# Patient Record
Sex: Female | Born: 1950 | Race: White | Hispanic: No | Marital: Married | Smoking: Never smoker
Health system: Southern US, Community
[De-identification: ages and names within clinical notes are randomized; demographics above are authoritative.]

## PROBLEM LIST (undated history)

## (undated) DIAGNOSIS — A048 Other specified bacterial intestinal infections: Secondary | ICD-10-CM

## (undated) DIAGNOSIS — R413 Other amnesia: Secondary | ICD-10-CM

## (undated) DIAGNOSIS — K449 Diaphragmatic hernia without obstruction or gangrene: Secondary | ICD-10-CM

## (undated) DIAGNOSIS — Z881 Allergy status to other antibiotic agents status: Secondary | ICD-10-CM

## (undated) DIAGNOSIS — E119 Type 2 diabetes mellitus without complications: Secondary | ICD-10-CM

## (undated) DIAGNOSIS — F329 Major depressive disorder, single episode, unspecified: Secondary | ICD-10-CM

## (undated) DIAGNOSIS — M40209 Unspecified kyphosis, site unspecified: Secondary | ICD-10-CM

## (undated) DIAGNOSIS — E785 Hyperlipidemia, unspecified: Secondary | ICD-10-CM

## (undated) DIAGNOSIS — H6500 Acute serous otitis media, unspecified ear: Secondary | ICD-10-CM

## (undated) DIAGNOSIS — F32A Depression, unspecified: Secondary | ICD-10-CM

## (undated) DIAGNOSIS — I10 Essential (primary) hypertension: Secondary | ICD-10-CM

## (undated) DIAGNOSIS — G5603 Carpal tunnel syndrome, bilateral upper limbs: Secondary | ICD-10-CM

## (undated) DIAGNOSIS — K219 Gastro-esophageal reflux disease without esophagitis: Secondary | ICD-10-CM

## (undated) DIAGNOSIS — Z86018 Personal history of other benign neoplasm: Secondary | ICD-10-CM

## (undated) DIAGNOSIS — L309 Dermatitis, unspecified: Secondary | ICD-10-CM

## (undated) DIAGNOSIS — A630 Anogenital (venereal) warts: Secondary | ICD-10-CM

## (undated) DIAGNOSIS — Z780 Asymptomatic menopausal state: Secondary | ICD-10-CM

## (undated) DIAGNOSIS — D3500 Benign neoplasm of unspecified adrenal gland: Secondary | ICD-10-CM

## (undated) DIAGNOSIS — E8881 Metabolic syndrome: Secondary | ICD-10-CM

## (undated) DIAGNOSIS — G4733 Obstructive sleep apnea (adult) (pediatric): Secondary | ICD-10-CM

## (undated) DIAGNOSIS — K589 Irritable bowel syndrome without diarrhea: Secondary | ICD-10-CM

## (undated) DIAGNOSIS — N951 Menopausal and female climacteric states: Secondary | ICD-10-CM

## (undated) DIAGNOSIS — Z8701 Personal history of pneumonia (recurrent): Secondary | ICD-10-CM

## (undated) DIAGNOSIS — K648 Other hemorrhoids: Secondary | ICD-10-CM

## (undated) DIAGNOSIS — J45909 Unspecified asthma, uncomplicated: Secondary | ICD-10-CM

## (undated) DIAGNOSIS — K5790 Diverticulosis of intestine, part unspecified, without perforation or abscess without bleeding: Secondary | ICD-10-CM

## (undated) DIAGNOSIS — F419 Anxiety disorder, unspecified: Secondary | ICD-10-CM

## (undated) DIAGNOSIS — G4730 Sleep apnea, unspecified: Secondary | ICD-10-CM

## (undated) HISTORY — DX: Allergy status to other antibiotic agents: Z88.1

## (undated) HISTORY — DX: Other hemorrhoids: K64.8

## (undated) HISTORY — PX: INCISIONAL HERNIA REPAIR: SHX193

## (undated) HISTORY — DX: Hyperlipidemia, unspecified: E78.5

## (undated) HISTORY — DX: Metabolic syndrome: E88.81

## (undated) HISTORY — DX: Unspecified asthma, uncomplicated: J45.909

## (undated) HISTORY — DX: Personal history of other benign neoplasm: Z86.018

## (undated) HISTORY — DX: Anxiety disorder, unspecified: F41.9

## (undated) HISTORY — DX: Other specified bacterial intestinal infections: A04.8

## (undated) HISTORY — DX: Metabolic syndrome: E88.810

## (undated) HISTORY — DX: Asymptomatic menopausal state: Z78.0

## (undated) HISTORY — DX: Dermatitis, unspecified: L30.9

## (undated) HISTORY — DX: Other amnesia: R41.3

## (undated) HISTORY — PX: CARPAL TUNNEL RELEASE: SHX101

## (undated) HISTORY — DX: Type 2 diabetes mellitus without complications: E11.9

## (undated) HISTORY — DX: Irritable bowel syndrome, unspecified: K58.9

## (undated) HISTORY — DX: Unspecified kyphosis, site unspecified: M40.209

## (undated) HISTORY — DX: Essential (primary) hypertension: I10

## (undated) HISTORY — DX: Diverticulosis of intestine, part unspecified, without perforation or abscess without bleeding: K57.90

## (undated) HISTORY — DX: Anogenital (venereal) warts: A63.0

## (undated) HISTORY — PX: OTHER SURGICAL HISTORY: SHX169

## (undated) HISTORY — DX: Obstructive sleep apnea (adult) (pediatric): G47.33

## (undated) HISTORY — DX: Menopausal and female climacteric states: N95.1

## (undated) HISTORY — DX: Sleep apnea, unspecified: G47.30

## (undated) HISTORY — PX: COLONOSCOPY: SHX174

## (undated) HISTORY — DX: Personal history of pneumonia (recurrent): Z87.01

## (undated) HISTORY — PX: APPENDECTOMY: SHX54

## (undated) HISTORY — DX: Acute serous otitis media, unspecified ear: H65.00

## (undated) HISTORY — DX: Gastro-esophageal reflux disease without esophagitis: K21.9

## (undated) HISTORY — DX: Carpal tunnel syndrome, bilateral upper limbs: G56.03

## (undated) HISTORY — DX: Major depressive disorder, single episode, unspecified: F32.9

## (undated) HISTORY — DX: Diaphragmatic hernia without obstruction or gangrene: K44.9

## (undated) HISTORY — DX: Benign neoplasm of unspecified adrenal gland: D35.00

## (undated) HISTORY — DX: Depression, unspecified: F32.A

---

## 1993-07-13 HISTORY — PX: TOTAL ABDOMINAL HYSTERECTOMY W/ BILATERAL SALPINGOOPHORECTOMY: SHX83

## 1993-11-16 DIAGNOSIS — A048 Other specified bacterial intestinal infections: Secondary | ICD-10-CM

## 1993-11-16 HISTORY — DX: Other specified bacterial intestinal infections: A04.8

## 1995-04-13 HISTORY — PX: OTHER SURGICAL HISTORY: SHX169

## 1995-09-13 HISTORY — PX: OTHER SURGICAL HISTORY: SHX169

## 1999-05-04 ENCOUNTER — Ambulatory Visit (HOSPITAL_COMMUNITY): Admission: RE | Admit: 1999-05-04 | Discharge: 1999-05-04 | Payer: Self-pay | Admitting: Internal Medicine

## 1999-05-04 ENCOUNTER — Encounter: Payer: Self-pay | Admitting: Internal Medicine

## 2000-06-09 ENCOUNTER — Encounter: Admission: RE | Admit: 2000-06-09 | Discharge: 2000-09-07 | Payer: Self-pay | Admitting: *Deleted

## 2000-10-31 ENCOUNTER — Encounter: Payer: Self-pay | Admitting: Family Medicine

## 2000-10-31 ENCOUNTER — Ambulatory Visit (HOSPITAL_COMMUNITY): Admission: RE | Admit: 2000-10-31 | Discharge: 2000-10-31 | Payer: Self-pay | Admitting: Family Medicine

## 2000-12-08 ENCOUNTER — Encounter: Payer: Self-pay | Admitting: Specialist

## 2000-12-13 HISTORY — PX: ROTATOR CUFF REPAIR: SHX139

## 2000-12-14 ENCOUNTER — Observation Stay (HOSPITAL_COMMUNITY): Admission: RE | Admit: 2000-12-14 | Discharge: 2000-12-15 | Payer: Self-pay | Admitting: Specialist

## 2000-12-29 ENCOUNTER — Encounter: Admission: RE | Admit: 2000-12-29 | Discharge: 2001-02-23 | Payer: Self-pay | Admitting: Specialist

## 2002-05-10 ENCOUNTER — Encounter: Payer: Self-pay | Admitting: Family Medicine

## 2002-05-10 ENCOUNTER — Ambulatory Visit (HOSPITAL_COMMUNITY): Admission: RE | Admit: 2002-05-10 | Discharge: 2002-05-10 | Payer: Self-pay | Admitting: Family Medicine

## 2003-02-26 ENCOUNTER — Ambulatory Visit (HOSPITAL_COMMUNITY): Admission: RE | Admit: 2003-02-26 | Discharge: 2003-02-26 | Payer: Self-pay | Admitting: Orthopedic Surgery

## 2003-02-26 ENCOUNTER — Encounter: Payer: Self-pay | Admitting: Orthopedic Surgery

## 2003-05-17 ENCOUNTER — Ambulatory Visit (HOSPITAL_BASED_OUTPATIENT_CLINIC_OR_DEPARTMENT_OTHER): Admission: RE | Admit: 2003-05-17 | Discharge: 2003-05-17 | Payer: Self-pay | Admitting: Family Medicine

## 2003-07-30 ENCOUNTER — Encounter: Payer: Self-pay | Admitting: Family Medicine

## 2003-07-30 ENCOUNTER — Encounter: Admission: RE | Admit: 2003-07-30 | Discharge: 2003-07-30 | Payer: Self-pay | Admitting: Family Medicine

## 2005-12-13 DIAGNOSIS — Z86018 Personal history of other benign neoplasm: Secondary | ICD-10-CM

## 2005-12-13 HISTORY — DX: Personal history of other benign neoplasm: Z86.018

## 2006-05-13 HISTORY — PX: OTHER SURGICAL HISTORY: SHX169

## 2006-07-07 ENCOUNTER — Ambulatory Visit: Payer: Self-pay | Admitting: Gastroenterology

## 2006-07-18 ENCOUNTER — Ambulatory Visit: Payer: Self-pay | Admitting: Gastroenterology

## 2006-09-28 ENCOUNTER — Ambulatory Visit (HOSPITAL_BASED_OUTPATIENT_CLINIC_OR_DEPARTMENT_OTHER): Admission: RE | Admit: 2006-09-28 | Discharge: 2006-09-28 | Payer: Self-pay | Admitting: Orthopedic Surgery

## 2009-08-27 ENCOUNTER — Ambulatory Visit (HOSPITAL_COMMUNITY): Admission: RE | Admit: 2009-08-27 | Discharge: 2009-08-28 | Payer: Self-pay | Admitting: Specialist

## 2009-08-30 HISTORY — PX: OTHER SURGICAL HISTORY: SHX169

## 2009-09-15 ENCOUNTER — Encounter: Admission: RE | Admit: 2009-09-15 | Discharge: 2009-12-11 | Payer: Self-pay | Admitting: Specialist

## 2010-02-06 ENCOUNTER — Encounter: Admission: RE | Admit: 2010-02-06 | Discharge: 2010-02-06 | Payer: Self-pay | Admitting: Orthopedic Surgery

## 2010-02-25 ENCOUNTER — Ambulatory Visit (HOSPITAL_COMMUNITY): Admission: RE | Admit: 2010-02-25 | Discharge: 2010-02-25 | Payer: Self-pay | Admitting: Orthopedic Surgery

## 2011-03-19 LAB — BASIC METABOLIC PANEL
Calcium: 9.2 mg/dL (ref 8.4–10.5)
Chloride: 105 mEq/L (ref 96–112)
Creatinine, Ser: 0.48 mg/dL (ref 0.4–1.2)
GFR calc non Af Amer: >60 mL/min (ref >60)
Sodium: 141 mEq/L (ref 135–145)

## 2011-03-19 LAB — HEPATIC FUNCTION PANEL
Alkaline Phosphatase: 49 U/L (ref 39–117)
Bilirubin, Direct: 0.1 mg/dL (ref 0.0–0.3)
Total Bilirubin: 0.7 mg/dL (ref 0.3–1.2)
Total Protein: 6.4 g/dL (ref 6.0–8.3)

## 2011-03-19 LAB — CBC
HCT: 38.5 % (ref 36.0–46.0)
Hemoglobin: 13.6 g/dL (ref 12.0–15.0)
Platelets: 238 10*3/uL (ref 150–400)
WBC: 7.7 10*3/uL (ref 4.0–10.5)

## 2011-04-21 ENCOUNTER — Encounter: Payer: Self-pay | Admitting: Family Medicine

## 2011-04-30 NOTE — H&P (Signed)
[id number] [facility]  Patient:    Melissa Bowman, Melissa Bowman                        MRN: [id number] Adm. Date:  12/14/00 Attending:  Javier Bowman, M.D. Dictator:   Melissa [facility], P.A.C. CC:         Melissa Bowman, M.D., [facility]  Melissa Bowman, M.D., Melissa [facility]   History and Physical  CHIEF COMPLAINT:  Right shoulder pain.  HISTORY OF PRESENT ILLNESS:  [name] is a 60 year old right-hand-dominant female who was referred to our office by Dr. Montey Bowman and Dr. Monica Bowman from [facility] for evaluation of her right rotator cuff tear.  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 She immediately had acute pain in her right shoulder.  She was seen by Dr. Dimple Bowman.  Her regular doctor is Dr. Christell Bowman.  An MRI was obtained in the office and confirmed a tear.  She was placed on Tylenol No. 3 and Celebrex.  She has had increasing pain with any type of activity with the shoulder.  She was seen in our office on November 08, 2000 by Dr. Javier Bowman, and on inspection, revealed no ecchymosis or deformity of the shoulder.  She did have tenderness in the anterior subacromial region and minimally tender over the [facility].  She had a positive impingement sign.  She had diminished internal rotation and weakness in external rotation.  The MRI demonstrated a full-thickness tear of the supraspinatus tendon.  Predisposing factor appears to be acromial anatomy. The injury is interfering with her activities of daily living.  She has been unable to work and she has not been responsive to Tylenol No. 3 or the Celebrex.  At the time of her appointment, it was felt that she should be of benefit to undergo the open rotator cuff repair.  The risks and benefits of the surgery were discussed including bleeding, infection, damage to neurovascular structures,  no change in symptoms, suboptimal range of motion, protracted need for immobilization and postoperative rehabilitation.  The patient indicated that she understood and agreed to proceed with the surgery.  PAST MEDICAL HISTORY:  Past medical history is significant for: Gastroesophageal reflux disease; asthma, controlled with inhalers; sleep apnea, underwent a uvulectomy and uses a CPAP at night; osteoporosis; hypertension controlled with medications; history of migraine headaches; irritable bowel syndrome; seasonal allergies.  SURGICAL HISTORY:  C-sections in 1982, 1985; appendectomy, 1993; total abdominal hysterectomy, 1994; right herniorrhaphy; uvulectomy.  MEDICATIONS:  Melissa Bowman, Melissa [facility]man, Melissa Bowman, Melissa Bowman, Melissa Bowman, Melissa Bowman, Melissa Bowman.  ALLERGIES:  No known drug allergies.  SOCIAL HISTORY:  The patient is married.  She has two children.  She works at [facility].  Negative for history of alcohol.  Negative history of smoking.  FAMILY HISTORY:  Mother deceased, age 12 -- heart disease, osteoporosis. Father deceased, 9 -- Parkinsons disease.  REVIEW OF SYSTEMS:  Positive HEENT -- has had a recent sinus infection and is currently finishing a round of [facility].  Positive history of sleep apnea, utilizes a CPAP machine at night.  No changes in vision or hearing.  No otorrhea.  No blurriness, teariness.  No rhinorrhea, nasal stuffiness.  No epistaxis.  CARDIOVASCULAR:  Hypertension, well-controlled with medications. No history of heart murmurs.  No angina, palpitations, dyspnea on exertion or orthopnea.  PULMONARY:  Positive  for occasional wheezing due to asthma.  No coughing, sputum, hemoptysis, pneumonia or emphysema.  ENDOCRINE:  No polyuria, polyphagia, polydipsia.  No history of diabetes mellitus or hypothyroid or hyperthyroidism.  GI:  Positive history for internal hemorrhoids and irritable bowel.  No hematemesis.  No hematochezia.   No melena.  No constipation or diarrhea.  GENITOURINARY:  No history of dysuria, frequency, hesitancy, urgency.  No nocturia or incontinence.  No stones.  No recent infections.  CNS:  Positive history of occasional migraine headaches. No vertigo.  No history of seizures or stroke.  MUSCULOSKELETAL:  No history of muscle weakness.  Does have decreased range of motion to the right shoulder, as stated above.  No redness or swelling.  No history of gout.  PHYSICAL EXAMINATION  VITALS:  Blood pressure 110/80.  Pulse is 80.  Respirations 20.  GENERAL:  [age]-year-old white female, well-developed, well-nourished, in no acute distress.  HEENT:  Oropharynx is clear, nonerythematous.  Absent uvula noted.  PERRLA.  NECK:  Supple.  Negative for carotid bruits.  CHEST:  Lungs are clear to auscultation bilaterally.  No wheezes, rhonchi or rales noted.  BREASTS:  Not pertinent to present illness.  GENITOURINARY:  Not pertinent to present illness.  HEART:  S1, S2.  Negative for murmur, rub or gallop.  Heart is regular in rate and rhythm.  ABDOMEN:  Soft and supple, nontender.  Positive bowel sounds.  Abdomen is somewhat round.  EXTREMITIES:  Radial pulses 2+ and symmetrical.  For physical exam of the right shoulder, please see history of present illness.  SKIN:  Acyanotic with no lesions noted.  No clubbing noted.  No nail pitting or stippling noticed.  LABORATORY AND X-RAY FINDINGS:  Preop labs are pending at this time.  IMPRESSION 1. Right shoulder rotator cuff tear. 2. Reflux. 3. Asthma. 4. Sleep apnea. 5. Osteoporosis. 6. Hypertension. 7. History of migraine headaches. 8. Irritable bowel.  PLAN:  Patient is scheduled for open right rotator cuff repair with Dr. Jene Bowman on December 14, 2000 at [facility]. DD:  12/08/00 TD:  12/08/00 Job: [id number]

## 2011-04-30 NOTE — Op Note (Signed)
[facility]  Patient:    Melissa Bowman, Melissa Bowman                        MRN: [id number] Proc. Date: 12/14/00 Adm. Date:  [id number] Attending:  Pierce Crane                           Operative Report  PREOPERATIVE DIAGNOSIS:  Right rotator cuff tear, impingement syndrome.  POSTOPERATIVE DIAGNOSIS:  Right rotator cuff tear, impingement syndrome.  PROCEDURE:  Open rotator cuff repair, acromioplasty, synovectomy.  ANESTHESIA:  General.  ASSISTANT:  Della Goo, P.A.  BRIEF HISTORY AND INDICATIONS:  A 60 year old with acute rotator cuff tear following a work-related injury.  An MRI indicated a tear.  Operative intervention was indicated for decompression and repair of the rotator cuff. Risks and benefits discussed including bleeding, infection, damage to vascular structures, recurrent tear, and suboptimal range of motion, etc.  TECHNIQUE:  The patient is in supine position.  After the induction of adequate anesthesia, 1 gram of Kefzol for antimicrobial prophylaxis, the right shoulder and upper extremity was prepped and draped in the usual sterile fashion.  Surgical marker was utilized to delineate the acromion and clavicle. Incision was made over the anterior aspect of the acromion in ______ lines. Subcutaneous tissue was dissected and electrocautery utilized to achieve hemostasis.  Marcaine 0.25% with epinephrine infiltrated in the subcutaneous tissue.  Raphe between the anterior and lateral heads of the deltoid was identified and divided, superiorly periosteally elevated from the acromion laterally and medially to the [facility] joint.  The [facility] ligament was detached and excised.  Raphe was developed.  Bursa was noted and excised.  Copiously irrigated.  A large, nearly full thickness tear of the rotator cuff was noted in the supraspinatus tendon.  This measured approximately 1.5 cm in length. The edges were debrided to good bleeding tissue.  There was noted  attachment with insertion, approximately 1/2 cm from this insertion.  Longitudinal direction.  The rotator cuff was repaired with #1 Vicryl interrupted figure-of-eight sutures.  A total of six were utilized.  Wound copiously irrigated.  Prior to this, acromioplasty was performed utilizing oscillating saw, removing approximately 3 mm of inferior surface and 3 mm of the anterior surface.  This removed any residual impingement.  Next, wound copiously irrigated.  Internal and external rotation with no evidence of additional tear.  Excellent repair was noted.  No tension noted. Next, the raphe was repaired with #1 Vicryl interrupted figure-of-eight sutures.  The deltoid was repaired to the acromion with #1 Vicryl interrupted figure-of-eight sutures.  Excellent repair of the raphe of the deltoid was noted.  Good range without tensions on the wound site.  Prior to this, the shoulder was ranged.  There was no residual tension.  Next, subcutaneous tissue reapproximated with 2-0 Vicryl simple sutures.  Skin was reapproximated with 3-0 subcuticular and reinforced with Steri-Strips and sterile dressing applied.  Abduction pillow in place.  The patient was extubated without difficulty and transported to recovery room in satisfactory condition.  The patient tolerated the procedure well without complications. DD:  12/14/00 TD:  12/14/00 Job: [id number]

## 2011-04-30 NOTE — Op Note (Signed)
[name], Melissa Bowman               ACCOUNT NO.:  [id number]   MEDICAL RECORD NO.:  [id number]          PATIENT TYPE:  AMB   LOCATION:  DSC                          FACILITY:  [facility]   PHYSICIAN:  Mila Homer. Sherlean Foot, M.D. DATE OF BIRTH:  [birth year]   DATE OF PROCEDURE:  09/28/2006  DATE OF DISCHARGE:                                 OPERATIVE REPORT   SURGEON:  Mila Homer. Sherlean Foot, M.D.   ASSISTANT:  None.   PREOPERATIVE DIAGNOSES:  Right knee osteoarthritis, medial meniscus tear.   POSTOPERATIVE DIAGNOSES:  Right knee osteoarthritis, medial meniscus tear.   PROCEDURES:  Right knee arthroscopy with partial medial meniscectomy,  chondroplasty in the medial and patellofemoral compartments.   ANESTHESIA:  MAC, plus preoperative knee block.   NOTE:  Informed consent was obtained.   DESCRIPTION OF PROCEDURES:  The patient was laid supine and administered MAC  anesthesia.  The right leg was prepped and draped in the usual sterile  fashion.  Inferolateral and inferomedial portals were created with a #11  blade and a blunt trocar and cannula.  Diagnostic arthroscopy revealed  significant grade 3 and 4 chondromalacia over the entire trochlea, and grade  3 over most of the lateral facet of the patella.  The medial compartment  showed grade 2 changes throughout, even grade 3 on the tibia, and a mildly  recurrent undersurface tear in the posterior horn of the meniscus.  Because  this was symptomatic, I elected to use the straight and upbiting basket  forceps to perform an aggressive posterior-horn partial medial meniscectomy.  The debris was removed with the [facility] shaver, which was used to  further debride it back to a stable rim of tissue.  The ACL and the PCL were  normal.  The lateral compartment was pristine.  I then lavaged the joint,  closed it with 4-0 nylon suture, and dressed with Xeroform, dressing  sponges, sterile Webril and an Ace wrap.     ______________________________  Mila Homer. Sherlean Foot, M.D.     SDL/MEDQ  D:  09/28/2006  T:  09/28/2006  Job:  [id number]

## 2011-07-22 ENCOUNTER — Encounter: Payer: Self-pay | Admitting: Gastroenterology

## 2011-08-04 ENCOUNTER — Telehealth: Payer: Self-pay | Admitting: *Deleted

## 2011-08-04 NOTE — Telephone Encounter (Signed)
Unable to reach pt

## 2011-08-18 ENCOUNTER — Other Ambulatory Visit: Payer: Self-pay | Admitting: Gastroenterology

## 2011-08-26 ENCOUNTER — Encounter: Payer: Self-pay | Admitting: *Deleted

## 2011-08-27 ENCOUNTER — Encounter: Payer: Self-pay | Admitting: Gastroenterology

## 2011-08-27 ENCOUNTER — Ambulatory Visit (INDEPENDENT_AMBULATORY_CARE_PROVIDER_SITE_OTHER): Payer: 59 | Admitting: Gastroenterology

## 2011-08-27 DIAGNOSIS — K625 Hemorrhage of anus and rectum: Secondary | ICD-10-CM | POA: Insufficient documentation

## 2011-08-27 DIAGNOSIS — G4737 Central sleep apnea in conditions classified elsewhere: Secondary | ICD-10-CM | POA: Insufficient documentation

## 2011-08-27 DIAGNOSIS — F419 Anxiety disorder, unspecified: Secondary | ICD-10-CM

## 2011-08-27 DIAGNOSIS — R1310 Dysphagia, unspecified: Secondary | ICD-10-CM | POA: Insufficient documentation

## 2011-08-27 DIAGNOSIS — F341 Dysthymic disorder: Secondary | ICD-10-CM

## 2011-08-27 DIAGNOSIS — R109 Unspecified abdominal pain: Secondary | ICD-10-CM | POA: Insufficient documentation

## 2011-08-27 DIAGNOSIS — R1314 Dysphagia, pharyngoesophageal phase: Secondary | ICD-10-CM

## 2011-08-27 DIAGNOSIS — K219 Gastro-esophageal reflux disease without esophagitis: Secondary | ICD-10-CM

## 2011-08-27 DIAGNOSIS — K648 Other hemorrhoids: Secondary | ICD-10-CM | POA: Insufficient documentation

## 2011-08-27 DIAGNOSIS — K921 Melena: Secondary | ICD-10-CM | POA: Insufficient documentation

## 2011-08-27 DIAGNOSIS — G4730 Sleep apnea, unspecified: Secondary | ICD-10-CM

## 2011-08-27 DIAGNOSIS — E669 Obesity, unspecified: Secondary | ICD-10-CM | POA: Insufficient documentation

## 2011-08-27 HISTORY — DX: Anxiety disorder, unspecified: F41.9

## 2011-08-27 MED ORDER — PANTOPRAZOLE SODIUM 40 MG PO TBEC: 40.0000 mg | DELAYED_RELEASE_TABLET | Freq: Every day | ORAL | Status: DC

## 2011-08-27 MED ORDER — PEG-KCL-NACL-NASULF-NA ASC-C 100 G PO SOLR: 1.0000 | Freq: Once | ORAL | Status: DC

## 2011-08-27 NOTE — Progress Notes (Signed)
History of Present Illness:  This is a 60 year old Caucasian female with chronic acid reflux, diverticulosis coli, previous perianal warts treated with podoffolyn topically by Dr. Kendrick Ranch, chronic obesity, and chronic depression. She has had multiple surgical procedures including hysterectomy, appendectomy, total orthopedic procedures, cesarean sections, and she suffers from sleep apnea. Currently she describes worsening acid reflux with intermittent solid food dysphagia, also one month of sharp right upper quadrant pain without nausea vomiting or other specific hepatobiliary complaints. She has mild constipation with intermittent bright red blood per rectum. Last colonoscopy was in 2007. Apparently she has regular liver function tests which have been unremarkable. Last upper abdominal ultrasound exam was 10 years ago. Denies abuse of cigarettes, alcohol, or NSAIDs.  Other problems have included chronic anxiety and depression. She denies a specific food intolerances. She does have mild asthmatic bronchitis and uses when necessary inhalers. She is on multiple antidepressant medications. In the history is remarkable for ovarian cancer but no known gastrointestinal malignancies.   I have reviewed this patient's present history, medical and surgical past history, allergies and medications.     ROS: The remainder of the 10 point ROS is negative... she does complain of chronic cough without sputum production at this time. She has had several episodes of severe pneumonia. Anxiety and depression seems well controlled at this time. She denies other cardiovascular or pulmonary complaints.     Physical Exam: General well developed well nourished patient in no acute distress, appearing her stated age..obese Eyes PERRLA, no icterus, fundoscopic exam per opthamologist Skin no lesions noted Neck supple, no adenopathy, no thyroid enlargement, no tenderness Chest clear to percussion and auscultation Heart no  significant murmurs, gallops or rubs noted Abdomen no hepatosplenomegaly masses or tenderness, BS normal.  Rectal inspection normal no fissures, or fistulae noted.  No masses or tenderness on digital exam. Stool available for guaiac testing. There is a anterior skin tag noted but no fissure or fistula. There is no evidence of perianal warts. Extremities no acute joint lesions, edema, phlebitis or evidence of cellulitis. Neurologic patient oriented x 3, cranial nerves intact, no focal neurologic deficits noted. Psychological mental status normal and normal affect.  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 I've scheduled an endoscopy and possible dilatation. I have replaced her omeprazole with Dexilant 60 mg every morning before breakfast. Standard antireflux maneuvers also reviewed. Because of her rectal bleeding, we'll schedule followup colonoscopy with propofol sedation because of her multiple psychotropic medications, obesity, and rather severe sleep apnea. I suspect she has fatty infiltration of her liver with capsular distention as the etiology of her right upper quadrant pain although I cannot appreciate hepatomegaly on physical exam. We will ask Dr. Christell Constant to send [place] a copy of her labs. I have scheduled followup upper abdominal ultrasound exam also. Otherwise she is to continue her medications as listed and reviewed.  No diagnosis found.

## 2011-08-27 NOTE — Patient Instructions (Signed)
Your procedure has been scheduled for 09/15/2011, please follow the seperate instructions.  Your prescription(s) have been sent to you pharmacy, Movi prep and Protonix.  Your abdominal ultrasound is scheduled for 09/03/2011 arrive at [facility] at 8:15am, please have nothing to eat or drink after midnight.

## 2011-08-30 ENCOUNTER — Encounter: Payer: Self-pay | Admitting: Gastroenterology

## 2011-09-03 ENCOUNTER — Ambulatory Visit (HOSPITAL_COMMUNITY)
Admission: RE | Admit: 2011-09-03 | Discharge: 2011-09-03 | Disposition: A | Discharge status: Home / Self Care | Payer: 59 | Source: Ambulatory Visit | Attending: Gastroenterology | Admitting: Gastroenterology

## 2011-09-03 DIAGNOSIS — Q619 Cystic kidney disease, unspecified: Secondary | ICD-10-CM | POA: Insufficient documentation

## 2011-09-03 DIAGNOSIS — Z9089 Acquired absence of other organs: Secondary | ICD-10-CM | POA: Insufficient documentation

## 2011-09-03 DIAGNOSIS — R1011 Right upper quadrant pain: Secondary | ICD-10-CM | POA: Insufficient documentation

## 2011-09-10 ENCOUNTER — Telehealth: Payer: Self-pay | Admitting: Gastroenterology

## 2011-09-10 NOTE — Telephone Encounter (Signed)
Notified pt that her U/S was normal; pt stated understanding.

## 2011-09-15 ENCOUNTER — Ambulatory Visit (AMBULATORY_SURGERY_CENTER): Payer: 59 | Admitting: Gastroenterology

## 2011-09-15 ENCOUNTER — Encounter: Payer: Self-pay | Admitting: Gastroenterology

## 2011-09-15 DIAGNOSIS — K2970 Gastritis, unspecified, without bleeding: Secondary | ICD-10-CM

## 2011-09-15 DIAGNOSIS — R1314 Dysphagia, pharyngoesophageal phase: Secondary | ICD-10-CM

## 2011-09-15 DIAGNOSIS — K219 Gastro-esophageal reflux disease without esophagitis: Secondary | ICD-10-CM

## 2011-09-15 DIAGNOSIS — G8929 Other chronic pain: Secondary | ICD-10-CM

## 2011-09-15 DIAGNOSIS — R109 Unspecified abdominal pain: Secondary | ICD-10-CM

## 2011-09-15 DIAGNOSIS — K5730 Diverticulosis of large intestine without perforation or abscess without bleeding: Secondary | ICD-10-CM | POA: Insufficient documentation

## 2011-09-15 DIAGNOSIS — K319 Disease of stomach and duodenum, unspecified: Secondary | ICD-10-CM

## 2011-09-15 DIAGNOSIS — R1011 Right upper quadrant pain: Secondary | ICD-10-CM | POA: Insufficient documentation

## 2011-09-15 DIAGNOSIS — K760 Fatty (change of) liver, not elsewhere classified: Secondary | ICD-10-CM | POA: Insufficient documentation

## 2011-09-15 DIAGNOSIS — K625 Hemorrhage of anus and rectum: Secondary | ICD-10-CM | POA: Insufficient documentation

## 2011-09-15 DIAGNOSIS — Z1211 Encounter for screening for malignant neoplasm of colon: Secondary | ICD-10-CM

## 2011-09-15 MED ORDER — SODIUM CHLORIDE 0.9 % IV SOLN: 500.0000 mL | INTRAVENOUS | Status: DC

## 2011-09-15 NOTE — Patient Instructions (Signed)
Please review discharge instructions (blue and green sheets)  Follow high fiber diet- see handout  Take Metamucil or Benefiber daily- buy over the counter and follow instructions  Await biopsy results  Continue daily PPI

## 2011-09-15 NOTE — Progress Notes (Signed)
0945- Pt's abd distended, c/o abd pain as an "8/10."  Pt on left side, knees to chest, passing air.  Pt states relief with air passing  0951- Passing air, states pain is a "5/10" now  1000- Pt to right side, passing air, states pain is a "3/10."  1008- Pt up to bathroom to pass air on the toilet, husband with her  1015 Pt passed large amt of air in the bathroom, no further c/o pain

## 2011-09-16 ENCOUNTER — Telehealth: Payer: Self-pay | Admitting: *Deleted

## 2011-09-16 NOTE — Telephone Encounter (Signed)

## 2011-09-17 DIAGNOSIS — K2970 Gastritis, unspecified, without bleeding: Secondary | ICD-10-CM

## 2011-09-17 DIAGNOSIS — K2990 Gastroduodenitis, unspecified, without bleeding: Secondary | ICD-10-CM

## 2011-09-17 LAB — HELICOBACTER PYLORI SCREEN-BIOPSY: UREASE: NEGATIVE

## 2011-09-21 ENCOUNTER — Encounter: Payer: Self-pay | Admitting: Gastroenterology

## 2012-03-14 ENCOUNTER — Institutional Professional Consult (permissible substitution): Payer: 59 | Admitting: Internal Medicine

## 2012-06-05 ENCOUNTER — Other Ambulatory Visit: Payer: Self-pay | Admitting: Family Medicine

## 2012-06-06 ENCOUNTER — Ambulatory Visit (HOSPITAL_COMMUNITY)
Admission: RE | Admit: 2012-06-06 | Discharge: 2012-06-06 | Disposition: A | Discharge status: Home / Self Care | Payer: 59 | Source: Ambulatory Visit | Attending: Family Medicine | Admitting: Family Medicine

## 2012-06-06 ENCOUNTER — Other Ambulatory Visit (HOSPITAL_COMMUNITY): Payer: Self-pay | Admitting: Family Medicine

## 2012-06-06 DIAGNOSIS — R748 Abnormal levels of other serum enzymes: Secondary | ICD-10-CM | POA: Insufficient documentation

## 2012-06-06 DIAGNOSIS — R160 Hepatomegaly, not elsewhere classified: Secondary | ICD-10-CM | POA: Insufficient documentation

## 2012-06-06 DIAGNOSIS — K7689 Other specified diseases of liver: Secondary | ICD-10-CM | POA: Insufficient documentation

## 2012-06-06 DIAGNOSIS — R1011 Right upper quadrant pain: Secondary | ICD-10-CM | POA: Insufficient documentation

## 2012-09-21 ENCOUNTER — Other Ambulatory Visit: Payer: Self-pay | Admitting: Family Medicine

## 2012-09-21 DIAGNOSIS — R160 Hepatomegaly, not elsewhere classified: Secondary | ICD-10-CM

## 2012-09-26 ENCOUNTER — Ambulatory Visit (HOSPITAL_COMMUNITY)
Admission: RE | Admit: 2012-09-26 | Discharge: 2012-09-26 | Disposition: A | Discharge status: Home / Self Care | Payer: 59 | Source: Ambulatory Visit | Attending: Family Medicine | Admitting: Family Medicine

## 2012-09-26 DIAGNOSIS — R160 Hepatomegaly, not elsewhere classified: Secondary | ICD-10-CM

## 2012-09-26 DIAGNOSIS — K7689 Other specified diseases of liver: Secondary | ICD-10-CM | POA: Insufficient documentation

## 2012-09-26 DIAGNOSIS — D3500 Benign neoplasm of unspecified adrenal gland: Secondary | ICD-10-CM | POA: Insufficient documentation

## 2012-09-26 MED ORDER — IOHEXOL 300 MG/ML  SOLN
100.0000 mL | Freq: Once | INTRAMUSCULAR | Status: AC | PRN
  Administered 2012-09-26: 100 mL via INTRAVENOUS

## 2013-03-08 ENCOUNTER — Encounter: Payer: Self-pay | Admitting: *Deleted

## 2013-03-08 ENCOUNTER — Other Ambulatory Visit: Payer: Self-pay | Admitting: *Deleted

## 2013-03-08 DIAGNOSIS — M810 Age-related osteoporosis without current pathological fracture: Secondary | ICD-10-CM

## 2013-03-26 ENCOUNTER — Other Ambulatory Visit: Payer: Self-pay | Admitting: *Deleted

## 2013-03-26 MED ORDER — OMEPRAZOLE 20 MG PO CPDR: 40.0000 mg | DELAYED_RELEASE_CAPSULE | Freq: Every day | ORAL | Status: DC

## 2013-03-26 MED ORDER — MONTELUKAST SODIUM 10 MG PO TABS: 10.0000 mg | ORAL_TABLET | Freq: Every day | ORAL | Status: DC

## 2013-05-09 ENCOUNTER — Other Ambulatory Visit (INDEPENDENT_AMBULATORY_CARE_PROVIDER_SITE_OTHER): Payer: PRIVATE HEALTH INSURANCE

## 2013-05-09 DIAGNOSIS — IMO0001 Reserved for inherently not codable concepts without codable children: Secondary | ICD-10-CM

## 2013-05-09 DIAGNOSIS — I10 Essential (primary) hypertension: Secondary | ICD-10-CM

## 2013-05-09 DIAGNOSIS — E559 Vitamin D deficiency, unspecified: Secondary | ICD-10-CM

## 2013-05-09 DIAGNOSIS — E785 Hyperlipidemia, unspecified: Secondary | ICD-10-CM

## 2013-05-09 DIAGNOSIS — R5381 Other malaise: Secondary | ICD-10-CM

## 2013-05-09 DIAGNOSIS — R350 Frequency of micturition: Secondary | ICD-10-CM

## 2013-05-09 LAB — POCT UA - MICROSCOPIC ONLY
Crystals, Ur, HPF, POC: NEGATIVE
WBC, Ur, HPF, POC: NEGATIVE
Yeast, UA: NEGATIVE

## 2013-05-09 LAB — POCT CBC
Hemoglobin: 13.7 g/dL (ref 12.2–16.2)
Lymph, poc: 2.1 (ref 0.6–3.4)
MCH, POC: 32.4 pg — AB (ref 27–31.2)
MCHC: 34.4 g/dL (ref 31.8–35.4)
MPV: 8.2 fL (ref 0–99.8)
POC Granulocyte: 2.4 (ref 2–6.9)
POC LYMPH PERCENT: 43.4 %L (ref 10–50)
Platelet Count, POC: 225 10*3/uL (ref 142–424)
RBC: 4.2 M/uL (ref 4.04–5.48)

## 2013-05-09 LAB — HEPATIC FUNCTION PANEL
Albumin: 4.5 g/dL (ref 3.5–5.2)
Bilirubin, Direct: 0.1 mg/dL (ref 0.0–0.3)
Total Bilirubin: 0.7 mg/dL (ref 0.3–1.2)

## 2013-05-09 LAB — BASIC METABOLIC PANEL WITH GFR
Calcium: 9.2 mg/dL (ref 8.4–10.5)
Creat: 0.54 mg/dL (ref 0.50–1.10)
GFR, Est Non African American: >89 mL/min
Sodium: 141 mEq/L (ref 135–145)

## 2013-05-09 LAB — POCT URINALYSIS DIPSTICK
Bilirubin, UA: NEGATIVE
Blood, UA: NEGATIVE
Nitrite, UA: NEGATIVE
Protein, UA: NEGATIVE
pH, UA: 7.5

## 2013-05-09 LAB — THYROID PANEL WITH TSH: T4, Total: 13.1 ug/dL — ABNORMAL HIGH (ref 5.0–12.5)

## 2013-05-10 LAB — NMR LIPOPROFILE WITH LIPIDS
Cholesterol, Total: 208 mg/dL — ABNORMAL HIGH (ref <200)
HDL Size: 8.5 nm — ABNORMAL LOW (ref >=9.2)
HDL-C: 34 mg/dL — ABNORMAL LOW (ref >=40)
LDL (calc): 109 mg/dL — ABNORMAL HIGH (ref <100)
LDL Particle Number: 2878 nmol/L — ABNORMAL HIGH (ref <1000)
LP-IR Score: 76 — ABNORMAL HIGH (ref <=45)
Triglycerides: 325 mg/dL — ABNORMAL HIGH (ref <150)
VLDL Size: 46.5 nm (ref <=46.6)

## 2013-05-10 LAB — VITAMIN D 25 HYDROXY (VIT D DEFICIENCY, FRACTURES): Vit D, 25-Hydroxy: 60 ng/mL (ref 30–89)

## 2013-05-14 ENCOUNTER — Ambulatory Visit (INDEPENDENT_AMBULATORY_CARE_PROVIDER_SITE_OTHER): Payer: PRIVATE HEALTH INSURANCE | Admitting: Family Medicine

## 2013-05-14 ENCOUNTER — Telehealth: Payer: Self-pay | Admitting: *Deleted

## 2013-05-14 ENCOUNTER — Encounter: Payer: Self-pay | Admitting: Family Medicine

## 2013-05-14 VITALS — BP 147/82 | HR 75 | Temp 98.9°F | Ht 60.0 in | Wt 212.8 lb

## 2013-05-14 DIAGNOSIS — R51 Headache: Secondary | ICD-10-CM

## 2013-05-14 DIAGNOSIS — E559 Vitamin D deficiency, unspecified: Secondary | ICD-10-CM | POA: Insufficient documentation

## 2013-05-14 DIAGNOSIS — E785 Hyperlipidemia, unspecified: Secondary | ICD-10-CM | POA: Insufficient documentation

## 2013-05-14 DIAGNOSIS — F329 Major depressive disorder, single episode, unspecified: Secondary | ICD-10-CM | POA: Insufficient documentation

## 2013-05-14 DIAGNOSIS — H9193 Unspecified hearing loss, bilateral: Secondary | ICD-10-CM

## 2013-05-14 DIAGNOSIS — K219 Gastro-esophageal reflux disease without esophagitis: Secondary | ICD-10-CM

## 2013-05-14 DIAGNOSIS — H9190 Unspecified hearing loss, unspecified ear: Secondary | ICD-10-CM

## 2013-05-14 DIAGNOSIS — M79609 Pain in unspecified limb: Secondary | ICD-10-CM

## 2013-05-14 DIAGNOSIS — J309 Allergic rhinitis, unspecified: Secondary | ICD-10-CM | POA: Insufficient documentation

## 2013-05-14 DIAGNOSIS — F3289 Other specified depressive episodes: Secondary | ICD-10-CM

## 2013-05-14 DIAGNOSIS — M79672 Pain in left foot: Secondary | ICD-10-CM

## 2013-05-14 DIAGNOSIS — M899 Disorder of bone, unspecified: Secondary | ICD-10-CM

## 2013-05-14 DIAGNOSIS — Z888 Allergy status to other drugs, medicaments and biological substances status: Secondary | ICD-10-CM

## 2013-05-14 DIAGNOSIS — I10 Essential (primary) hypertension: Secondary | ICD-10-CM

## 2013-05-14 DIAGNOSIS — R7309 Other abnormal glucose: Secondary | ICD-10-CM

## 2013-05-14 DIAGNOSIS — M8580 Other specified disorders of bone density and structure, unspecified site: Secondary | ICD-10-CM

## 2013-05-14 DIAGNOSIS — Z789 Other specified health status: Secondary | ICD-10-CM

## 2013-05-14 NOTE — Telephone Encounter (Signed)
Message copied by Bearl Mulberry on Mon May 14, 2013  5:54 PM ------      Message from: Ernestina Penna      Created: Mon May 14, 2013  2:23 PM       The hemoglobin A1c shows blood sugar control to be adequate at 6.7%, ideally less than 6.5% would be better----continue aggressive therapeutic lifestyle changes       ------

## 2013-05-14 NOTE — Progress Notes (Signed)
Subjective:    Patient ID: Melissa Bowman, female    DOB: [birth year], 62 y.o.   MRN: [id number]  HPI This patient presents for recheck of multiple medical problems. No one accompanies the patient today.  Patient Active Problem List   Diagnosis Date Noted  . Hypertension 05/14/2013  . Hyperlipidemia 05/14/2013  . Depression 05/14/2013  . Allergic rhinitis 05/14/2013  . Vitamin D deficiency 05/14/2013  . Hemorrhage of rectum and anus 09/15/2011  . Dysphagia, pharyngoesophageal phase 09/15/2011  . NAFLD (nonalcoholic fatty liver disease) 40/34/7425  . Chronic RUQ pain 09/15/2011  . Diverticulosis of colon (without mention of hemorrhage) 09/15/2011  . BRBPR (bright red blood per rectum) 08/27/2011  . Internal hemorrhoids without mention of complication 08/27/2011  . Esophageal reflux 08/27/2011  . Abdominal pain 08/27/2011  . Esophageal dysphagia 08/27/2011  . Obesity 08/27/2011  . Anxiety and depression 08/27/2011  . 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  The allergies, current medications, past medical history, surgical history, family and social history are reviewed.  Immunizations reviewed.  Health maintenance reviewed.  The following items are outstanding: None.      Review of Systems  Constitutional: Negative.   HENT: Positive for congestion, postnasal drip and sinus pressure. Negative for sore throat.   Eyes: Positive for redness (due to allergies) and itching (due to allergies).  Respiratory: Positive for cough (slightly prod, thick white) and wheezing. Negative for shortness of breath.   Cardiovascular: Negative.   Gastrointestinal: Negative.   Endocrine: Negative.   Genitourinary: Negative.   Musculoskeletal: Positive for arthralgias (L ankle).  Allergic/Immunologic: Positive for environmental allergies (seasonal).  Neurological: Positive for headaches (2-3 x a week).  Psychiatric/Behavioral: The  patient is nervous/anxious.        Objective:   Physical Exam  BP 147/82  Pulse 75  Temp(Src) 98.9 F (37.2 C) (Oral)  Ht 5' (1.524 m)  Wt 212 lb 12.8 oz (96.525 kg)  BMI 41.56 kg/m2  The patient appeared well nourished and normally developed, alert and oriented to time and place. Speech, behavior and judgement appear normal. Vital signs as documented.  Head exam is unremarkable. No scleral icterus or pallor noted. Minimal nasal congestion bilaterally. Mouth and throat and ears appeared normal.  Neck is without jugular venous distension, thyromegally, or carotid bruits. Carotid upstrokes are brisk bilaterally. No cervical adenopathy. Some slight left lateral orbital tenderness. Some posterior occipital tenderness. Lungs are clear anteriorly and posteriorly to auscultation. Normal respiratory effort. Cardiac exam reveals regular rate and rhythm at 72 per minute. First and second heart sounds normal.  No  rubs or gallops. There was a grade 2/6 systolic ejection murmur. Abdominal exam reveals normal bowl sounds, no masses, no organomegaly and no aortic enlargement. No inguinal adenopathy. She does have epigastric tenderness and  Obesity.  Extremities are nonedematous and both  pedal pulses are normal. There was swelling in the left Achilles tendon that was tender. Skin without pallor or jaundice.  Warm and dry, without rash. Neurologic exam reveals normal deep tendon reflexes and normal sensation.          Assessment & Plan:    1. Hypertension Continue current medications  2. Hyperlipidemia Appointment with clinical pharmacist  3. Depression Continue current medications  4. Allergic rhinitis Continue current medications  5. Vitamin D deficiency  6. GERD (gastroesophageal reflux disease)  7. Osteopenia DEXA scan due in December of 2014  8. Sugar blood level increased -  POCT glycosylated hemoglobin (Hb A1C)  9. Left temporal headache - Sedimentation rate  10.  Decreased hearing, bilateral Appointment with audiologist  11. Heel pain, left Appointment with orthopedist  12. Statin intolerance  Patient Instructions  Fall precautions discussed Continue current meds and therapeutic lifestyle changes Be careful about lifting twisting and turning Take extra strength Tylenol if needed for pain We will arrange an appointment with the orthopedist, the audiologist, and the clinical pharmacists

## 2013-05-14 NOTE — Addendum Note (Signed)
Addended by: Bearl Mulberry on: 05/14/2013 04:02 PM   Modules accepted: Orders

## 2013-05-14 NOTE — Patient Instructions (Addendum)
Fall precautions discussed Continue current meds and therapeutic lifestyle changes Be careful about lifting twisting and turning Take extra strength Tylenol if needed for pain We will arrange an appointment with the orthopedist, the audiologist, and the clinical pharmacists

## 2013-05-14 NOTE — Telephone Encounter (Signed)
Pt notified of results

## 2013-05-20 ENCOUNTER — Telehealth: Payer: Self-pay | Admitting: Family Medicine

## 2013-05-20 NOTE — Telephone Encounter (Signed)
Patient called.having a recurrence of UTI and it is bad and cannot wait until tomorrow. She has been taking azostandard since yesterday., recently saw pharmacist. And  Saw Dr Christell Constant last month. Claims that she has a tendency to have UTIs and was told to call when she needs an antibiotic. No UTI in records in [facility]. Called pharmacy([facility]) and patient treated twice this year so far and end of last year. Authorize refill of  cipro 500 mg twice a day for 7 days. Hutton Pellicane P. Modesto Charon, M.D.

## 2013-05-23 ENCOUNTER — Other Ambulatory Visit: Payer: Self-pay

## 2013-05-23 ENCOUNTER — Ambulatory Visit: Payer: Self-pay

## 2013-05-24 ENCOUNTER — Other Ambulatory Visit: Payer: Self-pay | Admitting: Orthopedic Surgery

## 2013-05-24 ENCOUNTER — Ambulatory Visit (INDEPENDENT_AMBULATORY_CARE_PROVIDER_SITE_OTHER): Payer: PRIVATE HEALTH INSURANCE

## 2013-05-24 DIAGNOSIS — R52 Pain, unspecified: Secondary | ICD-10-CM

## 2013-07-06 ENCOUNTER — Encounter: Payer: Self-pay | Admitting: Family Medicine

## 2013-07-06 ENCOUNTER — Ambulatory Visit (INDEPENDENT_AMBULATORY_CARE_PROVIDER_SITE_OTHER): Payer: 59

## 2013-07-06 ENCOUNTER — Ambulatory Visit (INDEPENDENT_AMBULATORY_CARE_PROVIDER_SITE_OTHER): Payer: 59 | Admitting: Family Medicine

## 2013-07-06 VITALS — BP 133/73 | HR 79 | Temp 98.7°F | Ht 60.0 in | Wt 213.6 lb

## 2013-07-06 DIAGNOSIS — M546 Pain in thoracic spine: Secondary | ICD-10-CM

## 2013-07-06 DIAGNOSIS — W19XXXA Unspecified fall, initial encounter: Secondary | ICD-10-CM

## 2013-07-06 DIAGNOSIS — S139XXA Sprain of joints and ligaments of unspecified parts of neck, initial encounter: Secondary | ICD-10-CM

## 2013-07-06 DIAGNOSIS — S161XXA Strain of muscle, fascia and tendon at neck level, initial encounter: Secondary | ICD-10-CM

## 2013-07-06 MED ORDER — CYCLOBENZAPRINE HCL 10 MG PO TABS: 10.0000 mg | ORAL_TABLET | Freq: Three times a day (TID) | ORAL | Status: DC | PRN

## 2013-07-06 MED ORDER — PREDNISONE 50 MG PO TABS: ORAL_TABLET | ORAL | Status: DC

## 2013-07-06 NOTE — Progress Notes (Signed)
Subjective:    Patient ID: Melissa [facility]man, female    DOB: 1951/03/06, 62 y.o.   MRN: [id number]  HPI Neck and upper back pain x 3 weeks  Pt accidentally fell 3 weeks ago.  Struck top of head.  Has had had persistent neck and upper back pain since this point No numbness or tingling No radicular sxs.  Pain predominantly in R upper back and neck.  Worse with neck movement and neck flexion.     Review of Systems  All other systems reviewed and are negative.       Objective:   Physical Exam  Constitutional:  Obese    HENT:  Head: Normocephalic and atraumatic.  Eyes: Pupils are equal, round, and reactive to light.  Neck: Normal range of motion.  Cardiovascular: Normal rate and regular rhythm.   Pulmonary/Chest: Effort normal and breath sounds normal.  Abdominal: Soft.  Musculoskeletal:       Back:  + TTP over affected area + pain with resisted neck flexion and lateral movement spurlings negative.    Neurological: She is alert.  Skin: Skin is warm.    [facility] reading (PRIMARY) by  Dr. Antonieta Pert preliminairly negative for any fracture or disclocation Noted degenerative changes.                                         Assessment & Plan:  Fall, initial encounter - Plan: DG Cervical Spine Complete, DG Thoracic Spine 2 View  Cervical strain, initial encounter - Plan: predniSONE (DELTASONE) 50 MG tablet, cyclobenzaprine (FLEXERIL) 10 MG tablet  Will treat with prednisone and flexeril.  Discussed generla care and MSK red flags.  Follow up as needed.  Consider PT if sxs persist despite treatment.

## 2013-09-28 ENCOUNTER — Ambulatory Visit (INDEPENDENT_AMBULATORY_CARE_PROVIDER_SITE_OTHER): Payer: 59 | Admitting: *Deleted

## 2013-09-28 ENCOUNTER — Encounter (INDEPENDENT_AMBULATORY_CARE_PROVIDER_SITE_OTHER): Payer: Self-pay

## 2013-09-28 DIAGNOSIS — Z23 Encounter for immunization: Secondary | ICD-10-CM

## 2013-10-11 ENCOUNTER — Ambulatory Visit (INDEPENDENT_AMBULATORY_CARE_PROVIDER_SITE_OTHER): Payer: 59 | Admitting: Family Medicine

## 2013-10-11 ENCOUNTER — Encounter: Payer: Self-pay | Admitting: Family Medicine

## 2013-10-11 VITALS — BP 140/89 | HR 86 | Temp 99.4°F | Ht 60.0 in | Wt 192.0 lb

## 2013-10-11 DIAGNOSIS — F329 Major depressive disorder, single episode, unspecified: Secondary | ICD-10-CM

## 2013-10-11 DIAGNOSIS — E785 Hyperlipidemia, unspecified: Secondary | ICD-10-CM

## 2013-10-11 DIAGNOSIS — J329 Chronic sinusitis, unspecified: Secondary | ICD-10-CM

## 2013-10-11 DIAGNOSIS — J309 Allergic rhinitis, unspecified: Secondary | ICD-10-CM

## 2013-10-11 DIAGNOSIS — I10 Essential (primary) hypertension: Secondary | ICD-10-CM

## 2013-10-11 DIAGNOSIS — F3289 Other specified depressive episodes: Secondary | ICD-10-CM

## 2013-10-11 DIAGNOSIS — K219 Gastro-esophageal reflux disease without esophagitis: Secondary | ICD-10-CM

## 2013-10-11 DIAGNOSIS — R7989 Other specified abnormal findings of blood chemistry: Secondary | ICD-10-CM

## 2013-10-11 DIAGNOSIS — Z780 Asymptomatic menopausal state: Secondary | ICD-10-CM

## 2013-10-11 DIAGNOSIS — E559 Vitamin D deficiency, unspecified: Secondary | ICD-10-CM

## 2013-10-11 LAB — POCT CBC
Granulocyte percent: 54.2 %G (ref 37–80)
HCT, POC: 43.8 % (ref 37.7–47.9)
Lymph, poc: 2.5 (ref 0.6–3.4)
POC Granulocyte: 3.3 (ref 2–6.9)
Platelet Count, POC: 215 10*3/uL (ref 142–424)
RBC: 4.8 M/uL (ref 4.04–5.48)
RDW, POC: 12.7 %
WBC: 6 10*3/uL (ref 4.6–10.2)

## 2013-10-11 MED ORDER — AMOXICILLIN 500 MG PO CAPS: 500.0000 mg | ORAL_CAPSULE | Freq: Three times a day (TID) | ORAL | Status: DC

## 2013-10-11 NOTE — Progress Notes (Signed)
Subjective:    Patient ID: Melissa Bowman, female    DOB: 1951/10/03, 62 y.o.   MRN: [id number]  HPI Pt here for follow up and management of chronic medical problems. She comes in today with a main complaints of sinus, congestion, and cough. She also complains of generalized arthralgias.       Patient Active Problem List   Diagnosis Date Noted  . Hypertension 05/14/2013  . Hyperlipidemia 05/14/2013  . Depression 05/14/2013  . Allergic rhinitis 05/14/2013  . Vitamin D deficiency 05/14/2013  . Hemorrhage of rectum and anus 09/15/2011  . Dysphagia, pharyngoesophageal phase 09/15/2011  . NAFLD (nonalcoholic fatty liver disease) 78/29/5621  . Chronic RUQ pain 09/15/2011  . Diverticulosis of colon (without mention of hemorrhage) 09/15/2011  . BRBPR (bright red blood per rectum) 08/27/2011  . Internal hemorrhoids without mention of complication 08/27/2011  . Esophageal reflux 08/27/2011  . Abdominal pain 08/27/2011  . Esophageal dysphagia 08/27/2011  . Obesity 08/27/2011  . Anxiety and depression 08/27/2011  . 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 albuterol (PROVENTIL, VENTOLIN) (5 MG/ML) 0.5% NEBU Take by nebulization continuous.        Marland Kitchen amitriptyline (ELAVIL) 10 MG tablet Take 10 mg by mouth at bedtime.        Marland Kitchen amLODipine (NORVASC) 10 MG tablet       . Calcium 1200-1000 MG-UNIT CHEW Chew by mouth.        . Cholecalciferol (VITAMIN D-3) 5000 UNITS TABS Take by mouth daily.        Marland Kitchen EVISTA 60 MG tablet       . fenofibrate 160 MG tablet Take 160 mg by mouth daily.        Marland Kitchen losartan-hydrochlorothiazide (HYZAAR) 100-25 MG per tablet       . montelukast (SINGULAIR) 10 MG tablet Take 1 tablet (10 mg total) by mouth daily.  90 tablet  PRN  . omeprazole (PRILOSEC) 20 MG capsule Take 2 capsules (40 mg total) by mouth daily.  90 capsule  PRN  . venlafaxine (EFFEXOR) 75 MG tablet  Take 75 mg by mouth 3 (three) times daily.        . [DISCONTINUED] cyclobenzaprine (FLEXERIL) 10 MG tablet Take 1 tablet (10 mg total) by mouth 3 (three) times daily as needed for muscle spasms.  30 tablet  0  . [DISCONTINUED] predniSONE (DELTASONE) 50 MG tablet 1 tab daily x 5 days  5 tablet  0   No facility-administered encounter medications on file as of 10/11/2013.    Review of Systems  Constitutional: Positive for fever.  HENT: Positive for congestion, postnasal drip and sinus pressure.   Eyes: Negative.   Respiratory: Positive for cough.   Cardiovascular: Negative.   Gastrointestinal: Negative.   Endocrine: Negative.   Genitourinary: Negative.   Musculoskeletal: Positive for arthralgias.  Skin: Negative.   Allergic/Immunologic: Negative.   Neurological: Positive for headaches (sinus). Negative for dizziness.  Hematological: Negative.   Psychiatric/Behavioral: Negative.        Objective:   Physical Exam  Nursing note and vitals reviewed. Constitutional: She is oriented to person, place, and time. She appears well-developed and well-nourished.  HENT:  Head: Normocephalic and atraumatic.  Right Ear: External ear normal.  Left Ear: External ear normal.  Mouth/Throat: Oropharynx is clear and moist. No oropharyngeal exudate.  Nasal congestion bilaterally  Eyes: Conjunctivae and EOM are normal. Pupils are equal, round, and  reactive to light. Right eye exhibits no discharge. Left eye exhibits no discharge. No scleral icterus.  Neck: Normal range of motion. Neck supple. No JVD present. No thyromegaly present.  No bruits in neck  Cardiovascular: Normal rate, regular rhythm and intact distal pulses.  Exam reveals no gallop and no friction rub.   Murmur (grade 2/6 systolic ejection murmur) heard. At 72 per minute  Pulmonary/Chest: Effort normal and breath sounds normal. No respiratory distress. She has no wheezes. She has no rales. She exhibits no tenderness.  Dry cough   Abdominal: Soft. Bowel sounds are normal. She exhibits no mass. There is no tenderness. There is no rebound and no guarding.  It is of note that patient has done incredibly well with weight loss over the past several months and she has lost almost 23 pounds.  Musculoskeletal: Normal range of motion. She exhibits no edema and no tenderness.  Lymphadenopathy:    She has no cervical adenopathy.  Neurological: She is alert and oriented to person, place, and time. She has normal reflexes. No cranial nerve deficit.  Skin: Skin is warm and dry. No rash noted.  Psychiatric: She has a normal mood and affect. Her behavior is normal. Judgment and thought content normal.  Very pleasant and positive in her affect and demeanor   BP 140/89  Pulse 86  Temp(Src) 99.4 F (37.4 C) (Oral)  Ht 5' (1.524 m)  Wt 192 lb (87.091 kg)  BMI 37.5 kg/m2        Assessment & Plan:   1. Hyperlipidemia   2. Hypertension   3. Vitamin D deficiency   4. Postmenopausal   5. Esophageal reflux   6. Depression   7. Allergic rhinitis   8. Rhinosinusitis    Orders Placed This Encounter  Procedures  . DG Bone Density    PT NEEDS IN January DUE TO INSURANCE    Standing Status: Future     Number of Occurrences:      Standing Expiration Date: 12/11/2014    Order Specific Question:  Reason for Exam (SYMPTOM  OR DIAGNOSIS REQUIRED)    Answer:  POSTMENAPAUSAL    Order Specific Question:  Preferred imaging location?    Answer:  Internal  . Hepatic function panel  . BMP8+EGFR  . NMR, lipoprofile  . Vit D  25 hydroxy (rtn osteoporosis monitoring)  . POCT CBC   Meds ordered this encounter  Medications  . amoxicillin (AMOXIL) 500 MG capsule    Sig: Take 1 capsule (500 mg total) by mouth 3 (three) times daily.    Dispense:  30 capsule    Refill:  0   Patient Instructions  Take medications as directed Continue aggressive therapeutic lifestyle changes which include diet and exercise Return to clinic and get  Prevnar shot after checking with insurance  We will call you once lab results are available Use saline irrigation for sinuses through 4 times daily 1/2-1 teaspoon of salt to 8 ounces of warm water Always be careful and did not put herself at risk for falling   Nyra Capes MD

## 2013-10-11 NOTE — Patient Instructions (Signed)
Take medications as directed Continue aggressive therapeutic lifestyle changes which include diet and exercise Return to clinic and get Prevnar shot after checking with insurance  We will call you once lab results are available Use saline irrigation for sinuses through 4 times daily 1/2-1 teaspoon of salt to 8 ounces of warm water Always be careful and did not put herself at risk for falling

## 2013-10-13 LAB — NMR, LIPOPROFILE
Cholesterol: 219 mg/dL — ABNORMAL HIGH (ref <200)
HDL Cholesterol by NMR: 32 mg/dL — ABNORMAL LOW (ref >=40)
HDL Particle Number: 32.2 umol/L (ref >=30.5)
Triglycerides by NMR: 527 mg/dL — ABNORMAL HIGH (ref <150)

## 2013-10-13 LAB — BMP8+EGFR
BUN/Creatinine Ratio: 30 — ABNORMAL HIGH (ref 11–26)
BUN: 14 mg/dL (ref 8–27)
Creatinine, Ser: 0.47 mg/dL — ABNORMAL LOW (ref 0.57–1.00)
GFR calc Af Amer: 122 mL/min/{1.73_m2} (ref >59)

## 2013-10-13 LAB — HEPATIC FUNCTION PANEL
Albumin: 4.5 g/dL (ref 3.6–4.8)
Total Protein: 6.7 g/dL (ref 6.0–8.5)

## 2013-10-15 NOTE — Addendum Note (Signed)
Addended by: Orma Render F on: 10/15/2013 09:50 AM   Modules accepted: Orders

## 2013-10-19 ENCOUNTER — Other Ambulatory Visit: Payer: Self-pay | Admitting: *Deleted

## 2013-10-19 MED ORDER — FLUCONAZOLE 150 MG PO TABS: 150.0000 mg | ORAL_TABLET | Freq: Once | ORAL | Status: DC

## 2013-10-19 NOTE — Progress Notes (Signed)
One touch ultra test strips box of 100 ordered at [facility] pharmacy Patient to test 3 times a day for now.

## 2013-10-19 NOTE — Progress Notes (Signed)
Recently diagnosed diabetic that complains of intense vaginal itching for approx one week. OTC treatments have helped some but symptoms persist. Discussed with Bennie Pierini, NP and Diflucan sent to pharmacy. Patient will f/u if symptoms continue.

## 2013-10-31 ENCOUNTER — Encounter: Payer: Self-pay | Admitting: Pharmacist

## 2013-10-31 ENCOUNTER — Ambulatory Visit (INDEPENDENT_AMBULATORY_CARE_PROVIDER_SITE_OTHER): Payer: 59 | Admitting: Pharmacist

## 2013-10-31 VITALS — BP 130/72 | HR 76 | Ht 60.0 in | Wt 196.0 lb

## 2013-10-31 DIAGNOSIS — I10 Essential (primary) hypertension: Secondary | ICD-10-CM

## 2013-10-31 DIAGNOSIS — E119 Type 2 diabetes mellitus without complications: Secondary | ICD-10-CM

## 2013-10-31 NOTE — Progress Notes (Signed)
Diabetes Flow Sheet:  Visit 1  Chief Complaint:   Chief Complaint  Patient presents with  . Diabetes    newly diagnosed about 2 weeks ago    HPI: patient newly diagnosed with type 2 DM around 10/15/13.  Prior to her diagnosis pt has lost about 23#.  Started Janumet 50/1000mg  BID shortly after diagnosis.   She is checking BG at home.  BG readings were 150 to 230's at beginning but most recent readings have been 90 to 136.  Patient states that when BG is 90 to 105 she feels lightheaded and weak.   Positive family history of DM - 2 brothers and 1 sister.  No history of gestational DM   BMI:  Body mass index is 38.28 kg/(m^2).  General Appearance:  alert, oriented, no acute distress Mood/Affect:  normal  Low fat/carbohydrate diet?  No Nicotine Abuse?  No Medication Compliance?  Yes Exercise?  Yes - sometimes Alcohol Abuse?  No  Glucose Readings Causal plasma glucose reading > 200mg /dL:     Lab Results  Component Value Date   HGBA1C 11.5 % 10/15/2013    Lab Results  Component Value Date   CHOL 219* 10/11/2013   LDLCALC 109* 05/09/2013   TRIG 325* 05/09/2013     Medication Checklist: ACE Inhibitor/ARB?  Yes Lipid Lowering Agent?  Yes - fenofibrate but intolerant to statins Aspirin?  Yes Oral Hypoglycemic Agent(s)?  Yes  Assessment: 1.  type 2 Diabetes.  Newly diagnosed, improving control per HBG 2.  Blood Pressure Control.  - controlled 3.  Lipid Control.  Elevated Tg and LDL-P   Recommendations: 1.  1500 calorie, carbohydrate counting diet.  Patient is counseled extensively on carbohydrate counting, serving sizes, saturated fat intake and meal planning.  Patient is instructed to eat 3 meals a day and 3 small snacks.  Patient will supplement snacks based on physical activity. 2.  30 minutes of physical activity.  Patient is counseled to always carry glucose tablets, lifesavers, hard candies, etc., while exercising in case of hypoglycemic event. 3.  Patient is counseled on  pathophysiology of diabetes and the risk of long-term complications.  Fasting blood glucose goals are 80-130mg /dL.  Post-prandial goals are < 180.  A1C goals < 6.5%. 4.  LDL goal of < 100, HDL > 40 and TG < 150; BP goal < 130/80 5.  Patient is informed to continue to check BG 2 to 3 times daily and how to respond to unsuitable results. 6.  Medication recommendations at this time are as follows:  Change to Janumet XR 50/1000mg  1 tablet daily with food.  I also discussed several alternative medications for type 2 DM which might offer weight loss as well - Tandem / Bydureon and Invokana.  Patient given information to take home and review.  Will discuss at new appt.  Time spent counseling patient:  60 minutes   Referring provider:  Lady Deutscher, PharmD, CPP

## 2013-11-05 ENCOUNTER — Telehealth: Payer: Self-pay | Admitting: *Deleted

## 2013-11-05 MED ORDER — CIPROFLOXACIN HCL 500 MG PO TABS: 500.0000 mg | ORAL_TABLET | Freq: Two times a day (BID) | ORAL | Status: DC

## 2013-11-05 NOTE — Telephone Encounter (Signed)
Pt called and spoke to [facility] the weekend and med was called to pharm.

## 2013-11-20 ENCOUNTER — Telehealth: Payer: Self-pay | Admitting: Family Medicine

## 2013-11-20 NOTE — Telephone Encounter (Signed)
forwared calle to [employer]

## 2013-11-26 ENCOUNTER — Ambulatory Visit (INDEPENDENT_AMBULATORY_CARE_PROVIDER_SITE_OTHER): Payer: 59 | Admitting: Pharmacist

## 2013-11-26 DIAGNOSIS — E119 Type 2 diabetes mellitus without complications: Secondary | ICD-10-CM

## 2013-11-26 NOTE — Progress Notes (Signed)
erro  neous encounter

## 2013-11-29 ENCOUNTER — Other Ambulatory Visit: Payer: Self-pay | Admitting: *Deleted

## 2013-11-29 ENCOUNTER — Ambulatory Visit: Payer: 59

## 2013-11-29 MED ORDER — FENOFIBRATE 160 MG PO TABS: 160.0000 mg | ORAL_TABLET | Freq: Every day | ORAL | Status: DC

## 2013-12-03 ENCOUNTER — Other Ambulatory Visit: Payer: Self-pay | Admitting: *Deleted

## 2013-12-03 MED ORDER — SITAGLIP PHOS-METFORMIN HCL ER 50-1000 MG PO TB24: 1.0000 | ORAL_TABLET | Freq: Every day | ORAL | Status: DC

## 2013-12-03 NOTE — Telephone Encounter (Signed)
Needs mail order prescription for janumet XR 50-1000 Script sent in

## 2013-12-10 ENCOUNTER — Other Ambulatory Visit: Payer: Self-pay | Admitting: *Deleted

## 2013-12-10 MED ORDER — SITAGLIP PHOS-METFORMIN HCL ER 50-1000 MG PO TB24: 2.0000 | ORAL_TABLET | Freq: Every day | ORAL | Status: DC

## 2013-12-26 ENCOUNTER — Ambulatory Visit (INDEPENDENT_AMBULATORY_CARE_PROVIDER_SITE_OTHER): Payer: 59 | Admitting: Pharmacist

## 2013-12-26 ENCOUNTER — Encounter: Payer: Self-pay | Admitting: Pharmacist

## 2013-12-26 ENCOUNTER — Ambulatory Visit (INDEPENDENT_AMBULATORY_CARE_PROVIDER_SITE_OTHER): Payer: 59

## 2013-12-26 VITALS — BP 126/70 | HR 80 | Ht 60.0 in | Wt 194.0 lb

## 2013-12-26 DIAGNOSIS — M949 Disorder of cartilage, unspecified: Secondary | ICD-10-CM

## 2013-12-26 DIAGNOSIS — E119 Type 2 diabetes mellitus without complications: Secondary | ICD-10-CM

## 2013-12-26 DIAGNOSIS — E559 Vitamin D deficiency, unspecified: Secondary | ICD-10-CM

## 2013-12-26 DIAGNOSIS — M899 Disorder of bone, unspecified: Secondary | ICD-10-CM

## 2013-12-26 DIAGNOSIS — Z780 Asymptomatic menopausal state: Secondary | ICD-10-CM

## 2013-12-26 DIAGNOSIS — M8580 Other specified disorders of bone density and structure, unspecified site: Secondary | ICD-10-CM | POA: Insufficient documentation

## 2013-12-26 MED ORDER — ASPIRIN EC 81 MG PO TBEC: 81.0000 mg | DELAYED_RELEASE_TABLET | Freq: Every day | ORAL | Status: DC

## 2013-12-26 NOTE — Progress Notes (Signed)
Patient ID: Melissa Bowman, female   DOB: 01-27-1951, 63 y.o.   MRN: [id number]  [facility] Current Height: Height: 5' (152.4 cm)      Max Lifetime Height:  5' 1.5" Current Weight: Weight: 194 lb (87.998 kg)       Ethnicity:Caucasian  BP: BP: 126/70 mmHg     HR:  Pulse Rate: 80      HPI: Patient with type 2 DM and osteopenia.  Here today to check DEXA and review HBG readings.  Back Pain?  YES     Kyphosis?  No Prior fracture?  Yes - WRIST Med(s) for Osteoporosis/Osteopenia:  EVSITA Med(s) previously tried for Osteoporosis/Osteopenia:  FOSAMAX FOR ABOUT 20 YEARS   Home BG readings - 118, 99, 91, 98, 106, 116, 96, 111, 115, 108, 96, 87, 90                                                             PMH: Age at menopause:  63 yo Hysterectomy?  Yes Oophorectomy?  Yes HRT? Yes - former estrogen but never was able to tolerate for more than a few months Steroid Use?  No Thyroid med?  No History of cancer?  No History of digestive disorders (ie Crohn's)?  No - taking PPI regularly Current or previous eating disorders?  No Last Vitamin D Result:  46 (10/11/2013) Last GFR Result:  105 (10/30/20140   FH/SH: Family history of osteoporosis?  Yes - 2 SISTERS AND MOTHER Parent with history of hip fracture?  Yes - FATHER AND MOTHER Family history of breast cancer?  No Exercise?  Yes - walking 15 to 20 minutes daily Smoking?  No Alcohol?  No    Calcium Assessment Calcium Intake  # of servings/day  Calcium mg  Milk (8 oz) 0  x  300  = 0  Yogurt (4 oz) 0 x  200 = 0  Cheese (1 oz) 0 x  200 = 0  Other Calcium sources   250mg   Ca supplement 1200mg  = 1200mg    Estimated calcium intake per day 1470mh    DEXA Results Date of Test T-Score for AP Spine L1-L4 T-Score for Total Left Hip T-Score for Total Right Hip Neck of Left Hip Neck of Right HIp  10/26/2014 -0.8 0.0 -0.4 -1.6 -1.3  11/16/2009 -1.3 -0.1 0.0 -1.2 -1.2  10/29/2009 -1.0 0.0 0.0 -1.4 -1.1          FRAX 10 year  estimate: Total FX risk:  26% (consider medication if >/= 20%) Hip FX risk:  1.4%  (consider medication if >/= 3%)  Assessment: Osteopenia - stable BMD  Type 2 DM great home BG readings  History of vitamin D deficiency - last check was normal  Recommendations: 1. Continue reloxifine (EVISTA) 60mg  1 tablet daily 2.  Continue Janumet 50/1000mg  2 tablets daily      Start ASA EC 81 mg daily 3.  continue calcium 1200mg  daily through supplementation or diet.  Continue current vitamin D supplementation. 4.  continue weight bearing exercise - 30 minutes at least 4 days  per week.   5.  Counseled and educated about fall risk and prevention.  Recheck DEXA:  2 years  Time spent counseling patient:  30 minutes  Cherre Robins, PharmD, CPP

## 2013-12-26 NOTE — Patient Instructions (Signed)
Hypoglycemia (Low Blood Sugar) Hypoglycemia is when the glucose (sugar) in your blood is too low. Hypoglycemia can happen for many reasons. It can happen to people with or without diabetes. Hypoglycemia can develop quickly and can be a medical emergency.  CAUSES  Having hypoglycemia does not mean that you will develop diabetes. Different causes include:  Missed or delayed meals or not enough carbohydrates eaten.  Medication overdose. This could be by accident or deliberate. If by accident, your medication may need to be adjusted or changed.  Exercise or increased activity without adjustments in carbohydrates or medications.  A nerve disorder that affects body functions like your heart rate, blood pressure and digestion (autonomic neuropathy).  A condition where the stomach muscles do not function properly (gastroparesis). Therefore, medications may not absorb properly.  The inability to recognize the signs of hypoglycemia (hypoglycemic unawareness).  Absorption of insulin  may be altered.  Alcohol consumption.  Pregnancy/menstrual cycles/postpartum. This may be due to hormones.  Certain kinds of tumors. This is very rare. SYMPTOMS   Sweating.  Hunger.  Dizziness.  Blurred vision.  Drowsiness.  Weakness.  Headache.  Rapid heart beat.  Shakiness.  Nervousness. DIAGNOSIS  Diagnosis is made by monitoring blood glucose in one or all of the following ways:  Fingerstick blood glucose monitoring.  Laboratory results. TREATMENT  If you think your blood glucose is low:  Check your blood glucose, if possible. If it is less than 70 mg/dl, take one of the following:  3-4 glucose tablets.   cup juice (prefer clear like apple).   cup "regular" soda pop.  1 cup milk.  -1 tube of glucose gel.  5-6 hard candies.  Do not over treat because your blood glucose (sugar) will only go too high.  Wait 15 minutes and recheck your blood glucose. If it is still less than  70 mg/dl (or below your target range), repeat treatment.  Eat a snack if it is more than one hour until your next meal. Sometimes, your blood glucose may go so low that you are unable to treat yourself. You may need someone to help you. You may even pass out or be unable to swallow. This may require you to get an injection of glucagon, which raises the blood glucose. HOME CARE INSTRUCTIONS  Check blood glucose as recommended by your caregiver.  Take medication as prescribed by your caregiver.  Follow your meal plan. Do not skip meals. Eat on time.  If you are going to drink alcohol, drink it only with meals.  Check your blood glucose before driving.  Check your blood glucose before and after exercise. If you exercise longer or different than usual, be sure to check blood glucose more frequently.  Always carry treatment with you. Glucose tablets are the easiest to carry.  Always wear medical alert jewelry or carry some form of identification that states that you have diabetes. This will alert people that you have diabetes. If you have hypoglycemia, they will have a better idea on what to do. SEEK MEDICAL CARE IF:   You are having problems keeping your blood sugar at target range.  You are having frequent episodes of hypoglycemia.  You feel you might be having side effects from your medicines.  You have symptoms of an illness that is not improving after 3-4 days.  You notice a change in vision or a new problem with your vision. SEEK IMMEDIATE MEDICAL CARE IF:   You are a family member or friend of a   person whose blood glucose goes below 70 mg/dl and is accompanied by:  Confusion.  A change in mental status.  The inability to swallow.  Passing out. Document Released: 11/29/2005 Document Revised: 02/21/2012 Document Reviewed: 03/27/2012 [facility] Patient Information 2014 [place], [place]. Fall Prevention and Home Safety Falls cause injuries and can affect all age groups. It is  possible to use preventive measures to significantly decrease the likelihood of falls. There are many simple measures which can make your home safer and prevent falls. OUTDOORS  Repair cracks and edges of walkways and driveways.  Remove high doorway thresholds.  Trim shrubbery on the main path into your home.  Have good outside lighting.  Clear walkways of tools, rocks, debris, and clutter.  Check that handrails are not broken and are securely fastened. Both sides of steps should have handrails.  Have leaves, snow, and ice cleared regularly.  Use sand or salt on walkways during winter months.  In the garage, clean up grease or oil spills. BATHROOM  Install night lights.  Install grab bars by the toilet and in the tub and shower.  Use non-skid mats or decals in the tub or shower.  Place a plastic non-slip stool in the shower to sit on, if needed.  Keep floors dry and clean up all water on the floor immediately.  Remove soap buildup in the tub or shower on a regular basis.  Secure bath mats with non-slip, double-sided rug tape.  Remove throw rugs and tripping hazards from the floors. BEDROOMS  Install night lights.  Make sure a bedside light is easy to reach.  Do not use oversized bedding.  Keep a telephone by your bedside.  Have a firm chair with side arms to use for getting dressed.  Remove throw rugs and tripping hazards from the floor. KITCHEN  Keep handles on pots and pans turned toward the center of the stove. Use back burners when possible.  Clean up spills quickly and allow time for drying.  Avoid walking on wet floors.  Avoid hot utensils and knives.  Position shelves so they are not too high or low.  Place commonly used objects within easy reach.  If necessary, use a sturdy step stool with a grab bar when reaching.  Keep electrical cables out of the way.  Do not use floor polish or wax that makes floors slippery. If you must use wax, use  non-skid floor wax.  Remove throw rugs and tripping hazards from the floor. STAIRWAYS  Never leave objects on stairs.  Place handrails on both sides of stairways and use them. Fix any loose handrails. Make sure handrails on both sides of the stairways are as long as the stairs.  Check carpeting to make sure it is firmly attached along stairs. Make repairs to worn or loose carpet promptly.  Avoid placing throw rugs at the top or bottom of stairways, or properly secure the rug with carpet tape to prevent slippage. Get rid of throw rugs, if possible.  Have an electrician put in a light switch at the top and bottom of the stairs. OTHER FALL PREVENTION TIPS  Wear low-heel or rubber-soled shoes that are supportive and fit well. Wear closed toe shoes.  When using a stepladder, make sure it is fully opened and both spreaders are firmly locked. Do not climb a closed stepladder.  Add color or contrast paint or tape to grab bars and handrails in your home. Place contrasting color strips on first and last steps.  Learn and  use mobility aids as needed. Install an electrical emergency response system.  Turn on lights to avoid dark areas. Replace light bulbs that burn out immediately. Get light switches that glow.  Arrange furniture to create clear pathways. Keep furniture in the same place.  Firmly attach carpet with non-skid or double-sided tape.  Eliminate uneven floor surfaces.  Select a carpet pattern that does not visually hide the edge of steps.  Be aware of all pets. OTHER HOME SAFETY TIPS  Set the water temperature for 120 F (48.8 C).  Keep emergency numbers on or near the telephone.  Keep smoke detectors on every level of the home and near sleeping areas. Document Released: 11/19/2002 Document Revised: 05/30/2012 Document Reviewed: 02/18/2012 [facility] Patient Information 2014 [place].

## 2013-12-31 ENCOUNTER — Other Ambulatory Visit: Payer: Self-pay | Admitting: *Deleted

## 2014-01-20 ENCOUNTER — Other Ambulatory Visit: Payer: Self-pay | Admitting: Family Medicine

## 2014-01-21 ENCOUNTER — Ambulatory Visit: Payer: 59 | Admitting: Family Medicine

## 2014-01-22 ENCOUNTER — Telehealth: Payer: Self-pay | Admitting: *Deleted

## 2014-01-22 ENCOUNTER — Other Ambulatory Visit (INDEPENDENT_AMBULATORY_CARE_PROVIDER_SITE_OTHER): Payer: 59

## 2014-01-22 DIAGNOSIS — R6889 Other general symptoms and signs: Secondary | ICD-10-CM

## 2014-01-22 LAB — POCT INFLUENZA A/B
Influenza A, POC: POSITIVE
Influenza B, POC: NEGATIVE

## 2014-01-22 NOTE — Telephone Encounter (Signed)
Pt had flu swab only- was posit for A flu and she took a round of tamiflu that a family gave her.

## 2014-01-29 ENCOUNTER — Ambulatory Visit: Payer: 59 | Admitting: Family Medicine

## 2014-01-30 ENCOUNTER — Other Ambulatory Visit: Payer: Self-pay | Admitting: *Deleted

## 2014-01-30 MED ORDER — RALOXIFENE HCL 60 MG PO TABS: 60.0000 mg | ORAL_TABLET | Freq: Every day | ORAL | Status: DC

## 2014-01-30 NOTE — Telephone Encounter (Signed)
Patient needs generic for Evista sent to mail order pharmacy.

## 2014-02-14 ENCOUNTER — Encounter: Payer: Self-pay | Admitting: Nurse Practitioner

## 2014-02-14 ENCOUNTER — Ambulatory Visit (INDEPENDENT_AMBULATORY_CARE_PROVIDER_SITE_OTHER): Payer: 59 | Admitting: Nurse Practitioner

## 2014-02-14 VITALS — BP 130/85 | HR 95 | Temp 98.3°F | Ht 60.0 in | Wt 192.0 lb

## 2014-02-14 DIAGNOSIS — N390 Urinary tract infection, site not specified: Secondary | ICD-10-CM

## 2014-02-14 DIAGNOSIS — J0100 Acute maxillary sinusitis, unspecified: Secondary | ICD-10-CM

## 2014-02-14 DIAGNOSIS — R300 Dysuria: Secondary | ICD-10-CM

## 2014-02-14 LAB — POCT URINALYSIS DIPSTICK
BILIRUBIN UA: NEGATIVE
Glucose, UA: NEGATIVE
Ketones, UA: NEGATIVE
Nitrite, UA: NEGATIVE
PH UA: 7.5
PROTEIN UA: NEGATIVE
Urobilinogen, UA: NEGATIVE

## 2014-02-14 LAB — POCT UA - MICROSCOPIC ONLY
BACTERIA, U MICROSCOPIC: NEGATIVE
CRYSTALS, UR, HPF, POC: NEGATIVE
Casts, Ur, LPF, POC: NEGATIVE
Mucus, UA: NEGATIVE
Yeast, UA: NEGATIVE

## 2014-02-14 MED ORDER — CIPROFLOXACIN HCL 500 MG PO TABS: 500.0000 mg | ORAL_TABLET | Freq: Two times a day (BID) | ORAL | Status: DC

## 2014-02-14 NOTE — Patient Instructions (Signed)
Sinusitis Sinusitis is redness, soreness, and swelling (inflammation) of the paranasal sinuses. Paranasal sinuses are air pockets within the bones of your face (beneath the eyes, the middle of the forehead, or above the eyes). In healthy paranasal sinuses, mucus is able to drain out, and air is able to circulate through them by way of your nose. However, when your paranasal sinuses are inflamed, mucus and air can become trapped. This can allow bacteria and other germs to grow and cause infection. Sinusitis can develop quickly and last only a short time (acute) or continue over a long period (chronic). Sinusitis that lasts for more than 12 weeks is considered chronic.  CAUSES  Causes of sinusitis include:  Allergies.  Structural abnormalities, such as displacement of the cartilage that separates your nostrils (deviated septum), which can decrease the air flow through your nose and sinuses and affect sinus drainage.  Functional abnormalities, such as when the small hairs (cilia) that line your sinuses and help remove mucus do not work properly or are not present. SYMPTOMS  Symptoms of acute and chronic sinusitis are the same. The primary symptoms are pain and pressure around the affected sinuses. Other symptoms include:  Upper toothache.  Earache.  Headache.  Bad breath.  Decreased sense of smell and taste.  A cough, which worsens when you are lying flat.  Fatigue.  Fever.  Thick drainage from your nose, which often is green and may contain pus (purulent).  Swelling and warmth over the affected sinuses. DIAGNOSIS  Your caregiver will perform a physical exam. During the exam, your caregiver may:  Look in your nose for signs of abnormal growths in your nostrils (nasal polyps).  Tap over the affected sinus to check for signs of infection.  View the inside of your sinuses (endoscopy) with a special imaging device with a light attached (endoscope), which is inserted into your  sinuses. If your caregiver suspects that you have chronic sinusitis, one or more of the following tests may be recommended:  Allergy tests.  Nasal culture A sample of mucus is taken from your nose and sent to a lab and screened for bacteria.  Nasal cytology A sample of mucus is taken from your nose and examined by your caregiver to determine if your sinusitis is related to an allergy. TREATMENT  Most cases of acute sinusitis are related to a viral infection and will resolve on their own within 10 days. Sometimes medicines are prescribed to help relieve symptoms (pain medicine, decongestants, nasal steroid sprays, or saline sprays).  However, for sinusitis related to a bacterial infection, your caregiver will prescribe antibiotic medicines. These are medicines that will help kill the bacteria causing the infection.  Rarely, sinusitis is caused by a fungal infection. In theses cases, your caregiver will prescribe antifungal medicine. For some cases of chronic sinusitis, surgery is needed. Generally, these are cases in which sinusitis recurs more than 3 times per year, despite other treatments. HOME CARE INSTRUCTIONS   Drink plenty of water. Water helps thin the mucus so your sinuses can drain more easily.  Use a humidifier.  Inhale steam 3 to 4 times a day (for example, sit in the bathroom with the shower running).  Apply a warm, moist washcloth to your face 3 to 4 times a day, or as directed by your caregiver.  Use saline nasal sprays to help moisten and clean your sinuses.  Take over-the-counter or prescription medicines for pain, discomfort, or fever only as directed by your caregiver. SEEK IMMEDIATE MEDICAL   CARE IF:  You have increasing pain or severe headaches.  You have nausea, vomiting, or drowsiness.  You have swelling around your face.  You have vision problems.  You have a stiff neck.  You have difficulty breathing. MAKE SURE YOU:   Understand these  instructions.  Will watch your condition.  Will get help right away if you are not doing well or get worse. Document Released: 11/29/2005 Document Revised: 02/21/2012 Document Reviewed: 12/14/2011 [facility] Patient Information 2014 [place], [place]. Urinary Tract Infection Urinary tract infections (UTIs) can develop anywhere along your urinary tract. Your urinary tract is your body's drainage system for removing wastes and extra water. Your urinary tract includes two kidneys, two ureters, a bladder, and a urethra. Your kidneys are a pair of bean-shaped organs. Each kidney is about the size of your fist. They are located below your ribs, one on each side of your spine. CAUSES Infections are caused by microbes, which are microscopic organisms, including fungi, viruses, and bacteria. These organisms are so small that they can only be seen through a microscope. Bacteria are the microbes that most commonly cause UTIs. SYMPTOMS  Symptoms of UTIs may vary by age and gender of the patient and by the location of the infection. Symptoms in young women typically include a frequent and intense urge to urinate and a painful, burning feeling in the bladder or urethra during urination. Older women and men are more likely to be tired, shaky, and weak and have muscle aches and abdominal pain. A fever may mean the infection is in your kidneys. Other symptoms of a kidney infection include pain in your back or sides below the ribs, nausea, and vomiting. DIAGNOSIS To diagnose a UTI, your caregiver will ask you about your symptoms. Your caregiver also will ask to provide a urine sample. The urine sample will be tested for bacteria and white blood cells. White blood cells are made by your body to help fight infection. TREATMENT  Typically, UTIs can be treated with medication. Because most UTIs are caused by a bacterial infection, they usually can be treated with the use of antibiotics. The choice of antibiotic and length of  treatment depend on your symptoms and the type of bacteria causing your infection. HOME CARE INSTRUCTIONS  If you were prescribed antibiotics, take them exactly as your caregiver instructs you. Finish the medication even if you feel better after you have only taken some of the medication.  Drink enough water and fluids to keep your urine clear or pale yellow.  Avoid caffeine, tea, and carbonated beverages. They tend to irritate your bladder.  Empty your bladder often. Avoid holding urine for long periods of time.  Empty your bladder before and after sexual intercourse.  After a bowel movement, women should cleanse from front to back. Use each tissue only once. SEEK MEDICAL CARE IF:   You have back pain.  You develop a fever.  Your symptoms do not begin to resolve within 3 days. SEEK IMMEDIATE MEDICAL CARE IF:   You have severe back pain or lower abdominal pain.  You develop chills.  You have nausea or vomiting.  You have continued burning or discomfort with urination. MAKE SURE YOU:   Understand these instructions.  Will watch your condition.  Will get help right away if you are not doing well or get worse. Document Released: 09/08/2005 Document Revised: 05/30/2012 Document Reviewed: 01/07/2012 [facility] Patient Information 2014 [place].

## 2014-02-14 NOTE — Addendum Note (Signed)
Addended by: Ilean China on: 02/14/2014 05:02 PM   Modules accepted: Orders

## 2014-02-14 NOTE — Progress Notes (Signed)
Subjective:    Patient ID: Melissa Bowman, female    DOB: 15-Nov-1951, 63 y.o.   MRN: [id number]  HPI Patient presents today complaining of a bladder infection. Symptoms began a couple days ago and include frequency, urgency, and burning. Denies fever and CVA tenderness. No OTC treatment tried. Has increased water intake. Patient is also complaining of congestion and sinus fullness. Denies rhinorrhea, ear pain, or sore throat. Has been using nasal saline with some relief.    Review of Systems  Constitutional: Negative for fever.  HENT: Positive for congestion and sinus pressure. Negative for ear pain, rhinorrhea and sore throat.   Respiratory: Negative for shortness of breath.   Cardiovascular: Negative for chest pain.  Genitourinary: Positive for dysuria, urgency and frequency. Negative for hematuria and flank pain.  All other systems reviewed and are negative.       Objective:   Physical Exam  Constitutional: She is oriented to person, place, and time. She appears well-developed and well-nourished.  HENT:  Right Ear: External ear normal.  Left Ear: External ear normal.  Nose: Right sinus exhibits maxillary sinus tenderness. Left sinus exhibits maxillary sinus tenderness.  Mouth/Throat: Oropharynx is clear and moist.  Eyes: Pupils are equal, round, and reactive to light.  Neck: Normal range of motion. Neck supple.  Cardiovascular: Normal rate, regular rhythm and normal heart sounds.   Pulmonary/Chest: Effort normal and breath sounds normal.  Abdominal: Soft. Bowel sounds are normal. There is no tenderness. There is no CVA tenderness.  Lymphadenopathy:    She has no cervical adenopathy.  Neurological: She is alert and oriented to person, place, and time.  Skin: Skin is warm and dry.  Psychiatric: She has a normal mood and affect. Her behavior is normal. Judgment and thought content normal.     BP 130/85  Pulse 95  Temp(Src) 98.3 F (36.8 C) (Oral)  Ht 5' (1.524 m)  Wt  192 lb (87.091 kg)  BMI 37.50 kg/m2  Results for orders placed in visit on 02/14/14  POCT UA - MICROSCOPIC ONLY      Result Value Ref Range   WBC, Ur, HPF, POC 30-40     RBC, urine, microscopic 5-10     Bacteria, U Microscopic NEG     Mucus, UA NEG     Epithelial cells, urine per micros OCC     Crystals, Ur, HPF, POC NEG     Casts, Ur, LPF, POC NEG     Yeast, UA NEG    POCT URINALYSIS DIPSTICK      Result Value Ref Range   Color, UA YELLOW     Clarity, UA CLOUDY     Glucose, UA NEG     Bilirubin, UA NEG     Ketones, UA NEG     Spec Grav, UA <=1.005     Blood, UA TRACE     pH, UA 7.5     Protein, UA NEG     Urobilinogen, UA negative     Nitrite, UA NEG     Leukocytes, UA large (3+)     BP 130/85  Pulse 95  Temp(Src) 98.3 F (36.8 C) (Oral)  Ht 5' (1.524 m)  Wt 192 lb (87.091 kg)  BMI 37.50 kg/m2      Assessment & Plan:   1. UTI (urinary tract infection)   2. Dysuria   3. Sinusitis, acute maxillary    Orders Placed This Encounter  Procedures  . POCT UA - Microscopic Only  .  POCT urinalysis dipstick   Meds ordered this encounter  Medications  . ciprofloxacin (CIPRO) 500 MG tablet    Sig: Take 1 tablet (500 mg total) by mouth 2 (two) times daily.    Dispense:  20 tablet    Refill:  0    Order Specific Question:  Supervising Provider    Answer:  Deborra Medina   Force fluids AZO over the counter X2 day Culture pending  1. Take meds as prescribed 2. Use a cool mist humidifier especially during the winter months and when heat has been humid. 3. Use saline nose sprays frequently 4. Saline irrigations of the nose can be very helpful if done frequently.  * 4X daily for 1 week*  * Use of a nettie pot can be helpful with this. Follow directions with this* 5. Drink plenty of fluids 6. Keep thermostat turn down low 7.For any cough or congestion  Use plain Mucinex- regular strength or max strength is fine   * Children- consult with Pharmacist for  dosing 8. For fever or aces or pains- take tylenol or ibuprofen appropriate for age and weight.  * for fevers greater than 101 orally you may alternate ibuprofen and tylenol every  3 hours.   RTC if symptoms worsen or do not improve  Mary-Margaret Daphine Deutscher, FNP

## 2014-02-19 ENCOUNTER — Telehealth: Payer: Self-pay | Admitting: *Deleted

## 2014-02-20 NOTE — Telephone Encounter (Signed)
Evista and the generic is too expensive. Is there a cheaper alternative?

## 2014-02-20 NOTE — Telephone Encounter (Signed)
There is not really an alternative for Evista or its generic. Patient's insruance currently has a high deductible and evista is over $400.   I discussed with patient alternatives.  Has already taken foasamax for over 20 years.   Patient will hold off on evista until meets deductible and then will restart once cost is less.

## 2014-02-25 ENCOUNTER — Ambulatory Visit (INDEPENDENT_AMBULATORY_CARE_PROVIDER_SITE_OTHER): Payer: 59 | Admitting: Family Medicine

## 2014-02-25 ENCOUNTER — Ambulatory Visit (INDEPENDENT_AMBULATORY_CARE_PROVIDER_SITE_OTHER): Payer: 59

## 2014-02-25 ENCOUNTER — Encounter (INDEPENDENT_AMBULATORY_CARE_PROVIDER_SITE_OTHER): Payer: Self-pay

## 2014-02-25 ENCOUNTER — Encounter: Payer: Self-pay | Admitting: Family Medicine

## 2014-02-25 VITALS — BP 143/89 | HR 75 | Temp 99.9°F | Ht 60.0 in | Wt 188.0 lb

## 2014-02-25 DIAGNOSIS — N390 Urinary tract infection, site not specified: Secondary | ICD-10-CM

## 2014-02-25 DIAGNOSIS — M949 Disorder of cartilage, unspecified: Secondary | ICD-10-CM

## 2014-02-25 DIAGNOSIS — R059 Cough, unspecified: Secondary | ICD-10-CM

## 2014-02-25 DIAGNOSIS — R053 Chronic cough: Secondary | ICD-10-CM

## 2014-02-25 DIAGNOSIS — E785 Hyperlipidemia, unspecified: Secondary | ICD-10-CM

## 2014-02-25 DIAGNOSIS — R05 Cough: Secondary | ICD-10-CM

## 2014-02-25 DIAGNOSIS — M899 Disorder of bone, unspecified: Secondary | ICD-10-CM

## 2014-02-25 DIAGNOSIS — J069 Acute upper respiratory infection, unspecified: Secondary | ICD-10-CM

## 2014-02-25 DIAGNOSIS — I10 Essential (primary) hypertension: Secondary | ICD-10-CM

## 2014-02-25 DIAGNOSIS — E119 Type 2 diabetes mellitus without complications: Secondary | ICD-10-CM

## 2014-02-25 DIAGNOSIS — M8580 Other specified disorders of bone density and structure, unspecified site: Secondary | ICD-10-CM

## 2014-02-25 DIAGNOSIS — E559 Vitamin D deficiency, unspecified: Secondary | ICD-10-CM

## 2014-02-25 LAB — POCT URINALYSIS DIPSTICK
Bilirubin, UA: NEGATIVE
GLUCOSE UA: NEGATIVE
Ketones, UA: NEGATIVE
NITRITE UA: POSITIVE
PROTEIN UA: NEGATIVE
Spec Grav, UA: <=1.005
UROBILINOGEN UA: NEGATIVE
pH, UA: 8

## 2014-02-25 LAB — POCT CBC
GRANULOCYTE PERCENT: 61.2 % (ref 37–80)
HEMATOCRIT: 40.1 % (ref 37.7–47.9)
HEMOGLOBIN: 12.9 g/dL (ref 12.2–16.2)
Lymph, poc: 3.5 — AB (ref 0.6–3.4)
MCH, POC: 31 pg (ref 27–31.2)
MCHC: 32.2 g/dL (ref 31.8–35.4)
MCV: 96 fL (ref 80–97)
MPV: 7.8 fL (ref 0–99.8)
POC Granulocyte: 5.8 (ref 2–6.9)
POC LYMPH %: 36.8 % (ref 10–50)
Platelet Count, POC: 300 10*3/uL (ref 142–424)
RBC: 4.2 M/uL (ref 4.04–5.48)
RDW, POC: 12.9 %
WBC: 9.4 10*3/uL (ref 4.6–10.2)

## 2014-02-25 LAB — POCT UA - MICROSCOPIC ONLY
CASTS, UR, LPF, POC: NEGATIVE
CRYSTALS, UR, HPF, POC: NEGATIVE
Mucus, UA: NEGATIVE
Yeast, UA: NEGATIVE

## 2014-02-25 LAB — POCT GLYCOSYLATED HEMOGLOBIN (HGB A1C): Hemoglobin A1C: 4.9

## 2014-02-25 LAB — POCT UA - MICROALBUMIN: MICROALBUMIN (UR) POC: 100 mg/L

## 2014-02-25 NOTE — Addendum Note (Signed)
Addended by: Pollyann Kennedy F on: 02/25/2014 11:57 AM   Modules accepted: Orders

## 2014-02-25 NOTE — Addendum Note (Signed)
Addended by: Pollyann Kennedy F on: 02/25/2014 11:58 AM   Modules accepted: Orders

## 2014-02-25 NOTE — Patient Instructions (Addendum)
Continue current medications. Continue good therapeutic lifestyle changes which include good diet and exercise. Fall precautions discussed with patient. If an FOBT was given today- please return it to our front desk. If you are over 64 years old - you may need Prevnar 100 or the adult Pneumonia vaccine.  Check with your insurance regarding the Prevnar vaccine Return  the FOBT Take Mucinex if needed for cough and congestion Use saline nose spray frequently for sinus congestion

## 2014-02-25 NOTE — Progress Notes (Signed)
Subjective:    Patient ID: Melissa Bowman, female    DOB: 1951/07/06, 63 y.o.   MRN: [id number]  HPI Pt here for follow up and management of chronic medical problems. The patient has had a recent urinary tract infection and she does not think that it is completely cleared at this point. She's been taking Cipro for this. She also complains today of an upper respiratory infection and congestion. She has a slight cough. She indicates that her blood sugars at home have been running in the 88-9 he range. Over the past several much she has lost from 216 pounds 288 pounds with diet and exercise and is feeling much better because of this. She did indicate that she somehow rolled out of the bed during the night last night and bruised her left knee and thigh but indicates she is not hurting anywhere else at this time. A repeat blood pressure in the office in the right arm sitting was 136/78. She is due to receive a chest x-ray today. She will be given an FOBT to return. She is also due lab work and a Facilities manager vaccine. She will check with her insurance regarding the Prevnar vaccine.       Patient Active Problem List   Diagnosis Date Noted  . Osteopenia 12/26/2013  . Type 2 diabetes mellitus 10/31/2013  . Hypertension 05/14/2013  . Hyperlipidemia 05/14/2013  . Depression 05/14/2013  . Allergic rhinitis 05/14/2013  . Vitamin D deficiency 05/14/2013  . Hemorrhage of rectum and anus 09/15/2011  . Dysphagia, pharyngoesophageal phase 09/15/2011  . NAFLD (nonalcoholic fatty liver disease) 69/62/9528  . Chronic RUQ pain 09/15/2011  . Diverticulosis of colon (without mention of hemorrhage) 09/15/2011  . BRBPR (bright red blood per rectum) 08/27/2011  . Internal hemorrhoids without mention of complication 08/27/2011  . Esophageal reflux 08/27/2011  . Abdominal pain 08/27/2011  . Esophageal dysphagia 08/27/2011  . Obesity 08/27/2011  . Anxiety and depression 08/27/2011  . 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 albuterol (PROVENTIL, VENTOLIN) (5 MG/ML) 0.5% NEBU Take by nebulization continuous.    Marland Kitchen amitriptyline (ELAVIL) 10 MG tablet Take 10 mg by mouth at bedtime.    Marland Kitchen amLODipine (NORVASC) 10 MG tablet Take 5 mg by mouth daily.   Marland Kitchen aspirin EC 81 MG tablet Take 1 tablet (81 mg total) by mouth daily.  . Calcium 1200-1000 MG-UNIT CHEW Chew by mouth.    . Cholecalciferol (VITAMIN D-3) 5000 UNITS TABS Take by mouth daily.    . fenofibrate 160 MG tablet Take 1 tablet (160 mg total) by mouth daily.  Marland Kitchen losartan-hydrochlorothiazide (HYZAAR) 100-25 MG per tablet Take 1 tablet by mouth daily.   . montelukast (SINGULAIR) 10 MG tablet Take 1 tablet (10 mg total) by mouth daily.  Marland Kitchen omeprazole (PRILOSEC) 20 MG capsule Take 2 capsules (40 mg total) by mouth daily.  . ONE TOUCH ULTRA TEST test strip TEST BLOOD SUGAR 3 TIMES A DAY  . raloxifene (EVISTA) 60 MG tablet Take 1 tablet (60 mg total) by mouth daily.  . SitaGLIPtin-MetFORMIN HCl 50-1000 MG TB24 Take 2 tablets by mouth daily.  Marland Kitchen venlafaxine (EFFEXOR) 75 MG tablet Take 75 mg by mouth 3 (three) times daily.    . [DISCONTINUED] ciprofloxacin (CIPRO) 500 MG tablet Take 1 tablet (500 mg total) by mouth 2 (two) times daily.    Review of Systems  Constitutional: Negative.   HENT: Negative.  Eyes: Negative.   Respiratory: Negative.   Cardiovascular: Negative.   Gastrointestinal: Negative.   Endocrine: Negative.   Genitourinary: Negative.   Musculoskeletal: Negative.   Skin: Negative.   Allergic/Immunologic: Negative.   Neurological: Negative.   Hematological: Negative.   Psychiatric/Behavioral: Negative.        Objective:   Physical Exam  Nursing note and vitals reviewed. Constitutional: She is oriented to person, place, and time. She appears well-developed and well-nourished. No distress.  Positive and pleasant  HENT:  Head: Normocephalic and  atraumatic.  Right Ear: External ear normal.  Left Ear: External ear normal.  Nose: Nose normal.  Mouth/Throat: Oropharynx is clear and moist. No oropharyngeal exudate.  Eyes: Conjunctivae and EOM are normal. Pupils are equal, round, and reactive to light. Right eye exhibits no discharge. Left eye exhibits no discharge. No scleral icterus.  Neck: Normal range of motion. Neck supple. No thyromegaly present.  No carotid bruits or anterior cervical nodes.  Cardiovascular: Normal rate, regular rhythm, normal heart sounds and intact distal pulses.  Exam reveals no gallop and no friction rub.   No murmur heard. At 72 per minute  Pulmonary/Chest: Effort normal and breath sounds normal. No respiratory distress. She has no wheezes. She has no rales. She exhibits no tenderness.  Abdominal: Soft. Bowel sounds are normal. She exhibits no mass. There is no tenderness. There is no rebound and no guarding.  Mild obesity  Musculoskeletal: Normal range of motion. She exhibits no edema and no tenderness.  Lymphadenopathy:    She has no cervical adenopathy.  Neurological: She is alert and oriented to person, place, and time. She has normal reflexes. No cranial nerve deficit.  Skin: Skin is warm and dry. No rash noted.  Psychiatric: She has a normal mood and affect. Her behavior is normal. Judgment and thought content normal.   BP 143/89  Pulse 75  Temp(Src) 99.9 F (37.7 C) (Oral)  Ht 5' (1.524 m)  Wt 188 lb (85.276 kg)  BMI 36.72 kg/m2  [facility] reading (PRIMARY) by  Dr. chest x-ray-- no active disease                                    Assessment & Plan:   1. Hyperlipidemia - Lipid panel  2. Hypertension - POCT UA - Microalbumin - BMP8+EGFR - Hepatic function panel  3. Osteopenia - POCT CBC  4. Type 2 diabetes mellitus - POCT CBC - POCT glycosylated hemoglobin (Hb A1C) - POCT UA - Microalbumin - Microalbumin, urine  5. Vitamin D deficiency - POCT CBC - Vit D  25 hydroxy (rtn  osteoporosis monitoring)  6. UTI (urinary tract infection) - POCT UA - Microscopic Only - POCT urinalysis dipstick - Urine culture  7. Urinary tract infection, site not specified -A urinalysis will be checked as well as a urine culture and sensitivity -Continue with Cipro until culture and sensitivity is returned  Patient Instructions  Continue current medications. Continue good therapeutic lifestyle changes which include good diet and exercise. Fall precautions discussed with patient. If an FOBT was given today- please return it to our front desk. If you are over 53 years old - you may need Prevnar 13 or the adult Pneumonia vaccine.  Check with your insurance regarding the Prevnar vaccine Return  the FOBT Take Mucinex if needed for cough and congestion Use saline nose spray frequently for sinus congestion    Nyra Capes  MD

## 2014-02-26 LAB — BMP8+EGFR
BUN/Creatinine Ratio: 32 — ABNORMAL HIGH (ref 11–26)
BUN: 22 mg/dL (ref 8–27)
CALCIUM: 10.1 mg/dL (ref 8.7–10.3)
CHLORIDE: 98 mmol/L (ref 97–108)
CO2: 24 mmol/L (ref 18–29)
Creatinine, Ser: 0.68 mg/dL (ref 0.57–1.00)
GFR calc Af Amer: 108 mL/min/{1.73_m2} (ref >59)
GFR calc non Af Amer: 93 mL/min/{1.73_m2} (ref >59)
GLUCOSE: 86 mg/dL (ref 65–99)
POTASSIUM: 4.5 mmol/L (ref 3.5–5.2)
Sodium: 139 mmol/L (ref 134–144)

## 2014-02-26 LAB — HEPATIC FUNCTION PANEL
ALBUMIN: 4.7 g/dL (ref 3.6–4.8)
ALK PHOS: 40 IU/L (ref 39–117)
ALT: 25 IU/L (ref 0–32)
AST: 26 IU/L (ref 0–40)
Bilirubin, Direct: 0.11 mg/dL (ref 0.00–0.40)
Total Bilirubin: 0.3 mg/dL (ref 0.0–1.2)
Total Protein: 6.8 g/dL (ref 6.0–8.5)

## 2014-02-26 LAB — LIPID PANEL
Chol/HDL Ratio: 5.2 ratio units — ABNORMAL HIGH (ref 0.0–4.4)
Cholesterol, Total: 197 mg/dL (ref 100–199)
HDL: 38 mg/dL — ABNORMAL LOW (ref >39)
LDL CALC: 112 mg/dL — AB (ref 0–99)
Triglycerides: 233 mg/dL — ABNORMAL HIGH (ref 0–149)
VLDL Cholesterol Cal: 47 mg/dL — ABNORMAL HIGH (ref 5–40)

## 2014-02-26 LAB — MICROALBUMIN, URINE: Microalbumin, Urine: 99.2 ug/mL — ABNORMAL HIGH (ref 0.0–17.0)

## 2014-02-26 LAB — VITAMIN D 25 HYDROXY (VIT D DEFICIENCY, FRACTURES): Vit D, 25-Hydroxy: 34.3 ng/mL (ref 30.0–100.0)

## 2014-02-27 ENCOUNTER — Other Ambulatory Visit: Payer: Self-pay | Admitting: *Deleted

## 2014-02-27 LAB — URINE CULTURE

## 2014-02-27 MED ORDER — NITROFURANTOIN MACROCRYSTAL 100 MG PO CAPS: 100.0000 mg | ORAL_CAPSULE | Freq: Two times a day (BID) | ORAL | Status: DC

## 2014-03-27 ENCOUNTER — Telehealth: Payer: Self-pay | Admitting: *Deleted

## 2014-03-27 NOTE — Telephone Encounter (Signed)
Please switch to the appropriate strength of, glottis for this patient call a prescription in as directed by the clinical pharmacist

## 2014-03-27 NOTE — Telephone Encounter (Signed)
Patient is using samples for the time being. Her blood sugar is very well controlled and Tammy has discussed switching her to plain Metformin. She is aware that her insurance will not cover this as prescribed.   I will see what strength Metformin Tammy suggests.

## 2014-03-27 NOTE — Telephone Encounter (Signed)
Ins denied coverage to janumet XR.  They will only cover if pt has had a 27month trial and failure to ALL of drugs on their formulary.  The drugs are Kombiglyze XR, Jentadueto, and Kanzo.. Will you handle please?

## 2014-03-27 NOTE — Telephone Encounter (Signed)
Melissa Bowman,  I would switch to metformin 1000mg  1 tablet twice a day

## 2014-03-28 NOTE — Telephone Encounter (Signed)
Ok. Will order when patient needs a refill.

## 2014-05-01 ENCOUNTER — Other Ambulatory Visit: Payer: Self-pay | Admitting: Family Medicine

## 2014-05-07 ENCOUNTER — Other Ambulatory Visit: Payer: Self-pay

## 2014-05-07 MED ORDER — LOSARTAN POTASSIUM-HCTZ 100-25 MG PO TABS: ORAL_TABLET | ORAL | Status: DC

## 2014-05-07 MED ORDER — AMLODIPINE BESYLATE 10 MG PO TABS: ORAL_TABLET | ORAL | Status: DC

## 2014-05-07 MED ORDER — AMITRIPTYLINE HCL 10 MG PO TABS: ORAL_TABLET | ORAL | Status: DC

## 2014-05-07 MED ORDER — MONTELUKAST SODIUM 10 MG PO TABS: ORAL_TABLET | ORAL | Status: DC

## 2014-05-07 MED ORDER — OMEPRAZOLE 40 MG PO CPDR: DELAYED_RELEASE_CAPSULE | ORAL | Status: DC

## 2014-05-07 MED ORDER — FENOFIBRATE 160 MG PO TABS: ORAL_TABLET | ORAL | Status: DC

## 2014-05-09 ENCOUNTER — Other Ambulatory Visit: Payer: 59

## 2014-05-09 DIAGNOSIS — Z1212 Encounter for screening for malignant neoplasm of rectum: Secondary | ICD-10-CM

## 2014-05-09 NOTE — Progress Notes (Signed)
Pt came in for labs only 

## 2014-05-10 LAB — FECAL OCCULT BLOOD, IMMUNOCHEMICAL: Fecal Occult Bld: NEGATIVE

## 2014-05-13 ENCOUNTER — Other Ambulatory Visit: Payer: Self-pay | Admitting: Family Medicine

## 2014-05-14 ENCOUNTER — Encounter: Payer: Self-pay | Admitting: *Deleted

## 2014-05-22 ENCOUNTER — Other Ambulatory Visit: Payer: Self-pay | Admitting: *Deleted

## 2014-05-22 MED ORDER — METFORMIN HCL 1000 MG PO TABS: 1000.0000 mg | ORAL_TABLET | Freq: Two times a day (BID) | ORAL | Status: DC

## 2014-05-23 ENCOUNTER — Encounter: Payer: Self-pay | Admitting: Family Medicine

## 2014-05-23 ENCOUNTER — Ambulatory Visit (INDEPENDENT_AMBULATORY_CARE_PROVIDER_SITE_OTHER): Payer: 59 | Admitting: Family Medicine

## 2014-05-23 ENCOUNTER — Telehealth: Payer: Self-pay | Admitting: Nurse Practitioner

## 2014-05-23 VITALS — BP 147/80 | HR 86 | Temp 100.8°F | Ht 60.0 in

## 2014-05-23 DIAGNOSIS — L989 Disorder of the skin and subcutaneous tissue, unspecified: Secondary | ICD-10-CM

## 2014-05-23 DIAGNOSIS — N390 Urinary tract infection, site not specified: Secondary | ICD-10-CM

## 2014-05-23 DIAGNOSIS — R300 Dysuria: Secondary | ICD-10-CM

## 2014-05-23 LAB — POCT UA - MICROSCOPIC ONLY
CASTS, UR, LPF, POC: NEGATIVE
Crystals, Ur, HPF, POC: NEGATIVE
Yeast, UA: NEGATIVE

## 2014-05-23 LAB — POCT URINALYSIS DIPSTICK
BILIRUBIN UA: NEGATIVE
Glucose, UA: NEGATIVE
Ketones, UA: NEGATIVE
Nitrite, UA: POSITIVE
Spec Grav, UA: 1.015
Urobilinogen, UA: NEGATIVE
pH, UA: 7

## 2014-05-23 LAB — POCT CBC
Granulocyte percent: 59.6 %G (ref 37–80)
HEMATOCRIT: 40.4 % (ref 37.7–47.9)
HEMOGLOBIN: 13 g/dL (ref 12.2–16.2)
Lymph, poc: 3.1 (ref 0.6–3.4)
MCH: 29.5 pg (ref 27–31.2)
MCHC: 32.1 g/dL (ref 31.8–35.4)
MCV: 92.1 fL (ref 80–97)
MPV: 7.3 fL (ref 0–99.8)
POC Granulocyte: 5.2 (ref 2–6.9)
POC LYMPH PERCENT: 35.4 %L (ref 10–50)
Platelet Count, POC: 307 10*3/uL (ref 142–424)
RBC: 4.4 M/uL (ref 4.04–5.48)
RDW, POC: 12.9 %
WBC: 8.7 10*3/uL (ref 4.6–10.2)

## 2014-05-23 MED ORDER — CIPROFLOXACIN HCL 500 MG PO TABS: 500.0000 mg | ORAL_TABLET | Freq: Two times a day (BID) | ORAL | Status: DC

## 2014-05-23 MED ORDER — PHENAZOPYRIDINE HCL 200 MG PO TABS: 200.0000 mg | ORAL_TABLET | Freq: Three times a day (TID) | ORAL | Status: DC | PRN

## 2014-05-23 NOTE — Patient Instructions (Addendum)
Drink plenty of fluids Take antibiotic as directed Monitor skin lesions to see if they improve with the antibiotic We will schedule you an appointment with a urologist because of the frequency of your urinary tract infections We will also do a culture and sensitivity and if the antibiotic choices not appropriate. We will change to a different antibiotic Recheck urinalysis in 10-14 days

## 2014-05-23 NOTE — Addendum Note (Signed)
Addended by: Ilean China on: 05/23/2014 02:56 PM   Modules accepted: Orders

## 2014-05-23 NOTE — Telephone Encounter (Signed)
appt scheduled

## 2014-05-23 NOTE — Progress Notes (Signed)
 Subjective:    Patient ID: Melissa Bowman, female    DOB: 06/25/1951, 63 y.o.   MRN: [id number]  HPI Patient comes in today with UTI symptoms. She complains of dysuria, frequency and urinary odor for several days. She has taken AZO. Last taken 2 days ago.    Review of Systems  Constitutional: Positive for fever.  Genitourinary: Positive for dysuria and frequency. Negative for flank pain and pelvic pain.  All other systems reviewed and are negative.      Objective:   Physical Exam  Nursing note and vitals reviewed. Constitutional: She is oriented to person, place, and time. She appears well-developed and well-nourished. No distress.  HENT:  Head: Normocephalic.  Abdominal: Soft. Bowel sounds are normal. She exhibits no distension and no mass. There is tenderness. There is no rebound and no guarding.  Neurological: She is alert and oriented to person, place, and time.  Skin: Skin is warm and dry. Rash noted.  2 blister-type lesions one on the front right abdomen and one on the left flank. The one on the left flank was drying the one on the right abdomen was more blistered.  Psychiatric: She has a normal mood and affect. Her behavior is normal. Judgment and thought content normal.   BP 147/80  Pulse 86  Temp(Src) 100.8 F (38.2 C) (Oral)  Ht 5' (1.524 m)  Results for orders placed in visit on 05/23/14  POCT URINALYSIS DIPSTICK      Result Value Ref Range   Color, UA yellow     Clarity, UA cloudy     Glucose, UA neg     Bilirubin, UA neg     Ketones, UA neg     Spec Grav, UA 1.015     Blood, UA mod     pH, UA 7.0     Protein, UA 2++     Urobilinogen, UA negative     Nitrite, UA pos     Leukocytes, UA large (3+)    POCT UA - MICROSCOPIC ONLY      Result Value Ref Range   WBC, Ur, HPF, POC TNTC     RBC, urine, microscopic TNTC     Bacteria, U Microscopic MANY     Mucus, UA OCC     Epithelial cells, urine per micros OCC     Crystals, Ur, HPF, POC NEG     Casts,  Ur, LPF, POC NEG     Yeast, UA NEG     The patient will also have a CBC and BMP done      Assessment & Plan:  1. Dysuria - POCT urinalysis dipstick - POCT UA - Microscopic Only - Urine culture - ciprofloxacin (CIPRO) 500 MG tablet; Take 1 tablet (500 mg total) by mouth 2 (two) times daily.  Dispense: 14 tablet; Refill: 0 - phenazopyridine (PYRIDIUM) 200 MG tablet; Take 1 tablet (200 mg total) by mouth 3 (three) times daily as needed for pain.  Dispense: 10 tablet; Refill: 0  2. Recurrent UTI - Ambulatory referral to Urology - ciprofloxacin (CIPRO) 500 MG tablet; Take 1 tablet (500 mg total) by mouth 2 (two) times daily.  Dispense: 14 tablet; Refill: 0  3. UTI (lower urinary tract infection) - POCT CBC - BMP8+EGFR - ciprofloxacin (CIPRO) 500 MG tablet; Take 1 tablet (500 mg total) by mouth 2 (two) times daily.  Dispense: 14 tablet; Refill: 0  4. Skin lesion of back  Patient Instructions  Drink plenty of fluids Take antibiotic   Subjective:    Patient ID: Melissa Bowman, female    DOB: [birth year], 63 y.o.   MRN: [id number]  HPI Patient comes in today with UTI symptoms. She complains of dysuria, frequency and urinary odor for several days. She has taken AZO. Last taken 2 days ago.    Review of Systems  Constitutional: Positive for fever.  Genitourinary: Positive for dysuria and frequency. Negative for flank pain and pelvic pain.  All other systems reviewed and are negative.      Objective:   Physical Exam  Nursing note and vitals reviewed. Constitutional: She is oriented to person, place, and time. She appears well-developed and well-nourished. No distress.  HENT:  Head: Normocephalic.  Abdominal: Soft. Bowel sounds are normal. She exhibits no distension and no mass. There is tenderness. There is no rebound and no guarding.  Neurological: She is alert and oriented to person, place, and time.  Skin: Skin is warm and dry. Rash noted.  2 blister-type lesions one on the front right abdomen and one on the left flank. The one on the left flank was drying the one on the right abdomen was more blistered.  Psychiatric: She has a normal mood and affect. Her behavior is normal. Judgment and thought content normal.   BP 147/80  Pulse 86  Temp(Src) 100.8 F (38.2 C) (Oral)  Ht 5' (1.524 m)  Results for orders placed in visit on 05/23/14  POCT URINALYSIS DIPSTICK      Result Value Ref Range   Color, UA yellow     Clarity, UA cloudy     Glucose, UA neg     Bilirubin, UA neg     Ketones, UA neg     Spec Grav, UA 1.015     Blood, UA mod     pH, UA 7.0     Protein, UA 2++     Urobilinogen, UA negative     Nitrite, UA pos     Leukocytes, UA large (3+)    POCT UA - MICROSCOPIC ONLY      Result Value Ref Range   WBC, Ur, HPF, POC TNTC     RBC, urine, microscopic TNTC     Bacteria, U Microscopic MANY     Mucus, UA OCC     Epithelial cells, urine per micros OCC     Crystals, Ur, HPF, POC NEG     Casts,  Ur, LPF, POC NEG     Yeast, UA NEG     The patient will also have a CBC and BMP done      Assessment & Plan:  1. Dysuria - POCT urinalysis dipstick - POCT UA - Microscopic Only - Urine culture - ciprofloxacin (CIPRO) 500 MG tablet; Take 1 tablet (500 mg total) by mouth 2 (two) times daily.  Dispense: 14 tablet; Refill: 0 - phenazopyridine (PYRIDIUM) 200 MG tablet; Take 1 tablet (200 mg total) by mouth 3 (three) times daily as needed for pain.  Dispense: 10 tablet; Refill: 0  2. Recurrent UTI - Ambulatory referral to Urology - ciprofloxacin (CIPRO) 500 MG tablet; Take 1 tablet (500 mg total) by mouth 2 (two) times daily.  Dispense: 14 tablet; Refill: 0  3. UTI (lower urinary tract infection) - POCT CBC - BMP8+EGFR - ciprofloxacin (CIPRO) 500 MG tablet; Take 1 tablet (500 mg total) by mouth 2 (two) times daily.  Dispense: 14 tablet; Refill: 0  4. Skin lesion of back  Patient Instructions  Drink plenty of fluids Take antibiotic

## 2014-05-24 LAB — BMP8+EGFR
BUN/Creatinine Ratio: 25 (ref 11–26)
BUN: 15 mg/dL (ref 8–27)
CALCIUM: 10 mg/dL (ref 8.7–10.3)
CO2: 20 mmol/L (ref 18–29)
CREATININE: 0.6 mg/dL (ref 0.57–1.00)
Chloride: 99 mmol/L (ref 97–108)
GFR, EST AFRICAN AMERICAN: 112 mL/min/{1.73_m2} (ref >59)
GFR, EST NON AFRICAN AMERICAN: 97 mL/min/{1.73_m2} (ref >59)
Glucose: 118 mg/dL — ABNORMAL HIGH (ref 65–99)
POTASSIUM: 3.9 mmol/L (ref 3.5–5.2)
Sodium: 141 mmol/L (ref 134–144)

## 2014-05-25 LAB — URINE CULTURE

## 2014-06-05 ENCOUNTER — Other Ambulatory Visit: Payer: Self-pay | Admitting: *Deleted

## 2014-06-06 ENCOUNTER — Ambulatory Visit: Payer: 59 | Admitting: Nurse Practitioner

## 2014-06-26 ENCOUNTER — Other Ambulatory Visit: Payer: Self-pay | Admitting: Urology

## 2014-06-26 DIAGNOSIS — N3020 Other chronic cystitis without hematuria: Secondary | ICD-10-CM

## 2014-07-01 ENCOUNTER — Ambulatory Visit (HOSPITAL_COMMUNITY)
Admission: RE | Admit: 2014-07-01 | Discharge: 2014-07-01 | Disposition: A | Discharge status: Home / Self Care | Payer: 59 | Source: Ambulatory Visit | Attending: Urology | Admitting: Urology

## 2014-07-01 ENCOUNTER — Ambulatory Visit: Payer: 59 | Admitting: Family Medicine

## 2014-07-01 DIAGNOSIS — Q619 Cystic kidney disease, unspecified: Secondary | ICD-10-CM | POA: Insufficient documentation

## 2014-07-01 DIAGNOSIS — N3020 Other chronic cystitis without hematuria: Secondary | ICD-10-CM | POA: Insufficient documentation

## 2014-07-04 ENCOUNTER — Ambulatory Visit (INDEPENDENT_AMBULATORY_CARE_PROVIDER_SITE_OTHER): Payer: 59

## 2014-07-04 ENCOUNTER — Ambulatory Visit (INDEPENDENT_AMBULATORY_CARE_PROVIDER_SITE_OTHER): Payer: 59 | Admitting: Family Medicine

## 2014-07-04 ENCOUNTER — Encounter: Payer: Self-pay | Admitting: Family Medicine

## 2014-07-04 VITALS — BP 139/81 | HR 80 | Temp 100.7°F | Ht 60.0 in | Wt 192.0 lb

## 2014-07-04 DIAGNOSIS — F329 Major depressive disorder, single episode, unspecified: Secondary | ICD-10-CM

## 2014-07-04 DIAGNOSIS — M25559 Pain in unspecified hip: Secondary | ICD-10-CM

## 2014-07-04 DIAGNOSIS — E119 Type 2 diabetes mellitus without complications: Secondary | ICD-10-CM

## 2014-07-04 DIAGNOSIS — M25552 Pain in left hip: Secondary | ICD-10-CM

## 2014-07-04 DIAGNOSIS — E785 Hyperlipidemia, unspecified: Secondary | ICD-10-CM

## 2014-07-04 DIAGNOSIS — I10 Essential (primary) hypertension: Secondary | ICD-10-CM

## 2014-07-04 DIAGNOSIS — E559 Vitamin D deficiency, unspecified: Secondary | ICD-10-CM

## 2014-07-04 DIAGNOSIS — Z0000 Encounter for general adult medical examination without abnormal findings: Secondary | ICD-10-CM

## 2014-07-04 DIAGNOSIS — F3289 Other specified depressive episodes: Secondary | ICD-10-CM

## 2014-07-04 DIAGNOSIS — F32A Depression, unspecified: Secondary | ICD-10-CM

## 2014-07-04 LAB — POCT CBC
Granulocyte percent: 42.1 %G (ref 37–80)
HEMATOCRIT: 39.8 % (ref 37.7–47.9)
Hemoglobin: 13.3 g/dL (ref 12.2–16.2)
LYMPH, POC: 3.5 — AB (ref 0.6–3.4)
MCH: 31 pg (ref 27–31.2)
MCHC: 33.4 g/dL (ref 31.8–35.4)
MCV: 92.7 fL (ref 80–97)
MPV: 7.8 fL (ref 0–99.8)
POC Granulocyte: 2.8 (ref 2–6.9)
POC LYMPH PERCENT: 53.5 %L — AB (ref 10–50)
Platelet Count, POC: 296 10*3/uL (ref 142–424)
RBC: 4.3 M/uL (ref 4.04–5.48)
RDW, POC: 13.2 %
WBC: 6.6 10*3/uL (ref 4.6–10.2)

## 2014-07-04 LAB — POCT GLYCOSYLATED HEMOGLOBIN (HGB A1C): Hemoglobin A1C: 5

## 2014-07-04 NOTE — Progress Notes (Signed)
Subjective:    Patient ID: Melissa Bowman, female    DOB: 03-Sep-1951, 63 y.o.   MRN: [id number]  HPI Patient is here today for annual wellness exam and follow up of chronic medical problems. The patient does complain of some left hip pain. This pain is worse when sitting and after arising from a sitting position. The patient did bring home blood pressures in for review and all of these were excellent. The be scanned into the record       Patient Active Problem List   Diagnosis Date Noted  . Osteopenia 12/26/2013  . Type 2 diabetes mellitus 10/31/2013  . Hypertension 05/14/2013  . Hyperlipidemia 05/14/2013  . Depression 05/14/2013  . Allergic rhinitis 05/14/2013  . Vitamin D deficiency 05/14/2013  . Hemorrhage of rectum and anus 09/15/2011  . Dysphagia, pharyngoesophageal phase 09/15/2011  . NAFLD (nonalcoholic fatty liver disease) 40/98/1191  . Chronic RUQ pain 09/15/2011  . Diverticulosis of colon (without mention of hemorrhage) 09/15/2011  . BRBPR (bright red blood per rectum) 08/27/2011  . Internal hemorrhoids without mention of complication 08/27/2011  . Esophageal reflux 08/27/2011  . Abdominal pain 08/27/2011  . Esophageal dysphagia 08/27/2011  . Obesity 08/27/2011  . Anxiety and depression 08/27/2011  . 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 albuterol (PROVENTIL, VENTOLIN) (5 MG/ML) 0.5% NEBU Take by nebulization continuous.    Marland Kitchen amitriptyline (ELAVIL) 10 MG tablet Take 1 tablet by mouth at  bedtime  . amLODipine (NORVASC) 10 MG tablet Take 1 tablet by mouth  daily for blood pressure as directed  . aspirin EC 81 MG tablet Take 1 tablet (81 mg total) by mouth daily.  . Calcium 1200-1000 MG-UNIT CHEW Chew by mouth.    . Cholecalciferol (VITAMIN D-3) 5000 UNITS TABS Take by mouth daily.    . fenofibrate 160 MG tablet Take 1 tablet by mouth  daily  . losartan-hydrochlorothiazide  (HYZAAR) 100-25 MG per tablet Take 1 tablet by mouth  daily as directed  . metFORMIN (GLUCOPHAGE) 1000 MG tablet Take 1 tablet (1,000 mg total) by mouth 2 (two) times daily with a meal.  . montelukast (SINGULAIR) 10 MG tablet Take 1 tablet by mouth  daily  . omeprazole (PRILOSEC) 20 MG capsule Take 2 capsules (40 mg total) by mouth daily.  . ONE TOUCH ULTRA TEST test strip TEST BLOOD SUGAR 3 TIMES A DAY  . raloxifene (EVISTA) 60 MG tablet Take 1 tablet (60 mg total) by mouth daily.  Marland Kitchen venlafaxine (EFFEXOR) 75 MG tablet Take 75 mg by mouth 3 (three) times daily.    . [DISCONTINUED] ciprofloxacin (CIPRO) 500 MG tablet Take 1 tablet (500 mg total) by mouth 2 (two) times daily.  . [DISCONTINUED] phenazopyridine (PYRIDIUM) 200 MG tablet Take 1 tablet (200 mg total) by mouth 3 (three) times daily as needed for pain.  . [DISCONTINUED] omeprazole (PRILOSEC) 40 MG capsule Take 1 capsule by mouth  daily  . [DISCONTINUED] SitaGLIPtin-MetFORMIN HCl 50-1000 MG TB24 Take 2 tablets by mouth daily.    Review of Systems  Constitutional: Negative.   HENT: Negative.   Eyes: Negative.   Respiratory: Negative.   Cardiovascular: Negative.   Gastrointestinal: Negative.   Endocrine: Negative.   Genitourinary: Negative.   Musculoskeletal: Arthralgias: left hip pain with sitting.  Skin: Negative.   Allergic/Immunologic: Negative.   Neurological: Negative.   Hematological: Negative.   Psychiatric/Behavioral: Negative.  Objective:   Physical Exam  Nursing note and vitals reviewed. Constitutional: She is oriented to person, place, and time. She appears well-developed and well-nourished. No distress.  HENT:  Head: Normocephalic and atraumatic.  Right Ear: External ear normal.  Left Ear: External ear normal.  Nose: Nose normal.  Mouth/Throat: Oropharynx is clear and moist. No oropharyngeal exudate.  There is nasal pallor but good clear passages   Eyes: Conjunctivae and EOM are normal. Pupils are  equal, round, and reactive to light. Right eye exhibits no discharge. Left eye exhibits no discharge. No scleral icterus.  Neck: Normal range of motion. Neck supple. No thyromegaly present.   No carotid bruits  Cardiovascular: Normal rate, regular rhythm, normal heart sounds and intact distal pulses.  Exam reveals no gallop and no friction rub.   No murmur heard. At 72 per minute  Pulmonary/Chest: Effort normal and breath sounds normal. No respiratory distress. She has no wheezes. She has no rales. She exhibits no tenderness.  No axillary adenopathy or masses  Abdominal: Soft. Bowel sounds are normal. She exhibits no mass. There is no tenderness. There is no rebound and no guarding.  Obesity present no abdominal bruits  Musculoskeletal: Normal range of motion. She exhibits no edema and no tenderness.  Left posterior hip tenderness. Fairly good range of motion except some discomfort with arising from a sitting position  Lymphadenopathy:    She has no cervical adenopathy.  Neurological: She is alert and oriented to person, place, and time. She has normal reflexes. No cranial nerve deficit.  Skin: Skin is warm and dry. No rash noted.  Psychiatric: She has a normal mood and affect. Her behavior is normal. Judgment and thought content normal.   BP 139/81  Pulse 80  Temp(Src) 100.7 F (38.2 C) (Oral)  Ht 5' (1.524 m)  Wt 192 lb (87.091 kg)  BMI 37.50 kg/m2  [facility] reading (PRIMARY) by  Dr. Shawn Stall hip films-- no active disease                                     Assessment & Plan:  1. Vitamin D deficiency - POCT CBC - Vit D  25 hydroxy (rtn osteoporosis monitoring) - Lipid panel  2. Type 2 diabetes mellitus without complication - POCT CBC - POCT glycosylated hemoglobin (Hb A1C) - Lipid panel  3. Essential hypertension - POCT CBC - BMP8+EGFR - Hepatic function panel - Lipid panel  4. Hyperlipidemia - POCT CBC - Lipid panel  5. Depression - POCT CBC - Lipid panel  6.  Annual physical exam - POCT CBC - Lipid panel  7. Left hip pain - DG Hip Complete Left; Future - Lipid panel  No orders of the defined types were placed in this encounter.   Patient Instructions  Continue current medications. Continue good therapeutic lifestyle changes which include good diet and exercise. Fall precautions discussed with patient. If an FOBT was given today- please return it to our front desk. If you are over 23 years old - you may need Prevnar 13 or the adult Pneumonia vaccine.  Continue to monitor your blood sugars and blood pressures regularly Continue regular exercise Continue drinking plenty of water Always check your feet regularly   Nyra Capes MD

## 2014-07-04 NOTE — Patient Instructions (Addendum)
Continue current medications. Continue good therapeutic lifestyle changes which include good diet and exercise. Fall precautions discussed with patient. If an FOBT was given today- please return it to our front desk. If you are over 64 years old - you may need Prevnar 82 or the adult Pneumonia vaccine.  Continue to monitor your blood sugars and blood pressures regularly Continue regular exercise Continue drinking plenty of water Always check your feet regularly

## 2014-07-05 LAB — BMP8+EGFR
BUN / CREAT RATIO: 24 (ref 11–26)
BUN: 16 mg/dL (ref 8–27)
CO2: 23 mmol/L (ref 18–29)
CREATININE: 0.67 mg/dL (ref 0.57–1.00)
Calcium: 10.2 mg/dL (ref 8.7–10.3)
Chloride: 96 mmol/L — ABNORMAL LOW (ref 97–108)
GFR calc non Af Amer: 94 mL/min/{1.73_m2} (ref >59)
GFR, EST AFRICAN AMERICAN: 108 mL/min/{1.73_m2} (ref >59)
GLUCOSE: 85 mg/dL (ref 65–99)
Potassium: 4.2 mmol/L (ref 3.5–5.2)
Sodium: 140 mmol/L (ref 134–144)

## 2014-07-05 LAB — HEPATIC FUNCTION PANEL
ALBUMIN: 5 g/dL — AB (ref 3.6–4.8)
ALT: 41 IU/L — ABNORMAL HIGH (ref 0–32)
AST: 32 IU/L (ref 0–40)
Alkaline Phosphatase: 40 IU/L (ref 39–117)
BILIRUBIN TOTAL: 0.3 mg/dL (ref 0.0–1.2)
Bilirubin, Direct: 0.11 mg/dL (ref 0.00–0.40)
TOTAL PROTEIN: 6.9 g/dL (ref 6.0–8.5)

## 2014-07-05 LAB — VITAMIN D 25 HYDROXY (VIT D DEFICIENCY, FRACTURES): Vit D, 25-Hydroxy: 27.6 ng/mL — ABNORMAL LOW (ref 30.0–100.0)

## 2014-07-05 LAB — LIPID PANEL
CHOLESTEROL TOTAL: 210 mg/dL — AB (ref 100–199)
Chol/HDL Ratio: 5.3 ratio units — ABNORMAL HIGH (ref 0.0–4.4)
HDL: 40 mg/dL (ref >39)
LDL Calculated: 121 mg/dL — ABNORMAL HIGH (ref 0–99)
Triglycerides: 247 mg/dL — ABNORMAL HIGH (ref 0–149)
VLDL CHOLESTEROL CAL: 49 mg/dL — AB (ref 5–40)

## 2014-07-26 ENCOUNTER — Ambulatory Visit (INDEPENDENT_AMBULATORY_CARE_PROVIDER_SITE_OTHER): Payer: 59 | Admitting: Urology

## 2014-07-26 DIAGNOSIS — Z87440 Personal history of urinary (tract) infections: Secondary | ICD-10-CM

## 2014-07-26 DIAGNOSIS — N200 Calculus of kidney: Secondary | ICD-10-CM

## 2014-07-26 DIAGNOSIS — N952 Postmenopausal atrophic vaginitis: Secondary | ICD-10-CM

## 2014-07-29 ENCOUNTER — Ambulatory Visit: Payer: 59 | Attending: Orthopedic Surgery | Admitting: Physical Therapy

## 2014-07-29 DIAGNOSIS — M719 Bursopathy, unspecified: Secondary | ICD-10-CM | POA: Insufficient documentation

## 2014-07-29 DIAGNOSIS — M6790 Unspecified disorder of synovium and tendon, unspecified site: Secondary | ICD-10-CM | POA: Insufficient documentation

## 2014-07-29 DIAGNOSIS — IMO0001 Reserved for inherently not codable concepts without codable children: Secondary | ICD-10-CM | POA: Insufficient documentation

## 2014-08-01 ENCOUNTER — Ambulatory Visit: Payer: 59 | Admitting: *Deleted

## 2014-08-01 DIAGNOSIS — IMO0001 Reserved for inherently not codable concepts without codable children: Secondary | ICD-10-CM | POA: Diagnosis not present

## 2014-08-06 ENCOUNTER — Ambulatory Visit: Payer: 59 | Admitting: *Deleted

## 2014-08-06 DIAGNOSIS — IMO0001 Reserved for inherently not codable concepts without codable children: Secondary | ICD-10-CM | POA: Diagnosis not present

## 2014-08-07 ENCOUNTER — Encounter: Payer: 59 | Admitting: Physical Therapy

## 2014-08-08 ENCOUNTER — Ambulatory Visit: Payer: 59 | Admitting: Physical Therapy

## 2014-08-08 DIAGNOSIS — IMO0001 Reserved for inherently not codable concepts without codable children: Secondary | ICD-10-CM | POA: Diagnosis not present

## 2014-08-11 ENCOUNTER — Other Ambulatory Visit: Payer: Self-pay | Admitting: Family Medicine

## 2014-08-12 ENCOUNTER — Ambulatory Visit: Payer: 59 | Admitting: Physical Therapy

## 2014-08-12 DIAGNOSIS — IMO0001 Reserved for inherently not codable concepts without codable children: Secondary | ICD-10-CM | POA: Diagnosis not present

## 2014-08-15 ENCOUNTER — Ambulatory Visit: Payer: 59 | Attending: Orthopedic Surgery | Admitting: Physical Therapy

## 2014-08-15 DIAGNOSIS — M719 Bursopathy, unspecified: Secondary | ICD-10-CM | POA: Diagnosis not present

## 2014-08-15 DIAGNOSIS — IMO0001 Reserved for inherently not codable concepts without codable children: Secondary | ICD-10-CM | POA: Insufficient documentation

## 2014-08-15 DIAGNOSIS — M6790 Unspecified disorder of synovium and tendon, unspecified site: Secondary | ICD-10-CM | POA: Insufficient documentation

## 2014-08-21 ENCOUNTER — Ambulatory Visit: Payer: 59 | Admitting: Physical Therapy

## 2014-08-21 DIAGNOSIS — IMO0001 Reserved for inherently not codable concepts without codable children: Secondary | ICD-10-CM | POA: Diagnosis not present

## 2014-09-16 ENCOUNTER — Other Ambulatory Visit: Payer: Self-pay | Admitting: *Deleted

## 2014-09-16 ENCOUNTER — Telehealth: Payer: Self-pay | Admitting: *Deleted

## 2014-09-16 MED ORDER — FLUTICASONE PROPIONATE 50 MCG/ACT NA SUSP: 2.0000 | Freq: Every day | NASAL | Status: DC

## 2014-09-16 NOTE — Telephone Encounter (Signed)
Patient request refill on Flonase. Please advise

## 2014-09-16 NOTE — Addendum Note (Signed)
Addended by: Waverly Ferrari on: 09/16/2014 01:14 PM   Modules accepted: Orders

## 2014-09-16 NOTE — Telephone Encounter (Signed)
Sent rx to pharmacy. Patient notified

## 2014-09-16 NOTE — Telephone Encounter (Signed)
This is okay to refill for one year 

## 2014-09-17 ENCOUNTER — Other Ambulatory Visit: Payer: Self-pay | Admitting: Family Medicine

## 2014-09-18 ENCOUNTER — Other Ambulatory Visit: Payer: Self-pay | Admitting: Family Medicine

## 2014-09-27 ENCOUNTER — Ambulatory Visit (INDEPENDENT_AMBULATORY_CARE_PROVIDER_SITE_OTHER): Payer: 59

## 2014-09-27 ENCOUNTER — Other Ambulatory Visit: Payer: Self-pay | Admitting: *Deleted

## 2014-09-27 DIAGNOSIS — Z23 Encounter for immunization: Secondary | ICD-10-CM

## 2014-10-03 ENCOUNTER — Other Ambulatory Visit: Payer: Self-pay | Admitting: *Deleted

## 2014-10-03 MED ORDER — BUDESONIDE-FORMOTEROL FUMARATE 160-4.5 MCG/ACT IN AERO: 2.0000 | INHALATION_SPRAY | Freq: Two times a day (BID) | RESPIRATORY_TRACT | Status: DC

## 2014-10-03 MED ORDER — ALBUTEROL SULFATE HFA 108 (90 BASE) MCG/ACT IN AERS: 2.0000 | INHALATION_SPRAY | Freq: Four times a day (QID) | RESPIRATORY_TRACT | Status: DC | PRN

## 2014-11-05 ENCOUNTER — Encounter: Payer: Self-pay | Admitting: Family Medicine

## 2014-11-05 ENCOUNTER — Ambulatory Visit (INDEPENDENT_AMBULATORY_CARE_PROVIDER_SITE_OTHER): Payer: 59 | Admitting: Family Medicine

## 2014-11-05 VITALS — BP 139/84 | HR 66 | Temp 97.9°F | Ht 60.0 in | Wt 194.0 lb

## 2014-11-05 DIAGNOSIS — R109 Unspecified abdominal pain: Secondary | ICD-10-CM

## 2014-11-05 DIAGNOSIS — K219 Gastro-esophageal reflux disease without esophagitis: Secondary | ICD-10-CM

## 2014-11-05 DIAGNOSIS — F329 Major depressive disorder, single episode, unspecified: Secondary | ICD-10-CM

## 2014-11-05 DIAGNOSIS — E785 Hyperlipidemia, unspecified: Secondary | ICD-10-CM

## 2014-11-05 DIAGNOSIS — F32A Depression, unspecified: Secondary | ICD-10-CM

## 2014-11-05 DIAGNOSIS — E559 Vitamin D deficiency, unspecified: Secondary | ICD-10-CM

## 2014-11-05 DIAGNOSIS — I10 Essential (primary) hypertension: Secondary | ICD-10-CM

## 2014-11-05 DIAGNOSIS — E119 Type 2 diabetes mellitus without complications: Secondary | ICD-10-CM

## 2014-11-05 LAB — POCT CBC
GRANULOCYTE PERCENT: 49.2 % (ref 37–80)
HCT, POC: 39.4 % (ref 37.7–47.9)
HEMOGLOBIN: 12.8 g/dL (ref 12.2–16.2)
Lymph, poc: 2.5 (ref 0.6–3.4)
MCH, POC: 29.7 pg (ref 27–31.2)
MCHC: 32.5 g/dL (ref 31.8–35.4)
MCV: 91.4 fL (ref 80–97)
MPV: 7 fL (ref 0–99.8)
POC GRANULOCYTE: 2.8 (ref 2–6.9)
POC LYMPH PERCENT: 44.5 %L (ref 10–50)
Platelet Count, POC: 286 10*3/uL (ref 142–424)
RBC: 4.3 M/uL (ref 4.04–5.48)
RDW, POC: 13.4 %
WBC: 5.7 10*3/uL (ref 4.6–10.2)

## 2014-11-05 LAB — POCT GLYCOSYLATED HEMOGLOBIN (HGB A1C): Hemoglobin A1C: 5.2

## 2014-11-05 MED ORDER — AMITRIPTYLINE HCL 10 MG PO TABS: ORAL_TABLET | ORAL | Status: DC

## 2014-11-05 NOTE — Progress Notes (Signed)
Subjective:    Patient ID: Melissa Bowman, female    DOB: May 18, 1951, 63 y.o.   MRN: [id number]  HPI Pt here for follow up and management of chronic medical problems. The patient comes in today for routine follow-up. She is up-to-date on her health maintenance parameters. She is due to get lab work today and has an appointment scheduled soon. She brings in blood sugars and blood pressures for review. The blood sugars overall are looking very good and the blood pressures are also good.          Patient Active Problem List   Diagnosis Date Noted  . Osteopenia 12/26/2013  . Type 2 diabetes mellitus 10/31/2013  . Hypertension 05/14/2013  . Hyperlipidemia 05/14/2013  . Depression 05/14/2013  . Allergic rhinitis 05/14/2013  . Vitamin D deficiency 05/14/2013  . Hemorrhage of rectum and anus 09/15/2011  . Dysphagia, pharyngoesophageal phase 09/15/2011  . NAFLD (nonalcoholic fatty liver disease) 40/98/1191  . Chronic RUQ pain 09/15/2011  . Diverticulosis of colon (without mention of hemorrhage) 09/15/2011  . BRBPR (bright red blood per rectum) 08/27/2011  . Internal hemorrhoids without mention of complication 08/27/2011  . Esophageal reflux 08/27/2011  . Abdominal pain 08/27/2011  . Esophageal dysphagia 08/27/2011  . Obesity 08/27/2011  . Anxiety and depression 08/27/2011  . 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 albuterol (PROVENTIL HFA;VENTOLIN HFA) 108 (90 BASE) MCG/ACT inhaler Inhale 2 puffs into the lungs every 6 (six) hours as needed for wheezing or shortness of breath.  Marland Kitchen albuterol (PROVENTIL, VENTOLIN) (5 MG/ML) 0.5% NEBU Take by nebulization continuous.    Marland Kitchen amitriptyline (ELAVIL) 10 MG tablet Take 1 tablet by mouth at  bedtime  . amLODipine (NORVASC) 10 MG tablet Take 1 tablet by mouth  daily for blood pressure as directed  . aspirin EC 81 MG tablet Take 1 tablet (81 mg total) by  mouth daily.  . budesonide-formoterol (SYMBICORT) 160-4.5 MCG/ACT inhaler Inhale 2 puffs into the lungs 2 (two) times daily.  . Calcium 1200-1000 MG-UNIT CHEW Chew by mouth.    . Cholecalciferol (VITAMIN D-3) 5000 UNITS TABS Take by mouth daily.    . fenofibrate 160 MG tablet Take 1 tablet by mouth  daily  . fluticasone (FLONASE) 50 MCG/ACT nasal spray Place 2 sprays into both nostrils daily.  Marland Kitchen losartan-hydrochlorothiazide (HYZAAR) 100-25 MG per tablet Take 1 tablet by mouth  daily as directed  . metFORMIN (GLUCOPHAGE) 1000 MG tablet Take 1 tablet (1,000 mg total) by mouth 2 (two) times daily with a meal.  . montelukast (SINGULAIR) 10 MG tablet Take 1 tablet by mouth  daily  . omeprazole (PRILOSEC) 20 MG capsule Take 2 capsules (40 mg total) by mouth daily.  Marland Kitchen omeprazole (PRILOSEC) 40 MG capsule Take 1 capsule by mouth  daily  . ONE TOUCH ULTRA TEST test strip TEST BLOOD SUGAR 3 TIMES A DAY  . raloxifene (EVISTA) 60 MG tablet Take 1 tablet (60 mg total) by mouth daily.  Marland Kitchen venlafaxine (EFFEXOR) 75 MG tablet Take 1 tablet by mouth 3  times a day  . [DISCONTINUED] trimethoprim (TRIMPEX) 100 MG tablet Take 100 mg by mouth daily. For 30 days    Review of Systems  Constitutional: Negative.   HENT: Negative.   Eyes: Negative.   Respiratory: Negative.   Cardiovascular: Negative.   Gastrointestinal: Negative.   Endocrine: Negative.   Genitourinary: Negative.   Musculoskeletal: Negative.  Skin: Negative.   Allergic/Immunologic: Negative.   Neurological: Negative.   Hematological: Negative.   Psychiatric/Behavioral: Negative.        Objective:   Physical Exam  Constitutional: She is oriented to person, place, and time. She appears well-developed and well-nourished. No distress.  HENT:  Head: Normocephalic and atraumatic.  Right Ear: External ear normal.  Left Ear: External ear normal.  Nose: Nose normal.  Mouth/Throat: Oropharynx is clear and moist. No oropharyngeal exudate.  Eyes:  Conjunctivae and EOM are normal. Pupils are equal, round, and reactive to light. Right eye exhibits no discharge. Left eye exhibits no discharge. No scleral icterus.  Neck: Normal range of motion. Neck supple. No thyromegaly present.  No carotid bruits or thyromegaly  Cardiovascular: Normal rate, regular rhythm, normal heart sounds and intact distal pulses.   No murmur heard. The heart has a regular rate and rhythm at 72/m  Pulmonary/Chest: Effort normal and breath sounds normal. No respiratory distress. She has no wheezes. She has no rales. She exhibits no tenderness.  Abdominal: Soft. Bowel sounds are normal. She exhibits no mass. There is tenderness. There is no rebound and no guarding.  There is epigastric tenderness with palpation and without masses.  Musculoskeletal: Normal range of motion. She exhibits no edema or tenderness.  Lymphadenopathy:    She has no cervical adenopathy.  Neurological: She is alert and oriented to person, place, and time. She has normal reflexes. No cranial nerve deficit.  Skin: Skin is warm and dry. No rash noted.  Psychiatric: She has a normal mood and affect. Her behavior is normal. Judgment and thought content normal.  Nursing note and vitals reviewed.  BP 139/84 mmHg  Pulse 66  Temp(Src) 97.9 F (36.6 C) (Oral)  Ht 5' (1.524 m)  Wt 194 lb (87.998 kg)  BMI 37.89 kg/m2        Assessment & Plan:  1. Vitamin D deficiency - POCT CBC - Vit D  25 hydroxy (rtn osteoporosis monitoring)  2. Type 2 diabetes mellitus without complication - POCT CBC - POCT glycosylated hemoglobin (Hb A1C) - BMP8+EGFR  3. Essential hypertension - POCT CBC - BMP8+EGFR - Hepatic function panel  4. Hyperlipidemia - POCT CBC - NMR, lipoprofile  5. Gastroesophageal reflux disease, esophagitis presence not specified - POCT CBC - Hepatic function panel  6. Depression -Continue Effexor  No orders of the defined types were placed in this encounter.     Patient  Instructions  Continue current medications. Continue good therapeutic lifestyle changes which include good diet and exercise. Fall precautions discussed with patient. If an FOBT was given today- please return it to our front desk. If you are over 23 years old - you may need Prevnar 13 or the adult Pneumonia vaccine.  Flu Shots will be available at our office starting mid- September. Please call and schedule a FLU CLINIC APPOINTMENT.   Continue to drink plenty of fluids Continue current medication    Nyra Capes MD

## 2014-11-05 NOTE — Patient Instructions (Addendum)
Continue current medications. Continue good therapeutic lifestyle changes which include good diet and exercise. Fall precautions discussed with patient. If an FOBT was given today- please return it to our front desk. If you are over 64 years old - you may need Prevnar 55 or the adult Pneumonia vaccine.  Flu Shots will be available at our office starting mid- September. Please call and schedule a FLU CLINIC APPOINTMENT.   Continue to drink plenty of fluids Continue current medication

## 2014-11-06 LAB — HEPATIC FUNCTION PANEL
ALT: 26 IU/L (ref 0–32)
AST: 22 IU/L (ref 0–40)
Albumin: 4.4 g/dL (ref 3.6–4.8)
Alkaline Phosphatase: 48 IU/L (ref 39–117)
BILIRUBIN DIRECT: 0.09 mg/dL (ref 0.00–0.40)
Total Bilirubin: 0.3 mg/dL (ref 0.0–1.2)
Total Protein: 6.6 g/dL (ref 6.0–8.5)

## 2014-11-06 LAB — NMR, LIPOPROFILE
Cholesterol: 181 mg/dL (ref 100–199)
HDL CHOLESTEROL BY NMR: 34 mg/dL — AB (ref >39)
HDL Particle Number: 33.3 umol/L (ref >=30.5)
LDL PARTICLE NUMBER: 1838 nmol/L — AB (ref <1000)
LDL Size: 19.7 nm (ref >20.5)
LDL-C: 111 mg/dL — ABNORMAL HIGH (ref 0–99)
LP-IR Score: 80 — ABNORMAL HIGH (ref <=45)
Small LDL Particle Number: 1442 nmol/L — ABNORMAL HIGH (ref <=527)
TRIGLYCERIDES BY NMR: 179 mg/dL — AB (ref 0–149)

## 2014-11-06 LAB — BMP8+EGFR
BUN/Creatinine Ratio: 30 — ABNORMAL HIGH (ref 11–26)
BUN: 17 mg/dL (ref 8–27)
CALCIUM: 9.4 mg/dL (ref 8.7–10.3)
CO2: 22 mmol/L (ref 18–29)
CREATININE: 0.56 mg/dL — AB (ref 0.57–1.00)
Chloride: 101 mmol/L (ref 97–108)
GFR calc Af Amer: 114 mL/min/{1.73_m2} (ref >59)
GFR calc non Af Amer: 99 mL/min/{1.73_m2} (ref >59)
GLUCOSE: 102 mg/dL — AB (ref 65–99)
POTASSIUM: 4.1 mmol/L (ref 3.5–5.2)
Sodium: 142 mmol/L (ref 134–144)

## 2014-11-06 LAB — VITAMIN D 25 HYDROXY (VIT D DEFICIENCY, FRACTURES): Vit D, 25-Hydroxy: 51.6 ng/mL (ref 30.0–100.0)

## 2014-11-12 ENCOUNTER — Ambulatory Visit (HOSPITAL_COMMUNITY)
Admission: RE | Admit: 2014-11-12 | Discharge: 2014-11-12 | Disposition: A | Discharge status: Home / Self Care | Payer: 59 | Source: Ambulatory Visit | Attending: Family Medicine | Admitting: Family Medicine

## 2014-11-12 ENCOUNTER — Telehealth: Payer: Self-pay

## 2014-11-12 DIAGNOSIS — R109 Unspecified abdominal pain: Secondary | ICD-10-CM

## 2014-11-12 DIAGNOSIS — N200 Calculus of kidney: Secondary | ICD-10-CM | POA: Diagnosis present

## 2014-11-12 DIAGNOSIS — R160 Hepatomegaly, not elsewhere classified: Secondary | ICD-10-CM | POA: Insufficient documentation

## 2014-11-12 NOTE — Telephone Encounter (Signed)
Pt aware of results 

## 2014-11-12 NOTE — Telephone Encounter (Signed)
-----   Message from Chipper Herb, MD sent at 11/12/2014 11:24 AM EST ----- As per radiology report--- please let the patient be aware of the fatty liver. Continue to work on cholesterol and diet. The gallbladder the common bile duct and the liver were otherwise normal. Except for the fatty liver. The pancreas was normal. The spleen was normal. The aorta was normal. According to the radiologist there may be some small nonobstructing kidney stones.

## 2014-12-09 ENCOUNTER — Other Ambulatory Visit: Payer: Self-pay | Admitting: Family Medicine

## 2014-12-18 ENCOUNTER — Ambulatory Visit (INDEPENDENT_AMBULATORY_CARE_PROVIDER_SITE_OTHER): Payer: 59 | Admitting: Family Medicine

## 2014-12-18 ENCOUNTER — Ambulatory Visit (INDEPENDENT_AMBULATORY_CARE_PROVIDER_SITE_OTHER): Payer: 59

## 2014-12-18 ENCOUNTER — Encounter: Payer: Self-pay | Admitting: Family Medicine

## 2014-12-18 VITALS — BP 139/77 | HR 82 | Temp 96.8°F | Ht 60.0 in | Wt 192.8 lb

## 2014-12-18 DIAGNOSIS — M25512 Pain in left shoulder: Secondary | ICD-10-CM

## 2014-12-18 DIAGNOSIS — R52 Pain, unspecified: Secondary | ICD-10-CM

## 2014-12-18 NOTE — Progress Notes (Signed)
Subjective:    Patient ID: Melissa Bowman, female    DOB: [birth year], 64 y.o.   MRN: [id number]  HPI 64 year old female with left shoulder injury. She fell in her kitchen 2 days ago and landed on the tip of the shoulder. Since then she has some fairly restricted movement of the shoulder. She has a past history of rotator cuff injury on the opposite shoulder and says that this does not feel the same, although I explained there are several different ligamentous tenderness areas and injuries or not all equal to other injuries    Review of Systems  Constitutional: Negative.   HENT: Negative.   Eyes: Negative.   Respiratory: Negative.   Cardiovascular: Negative.   Gastrointestinal: Negative.   Endocrine: Negative.   Genitourinary: Negative.   Hematological: Negative.   Psychiatric/Behavioral: Negative.        Objective:   Physical Exam  Musculoskeletal:  Exam limited to left shoulder. There is normal contour. Abduction is severely limited and she cannot raise her arm above shoulder height. Testing supraspinatus tendon appears normal internal rotation is restricted. It is hard for her to localize area of pain but it seems to be more superior than anywhere else x-ray exam:   BP 139/77 mmHg  Pulse 82  Temp(Src) 96.8 F (36 C) (Oral)  Ht 5' (1.524 m)  Wt 192 lb 12.8 oz (87.454 kg)  BMI 37.65 kg/m2       Assessment & Plan:  1. Pain  - DG Shoulder Left; Future  2. Pain in joint, shoulder region, left X-ray shows degenerative changes in the before meals joint but is otherwise negative area given her degree of symptoms and lack of motion I would like to refer her on to see her orthopedist thinking that if she does have rotator cuff tear repair is more successful if done sooner than later.  Frederica Kuster MD

## 2015-01-24 ENCOUNTER — Ambulatory Visit (INDEPENDENT_AMBULATORY_CARE_PROVIDER_SITE_OTHER): Payer: 59 | Admitting: Urology

## 2015-01-24 DIAGNOSIS — N200 Calculus of kidney: Secondary | ICD-10-CM

## 2015-01-24 DIAGNOSIS — N952 Postmenopausal atrophic vaginitis: Secondary | ICD-10-CM

## 2015-01-24 DIAGNOSIS — N3941 Urge incontinence: Secondary | ICD-10-CM

## 2015-01-24 DIAGNOSIS — N3020 Other chronic cystitis without hematuria: Secondary | ICD-10-CM

## 2015-02-15 ENCOUNTER — Other Ambulatory Visit: Payer: Self-pay | Admitting: *Deleted

## 2015-02-15 MED ORDER — METFORMIN HCL 1000 MG PO TABS: 1000.0000 mg | ORAL_TABLET | Freq: Two times a day (BID) | ORAL | Status: DC

## 2015-02-23 ENCOUNTER — Other Ambulatory Visit: Payer: Self-pay | Admitting: Family Medicine

## 2015-03-11 ENCOUNTER — Other Ambulatory Visit: Payer: Self-pay | Admitting: Family Medicine

## 2015-03-12 ENCOUNTER — Other Ambulatory Visit: Payer: Self-pay | Admitting: *Deleted

## 2015-03-12 ENCOUNTER — Other Ambulatory Visit (INDEPENDENT_AMBULATORY_CARE_PROVIDER_SITE_OTHER): Payer: 59

## 2015-03-12 DIAGNOSIS — E119 Type 2 diabetes mellitus without complications: Secondary | ICD-10-CM

## 2015-03-12 DIAGNOSIS — M8580 Other specified disorders of bone density and structure, unspecified site: Secondary | ICD-10-CM

## 2015-03-12 DIAGNOSIS — E785 Hyperlipidemia, unspecified: Secondary | ICD-10-CM

## 2015-03-12 DIAGNOSIS — Z0000 Encounter for general adult medical examination without abnormal findings: Secondary | ICD-10-CM

## 2015-03-12 DIAGNOSIS — I10 Essential (primary) hypertension: Secondary | ICD-10-CM

## 2015-03-12 LAB — POCT UA - MICROSCOPIC ONLY
BACTERIA, U MICROSCOPIC: NEGATIVE
Casts, Ur, LPF, POC: NEGATIVE
Crystals, Ur, HPF, POC: NEGATIVE
Mucus, UA: NEGATIVE
RBC, URINE, MICROSCOPIC: NEGATIVE
WBC, Ur, HPF, POC: NEGATIVE
YEAST UA: NEGATIVE

## 2015-03-12 LAB — POCT CBC
GRANULOCYTE PERCENT: 45.7 % (ref 37–80)
HCT, POC: 42.6 % (ref 37.7–47.9)
HEMOGLOBIN: 13.3 g/dL (ref 12.2–16.2)
Lymph, poc: 3.3 (ref 0.6–3.4)
MCH, POC: 29.3 pg (ref 27–31.2)
MCHC: 31.3 g/dL — AB (ref 31.8–35.4)
MCV: 93.5 fL (ref 80–97)
MPV: 7 fL (ref 0–99.8)
POC Granulocyte: 3 (ref 2–6.9)
POC LYMPH PERCENT: 49.7 %L (ref 10–50)
Platelet Count, POC: 309 10*3/uL (ref 142–424)
RBC: 4.56 M/uL (ref 4.04–5.48)
RDW, POC: 13.1 %
WBC: 6.6 10*3/uL (ref 4.6–10.2)

## 2015-03-12 LAB — POCT URINALYSIS DIPSTICK
Bilirubin, UA: NEGATIVE
Blood, UA: NEGATIVE
GLUCOSE UA: NEGATIVE
KETONES UA: NEGATIVE
Leukocytes, UA: NEGATIVE
NITRITE UA: NEGATIVE
PH UA: 5
PROTEIN UA: NEGATIVE
SPEC GRAV UA: 1.01
UROBILINOGEN UA: NEGATIVE

## 2015-03-12 LAB — POCT GLYCOSYLATED HEMOGLOBIN (HGB A1C): Hemoglobin A1C: 5.1

## 2015-03-12 LAB — POCT UA - MICROALBUMIN: Microalbumin Ur, POC: 20 mg/L

## 2015-03-12 NOTE — Progress Notes (Signed)
Lab only 

## 2015-03-12 NOTE — Addendum Note (Signed)
Addended by: Earlene Plater on: 03/12/2015 02:56 PM   Modules accepted: Orders

## 2015-03-13 LAB — NMR, LIPOPROFILE
Cholesterol: 184 mg/dL (ref 100–199)
HDL Cholesterol by NMR: 33 mg/dL — ABNORMAL LOW (ref >39)
HDL Particle Number: 33.1 umol/L (ref >=30.5)
LDL PARTICLE NUMBER: 1768 nmol/L — AB (ref <1000)
LDL Size: 19.5 nm (ref >20.5)
LDL-C: 94 mg/dL (ref 0–99)
LP-IR SCORE: 75 — AB (ref <=45)
Small LDL Particle Number: 1514 nmol/L — ABNORMAL HIGH (ref <=527)
TRIGLYCERIDES BY NMR: 283 mg/dL — AB (ref 0–149)

## 2015-03-13 LAB — MICROALBUMIN, URINE: Microalbumin, Urine: 7.7 ug/mL (ref 0.0–17.0)

## 2015-03-13 LAB — HEPATIC FUNCTION PANEL
ALBUMIN: 4.6 g/dL (ref 3.6–4.8)
ALT: 39 IU/L — AB (ref 0–32)
AST: 27 IU/L (ref 0–40)
Alkaline Phosphatase: 47 IU/L (ref 39–117)
BILIRUBIN TOTAL: 0.3 mg/dL (ref 0.0–1.2)
Bilirubin, Direct: 0.08 mg/dL (ref 0.00–0.40)
Total Protein: 6.7 g/dL (ref 6.0–8.5)

## 2015-03-13 LAB — BMP8+EGFR
BUN/Creatinine Ratio: 31 — ABNORMAL HIGH (ref 11–26)
BUN: 18 mg/dL (ref 8–27)
CHLORIDE: 100 mmol/L (ref 97–108)
CO2: 24 mmol/L (ref 18–29)
Calcium: 9.6 mg/dL (ref 8.7–10.3)
Creatinine, Ser: 0.58 mg/dL (ref 0.57–1.00)
GFR calc non Af Amer: 98 mL/min/{1.73_m2} (ref >59)
GFR, EST AFRICAN AMERICAN: 113 mL/min/{1.73_m2} (ref >59)
GLUCOSE: 109 mg/dL — AB (ref 65–99)
Potassium: 4.3 mmol/L (ref 3.5–5.2)
Sodium: 142 mmol/L (ref 134–144)

## 2015-03-13 LAB — VITAMIN D 25 HYDROXY (VIT D DEFICIENCY, FRACTURES): Vit D, 25-Hydroxy: 42.5 ng/mL (ref 30.0–100.0)

## 2015-03-16 ENCOUNTER — Other Ambulatory Visit: Payer: Self-pay | Admitting: Nurse Practitioner

## 2015-03-16 ENCOUNTER — Other Ambulatory Visit: Payer: Self-pay | Admitting: Family Medicine

## 2015-03-18 ENCOUNTER — Ambulatory Visit (INDEPENDENT_AMBULATORY_CARE_PROVIDER_SITE_OTHER): Payer: 59 | Admitting: Family Medicine

## 2015-03-18 ENCOUNTER — Encounter: Payer: Self-pay | Admitting: Family Medicine

## 2015-03-18 VITALS — BP 138/90 | HR 79 | Temp 97.8°F | Ht 60.0 in | Wt 194.0 lb

## 2015-03-18 DIAGNOSIS — E119 Type 2 diabetes mellitus without complications: Secondary | ICD-10-CM | POA: Diagnosis not present

## 2015-03-18 DIAGNOSIS — J301 Allergic rhinitis due to pollen: Secondary | ICD-10-CM

## 2015-03-18 DIAGNOSIS — E559 Vitamin D deficiency, unspecified: Secondary | ICD-10-CM

## 2015-03-18 DIAGNOSIS — I10 Essential (primary) hypertension: Secondary | ICD-10-CM | POA: Diagnosis not present

## 2015-03-18 DIAGNOSIS — E785 Hyperlipidemia, unspecified: Secondary | ICD-10-CM

## 2015-03-18 DIAGNOSIS — K219 Gastro-esophageal reflux disease without esophagitis: Secondary | ICD-10-CM | POA: Diagnosis not present

## 2015-03-18 NOTE — Patient Instructions (Addendum)
Continue current medications. Continue good therapeutic lifestyle changes which include good diet and exercise. Fall precautions discussed with patient. If an FOBT was given today- please return it to our front desk. If you are over 65 years old - you may need Prevnar 64 or the adult Pneumonia vaccine.  Flu Shots are still available at our office. If you still haven't had one please call to set up a nurse visit to get one.   After your visit with [place] today you will receive a survey in the mail or online from [employer] regarding your care with [place]. Please take a moment to fill this out. Your feedback is very important to [place] as you can help [place] better understand your patient needs as well as improve your experience and satisfaction. WE CARE ABOUT YOU!!!  The patient should continue with her Claritin, Flonase, and Singulair. Using nasal saline will also be helpful. Her hemoglobin A1c was excellent but with increased activity her blood sugar may drop too low and she is instructed to check her blood sugars after activity to make sure that numbers do not drop too low. If she feels weak especially after activity she should check a blood sugar. She should check with her insurance regarding the Prevnar vaccine Continue to monitor blood sugars and blood pressures at home and bring these readings to the office with each visit.

## 2015-03-18 NOTE — Progress Notes (Signed)
Subjective:    Patient ID: Melissa Bowman, female    DOB: Aug 04, 1951, 64 y.o.   MRN: [id number]  HPI Pt here for follow up and management of chronic medical problems which includes hypertension, hyperlipidemia, and diabetes. She is taking medications regularly. The patient's main complaint today is sinus pressure and seasonal allergies. The patient's most recent lab work will be reviewed with her. The total LDL particle number remains elevated. The patient is intolerant to statins. She will be referred to our clinical pharmacists t to consider her for one of the injectable statin lowering drugs. Her blood sugar has been under good control for the past 3 months with a hemoglobin A1c of 5.1% and a low urine microalbumin. All liver function tests were normal. The CBC was also within normal limits. All of this was discussed with the patient during the visit.        Patient Active Problem List   Diagnosis Date Noted  . Osteopenia 12/26/2013  . Type 2 diabetes mellitus 10/31/2013  . Hypertension 05/14/2013  . Hyperlipidemia 05/14/2013  . Depression 05/14/2013  . Allergic rhinitis 05/14/2013  . Vitamin D deficiency 05/14/2013  . Hemorrhage of rectum and anus 09/15/2011  . Dysphagia, pharyngoesophageal phase 09/15/2011  . NAFLD (nonalcoholic fatty liver disease) 96/29/5284  . Chronic RUQ pain 09/15/2011  . Diverticulosis of colon (without mention of hemorrhage) 09/15/2011  . BRBPR (bright red blood per rectum) 08/27/2011  . Internal hemorrhoids without mention of complication 08/27/2011  . Esophageal reflux 08/27/2011  . Abdominal pain 08/27/2011  . Esophageal dysphagia 08/27/2011  . Obesity 08/27/2011  . Anxiety and depression 08/27/2011  . Central sleep apnea due to medical condition 08/27/2011   Outpatient Encounter Prescriptions as of 03/18/2015  Medication Sig  . albuterol (PROVENTIL HFA;VENTOLIN HFA) 108 (90 BASE) MCG/ACT inhaler Inhale 2 puffs into the lungs every 6 (six) hours  as needed for wheezing or shortness of breath.  Marland Kitchen albuterol (PROVENTIL, VENTOLIN) (5 MG/ML) 0.5% NEBU Take by nebulization continuous.    Marland Kitchen amitriptyline (ELAVIL) 10 MG tablet Take 1 tablet by mouth at  bedtime  . amLODipine (NORVASC) 10 MG tablet Take 1 tablet by mouth  daily for blood pressure as directed  . aspirin EC 81 MG tablet Take 1 tablet (81 mg total) by mouth daily.  . budesonide-formoterol (SYMBICORT) 160-4.5 MCG/ACT inhaler Inhale 2 puffs into the lungs 2 (two) times daily.  . Calcium 1200-1000 MG-UNIT CHEW Chew by mouth.    . Cholecalciferol (VITAMIN D-3) 5000 UNITS TABS Take by mouth daily.    . fenofibrate 160 MG tablet Take 1 tablet by mouth  daily  . fluticasone (FLONASE) 50 MCG/ACT nasal spray Place 2 sprays into both nostrils daily.  Marland Kitchen losartan-hydrochlorothiazide (HYZAAR) 100-25 MG per tablet Take 1 tablet by mouth  daily as directed  . metFORMIN (GLUCOPHAGE) 1000 MG tablet TAKE 1 TABLET (1,000 MG TOTAL) BY MOUTH 2 (TWO) TIMES DAILY WITH A MEAL.  . montelukast (SINGULAIR) 10 MG tablet Take 1 tablet by mouth  daily  . omeprazole (PRILOSEC) 40 MG capsule Take 1 capsule by mouth  daily  . ONE TOUCH ULTRA TEST test strip TEST BLOOD SUGAR 3 TIMES A DAY  . PREMARIN vaginal cream   . raloxifene (EVISTA) 60 MG tablet Take 1 tablet by mouth  daily  . venlafaxine (EFFEXOR) 75 MG tablet Take 1 tablet by mouth 3  times a day    Review of Systems  Constitutional: Negative.   HENT: Positive for sinus  pressure.        Seasonal allergies  Eyes: Negative.   Respiratory: Negative.   Cardiovascular: Negative.   Gastrointestinal: Negative.   Endocrine: Negative.   Genitourinary: Negative.   Musculoskeletal: Negative.   Skin: Negative.   Allergic/Immunologic: Negative.   Neurological: Negative.   Hematological: Negative.   Psychiatric/Behavioral: Negative.        Objective:   Physical Exam  Constitutional: She is oriented to person, place, and time. She appears well-developed  and well-nourished. No distress.  HENT:  Head: Normocephalic and atraumatic.  Right Ear: External ear normal.  Left Ear: External ear normal.  Mouth/Throat: Oropharynx is clear and moist. No oropharyngeal exudate.  Nasal congestion bilaterally  Eyes: Conjunctivae and EOM are normal. Pupils are equal, round, and reactive to light. Right eye exhibits no discharge. Left eye exhibits no discharge. No scleral icterus.  Neck: Normal range of motion. Neck supple. No thyromegaly present.  No carotid bruits or thyromegaly or adenopathy  Cardiovascular: Normal rate, regular rhythm, normal heart sounds and intact distal pulses.  Exam reveals no gallop and no friction rub.   No murmur heard. At 72/m  Pulmonary/Chest: Effort normal and breath sounds normal. No respiratory distress. She has no wheezes. She has no rales. She exhibits no tenderness.  Abdominal: Soft. Bowel sounds are normal. She exhibits no mass. There is tenderness. There is no rebound and no guarding.  Slight epigastric tenderness. Patient also has abdominal obesity  Musculoskeletal: Normal range of motion. She exhibits no edema or tenderness.  Lymphadenopathy:    She has no cervical adenopathy.  Neurological: She is alert and oriented to person, place, and time. She has normal reflexes. No cranial nerve deficit.  Skin: Skin is warm and dry. No rash noted.  Psychiatric: She has a normal mood and affect. Her behavior is normal. Judgment and thought content normal.  Nursing note and vitals reviewed.  BP 145/92 mmHg  Pulse 79  Temp(Src) 97.8 F (36.6 C) (Oral)  Ht 5' (1.524 m)  Wt 194 lb (87.998 kg)  BMI 37.89 kg/m2        Assessment & Plan:  1. Vitamin D deficiency -The patient should continue with her current treatment based on lab work reviewed with her today  2. Type 2 diabetes mellitus without complication -The patient has excellent control of her blood sugar with a hemoglobin A1c at 5.1%  3. Essential  hypertension -The patient's blood pressure is also under good control and she should continue with current treatment  4. Hyperlipidemia -The patient is statin intolerant and we will put her on the list to talk to the clinical pharmacist regarding injectable statin control medication  5. Gastroesophageal reflux disease, esophagitis presence not specified -Currently this is under good control with no complaints  6. Allergic rhinitis due to pollen -The patient was awakened starting her medication this year and does have a lot of congestion. She should continue with her Flonase, Singulair, and Claritin as well as nasal saline.  No orders of the defined types were placed in this encounter.   Patient Instructions  Continue current medications. Continue good therapeutic lifestyle changes which include good diet and exercise. Fall precautions discussed with patient. If an FOBT was given today- please return it to our front desk. If you are over 86 years old - you may need Prevnar 13 or the adult Pneumonia vaccine.  Flu Shots are still available at our office. If you still haven't had one please call to set up a nurse  visit to get one.   After your visit with [place] today you will receive a survey in the mail or online from [employer] regarding your care with [place]. Please take a moment to fill this out. Your feedback is very important to [place] as you can help [place] better understand your patient needs as well as improve your experience and satisfaction. WE CARE ABOUT YOU!!!  The patient should continue with her Claritin, Flonase, and Singulair. Using nasal saline will also be helpful. Her hemoglobin A1c was excellent but with increased activity her blood sugar may drop too low and she is instructed to check her blood sugars after activity to make sure that numbers do not drop too low. If she feels weak especially after activity she should check a blood sugar. She should check with her insurance regarding the  Prevnar vaccine Continue to monitor blood sugars and blood pressures at home and bring these readings to the office with each visit.   Nyra Capes MD

## 2015-03-19 ENCOUNTER — Other Ambulatory Visit: Payer: Self-pay | Admitting: Nurse Practitioner

## 2015-03-25 ENCOUNTER — Encounter: Payer: Self-pay | Admitting: Pharmacist Clinician (PhC)/ Clinical Pharmacy Specialist

## 2015-03-25 ENCOUNTER — Ambulatory Visit (INDEPENDENT_AMBULATORY_CARE_PROVIDER_SITE_OTHER): Payer: 59 | Admitting: Pharmacist Clinician (PhC)/ Clinical Pharmacy Specialist

## 2015-03-25 DIAGNOSIS — E785 Hyperlipidemia, unspecified: Secondary | ICD-10-CM | POA: Diagnosis not present

## 2015-03-25 NOTE — Progress Notes (Signed)
Patient is referred by Dr. Laurance Flatten for evaluation for PCSK9 inhibitor therapy due to history of severe statin intolerance.  Unfortunately, patient does not qualify for this medication.  She does not have clinical ASCVD, CAD, premature family history of CAD, or homozygous/heterozygous hyperlipidemia.  Patient has type 2 diabetes and this explains some of her TG elevations.  Her LDL-C is excellent at 94mg /dL but very discordant with her LDL0P of over 1700.  I discussed diet with patient today and also the option of trying Zetia, since she has never taken it.  I also had thyroid panel drawn today to make sure this is not a secondary cause of dylipidemia since it was last check two years ago and she has a family history of thyroid disorders.  Patient will be contacted tomorrow with results and plan to start Zetia with samples.

## 2015-05-03 ENCOUNTER — Other Ambulatory Visit: Payer: Self-pay | Admitting: Family Medicine

## 2015-06-02 ENCOUNTER — Other Ambulatory Visit: Payer: Self-pay | Admitting: *Deleted

## 2015-06-02 MED ORDER — METFORMIN HCL 1000 MG PO TABS: ORAL_TABLET | ORAL | Status: DC

## 2015-06-21 ENCOUNTER — Other Ambulatory Visit: Payer: Self-pay | Admitting: Nurse Practitioner

## 2015-06-21 ENCOUNTER — Encounter: Payer: Self-pay | Admitting: *Deleted

## 2015-06-23 ENCOUNTER — Encounter: Payer: Self-pay | Admitting: Family Medicine

## 2015-06-23 ENCOUNTER — Ambulatory Visit (INDEPENDENT_AMBULATORY_CARE_PROVIDER_SITE_OTHER): Payer: 59

## 2015-06-23 ENCOUNTER — Ambulatory Visit (INDEPENDENT_AMBULATORY_CARE_PROVIDER_SITE_OTHER): Payer: 59 | Admitting: Family Medicine

## 2015-06-23 VITALS — BP 131/78 | HR 87 | Temp 97.9°F | Ht 60.0 in | Wt 195.0 lb

## 2015-06-23 DIAGNOSIS — R062 Wheezing: Secondary | ICD-10-CM

## 2015-06-23 DIAGNOSIS — R05 Cough: Secondary | ICD-10-CM

## 2015-06-23 DIAGNOSIS — J209 Acute bronchitis, unspecified: Secondary | ICD-10-CM

## 2015-06-23 DIAGNOSIS — R058 Other specified cough: Secondary | ICD-10-CM

## 2015-06-23 LAB — POCT CBC
Granulocyte percent: 43.6 %G (ref 37–80)
HCT, POC: 42.2 % (ref 37.7–47.9)
Hemoglobin: 13.8 g/dL (ref 12.2–16.2)
Lymph, poc: 3.4 (ref 0.6–3.4)
MCH, POC: 29.4 pg (ref 27–31.2)
MCHC: 32.7 g/dL (ref 31.8–35.4)
MCV: 89.9 fL (ref 80–97)
MPV: 7.4 fL (ref 0–99.8)
POC GRANULOCYTE: 3 (ref 2–6.9)
POC LYMPH PERCENT: 49 %L (ref 10–50)
Platelet Count, POC: 324 10*3/uL (ref 142–424)
RBC: 4.69 M/uL (ref 4.04–5.48)
RDW, POC: 13.6 %
WBC: 6.9 10*3/uL (ref 4.6–10.2)

## 2015-06-23 MED ORDER — PREDNISONE 10 MG PO TABS: ORAL_TABLET | ORAL | Status: DC

## 2015-06-23 MED ORDER — METHYLPREDNISOLONE ACETATE 80 MG/ML IJ SUSP
60.0000 mg | Freq: Once | INTRAMUSCULAR | Status: AC
Start: 2015-06-23 — End: 2015-06-23
  Administered 2015-06-23: 60 mg via INTRAMUSCULAR

## 2015-06-23 MED ORDER — BUDESONIDE 0.25 MG/2ML IN SUSP: 0.2500 mg | Freq: Two times a day (BID) | RESPIRATORY_TRACT | Status: DC

## 2015-06-23 MED ORDER — AZITHROMYCIN 250 MG PO TABS: ORAL_TABLET | ORAL | Status: DC

## 2015-06-23 NOTE — Progress Notes (Signed)
Subjective:    Patient ID: Melissa Bowman, female    DOB: June 16, 1951, 64 y.o.   MRN: [id number]  HPI  64 year old female with one week history of productive cough and fever. Also complains of a headache. She has taken Mucinex and uses Symbicort twice daily. The patient has been coughing up green sputum. This is been going on for about 1 week. She has also has some fatigue and fever. She has a history of allergies and headache.  Review of Systems  Constitutional: Positive for fever and fatigue.  Eyes: Negative.   Respiratory: Positive for cough (productive), chest tightness and wheezing.   Cardiovascular: Negative.   Gastrointestinal: Negative.   Endocrine: Negative.   Genitourinary: Negative.   Musculoskeletal: Negative.   Skin: Negative.   Allergic/Immunologic: Positive for environmental allergies.  Neurological: Positive for headaches.  Hematological: Negative.   Psychiatric/Behavioral: Negative.        Objective:   Physical Exam  Constitutional: She is oriented to person, place, and time. She appears well-developed and well-nourished. She appears distressed.  The patient is alert and somewhat stressed with her cough and congestion that she is having  HENT:  Head: Normocephalic and atraumatic.  Right Ear: External ear normal.  Left Ear: External ear normal.  Nose: Nose normal.  Mouth/Throat: Oropharynx is clear and moist.  Eyes: Conjunctivae and EOM are normal. Pupils are equal, round, and reactive to light. Right eye exhibits no discharge. Left eye exhibits no discharge. No scleral icterus.  Neck: Normal range of motion. Neck supple. No thyromegaly present.  Cardiovascular: Normal rate, regular rhythm and normal heart sounds.   No murmur heard. Pulmonary/Chest: Effort normal. No respiratory distress. She has wheezes. She has no rales. She exhibits no tenderness.  Wheezes and rhonchi bilaterally  Abdominal: She exhibits no mass.  Musculoskeletal: Normal range of  motion. She exhibits no edema.  Lymphadenopathy:    She has no cervical adenopathy.  Neurological: She is alert and oriented to person, place, and time.  Skin: Skin is warm. No rash noted. No erythema. No pallor.  Clammy and sweaty  Psychiatric: She has a normal mood and affect. Her behavior is normal. Judgment and thought content normal.  Nursing note and vitals reviewed.  BP 131/78 mmHg  Pulse 87  Temp(Src) 97.9 F (36.6 C) (Oral)  Ht 5' (1.524 m)  Wt 195 lb (88.451 kg)  BMI 38.08 kg/m2 Results for orders placed or performed in visit on 06/23/15  POCT CBC  Result Value Ref Range   WBC 6.9 4.6 - 10.2 K/uL   Lymph, poc 3.4 0.6 - 3.4   POC LYMPH PERCENT 49.0 10 - 50 %L   POC Granulocyte 3.0 2 - 6.9   Granulocyte percent 43.6 37 - 80 %G   RBC 4.69 4.04 - 5.48 M/uL   Hemoglobin 13.8 12.2 - 16.2 g/dL   HCT, POC 91.4 78.2 - 47.9 %   MCV 89.9 80 - 97 fL   MCH, POC 29.4 27 - 31.2 pg   MCHC 32.7 31.8 - 35.4 g/dL   RDW, POC 95.6 %   Platelet Count, POC 324 142 - 424 K/uL   MPV 7.4 0 - 99.8 fL   [facility] reading (PRIMARY) by  Dr. Tracie Harrier x-ray-no active disease   The CBC and chest x-ray results were reviewed with the patient while she was in the office  The patient was given a nebulizer treatment in the office using DuoNeb----this helped her breathing. She will be given a prescription for Pulmicort and albuterol to be using a nebulizer treatment at home until she starts feeling better and then she will switch over to Symbicort and albuterol inhalers       Assessment & Plan:  1. Productive cough -Take Mucinex maximum strength, blue and white in color, 1 twice daily with a large glass of water -Take Tylenol for aches pains and fever - POCT CBC - DG Chest 2 View; Future - azithromycin (ZITHROMAX) 250 MG tablet; 2 pills the first day then one daily for infection until completed  Dispense: 6 tablet; Refill: 0  2. Bronchitis, acute, with  bronchospasm -Take antibiotic as directed and cough medicine as directed - POCT CBC - DG Chest 2 View; Future - predniSONE (DELTASONE) 10 MG tablet; 1 tablet 4 times a day for 2 days,  1 tablet 3 times a day for 2 days,  1 tablet 2 times a day for 2 days, 1 tablet daily for 2 days  Dispense: 20 tablet; Refill: 0 - methylPREDNISolone acetate (DEPO-MEDROL) injection 60 mg; Inject 0.75 mLs (60 mg total) into the muscle once. - azithromycin (ZITHROMAX) 250 MG tablet; 2 pills the first day then one daily for infection until completed  Dispense: 6 tablet; Refill: 0  3. Wheezing -Drink plenty of fluids take prednisone as directed and monitor blood sugar closely - predniSONE (DELTASONE) 10 MG tablet; 1 tablet 4 times a day for 2 days,  1 tablet 3 times a day for 2 days,  1 tablet 2 times a day for 2 days, 1 tablet daily for 2 days  Dispense: 20 tablet; Refill: 0 - methylPREDNISolone acetate (DEPO-MEDROL) injection 60 mg; Inject 0.75 mLs (60 mg total) into the muscle once.  Patient Instructions  Take Mucinex, maximum strength, blue and white in color, 1 twice daily with a large glass of water for cough and congestion Use nasal saline frequently through the day Drink plenty of fluids Take antibiotic as directed Take prednisone as directed and remember that prednisone may make your blood sugar go up higher side keep this checked more closely   Nyra Capes MD

## 2015-06-23 NOTE — Patient Instructions (Addendum)
Take Mucinex, maximum strength, blue and white in color, 1 twice daily with a large glass of water for cough and congestion Use nasal saline frequently through the day Drink plenty of fluids Take antibiotic as directed Take prednisone as directed and remember that prednisone may make your blood sugar go up higher side keep this checked more closely

## 2015-06-23 NOTE — Addendum Note (Signed)
Addended by: Jamelle Haring on: 06/23/2015 03:13 PM   Modules accepted: Orders

## 2015-07-06 ENCOUNTER — Other Ambulatory Visit: Payer: Self-pay | Admitting: Family Medicine

## 2015-07-28 ENCOUNTER — Encounter: Payer: Self-pay | Admitting: Family Medicine

## 2015-08-04 ENCOUNTER — Ambulatory Visit: Payer: 59 | Admitting: Family Medicine

## 2015-08-08 ENCOUNTER — Encounter: Payer: Self-pay | Admitting: Family Medicine

## 2015-08-08 ENCOUNTER — Ambulatory Visit (INDEPENDENT_AMBULATORY_CARE_PROVIDER_SITE_OTHER): Payer: 59 | Admitting: Family Medicine

## 2015-08-08 VITALS — BP 142/90 | HR 87 | Temp 97.2°F | Ht 60.0 in | Wt 196.0 lb

## 2015-08-08 DIAGNOSIS — I10 Essential (primary) hypertension: Secondary | ICD-10-CM

## 2015-08-08 DIAGNOSIS — K219 Gastro-esophageal reflux disease without esophagitis: Secondary | ICD-10-CM

## 2015-08-08 DIAGNOSIS — E559 Vitamin D deficiency, unspecified: Secondary | ICD-10-CM

## 2015-08-08 DIAGNOSIS — E785 Hyperlipidemia, unspecified: Secondary | ICD-10-CM

## 2015-08-08 DIAGNOSIS — E119 Type 2 diabetes mellitus without complications: Secondary | ICD-10-CM

## 2015-08-08 DIAGNOSIS — E669 Obesity, unspecified: Secondary | ICD-10-CM

## 2015-08-08 LAB — POCT GLYCOSYLATED HEMOGLOBIN (HGB A1C): Hemoglobin A1C: 5.8

## 2015-08-08 MED ORDER — FENOFIBRIC ACID 105 MG PO TABS: 1.0000 | ORAL_TABLET | Freq: Every day | ORAL | Status: DC

## 2015-08-08 NOTE — Patient Instructions (Addendum)
Medicare Annual Wellness Visit  [facility] and the medical providers at [facility] strive to bring you the best medical care.  In doing so we not only want to address your current medical conditions and concerns but also to detect new conditions early and prevent illness, disease and health-related problems.    Medicare offers a yearly Wellness Visit which allows our clinical staff to assess your need for preventative services including immunizations, lifestyle education, counseling to decrease risk of preventable diseases and screening for fall risk and other medical concerns.    This visit is provided free of charge (no copay) for all Medicare recipients. The clinical pharmacists at [facility] have begun to conduct these Wellness Visits which will also include a thorough review of all your medications.    As you primary medical provider recommend that you make an appointment for your Annual Wellness Visit if you have not done so already this year.  You may set up this appointment before you leave today or you may call back ([phone]) and schedule an appointment.  Please make sure when you call that you mention that you are scheduling your Annual Wellness Visit with the clinical pharmacist so that the appointment may be made for the proper length of time.     Continue current medications. Continue good therapeutic lifestyle changes which include good diet and exercise. Fall precautions discussed with patient. If an FOBT was given today- please return it to our front desk. If you are over 3 years old - you may need Prevnar 92 or the adult Pneumonia vaccine.  **Flu shots will be available soon--- please call and schedule a FLU-CLINIC appointment**  After your visit with [place] today you will receive a survey in the mail or online from [employer] regarding your care with [place]. Please take a moment to fill this out. Your feedback is  very important to [place] as you can help [place] better understand your patient needs as well as improve your experience and satisfaction. WE CARE ABOUT YOU!!!   **Please join [place] SEPT.22, 2016 from 5:00 to 7:00pm for our OPEN HOUSE! Come out and meet our NEW providers** Be happy to give you a referral for gastric sleeve bypass surgery if that is needed. Please continue to monitor blood sugars and blood pressures at home Bring blood pressure readings by for review and 3-4 weeks Watch sodium intake

## 2015-08-08 NOTE — Progress Notes (Signed)
Subjective:    Patient ID: Melissa Bowman, female    DOB: 11-30-1951, 64 y.o.   MRN: [id number]  HPI Pt here for follow up and management of chronic medical problems which includes hypertension, hyperlipidemia, and diabetes. She is taking medications regularly. The patient comes to the visit today with no specific complaints. She brings in blood sugars for review and all of these are good and I will be scanned into the record. She is due to return an FOBT and will get lab work done today. She will also check with her insurance about the Prevnar vaccine. The patient is in good spirits. She is having questions about the possibility of gastric sleeve bypass surgery and we will be glad to make a referral for this if she desires this. The patient denies chest pain shortness of breath trouble swallowing heartburn indigestion nausea vomiting diarrhea or blood in the stool. She is passing her water without problems.      Patient Active Problem List   Diagnosis Date Noted  . Osteopenia 12/26/2013  . Type 2 diabetes mellitus 10/31/2013  . Hypertension 05/14/2013  . Hyperlipidemia 05/14/2013  . Depression 05/14/2013  . Allergic rhinitis 05/14/2013  . Vitamin D deficiency 05/14/2013  . Hemorrhage of rectum and anus 09/15/2011  . Dysphagia, pharyngoesophageal phase 09/15/2011  . NAFLD (nonalcoholic fatty liver disease) 86/57/8469  . Chronic RUQ pain 09/15/2011  . Diverticulosis of colon (without mention of hemorrhage) 09/15/2011  . BRBPR (bright red blood per rectum) 08/27/2011  . Internal hemorrhoids without mention of complication 08/27/2011  . Esophageal reflux 08/27/2011  . Abdominal pain 08/27/2011  . Esophageal dysphagia 08/27/2011  . Obesity 08/27/2011  . Anxiety and depression 08/27/2011  . 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 albuterol (PROVENTIL HFA;VENTOLIN HFA) 108 (90 BASE) MCG/ACT  inhaler Inhale 2 puffs into the lungs every 6 (six) hours as needed for wheezing or shortness of breath.  Marland Kitchen albuterol (PROVENTIL, VENTOLIN) (5 MG/ML) 0.5% NEBU Take by nebulization continuous.    Marland Kitchen amitriptyline (ELAVIL) 10 MG tablet Take 1 tablet by mouth at  bedtime  . amLODipine (NORVASC) 10 MG tablet Take 1 tablet by mouth  daily for blood pressure as directed  . aspirin EC 81 MG tablet Take 1 tablet (81 mg total) by mouth daily.  . budesonide (PULMICORT) 0.25 MG/2ML nebulizer solution Take 2 mLs (0.25 mg total) by nebulization 2 (two) times daily.  . budesonide-formoterol (SYMBICORT) 160-4.5 MCG/ACT inhaler Inhale 2 puffs into the lungs 2 (two) times daily.  . Calcium 1200-1000 MG-UNIT CHEW Chew by mouth.    . Cholecalciferol (VITAMIN D-3) 5000 UNITS TABS Take by mouth daily.    . fenofibrate 160 MG tablet Take 1 tablet by mouth  daily  . fluticasone (FLONASE) 50 MCG/ACT nasal spray Place 2 sprays into both nostrils daily.  Marland Kitchen losartan-hydrochlorothiazide (HYZAAR) 100-25 MG per tablet Take 1 tablet by mouth  daily as directed  . metFORMIN (GLUCOPHAGE) 1000 MG tablet Take 1 tablet (1,000 mg  total) by mouth 2 times  daily with a meal.  . montelukast (SINGULAIR) 10 MG tablet Take 1 tablet by mouth  daily  . omeprazole (PRILOSEC) 40 MG capsule Take 1 capsule by mouth  daily  . ONE TOUCH ULTRA TEST test strip TEST BLOOD SUGAR 3 TIMES A DAY  . raloxifene (EVISTA) 60 MG tablet Take 1 tablet by mouth  daily  . venlafaxine (EFFEXOR) 75  MG tablet Take 1 tablet by mouth 3  times a day  . [DISCONTINUED] azithromycin (ZITHROMAX) 250 MG tablet 2 pills the first day then one daily for infection until completed  . [DISCONTINUED] predniSONE (DELTASONE) 10 MG tablet 1 tablet 4 times a day for 2 days, 1 tablet 3 times a day for 2 days, 1 tablet 2 times a day for 2 days,1 tablet daily for 2 days   No facility-administered encounter medications on file as of 08/08/2015.      Review of Systems    Constitutional: Negative.   HENT: Negative.   Eyes: Negative.   Respiratory: Negative.   Cardiovascular: Negative.   Gastrointestinal: Negative.   Endocrine: Negative.   Genitourinary: Negative.   Musculoskeletal: Negative.   Skin: Negative.   Allergic/Immunologic: Negative.   Neurological: Negative.   Hematological: Negative.   Psychiatric/Behavioral: Negative.        Objective:   Physical Exam  Constitutional: She is oriented to person, place, and time. She appears well-developed and well-nourished. No distress.  HENT:  Head: Normocephalic and atraumatic.  Right Ear: External ear normal.  Left Ear: External ear normal.  Nose: Nose normal.  Mouth/Throat: Oropharynx is clear and moist. No oropharyngeal exudate.  Eyes: Conjunctivae and EOM are normal. Pupils are equal, round, and reactive to light. Right eye exhibits no discharge. Left eye exhibits no discharge. No scleral icterus.  Neck: Normal range of motion. Neck supple. No thyromegaly present.  No carotid bruits  Cardiovascular: Normal rate, regular rhythm and intact distal pulses.   Murmur heard. Grade 1/6 systolic ejection murmur At 72/m  Pulmonary/Chest: Effort normal and breath sounds normal. No respiratory distress. She has no wheezes. She has no rales. She exhibits no tenderness.  Abdominal: Soft. Bowel sounds are normal. She exhibits no mass. There is no tenderness. There is no rebound and no guarding.  Abdominal obesity with out bruits or organ enlargement or tenderness  Musculoskeletal: Normal range of motion. She exhibits no edema or tenderness.  Lymphadenopathy:    She has no cervical adenopathy.  Neurological: She is alert and oriented to person, place, and time. She has normal reflexes. No cranial nerve deficit.  Skin: Skin is warm and dry. No rash noted.  Psychiatric: She has a normal mood and affect. Her behavior is normal. Judgment and thought content normal.  Nursing note and vitals reviewed.  BP  142/90 mmHg  Pulse 87  Temp(Src) 97.2 F (36.2 C) (Oral)  Ht 5' (1.524 m)  Wt 196 lb (88.905 kg)  BMI 38.28 kg/m2 Repeat blood pressure 140/88 left arm large cuff sitting       Assessment & Plan:  1. Hyperlipidemia -Continue current treatment pending results of lab work will switch patient to a cheaper fenofibrate - CBC with Differential/Platelet - NMR, lipoprofile  2. Vitamin D deficiency -Continue vitamin D replacement pending results of lab work - CBC with Differential/Platelet - Vit D  25 hydroxy (rtn osteoporosis monitoring)  3. Type 2 diabetes mellitus without complication -Blood sugars appear to be under very good control and no change in treatment is recommended unless A1c is higher than expected - POCT glycosylated hemoglobin (Hb A1C) - CBC with Differential/Platelet  4. Essential hypertension -Blood pressure is slightly elevated today in the office but she says the readings at home with a running in the 120s over the 70s. No change in treatment - BMP8+EGFR - CBC with Differential/Platelet - Hepatic function panel  5. Gastroesophageal reflux disease, esophagitis presence not specified -She is  not having any problems with her reflux currently - CBC with Differential/Platelet - Hepatic function panel  6. Obesity -She is having thoughts about pursuing some kind of gastric sleeve surgery to help with her weight loss and her diabetes and other health issues.  Patient Instructions                       Medicare Annual Wellness Visit  [place] and the medical providers at [facility] strive to bring you the best medical care.  In doing so we not only want to address your current medical conditions and concerns but also to detect new conditions early and prevent illness, disease and health-related problems.    Medicare offers a yearly Wellness Visit which allows our clinical staff to assess your need for preventative services including  immunizations, lifestyle education, counseling to decrease risk of preventable diseases and screening for fall risk and other medical concerns.    This visit is provided free of charge (no copay) for all Medicare recipients. The clinical pharmacists at [facility] have begun to conduct these Wellness Visits which will also include a thorough review of all your medications.    As you primary medical provider recommend that you make an appointment for your Annual Wellness Visit if you have not done so already this year.  You may set up this appointment before you leave today or you may call back ([phone]) and schedule an appointment.  Please make sure when you call that you mention that you are scheduling your Annual Wellness Visit with the clinical pharmacist so that the appointment may be made for the proper length of time.     Continue current medications. Continue good therapeutic lifestyle changes which include good diet and exercise. Fall precautions discussed with patient. If an FOBT was given today- please return it to our front desk. If you are over 70 years old - you may need Prevnar 13 or the adult Pneumonia vaccine.  **Flu shots will be available soon--- please call and schedule a FLU-CLINIC appointment**  After your visit with [place] today you will receive a survey in the mail or online from [employer] regarding your care with [place]. Please take a moment to fill this out. Your feedback is very important to [place] as you can help [place] better understand your patient needs as well as improve your experience and satisfaction. WE CARE ABOUT YOU!!!   **Please join [place] SEPT.[email or web address], 2016 from 5:00 to 7:00pm for our OPEN HOUSE! Come out and meet our NEW providers** Be happy to give you a referral for gastric sleeve bypass surgery if that is needed. Please continue to monitor blood sugars and blood pressures at home Bring blood pressure readings by for review and 3-4 weeks Watch sodium  intake   Nyra Capes MD

## 2015-08-09 LAB — NMR, LIPOPROFILE
Cholesterol: 200 mg/dL — ABNORMAL HIGH (ref 100–199)
HDL CHOLESTEROL BY NMR: 30 mg/dL — AB (ref >39)
HDL Particle Number: 28.3 umol/L — ABNORMAL LOW (ref >=30.5)
LDL PARTICLE NUMBER: 2403 nmol/L — AB (ref <1000)
LDL Size: 19.6 nm (ref >20.5)
LDL-C: 92 mg/dL (ref 0–99)
LP-IR Score: 84 — ABNORMAL HIGH (ref <=45)
Small LDL Particle Number: 1758 nmol/L — ABNORMAL HIGH (ref <=527)
TRIGLYCERIDES BY NMR: 390 mg/dL — AB (ref 0–149)

## 2015-08-09 LAB — CBC WITH DIFFERENTIAL/PLATELET
BASOS: 1 %
Basophils Absolute: 0 10*3/uL (ref 0.0–0.2)
EOS (ABSOLUTE): 0.1 10*3/uL (ref 0.0–0.4)
EOS: 1 %
HEMATOCRIT: 42.1 % (ref 34.0–46.6)
HEMOGLOBIN: 14.2 g/dL (ref 11.1–15.9)
Immature Grans (Abs): 0 10*3/uL (ref 0.0–0.1)
Immature Granulocytes: 0 %
LYMPHS ABS: 3.4 10*3/uL — AB (ref 0.7–3.1)
Lymphs: 50 %
MCH: 30.9 pg (ref 26.6–33.0)
MCHC: 33.7 g/dL (ref 31.5–35.7)
MCV: 92 fL (ref 79–97)
MONOCYTES: 5 %
MONOS ABS: 0.3 10*3/uL (ref 0.1–0.9)
NEUTROS ABS: 2.9 10*3/uL (ref 1.4–7.0)
Neutrophils: 43 %
Platelets: 312 10*3/uL (ref 150–379)
RBC: 4.59 x10E6/uL (ref 3.77–5.28)
RDW: 14.4 % (ref 12.3–15.4)
WBC: 6.8 10*3/uL (ref 3.4–10.8)

## 2015-08-09 LAB — HEPATIC FUNCTION PANEL
ALBUMIN: 4.9 g/dL — AB (ref 3.6–4.8)
ALK PHOS: 52 IU/L (ref 39–117)
ALT: 46 IU/L — ABNORMAL HIGH (ref 0–32)
AST: 39 IU/L (ref 0–40)
BILIRUBIN, DIRECT: 0.13 mg/dL (ref 0.00–0.40)
Bilirubin Total: 0.4 mg/dL (ref 0.0–1.2)
TOTAL PROTEIN: 6.8 g/dL (ref 6.0–8.5)

## 2015-08-09 LAB — BMP8+EGFR
BUN/Creatinine Ratio: 34 — ABNORMAL HIGH (ref 11–26)
BUN: 21 mg/dL (ref 8–27)
CO2: 24 mmol/L (ref 18–29)
CREATININE: 0.62 mg/dL (ref 0.57–1.00)
Calcium: 10.1 mg/dL (ref 8.7–10.3)
Chloride: 98 mmol/L (ref 97–108)
GFR calc Af Amer: 110 mL/min/{1.73_m2} (ref >59)
GFR calc non Af Amer: 96 mL/min/{1.73_m2} (ref >59)
Glucose: 106 mg/dL — ABNORMAL HIGH (ref 65–99)
Potassium: 4.1 mmol/L (ref 3.5–5.2)
SODIUM: 141 mmol/L (ref 134–144)

## 2015-08-09 LAB — VITAMIN D 25 HYDROXY (VIT D DEFICIENCY, FRACTURES): VIT D 25 HYDROXY: 39.5 ng/mL (ref 30.0–100.0)

## 2015-08-11 ENCOUNTER — Other Ambulatory Visit: Payer: Self-pay | Admitting: *Deleted

## 2015-08-11 MED ORDER — ICOSAPENT ETHYL 1 G PO CAPS: 2.0000 | ORAL_CAPSULE | Freq: Two times a day (BID) | ORAL | Status: DC

## 2015-08-11 MED ORDER — EZETIMIBE 10 MG PO TABS: 10.0000 mg | ORAL_TABLET | Freq: Every day | ORAL | Status: DC

## 2015-08-14 ENCOUNTER — Other Ambulatory Visit: Payer: Self-pay | Admitting: Family Medicine

## 2015-08-20 ENCOUNTER — Encounter: Payer: Self-pay | Admitting: Physician Assistant

## 2015-08-20 ENCOUNTER — Ambulatory Visit (INDEPENDENT_AMBULATORY_CARE_PROVIDER_SITE_OTHER): Payer: 59 | Admitting: Physician Assistant

## 2015-08-20 VITALS — BP 129/74 | HR 73 | Temp 97.1°F | Ht 60.0 in | Wt 198.0 lb

## 2015-08-20 DIAGNOSIS — D485 Neoplasm of uncertain behavior of skin: Secondary | ICD-10-CM

## 2015-08-20 DIAGNOSIS — L570 Actinic keratosis: Secondary | ICD-10-CM | POA: Diagnosis not present

## 2015-08-20 DIAGNOSIS — L820 Inflamed seborrheic keratosis: Secondary | ICD-10-CM

## 2015-08-20 NOTE — Progress Notes (Signed)
Subjective:    Patient ID: Melissa Bowman, female    DOB: 04/09/1951, 64 y.o.   MRN: [id number]  HPI 64 y/o female presents for skin check. She has been seen by Dr. Margo Aye in [place] in the past. No h/o skin CA. Fair skin. No family h/o skin CA.     Review of Systems  Skin:       She has a lesion on right medial thigh of concern today that has changed in size.        Objective:   Physical Exam  Skin:  Numerous hyperkeratotic brown macules on back and abdomen, benign in appearance and appear to be seborrheic keratosis in etiologiy  Hyperkeratotic papule on right medial knee, .6mm  Inflamed seborrheic keratosis .6mm on right flank  Atypical appearing nevi on right lower abdomen .7mm   Areas of actinic changes on BUE and face          Assessment & Plan:  1. Neoplasm of uncertain behavior of skin  - Pathology of lesion on right medial knee to r/o dysplasia  - Pathology of lesion on RLQ to r/o dysplasia   2. Actinic keratoses - Lesions treated with cryosurgery x 7   3. Inflamed seborrheic keratoses  - removed via shave - no pathology indicated.   RTO prn pending path results   Tiffany A. Chauncey Reading PA-C

## 2015-08-22 LAB — PATHOLOGY

## 2015-08-26 NOTE — Progress Notes (Signed)
Patient aware.

## 2015-09-03 ENCOUNTER — Encounter: Payer: Self-pay | Admitting: Physician Assistant

## 2015-09-03 ENCOUNTER — Ambulatory Visit (INDEPENDENT_AMBULATORY_CARE_PROVIDER_SITE_OTHER): Payer: 59 | Admitting: Physician Assistant

## 2015-09-03 VITALS — BP 147/88 | HR 80 | Temp 97.8°F | Ht 60.0 in | Wt 198.0 lb

## 2015-09-03 DIAGNOSIS — R399 Unspecified symptoms and signs involving the genitourinary system: Secondary | ICD-10-CM | POA: Diagnosis not present

## 2015-09-03 DIAGNOSIS — N390 Urinary tract infection, site not specified: Secondary | ICD-10-CM | POA: Diagnosis not present

## 2015-09-03 LAB — POCT URINALYSIS DIPSTICK
Bilirubin, UA: NEGATIVE
Glucose, UA: NEGATIVE
KETONES UA: NEGATIVE
Nitrite, UA: POSITIVE
SPEC GRAV UA: 1.01
Urobilinogen, UA: NEGATIVE
pH, UA: 7.5

## 2015-09-03 LAB — POCT UA - MICROSCOPIC ONLY
CASTS, UR, LPF, POC: NEGATIVE
Crystals, Ur, HPF, POC: NEGATIVE
MUCUS UA: NEGATIVE
YEAST UA: NEGATIVE

## 2015-09-03 MED ORDER — CIPROFLOXACIN HCL 500 MG PO TABS: 500.0000 mg | ORAL_TABLET | Freq: Two times a day (BID) | ORAL | Status: DC

## 2015-09-03 NOTE — Progress Notes (Signed)
Subjective:    Patient ID: Melissa Bowman, female    DOB: Oct 25, 1951, 64 y.o.   MRN: [id number]  HPI 64 y/o female presents with c/o recurrent polyuria and urgency. She had a consult with urology a few months ago, was told that she had small renal stones and has a follow up U/S in Jan 2017.     Review of Systems  Constitutional: Negative.   HENT: Negative.   Eyes: Negative.   Respiratory: Negative.   Cardiovascular: Negative.   Gastrointestinal: Negative.   Endocrine: Positive for polyuria.  Genitourinary: Positive for dysuria (when she first starts to urinate), urgency and frequency.       Objective:   Physical Exam Results for orders placed or performed in visit on 09/03/15  POCT urinalysis dipstick  Result Value Ref Range   Color, UA gold    Clarity, UA cloudy    Glucose, UA neg    Bilirubin, UA neg    Ketones, UA neg    Spec Grav, UA 1.010    Blood, UA moderate    pH, UA 7.5    Protein, UA trace    Urobilinogen, UA negative    Nitrite, UA positive    Leukocytes, UA large (3+) (A) Negative  POCT UA - Microscopic Only  Result Value Ref Range   WBC, Ur, HPF, POC tntc    RBC, urine, microscopic occ    Bacteria, U Microscopic many    Mucus, UA neg    Epithelial cells, urine per micros occ    Crystals, Ur, HPF, POC neg    Casts, Ur, LPF, POC neg    Yeast, UA neg         Assessment & Plan:  1. UTI symptoms  - POCT urinalysis dipstick - POCT UA - Microscopic Only - Urine culture - will change if needed pending culture results  - Cipro 500mg  BID x 10 days  2. Recurrent UTI  Will have patient f/u in 2 weeks for recheck of urine    RTO 2 weeks   Tiffany A. Chauncey Reading PA-C

## 2015-09-05 LAB — URINE CULTURE

## 2015-09-12 ENCOUNTER — Other Ambulatory Visit: Payer: Self-pay | Admitting: Family Medicine

## 2015-09-15 ENCOUNTER — Other Ambulatory Visit: Payer: Self-pay | Admitting: Family Medicine

## 2015-09-16 ENCOUNTER — Other Ambulatory Visit (INDEPENDENT_AMBULATORY_CARE_PROVIDER_SITE_OTHER): Payer: 59

## 2015-09-16 DIAGNOSIS — R300 Dysuria: Secondary | ICD-10-CM | POA: Diagnosis not present

## 2015-09-16 LAB — POCT URINALYSIS DIPSTICK
BILIRUBIN UA: NEGATIVE
Blood, UA: NEGATIVE
Glucose, UA: NEGATIVE
KETONES UA: NEGATIVE
LEUKOCYTES UA: NEGATIVE
NITRITE UA: NEGATIVE
PH UA: 7.5
PROTEIN UA: NEGATIVE
Spec Grav, UA: 1.01
Urobilinogen, UA: NEGATIVE

## 2015-09-16 LAB — POCT UA - MICROSCOPIC ONLY
Bacteria, U Microscopic: NEGATIVE
Casts, Ur, LPF, POC: NEGATIVE
Crystals, Ur, HPF, POC: NEGATIVE
MUCUS UA: NEGATIVE
RBC, URINE, MICROSCOPIC: NEGATIVE
WBC, UR, HPF, POC: NEGATIVE

## 2015-09-16 NOTE — Progress Notes (Signed)
Lab only 

## 2015-09-18 ENCOUNTER — Ambulatory Visit: Payer: 59 | Admitting: Nurse Practitioner

## 2015-09-19 ENCOUNTER — Encounter: Payer: Self-pay | Admitting: Family Medicine

## 2015-09-19 ENCOUNTER — Other Ambulatory Visit: Payer: Self-pay | Admitting: Family Medicine

## 2015-09-25 ENCOUNTER — Other Ambulatory Visit: Payer: Self-pay | Admitting: Family Medicine

## 2015-10-06 ENCOUNTER — Encounter: Payer: Self-pay | Admitting: *Deleted

## 2015-10-06 NOTE — Progress Notes (Signed)
Patient came in for blood pressure rck.

## 2015-10-15 DIAGNOSIS — Z23 Encounter for immunization: Secondary | ICD-10-CM | POA: Diagnosis not present

## 2015-10-16 ENCOUNTER — Ambulatory Visit (INDEPENDENT_AMBULATORY_CARE_PROVIDER_SITE_OTHER): Payer: 59

## 2015-10-16 DIAGNOSIS — Z23 Encounter for immunization: Secondary | ICD-10-CM

## 2015-11-18 ENCOUNTER — Other Ambulatory Visit: Payer: Self-pay | Admitting: Family Medicine

## 2015-11-19 ENCOUNTER — Encounter: Payer: Self-pay | Admitting: Family Medicine

## 2015-11-19 LAB — HM DIABETES EYE EXAM

## 2015-11-19 NOTE — Telephone Encounter (Signed)
Last seen 09/03/15  Melissa Bowman

## 2015-11-20 ENCOUNTER — Encounter: Payer: Self-pay | Admitting: *Deleted

## 2015-11-20 ENCOUNTER — Other Ambulatory Visit: Payer: Self-pay

## 2015-11-20 MED ORDER — BUDESONIDE-FORMOTEROL FUMARATE 160-4.5 MCG/ACT IN AERO: 2.0000 | INHALATION_SPRAY | Freq: Two times a day (BID) | RESPIRATORY_TRACT | Status: DC

## 2015-11-21 ENCOUNTER — Ambulatory Visit (HOSPITAL_COMMUNITY): Payer: 59

## 2015-11-21 ENCOUNTER — Ambulatory Visit (INDEPENDENT_AMBULATORY_CARE_PROVIDER_SITE_OTHER): Payer: 59 | Admitting: Urology

## 2015-11-21 ENCOUNTER — Ambulatory Visit (HOSPITAL_COMMUNITY)
Admission: RE | Admit: 2015-11-21 | Discharge: 2015-11-21 | Disposition: A | Discharge status: Home / Self Care | Payer: 59 | Source: Ambulatory Visit | Attending: Urology | Admitting: Urology

## 2015-11-21 ENCOUNTER — Other Ambulatory Visit: Payer: Self-pay | Admitting: Urology

## 2015-11-21 DIAGNOSIS — N3941 Urge incontinence: Secondary | ICD-10-CM | POA: Diagnosis not present

## 2015-11-21 DIAGNOSIS — Z9049 Acquired absence of other specified parts of digestive tract: Secondary | ICD-10-CM | POA: Insufficient documentation

## 2015-11-21 DIAGNOSIS — Z87448 Personal history of other diseases of urinary system: Secondary | ICD-10-CM | POA: Diagnosis not present

## 2015-11-21 DIAGNOSIS — K760 Fatty (change of) liver, not elsewhere classified: Secondary | ICD-10-CM | POA: Diagnosis not present

## 2015-11-21 DIAGNOSIS — N200 Calculus of kidney: Secondary | ICD-10-CM

## 2015-11-21 DIAGNOSIS — D3502 Benign neoplasm of left adrenal gland: Secondary | ICD-10-CM | POA: Diagnosis not present

## 2015-11-21 DIAGNOSIS — R109 Unspecified abdominal pain: Secondary | ICD-10-CM | POA: Insufficient documentation

## 2015-11-24 ENCOUNTER — Other Ambulatory Visit: Payer: Self-pay | Admitting: *Deleted

## 2015-11-24 DIAGNOSIS — G4733 Obstructive sleep apnea (adult) (pediatric): Secondary | ICD-10-CM

## 2015-12-04 ENCOUNTER — Other Ambulatory Visit: Payer: Self-pay | Admitting: Family Medicine

## 2015-12-05 NOTE — Telephone Encounter (Signed)
Last seen 09/03/15  Melissa Bowman

## 2015-12-16 ENCOUNTER — Other Ambulatory Visit: Payer: Self-pay | Admitting: Urology

## 2015-12-16 DIAGNOSIS — N200 Calculus of kidney: Secondary | ICD-10-CM

## 2015-12-26 ENCOUNTER — Ambulatory Visit: Payer: 59 | Admitting: Urology

## 2016-01-07 ENCOUNTER — Ambulatory Visit: Payer: 59 | Admitting: Family Medicine

## 2016-01-09 ENCOUNTER — Ambulatory Visit: Payer: 59 | Admitting: Family Medicine

## 2016-01-09 ENCOUNTER — Other Ambulatory Visit (INDEPENDENT_AMBULATORY_CARE_PROVIDER_SITE_OTHER): Payer: 59

## 2016-01-09 DIAGNOSIS — E559 Vitamin D deficiency, unspecified: Secondary | ICD-10-CM

## 2016-01-09 DIAGNOSIS — E119 Type 2 diabetes mellitus without complications: Secondary | ICD-10-CM | POA: Diagnosis not present

## 2016-01-09 DIAGNOSIS — E785 Hyperlipidemia, unspecified: Secondary | ICD-10-CM

## 2016-01-09 DIAGNOSIS — I10 Essential (primary) hypertension: Secondary | ICD-10-CM

## 2016-01-09 DIAGNOSIS — K219 Gastro-esophageal reflux disease without esophagitis: Secondary | ICD-10-CM

## 2016-01-09 LAB — POCT GLYCOSYLATED HEMOGLOBIN (HGB A1C): HEMOGLOBIN A1C: 5.6

## 2016-01-10 LAB — CBC WITH DIFFERENTIAL/PLATELET
BASOS: 1 %
Basophils Absolute: 0 10*3/uL (ref 0.0–0.2)
EOS (ABSOLUTE): 0.1 10*3/uL (ref 0.0–0.4)
Eos: 2 %
Hematocrit: 40.3 % (ref 34.0–46.6)
Hemoglobin: 14 g/dL (ref 11.1–15.9)
IMMATURE GRANULOCYTES: 0 %
Immature Grans (Abs): 0 10*3/uL (ref 0.0–0.1)
Lymphocytes Absolute: 3.1 10*3/uL (ref 0.7–3.1)
Lymphs: 48 %
MCH: 32.3 pg (ref 26.6–33.0)
MCHC: 34.7 g/dL (ref 31.5–35.7)
MCV: 93 fL (ref 79–97)
MONOS ABS: 0.3 10*3/uL (ref 0.1–0.9)
Monocytes: 4 %
NEUTROS ABS: 2.8 10*3/uL (ref 1.4–7.0)
NEUTROS PCT: 45 %
PLATELETS: 317 10*3/uL (ref 150–379)
RBC: 4.33 x10E6/uL (ref 3.77–5.28)
RDW: 14.1 % (ref 12.3–15.4)
WBC: 6.3 10*3/uL (ref 3.4–10.8)

## 2016-01-10 LAB — BMP8+EGFR
BUN/Creatinine Ratio: 38 — ABNORMAL HIGH (ref 11–26)
BUN: 22 mg/dL (ref 8–27)
CHLORIDE: 99 mmol/L (ref 96–106)
CO2: 26 mmol/L (ref 18–29)
Calcium: 9.6 mg/dL (ref 8.7–10.3)
Creatinine, Ser: 0.58 mg/dL (ref 0.57–1.00)
GFR calc Af Amer: 112 mL/min/{1.73_m2} (ref >59)
GFR, EST NON AFRICAN AMERICAN: 97 mL/min/{1.73_m2} (ref >59)
Glucose: 89 mg/dL (ref 65–99)
POTASSIUM: 3.8 mmol/L (ref 3.5–5.2)
SODIUM: 142 mmol/L (ref 134–144)

## 2016-01-10 LAB — HEPATIC FUNCTION PANEL
ALT: 40 IU/L — AB (ref 0–32)
AST: 34 IU/L (ref 0–40)
Albumin: 4.7 g/dL (ref 3.6–4.8)
Alkaline Phosphatase: 49 IU/L (ref 39–117)
Bilirubin Total: 0.3 mg/dL (ref 0.0–1.2)
Bilirubin, Direct: 0.12 mg/dL (ref 0.00–0.40)
Total Protein: 6.9 g/dL (ref 6.0–8.5)

## 2016-01-10 LAB — NMR, LIPOPROFILE
Cholesterol: 199 mg/dL (ref 100–199)
HDL CHOLESTEROL BY NMR: 30 mg/dL — AB (ref >39)
HDL Particle Number: 30.3 umol/L — ABNORMAL LOW (ref >=30.5)
LDL Particle Number: 2139 nmol/L — ABNORMAL HIGH (ref <1000)
LDL Size: 19.7 nm (ref >20.5)
LDL-C: 98 mg/dL (ref 0–99)
LP-IR Score: 84 — ABNORMAL HIGH (ref <=45)
Small LDL Particle Number: 1768 nmol/L — ABNORMAL HIGH (ref <=527)
Triglycerides by NMR: 356 mg/dL — ABNORMAL HIGH (ref 0–149)

## 2016-01-10 LAB — VITAMIN D 25 HYDROXY (VIT D DEFICIENCY, FRACTURES): VIT D 25 HYDROXY: 44.1 ng/mL (ref 30.0–100.0)

## 2016-01-15 ENCOUNTER — Ambulatory Visit (HOSPITAL_COMMUNITY)
Admission: RE | Admit: 2016-01-15 | Discharge: 2016-01-15 | Disposition: A | Discharge status: Home / Self Care | Payer: 59 | Source: Ambulatory Visit | Attending: Urology | Admitting: Urology

## 2016-01-15 ENCOUNTER — Other Ambulatory Visit: Payer: Self-pay | Admitting: Urology

## 2016-01-15 DIAGNOSIS — N200 Calculus of kidney: Secondary | ICD-10-CM

## 2016-01-21 ENCOUNTER — Ambulatory Visit (INDEPENDENT_AMBULATORY_CARE_PROVIDER_SITE_OTHER): Payer: 59 | Admitting: Family Medicine

## 2016-01-21 ENCOUNTER — Ambulatory Visit (INDEPENDENT_AMBULATORY_CARE_PROVIDER_SITE_OTHER): Payer: 59

## 2016-01-21 ENCOUNTER — Encounter: Payer: Self-pay | Admitting: Family Medicine

## 2016-01-21 VITALS — BP 115/75 | HR 83 | Temp 98.6°F | Ht 60.0 in | Wt 190.0 lb

## 2016-01-21 DIAGNOSIS — Z780 Asymptomatic menopausal state: Secondary | ICD-10-CM

## 2016-01-21 DIAGNOSIS — K219 Gastro-esophageal reflux disease without esophagitis: Secondary | ICD-10-CM

## 2016-01-21 DIAGNOSIS — F418 Other specified anxiety disorders: Secondary | ICD-10-CM

## 2016-01-21 DIAGNOSIS — Z13820 Encounter for screening for osteoporosis: Secondary | ICD-10-CM

## 2016-01-21 DIAGNOSIS — I10 Essential (primary) hypertension: Secondary | ICD-10-CM | POA: Diagnosis not present

## 2016-01-21 DIAGNOSIS — E785 Hyperlipidemia, unspecified: Secondary | ICD-10-CM | POA: Diagnosis not present

## 2016-01-21 DIAGNOSIS — E559 Vitamin D deficiency, unspecified: Secondary | ICD-10-CM

## 2016-01-21 DIAGNOSIS — G4737 Central sleep apnea in conditions classified elsewhere: Secondary | ICD-10-CM

## 2016-01-21 DIAGNOSIS — K760 Fatty (change of) liver, not elsewhere classified: Secondary | ICD-10-CM

## 2016-01-21 DIAGNOSIS — F419 Anxiety disorder, unspecified: Secondary | ICD-10-CM

## 2016-01-21 DIAGNOSIS — E119 Type 2 diabetes mellitus without complications: Secondary | ICD-10-CM | POA: Diagnosis not present

## 2016-01-21 DIAGNOSIS — F329 Major depressive disorder, single episode, unspecified: Secondary | ICD-10-CM

## 2016-01-21 DIAGNOSIS — F32A Depression, unspecified: Secondary | ICD-10-CM

## 2016-01-21 MED ORDER — ROSUVASTATIN CALCIUM 5 MG PO TABS: 5.0000 mg | ORAL_TABLET | Freq: Every day | ORAL | Status: DC

## 2016-01-21 NOTE — Progress Notes (Signed)
Subjective:    Patient ID: Melissa Bowman, female    DOB: [birth year], 65 y.o.   MRN: [id number]  HPI Pt here for follow up and management of chronic medical problems which includes diabetes and hyperlipidemia. She is taking medications regularly. The patient is doing well and is in good spirits today and has no specific complaints. She is to get a DEXA scan and this hopefully will be done today. She  Will be given an FOBT to return and she has had her lab work done and this will be reviewed with her during the visit today. Blood sugars brought in from an outside are all good and will be scanned into the record. Prior to coming into the exam room she did have a DEXA scan today and still has osteopenia in both hips with spine films having normal bone density. We will make sure that she continues with calcium 1200 mg daily and vitamin D at least 1000 international units daily she should have a repeat DEXA scan done in a couple of years. Patient denies chest pain shortness of breath trouble swallowing and heartburn indigestion nausea vomiting diarrhea or blood in the stool. She is passing her water without problems.     Patient Active Problem List   Diagnosis Date Noted  . Osteopenia 12/26/2013  . Type 2 diabetes mellitus (HCC) 10/31/2013  . Hypertension 05/14/2013  . Hyperlipidemia 05/14/2013  . Depression 05/14/2013  . Allergic rhinitis 05/14/2013  . Vitamin D deficiency 05/14/2013  . Hemorrhage of rectum and anus 09/15/2011  . Dysphagia, pharyngoesophageal phase 09/15/2011  . NAFLD (nonalcoholic fatty liver disease) 03/47/4259  . Chronic RUQ pain 09/15/2011  . Diverticulosis of colon (without mention of hemorrhage) 09/15/2011  . BRBPR (bright red blood per rectum) 08/27/2011  . Internal hemorrhoids without mention of complication 08/27/2011  . Esophageal reflux 08/27/2011  . Abdominal pain 08/27/2011  . Esophageal dysphagia 08/27/2011  . Obesity 08/27/2011  . Anxiety and depression  08/27/2011  . 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 albuterol (PROVENTIL, VENTOLIN) (5 MG/ML) 0.5% NEBU Take by nebulization continuous.    Marland Kitchen amitriptyline (ELAVIL) 10 MG tablet Take 1 tablet by mouth at  bedtime  . amLODipine (NORVASC) 10 MG tablet TAKE 1 TABLET BY MOUTH  DAILY FOR BLOOD PRESSURE AS DIRECTED  . aspirin EC 81 MG tablet Take 1 tablet (81 mg total) by mouth daily.  . Calcium 1200-1000 MG-UNIT CHEW Chew by mouth.    . Cholecalciferol (VITAMIN D-3) 5000 UNITS TABS Take by mouth daily.    . fenofibrate 160 MG tablet Take 1 tablet by mouth  daily  . fluticasone (FLONASE) 50 MCG/ACT nasal spray Use 2 sprays in each  nostril daily  . losartan-hydrochlorothiazide (HYZAAR) 100-25 MG tablet Take 1 tablet by mouth  daily as directed  . metFORMIN (GLUCOPHAGE) 1000 MG tablet Take 1 tablet (1,000 mg  total) by mouth 2 times  daily with a meal.  . montelukast (SINGULAIR) 10 MG tablet Take 1 tablet by mouth  daily  . omeprazole (PRILOSEC) 40 MG capsule Take 1 capsule by mouth  daily  . ONE TOUCH ULTRA TEST test strip TEST BLOOD SUGAR 3 TIMES A DAY  . phenazopyridine (PYRIDIUM) 200 MG tablet Take 1 tablet by mouth 3  times a day as needed for  pain  . PROAIR HFA 108 (90 BASE) MCG/ACT inhaler Use 2 puffs into the lungs  every  6 hours as needed for wheezing or shortness of  breath  . raloxifene (EVISTA) 60 MG tablet Take 1 tablet by mouth  daily  . SYMBICORT 160-4.5 MCG/ACT inhaler Use 2 puffs two times daily  . venlafaxine (EFFEXOR) 75 MG tablet Take 1 tablet by mouth 3  times a day  . [DISCONTINUED] ezetimibe (ZETIA) 10 MG tablet Take 1 tablet (10 mg total) by mouth daily.  . [DISCONTINUED] Fenofibric Acid 105 MG TABS Take 1 tablet (105 mg total) by mouth daily.  . [DISCONTINUED] Icosapent Ethyl (VASCEPA) 1 G CAPS Take 2 capsules by mouth 2 (two) times daily.  . [DISCONTINUED] ciprofloxacin (CIPRO) 500  MG tablet Take 1 tablet (500 mg total) by mouth 2 (two) times daily.  . [DISCONTINUED] ONE TOUCH ULTRA TEST test strip TEST BLOOD SUGAR 3 TIMES A DAY   No facility-administered encounter medications on file as of 01/21/2016.        Review of Systems  Constitutional: Negative.   HENT: Negative.   Eyes: Negative.   Respiratory: Negative.   Cardiovascular: Negative.   Gastrointestinal: Negative.   Endocrine: Negative.   Genitourinary: Negative.   Musculoskeletal: Negative.   Skin: Negative.   Allergic/Immunologic: Negative.   Neurological: Negative.   Hematological: Negative.   Psychiatric/Behavioral: Negative.        Objective:   Physical Exam  Constitutional: She is oriented to person, place, and time. She appears well-developed and well-nourished. No distress.  HENT:  Head: Normocephalic and atraumatic.  Right Ear: External ear normal.  Left Ear: External ear normal.  Nose: Nose normal.  Mouth/Throat: No oropharyngeal exudate.  The patient has had soft palate surgery and all appears within normal limits following the surgery. This was years ago.  Eyes: Conjunctivae and EOM are normal. Pupils are equal, round, and reactive to light. Right eye exhibits no discharge. Left eye exhibits no discharge. No scleral icterus.  Neck: Normal range of motion. Neck supple. No thyromegaly present.  Cardiovascular: Normal rate, regular rhythm and intact distal pulses.   No murmur heard. Heart was regular at 72/m  Pulmonary/Chest: Effort normal and breath sounds normal. No respiratory distress. She has no wheezes. She has no rales. She exhibits no tenderness.  Clear anteriorly and posteriorly  Abdominal: Soft. Bowel sounds are normal. She exhibits no mass. There is no tenderness. There is no rebound and no guarding.  The abdomen is obese but the patient has lost 8 pounds since the last visit and she was applauded for this. There are no organ enlargement or masses or bruits.    Musculoskeletal: Normal range of motion. She exhibits no edema.  Lymphadenopathy:    She has no cervical adenopathy.  Neurological: She is alert and oriented to person, place, and time. She has normal reflexes. No cranial nerve deficit.  Skin: Skin is warm and dry. No rash noted.  Psychiatric: She has a normal mood and affect. Her behavior is normal. Judgment and thought content normal.  Nursing note and vitals reviewed.   BP 115/75 mmHg  Pulse 83  Temp(Src) 98.6 F (37 C) (Oral)  Ht 5' (1.524 m)  Wt 190 lb (86.183 kg)  BMI 37.11 kg/m2       Assessment & Plan:  1. Screening for osteoporosis -The DEXA results were reviewed with the patient today she does have osteopenia and both hips and this is been stable since the last check and she will continue with vitamin D replacement and calcium replacement. - DG Bone Density; Future  2.  Postmenopausal -She is scheduled to have a pelvic exam done by one of the providers in the office. - DG Bone Density; Future  3. Type 2 diabetes mellitus without complication, without long-term current use of insulin (HCC) -The patient's recent A1c was excellent and she will continue with her weight loss efforts and current treatment for her blood sugar make sure she gets her eye exams once yearly.  4. Essential hypertension -The blood pressure is good today and she will continue with current treatment  5. Hyperlipidemia -The patient has been statin intolerant in the past. We will retry Crestor 5 mg Monday Wednesday and Friday and she will continue with fenofibrate. At the same time she will be scheduled to visit with the clinical pharmacist to discuss the possibility of PCS K-9 inhibitor therapy.  6. Vitamin D deficiency -Continue vitamin D replacement   7. Gastroesophageal reflux disease, esophagitis presence not specified -She is having no complaints with this today. She will continue with her omeprazole.  8. NAFLD (nonalcoholic fatty liver  disease) -1 liver function test remains elevated but is lower than in the past.  9. 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 Anxiety and depression -The patient did not appear anxious today and seems to be content with her current health condition other than her elevated cholesterol.  Patient Instructions                       Medicare Annual Wellness Visit  [place] and the medical providers at [facility] strive to bring you the best medical care.  In doing so we not only want to address your current medical conditions and concerns but also to detect new conditions early and prevent illness, disease and health-related problems.    Medicare offers a yearly Wellness Visit which allows our clinical staff to assess your need for preventative services including immunizations, lifestyle education, counseling to decrease risk of preventable diseases and screening for fall risk and other medical concerns.    This visit is provided free of charge (no copay) for all Medicare recipients. The clinical pharmacists at [facility] have begun to conduct these Wellness Visits which will also include a thorough review of all your medications.    As you primary medical provider recommend that you make an appointment for your Annual Wellness Visit if you have not done so already this year.  You may set up this appointment before you leave today or you may call back ([phone]) and schedule an appointment.  Please make sure when you call that you mention that you are scheduling your Annual Wellness Visit with the clinical pharmacist so that the appointment may be made for the proper length of time.      Continue current medications. Continue good therapeutic lifestyle changes which include good diet and exercise. Fall precautions discussed with patient. If an FOBT was given today- please return it to our front desk. If you are over 51 years old - you may  need Prevnar 13 or the adult Pneumonia vaccine.  **Flu shots are available--- please call and schedule a FLU-CLINIC appointment**  After your visit with [place] today you will receive a survey in the mail or online from [employer] regarding your care with [place]. Please take a moment to fill this out. Your feedback is very important to [place] as you can help [place] better understand your patient needs as well as improve your experience and satisfaction. WE  CARE ABOUT YOU!!!   The patient will be given an appointment to meet with the clinical pharmacist to discuss her elevated LDL particle number and triglycerides and hopefully to be considered for PCS K-9 inhibitor treatment. In the meantime she will retry one more time Crestor 5 mg Monday Wednesday and Friday to see if she tolerates this. She will continue with aggressive therapeutic lifestyle changes watching her blood sugar closely exercising and watching her diet closely and reducing her weight. She will also return to the office in 2-3 weeks to recheck the right arm lesion cryotherapy site.     Nyra Capes MD

## 2016-01-21 NOTE — Patient Instructions (Addendum)
Medicare Annual Wellness Visit  [facility] and the medical providers at [facility] strive to bring you the best medical care.  In doing so we not only want to address your current medical conditions and concerns but also to detect new conditions early and prevent illness, disease and health-related problems.    Medicare offers a yearly Wellness Visit which allows our clinical staff to assess your need for preventative services including immunizations, lifestyle education, counseling to decrease risk of preventable diseases and screening for fall risk and other medical concerns.    This visit is provided free of charge (no copay) for all Medicare recipients. The clinical pharmacists at [facility] have begun to conduct these Wellness Visits which will also include a thorough review of all your medications.    As you primary medical provider recommend that you make an appointment for your Annual Wellness Visit if you have not done so already this year.  You may set up this appointment before you leave today or you may call back [phone] and schedule an appointment.  Please make sure when you call that you mention that you are scheduling your Annual Wellness Visit with the clinical pharmacist so that the appointment may be made for the proper length of time.      Continue current medications. Continue good therapeutic lifestyle changes which include good diet and exercise. Fall precautions discussed with patient. If an FOBT was given today- please return it to our front desk. If you are over 63 years old - you may need Prevnar 12 or the adult Pneumonia vaccine.  **Flu shots are available--- please call and schedule a FLU-CLINIC appointment**  After your visit with [place] today you will receive a survey in the mail or online from [employer] regarding your care with [place]. Please take a moment to fill this out. Your feedback is very  important to [place] as you can help [place] better understand your patient needs as well as improve your experience and satisfaction. WE CARE ABOUT YOU!!!   The patient will be given an appointment to meet with the clinical pharmacist to discuss her elevated LDL particle number and triglycerides and hopefully to be considered for PCS K-9 inhibitor treatment. In the meantime she will retry one more time Crestor 5 mg Monday Wednesday and Friday to see if she tolerates this. She will continue with aggressive therapeutic lifestyle changes watching her blood sugar closely exercising and watching her diet closely and reducing her weight. She will also return to the office in 2-3 weeks to recheck the right arm lesion cryotherapy site.

## 2016-01-27 ENCOUNTER — Other Ambulatory Visit: Payer: 59

## 2016-01-27 DIAGNOSIS — Z1212 Encounter for screening for malignant neoplasm of rectum: Secondary | ICD-10-CM

## 2016-01-29 LAB — FECAL OCCULT BLOOD, IMMUNOCHEMICAL: Fecal Occult Bld: NEGATIVE

## 2016-01-30 ENCOUNTER — Ambulatory Visit (INDEPENDENT_AMBULATORY_CARE_PROVIDER_SITE_OTHER): Payer: 59 | Admitting: Urology

## 2016-01-30 DIAGNOSIS — Z87442 Personal history of urinary calculi: Secondary | ICD-10-CM | POA: Diagnosis not present

## 2016-01-30 DIAGNOSIS — N3020 Other chronic cystitis without hematuria: Secondary | ICD-10-CM | POA: Diagnosis not present

## 2016-01-30 DIAGNOSIS — N3941 Urge incontinence: Secondary | ICD-10-CM

## 2016-01-31 ENCOUNTER — Other Ambulatory Visit: Payer: Self-pay | Admitting: *Deleted

## 2016-01-31 ENCOUNTER — Telehealth: Payer: Self-pay | Admitting: *Deleted

## 2016-01-31 NOTE — Telephone Encounter (Signed)
Done

## 2016-02-03 ENCOUNTER — Encounter: Payer: Self-pay | Admitting: Pharmacist

## 2016-02-04 ENCOUNTER — Encounter: Payer: Self-pay | Admitting: Pharmacist

## 2016-02-04 ENCOUNTER — Ambulatory Visit (INDEPENDENT_AMBULATORY_CARE_PROVIDER_SITE_OTHER): Payer: 59 | Admitting: Pharmacist

## 2016-02-04 VITALS — BP 138/88 | HR 80 | Ht 60.0 in | Wt 192.0 lb

## 2016-02-04 DIAGNOSIS — E785 Hyperlipidemia, unspecified: Secondary | ICD-10-CM

## 2016-02-04 DIAGNOSIS — E1169 Type 2 diabetes mellitus with other specified complication: Secondary | ICD-10-CM | POA: Diagnosis not present

## 2016-02-04 DIAGNOSIS — E119 Type 2 diabetes mellitus without complications: Secondary | ICD-10-CM | POA: Diagnosis not present

## 2016-02-04 MED ORDER — ROSUVASTATIN CALCIUM 5 MG PO TABS: 5.0000 mg | ORAL_TABLET | Freq: Every day | ORAL | Status: DC

## 2016-02-04 MED ORDER — AMITRIPTYLINE HCL 10 MG PO TABS: 10.0000 mg | ORAL_TABLET | Freq: Every day | ORAL | Status: DC

## 2016-02-04 NOTE — Progress Notes (Signed)
Subjective:    Melissa Bowman is a 65 y.o. female who presents for evaluation of dyslipidemia. Exercise: rarely. Previous history of cardiac disease includes: None. Patient does have type 2 Dm which is controlled.  Cardiac Risk Factors Age > 45-female, > 55-female: 1  Smoking:  0  Sig. family hx of CHD*: 0  Hypertension:  1  Diabetes:  1  HDL < 35:  1  HDL > 59:  0  Total: 4   *Significant family history of CHD per NCEP = MI or sudden death at less than 78 year old in father or other 1st-degree female relative, or less than 75 year old in mother or  other 1st-degree female relative  The following portions of the patient's history were reviewed and updated as appropriate: allergies, current medications, past family history, past medical history, past social history, past surgical history and problem list.    Objective:    Lab Review Lab on 01/27/2016  Component Date Value  . Fecal Occult Bld 01/27/2016 Negative   Lab on 01/09/2016  Component Date Value  . Hemoglobin A1C 01/09/2016 5.6   . Glucose 01/09/2016 89   . BUN 01/09/2016 22   . Creatinine, Ser 01/09/2016 0.58   . GFR calc non Af Amer 01/09/2016 97   . GFR calc Af Amer 01/09/2016 112   . BUN/Creatinine Ratio 01/09/2016 38*  . Sodium 01/09/2016 142   . Potassium 01/09/2016 3.8   . Chloride 01/09/2016 99   . CO2 01/09/2016 26   . Calcium 01/09/2016 9.6   . WBC 01/09/2016 6.3   . RBC 01/09/2016 4.33   . Hemoglobin 01/09/2016 14.0   . Hematocrit 01/09/2016 40.3   . MCV 01/09/2016 93   . [facility] 01/09/2016 32.3   . MCHC 01/09/2016 34.7   . RDW 01/09/2016 14.1   . Platelets 01/09/2016 317   . Neutrophils 01/09/2016 45   . Lymphs 01/09/2016 48   . Monocytes 01/09/2016 4   . Eos 01/09/2016 2   . Basos 01/09/2016 1   . Neutrophils Absolute 01/09/2016 2.8   . Lymphocytes Absolute 01/09/2016 3.1   . Monocytes Absolute 01/09/2016 0.3   . EOS (ABSOLUTE) 01/09/2016 0.1   . Basophils Absolute 01/09/2016 0.0   . Immature  Granulocytes 01/09/2016 0   . Immature Grans (Abs) 01/09/2016 0.0   . Total Protein 01/09/2016 6.9   . Albumin 01/09/2016 4.7   . Bilirubin Total 01/09/2016 0.3   . Bilirubin, Direct 01/09/2016 0.12   . Alkaline Phosphatase 01/09/2016 49   . AST 01/09/2016 34   . ALT 01/09/2016 40*  . Vit D, 25-Hydroxy 01/09/2016 44.1   . LDL Particle Number 01/09/2016 [id number]*  . LDL-C 01/09/2016 98   . HDL Cholesterol by NMR 01/09/2016 30*  . Triglycerides by NMR 01/09/2016 356*  . Cholesterol 01/09/2016 199   . HDL Particle Number 01/09/2016 30.3*  . Small LDL Particle Number 01/09/2016 1768*  . LDL Size 01/09/2016 19.7   . LP-IR Score 01/09/2016 84*  Scanned Document on 11/19/2015  Component Date Value  . HM Diabetic Eye Exam 11/19/2015 No Retinopathy   Lab on 09/16/2015  Component Date Value  . WBC, Ur, HPF, POC 09/16/2015 negative   . RBC, urine, microscopic 09/16/2015 negative   . Bacteria, U Microscopic 09/16/2015 negative   . Mucus, UA 09/16/2015 negative   . Epithelial cells, urine * 09/16/2015 occ   . Crystals, Ur, HPF, POC 09/16/2015 negative   . Casts, Ur, LPF, POC 09/16/2015  negative   . Yeast, UA 09/16/2015 many   . Color, UA 09/16/2015 yellow   . Clarity, UA 09/16/2015 clear   . Glucose, UA 09/16/2015 negative   . Bilirubin, UA 09/16/2015 negative   . Ketones, UA 09/16/2015 negative   . Spec Grav, UA 09/16/2015 1.010   . Blood, UA 09/16/2015 negative   . pH, UA 09/16/2015 7.5   . Protein, UA 09/16/2015 negative   . Urobilinogen, UA 09/16/2015 negative   . Nitrite, UA 09/16/2015 negative   . Leukocytes, UA 09/16/2015 Negative   Office Visit on 09/03/2015  Component Date Value  . Color, UA 09/03/2015 gold   . Clarity, UA 09/03/2015 cloudy   . Glucose, UA 09/03/2015 neg   . Bilirubin, UA 09/03/2015 neg   . Ketones, UA 09/03/2015 neg   . Spec Grav, UA 09/03/2015 1.010   . Blood, UA 09/03/2015 moderate   . pH, UA 09/03/2015 7.5   . Protein, UA 09/03/2015 trace   .  Urobilinogen, UA 09/03/2015 negative   . Nitrite, UA 09/03/2015 positive   . Leukocytes, UA 09/03/2015 large (3+)*  . WBC, Ur, HPF, POC 09/03/2015 tntc   . RBC, urine, microscopic 09/03/2015 occ   . Bacteria, U Microscopic 09/03/2015 many   . Mucus, UA 09/03/2015 neg   . Epithelial cells, urine * 09/03/2015 occ   . Crystals, Ur, HPF, POC 09/03/2015 neg   . Casts, Ur, LPF, POC 09/03/2015 neg   . Yeast, UA 09/03/2015 neg   . Urine Culture, Routine 09/03/2015 Final report*  . Urine Culture result 1 09/03/2015 Escherichia coli*  . ANTIMICROBIAL SUSCEPTIBI* 09/03/2015 Comment   Office Visit on 08/20/2015  Component Date Value  . PATH REPORT.SITE OF ORIG* 08/20/2015 Comment   . Marland Kitchen 08/20/2015 Comment   . PATH REPORT.RELEVANT HX * 08/20/2015 Comment   . PATH REPORT.FINAL DX SPEC 08/20/2015 Comment   . SIGNED OUT BY: 08/20/2015 Comment   . GROSS DESCRIPTION: 08/20/2015 Comment   . PAYMENT PROCEDURE 08/20/2015 Comment   . PATH REPORT.SITE OF ORIG* 08/20/2015 Comment   . Marland Kitchen 08/20/2015 Comment   . PATH REPORT.FINAL DX SPEC 08/20/2015 Comment   . SIGNED OUT BY: 08/20/2015 Comment   . GROSS DESCRIPTION: 08/20/2015 Comment   . PAYMENT PROCEDURE 08/20/2015 Comment   Office Visit on 08/08/2015  Component Date Value  . Hemoglobin A1C 08/08/2015 5.8   . Glucose 08/08/2015 106*  . BUN 08/08/2015 21   . Creatinine, Ser 08/08/2015 0.62   . GFR calc non Af Amer 08/08/2015 96   . GFR calc Af Amer 08/08/2015 110   . BUN/Creatinine Ratio 08/08/2015 34*  . Sodium 08/08/2015 141   . Potassium 08/08/2015 4.1   . Chloride 08/08/2015 98   . CO2 08/08/2015 24   . Calcium 08/08/2015 10.1   . Total Protein 08/08/2015 6.8   . Albumin 08/08/2015 4.9*  . Bilirubin Total 08/08/2015 0.4   . Bilirubin, Direct 08/08/2015 0.13   . Alkaline Phosphatase 08/08/2015 52   . AST 08/08/2015 39   . ALT 08/08/2015 46*  . Vit D, 25-Hydroxy 08/08/2015 39.5   . LDL Particle Number 08/08/2015 [id number]*  . LDL-C 08/08/2015 92    . HDL Cholesterol by NMR 08/08/2015 30*  . Triglycerides by NMR 08/08/2015 390*  . Cholesterol 08/08/2015 200*  . HDL Particle Number 08/08/2015 28.3*  . Small LDL Particle Number 08/08/2015 1758*  . LDL Size 08/08/2015 19.6   . LP-IR Score 08/08/2015 84*  . WBC 08/08/2015 6.8   .  RBC 08/08/2015 4.59   . Hemoglobin 08/08/2015 14.2   . Hematocrit 08/08/2015 42.1   . MCV 08/08/2015 92   . [facility] 08/08/2015 30.9   . MCHC 08/08/2015 33.7   . RDW 08/08/2015 14.4   . Platelets 08/08/2015 312   . Neutrophils 08/08/2015 43   . Lymphs 08/08/2015 50   . Monocytes 08/08/2015 5   . Eos 08/08/2015 1   . Basos 08/08/2015 1   . Neutrophils Absolute 08/08/2015 2.9   . Lymphocytes Absolute 08/08/2015 3.4*  . Monocytes Absolute 08/08/2015 0.3   . EOS (ABSOLUTE) 08/08/2015 0.1   . Basophils Absolute 08/08/2015 0.0   . Immature Granulocytes 08/08/2015 0   . Immature Grans (Abs) 08/08/2015 0.0       Assessment:    Hyperlipidemia with high risk ASCVD risk due to diabetes Target levels for LDL are: < 100 mg/dl (CHD or "CHD risk equivalent" is present)  Explained to the patient the respective contributions of genetics, diet, and exercise to lipid levels and the use of medication in severe cases which do not respond to lifestyle alteration. The patient's interest and motivation in making lifestyle changes seems good.     Plan:     1. Dietary changes: Reduce saturated fat, "trans" monounsaturated fatty acids, and cholesterol continue to limit sweets and other high CHO foods 2. Exercise changes:  increase exercise as able - goal is 150 minutes per week 3.  Patient does not qualify for PSK9 inhibitor at this time as she does not have clinical ASCVD, CAD, premature family history of CAD, or homozygous/heterozygous hyperlipidemia.  4.  Start rosuvastatin 5mg  take 1 tablet MWF to start will tryto increase to qd if tolerated.  Continue fenofibrate and zetia qd for now.  5.  Recheck lipids in 6 to 8  weeks. 6.  I also gave patient information about GLP-1 / Trulicity to look over.  I think this might be a good option for her for DM and weight loss.    Cherre Robins, PharmD, CPP

## 2016-02-04 NOTE — Patient Instructions (Signed)
  Increase non-starchy vegetables - carrots, green bean, squash, zucchini, tomatoes, onions, peppers, spinach and other green leafy vegetables, cabbage, lettuce, cucumbers, asparagus, okra (not fried), eggplant limit sugar and processed foods (cakes, cookies, ice cream, crackers and chips) Increase fresh fruit but limit serving sizes 1/2 cup or about the size of tennis or baseball limit red meat to no more than 1-2 times per week (serving size about the size of your palm) Choose whole grains / lean proteins - whole wheat bread, quinoa, whole grain rice (1/2 cup), fish, chicken, [place]  Try rosuvastatin 5mg  - take 1 tablet on mondays, Wednesday to Fridays to start.

## 2016-02-19 ENCOUNTER — Ambulatory Visit (INDEPENDENT_AMBULATORY_CARE_PROVIDER_SITE_OTHER): Payer: 59 | Admitting: Pediatrics

## 2016-02-19 ENCOUNTER — Encounter: Payer: Self-pay | Admitting: Pediatrics

## 2016-02-19 VITALS — BP 137/84 | HR 79 | Temp 98.4°F | Ht 60.0 in | Wt 191.6 lb

## 2016-02-19 DIAGNOSIS — Z01419 Encounter for gynecological examination (general) (routine) without abnormal findings: Secondary | ICD-10-CM

## 2016-02-19 DIAGNOSIS — N952 Postmenopausal atrophic vaginitis: Secondary | ICD-10-CM

## 2016-02-19 MED ORDER — ESTRADIOL 10 MCG VA TABS: ORAL_TABLET | VAGINAL | Status: DC

## 2016-02-19 NOTE — Progress Notes (Signed)
Subjective:    Patient ID: Melissa Bowman, female    DOB: 1951-05-07, 65 y.o.   MRN: [id number]  CC: Gynecologic Exam   HPI: Melissa Bowman is a 65 y.o. female presenting for Gynecologic Exam  Complete hysterectomy in 1994 for fibroid tumors.  Feeling well No h/o abnormal pap smears She does have some vaginal itching and irritation at times. Was tried on vaginal estrogen cream, helped some but had a hard time remembering to use it because was hard to use   Depression screen [facility] 2/9 02/19/2016 01/21/2016 08/08/2015 03/18/2015 12/18/2014  Decreased Interest 0 0 0 0 0  Down, Depressed, Hopeless 0 0 0 0 0  PHQ - 2 Score 0 0 0 0 0     Relevant past medical, surgical, family and social history reviewed and updated as indicated. Interim medical history since our last visit reviewed. Allergies and medications reviewed and updated.    ROS: Per HPI unless specifically indicated above  History  Smoking status  . Never Smoker   Smokeless tobacco  . Never Used    Past Medical History Patient Active Problem List   Diagnosis Date Noted  . Osteopenia 12/26/2013  . Type 2 diabetes mellitus (HCC) 10/31/2013  . Hypertension 05/14/2013  . Hyperlipidemia 05/14/2013  . Depression 05/14/2013  . Allergic rhinitis 05/14/2013  . Vitamin D deficiency 05/14/2013  . Hemorrhage of rectum and anus 09/15/2011  . Dysphagia, pharyngoesophageal phase 09/15/2011  . NAFLD (nonalcoholic fatty liver disease) 84/69/6295  . Chronic RUQ pain 09/15/2011  . Diverticulosis of colon (without mention of hemorrhage) 09/15/2011  . BRBPR (bright red blood per rectum) 08/27/2011  . Internal hemorrhoids without mention of complication 08/27/2011  . Esophageal reflux 08/27/2011  . Abdominal pain 08/27/2011  . Esophageal dysphagia 08/27/2011  . Obesity 08/27/2011  . Anxiety and depression 08/27/2011  . Central sleep apnea due to medical condition 08/27/2011        Objective:    BP 137/84 mmHg  Pulse 79   Temp(Src) 98.4 F (36.9 C) (Oral)  Ht 5' (1.524 m)  Wt 191 lb 9.6 oz (86.909 kg)  BMI 37.42 kg/m2  Wt Readings from Last 3 Encounters:  02/19/16 191 lb 9.6 oz (86.909 kg)  02/04/16 192 lb (87.091 kg)  01/21/16 190 lb (86.183 kg)    Gen: NAD, alert, cooperative with exam, NCAT EYES: no scleral injection or icterus ENT:   OP without erythema CV: NRRR, normal S1/S2, no murmur, distal pulses 2+ b/l Resp: CTABL, no wheezes, normal WOB Abd: +BS, soft, NTND. no guarding or organomegaly Ext: No edema, warm Neuro: Alert and oriented GU: normal ext female genitalia, pale vaginal mucosa, minimal amount of white thin mucus in vaginal vault, no cervix identified     Assessment & Plan:    Mira was seen today for gynecologic exam. No cervix identified on exam, pt s/p TAH, also now 64yo. If pap smear is negative can stop cervical cancer screening.   Diagnoses and all orders for this visit:  Encounter for routine gynecological examination -     Pap IG and HPV (high risk) DNA detection  Vaginal atrophy -     Estradiol 10 MCG TABS vaginal tablet; 10 mcg tab daily for two weeks, then twice a week    Follow up plan: 3 mo as scheduled for med f/u with PCP  Rex Kras, MD [place] [facility] 02/19/2016, 11:16 AM

## 2016-02-23 LAB — PAP IG AND HPV HIGH-RISK
HPV, high-risk: NEGATIVE
PAP Smear Comment: 0

## 2016-02-27 ENCOUNTER — Telehealth: Payer: Self-pay | Admitting: Pharmacist

## 2016-02-27 NOTE — Telephone Encounter (Signed)
-----   Message from [place], [place] sent at 02/04/2016  5:30 PM EST ----- Called to check on tolerance of generic crestor and if wants to try Trulicity / GLP

## 2016-02-27 NOTE — Telephone Encounter (Signed)
Spoke with patient - she took rosuvastatin 5mg  qd for 1 week but started to have myalgias and was nauseated - decreased to qod and continued to have myalgias.  She has stopped rosuvastatin.  She is still taking fenofibrate.  Would like to consider Zetia but patient has a deductible and cost is an issue (generic rosuvastatin was $180).   Will also hold off on Trulicity due to cost.

## 2016-03-30 ENCOUNTER — Other Ambulatory Visit: Payer: Self-pay | Admitting: Family Medicine

## 2016-04-01 ENCOUNTER — Other Ambulatory Visit: Payer: Self-pay | Admitting: Family Medicine

## 2016-04-02 IMAGING — US US RENAL
1 series · 14 of 25 positions shown · non-contrast
Comparison: 09/26/2012

CLINICAL DATA: Chronic cystitis.

EXAM:
RENAL/URINARY TRACT ULTRASOUND COMPLETE

[Series 1: us renal · 0.23mm/px · 14 of 55 slices shown]
[im 1/55]
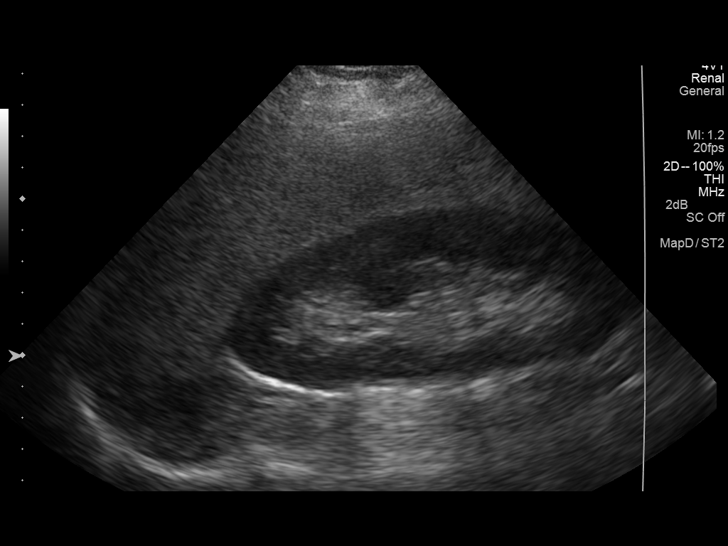
[im 5/55]
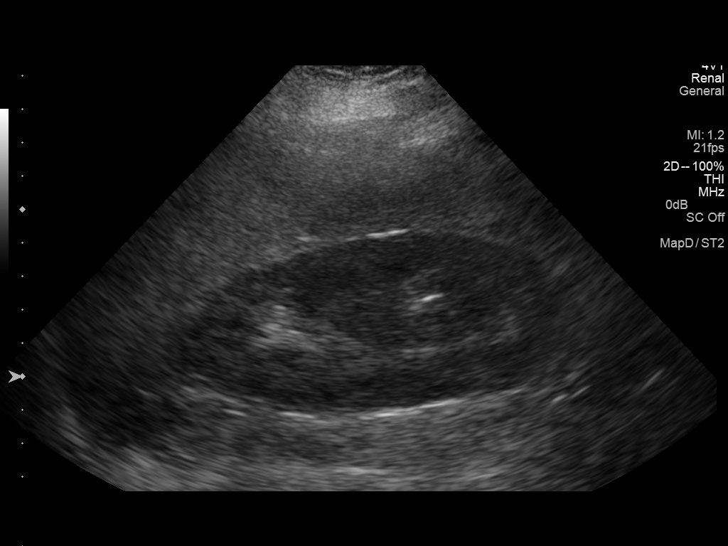
[im 10/55]
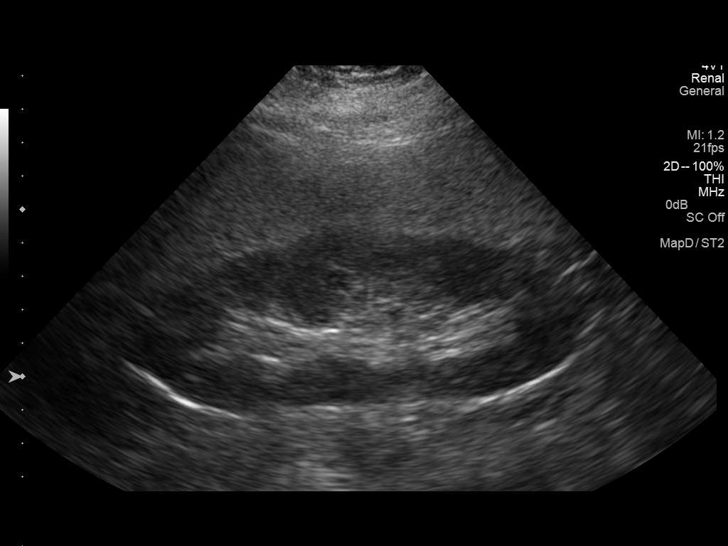
[im 14/55]
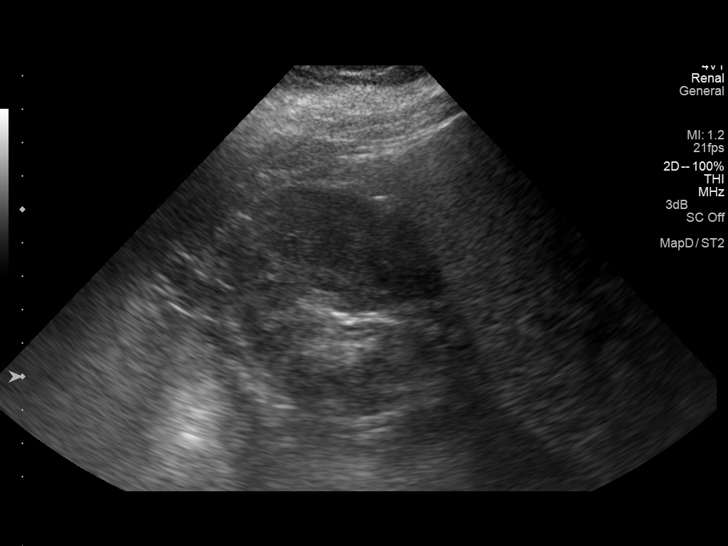
[im 19/55]
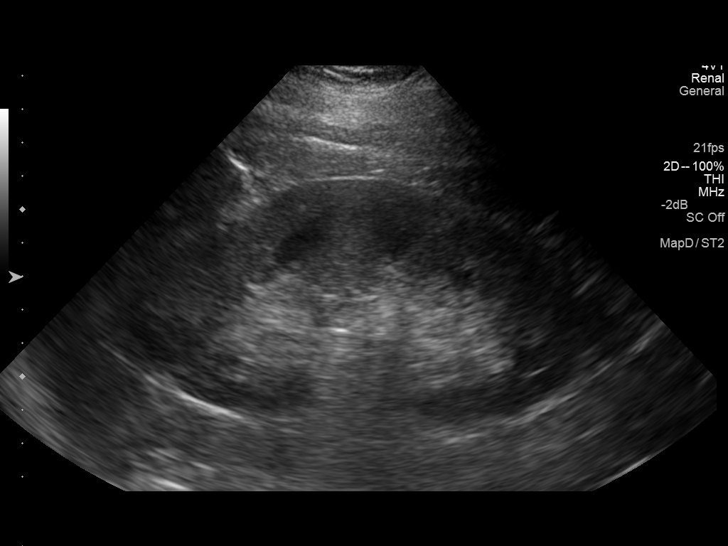
[im 21/55]
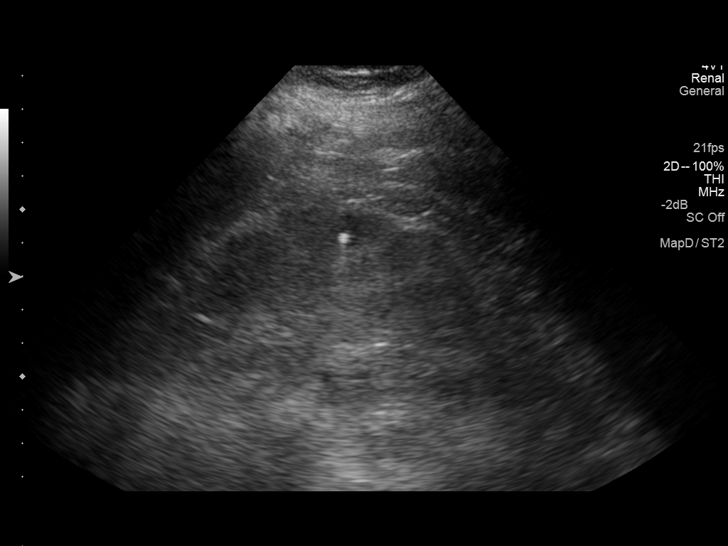
[im 25/55]
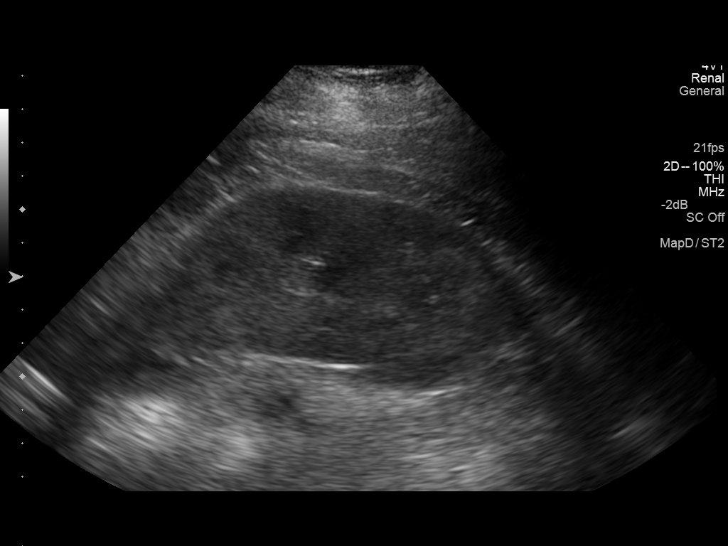
[im 30/55]
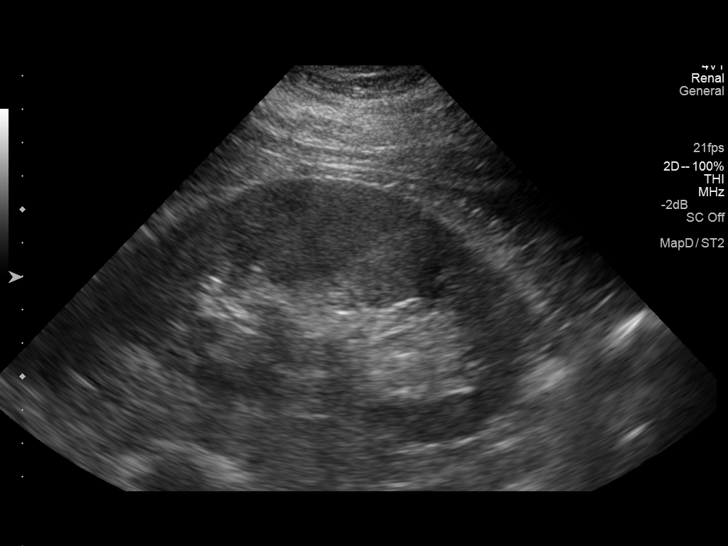
[im 34/55]
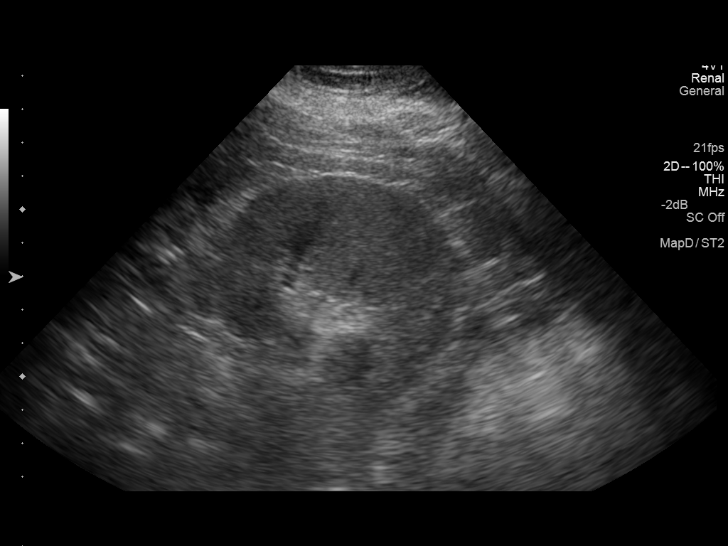
[im 37/55]
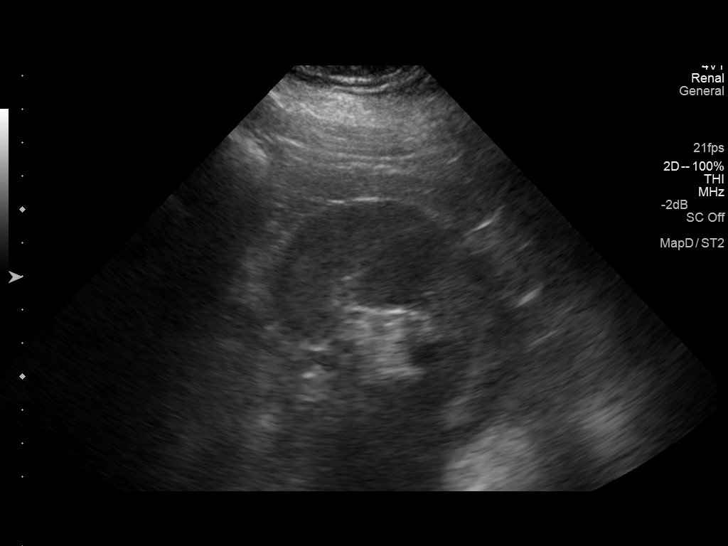
[im 41/55]
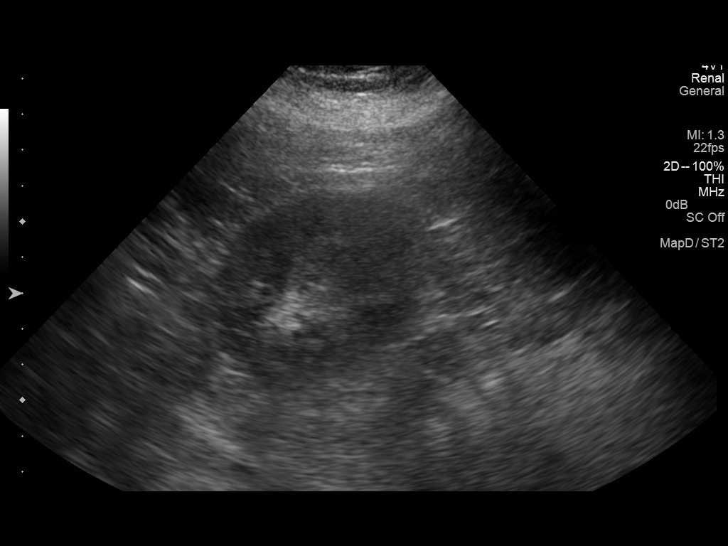
[im 46/55]
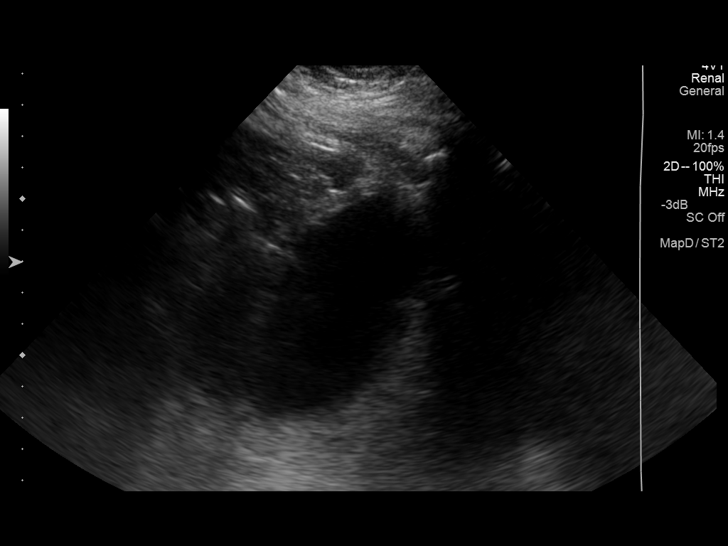
[im 50/55]
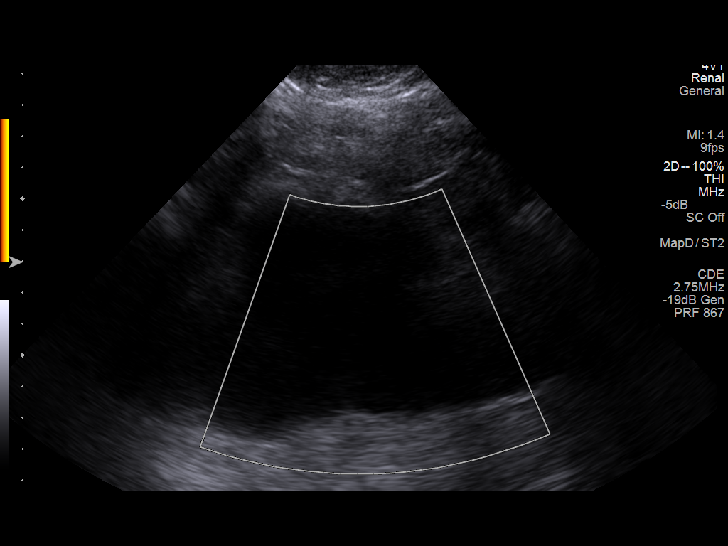
[im 55/55]
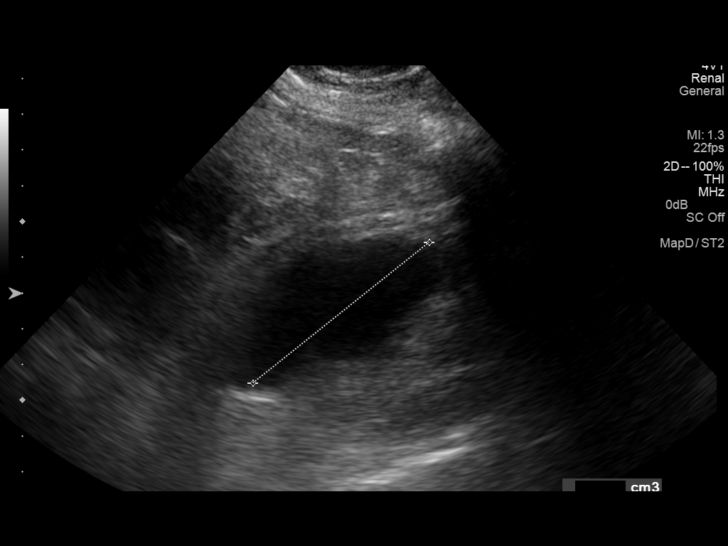

[14 of 25 positions shown; findings below may reference images not displayed]

FINDINGS: Right Kidney:

Length: 14.3 cm. Calcification within the midpole of the right
kidney is noted measuring 5 mm. Echogenicity within normal limits.
No mass or hydronephrosis visualized.

Left Kidney:

Length: 13.8 cm. Inferior pole cyst measures 9 mm. There is a 3 mm
stone within the mid to inferior pole collecting system.
Echogenicity within normal limits. No mass or hydronephrosis
visualized.

Bladder:

Appears normal for degree of bladder distention. The ureteral jets
are not visualized. The prevoid bladder volume is equal to 111 cc.
The postvoid bladder volume is equal to 67 cc.
IMPRESSION: 1. No evidence for hydronephrosis.
2. Suspect small bilateral renal stones.
3. Left renal cyst.

## 2016-04-05 IMAGING — CR DG HIP (WITH OR WITHOUT PELVIS) 2-3V*L*
2 series · 2 of 2 positions shown · non-contrast
Comparison: None.

CLINICAL DATA: Left hip pain

EXAM:
LEFT HIP - COMPLETE 2+ VIEW

[view not recorded (1 of 2)]
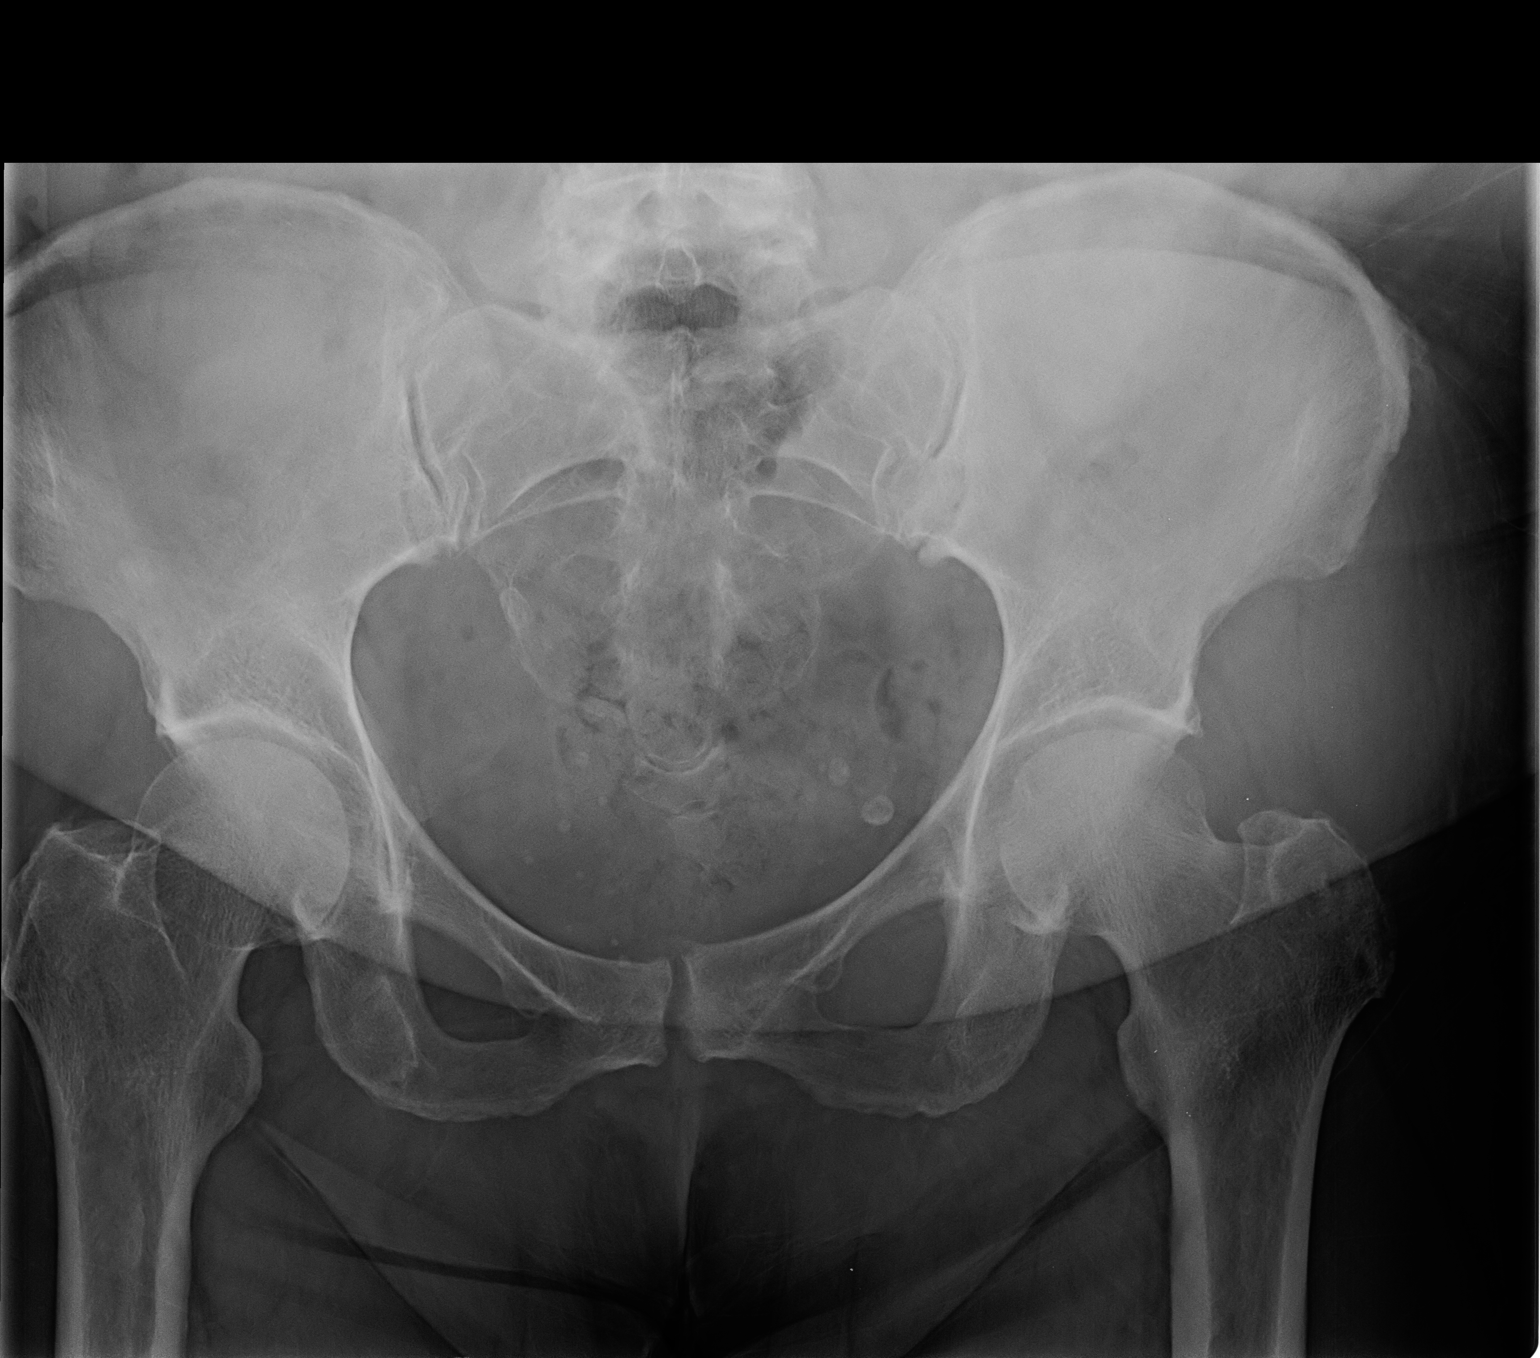

[view not recorded (2 of 2)]
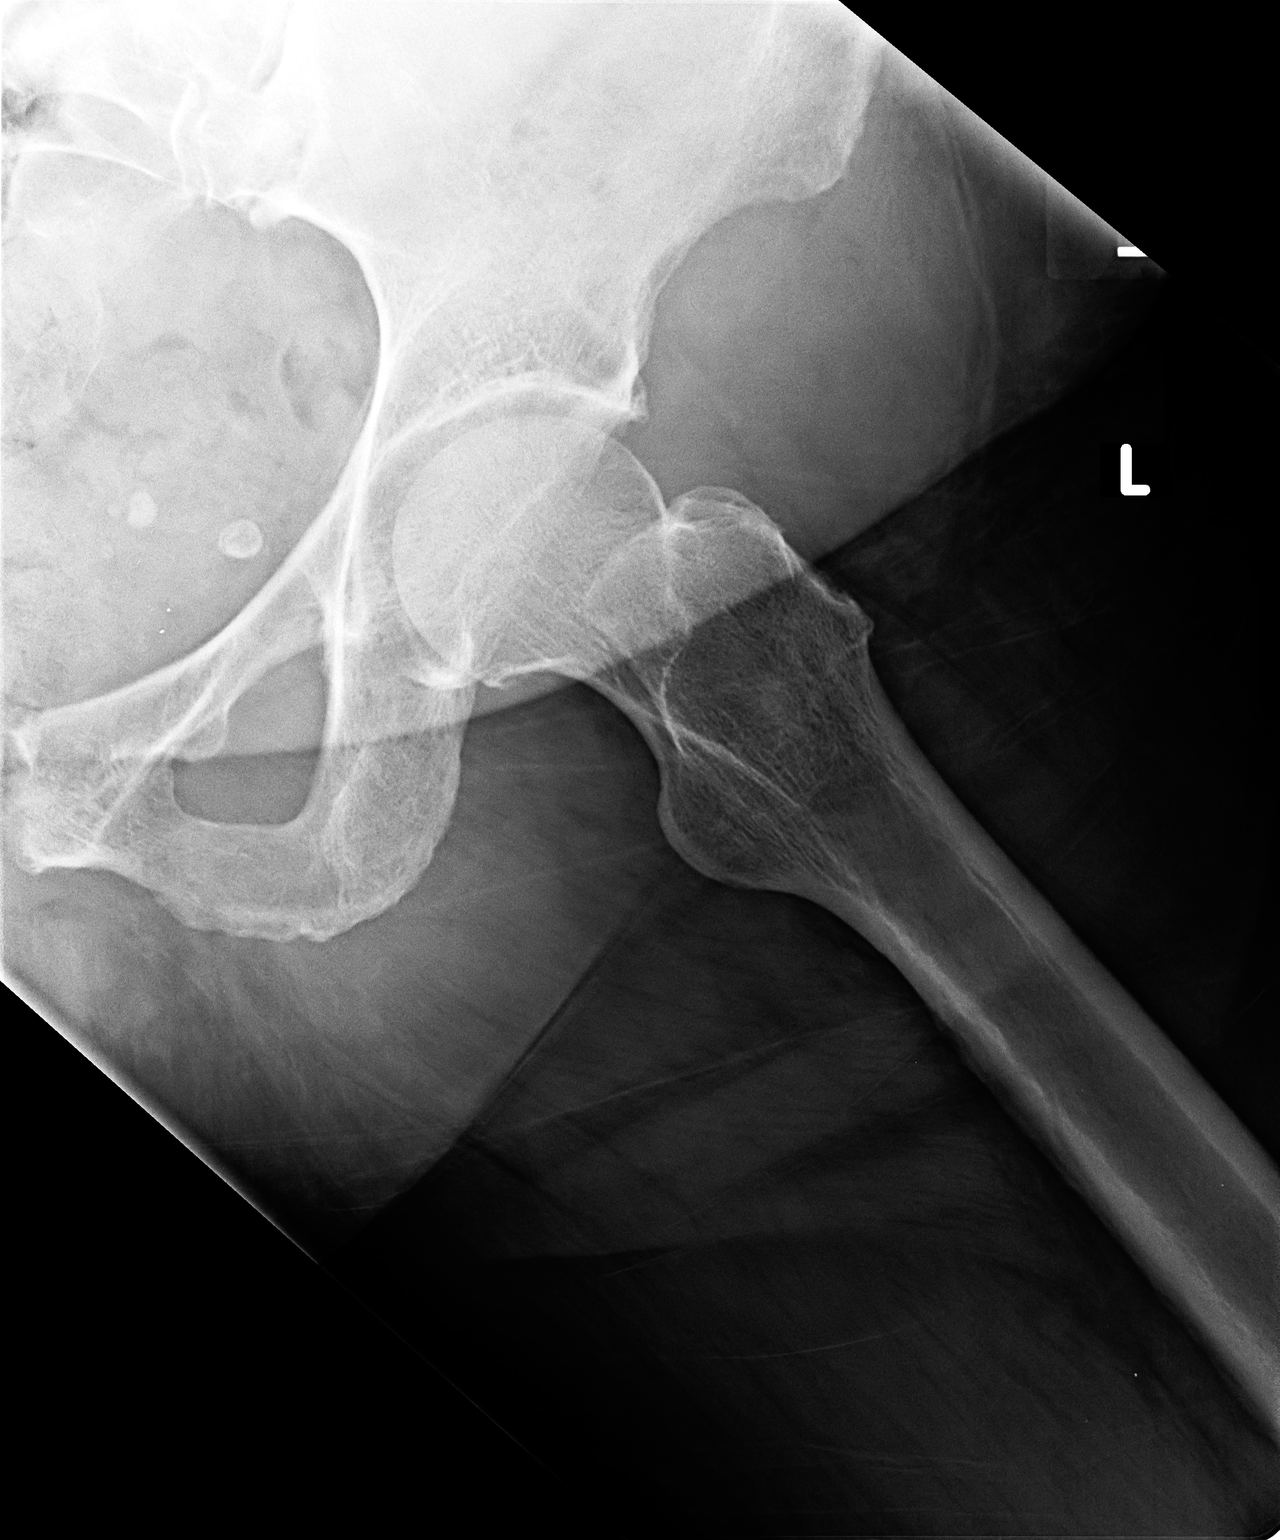

[2 of 2 positions shown; findings below may reference images not displayed]

FINDINGS: There is no evidence of hip fracture or dislocation. There is
minimal osteoarthritic change of the left hip. There is no lytic or
sclerotic osseous lesion.
IMPRESSION: No acute osseous injury of the left hip. Minimal osteoarthritis of
the left hip.

## 2016-04-08 ENCOUNTER — Other Ambulatory Visit: Payer: Self-pay | Admitting: Family Medicine

## 2016-04-14 ENCOUNTER — Other Ambulatory Visit (HOSPITAL_COMMUNITY): Payer: Self-pay | Admitting: Respiratory Therapy

## 2016-04-17 ENCOUNTER — Ambulatory Visit (INDEPENDENT_AMBULATORY_CARE_PROVIDER_SITE_OTHER): Payer: 59 | Admitting: Family Medicine

## 2016-04-17 VITALS — BP 169/91 | HR 84 | Temp 97.9°F | Ht 60.0 in | Wt 195.6 lb

## 2016-04-17 DIAGNOSIS — R109 Unspecified abdominal pain: Secondary | ICD-10-CM | POA: Diagnosis not present

## 2016-04-17 DIAGNOSIS — M62838 Other muscle spasm: Secondary | ICD-10-CM

## 2016-04-17 DIAGNOSIS — R10A0 Flank pain, unspecified side: Secondary | ICD-10-CM

## 2016-04-17 MED ORDER — DICLOFENAC SODIUM 75 MG PO TBEC: 75.0000 mg | DELAYED_RELEASE_TABLET | Freq: Two times a day (BID) | ORAL | Status: DC

## 2016-04-17 MED ORDER — CYCLOBENZAPRINE HCL 5 MG PO TABS: 5.0000 mg | ORAL_TABLET | Freq: Three times a day (TID) | ORAL | Status: DC | PRN

## 2016-04-17 MED ORDER — KETOROLAC TROMETHAMINE 60 MG/2ML IM SOLN
60.0000 mg | Freq: Once | INTRAMUSCULAR | Status: AC
  Administered 2016-04-17: 60 mg via INTRAMUSCULAR

## 2016-04-17 NOTE — Patient Instructions (Addendum)
Great to see you!  Take voltaren twice daily for 7 to 10 days You can take flexeril as well (for muscle sapsms) but it may make you sleepy.   I do not think this is a kidney stone but it is smart to urinate through  The strainer for a  Few days regardless  Muscle Cramps and Spasms Muscle cramps and spasms are when muscles tighten by themselves. They usually get better within minutes. Muscle cramps are painful. They are usually stronger and last longer than muscle spasms. Muscle spasms may or may not be painful. They can last a few seconds or much longer. HOME CARE  Drink enough fluid to keep your pee (urine) clear or pale yellow.  Massage, stretch, and relax the muscle.  Use a warm towel, heating pad, or warm shower water on tight muscles.  Place ice on the muscle if it is tender or in pain.  Put ice in a plastic bag.  Place a towel between your skin and the bag.  Leave the ice on for 15-20 minutes, 03-04 times a day.  Only take medicine as told by your doctor. GET HELP RIGHT AWAY IF:  Your cramps or spasms get worse, happen more often, or do not get better with time. MAKE SURE YOU:  Understand these instructions.  Will watch your condition.  Will get help right away if you are not doing well or get worse.   This information is not intended to replace advice given to you by your health care provider. Make sure you discuss any questions you have with your health care provider.   Document Released: 11/11/2008 Document Revised: 03/26/2013 Document Reviewed: 11/15/2012 Elsevier Interactive Patient Education [employer].

## 2016-04-17 NOTE — Progress Notes (Signed)
   HPI  Patient presents today with concern for kidney stones.  Patient expressed she's developed right-sided flank pain about 1-1/2 days ago She has persistent right-sided flank pain, yesterday while sitting down it hurt her for about or hour straight. It has no radiation Is located in her right flank where her usual kidney stone pain starts, however it is not radiating to her groin.  She has not noticed any blood in her urine.  She has not had any fevers, chills  Otherwise she feels well.  There is no discrete injury, however she's preparing for a family reunion and has been on her feet several hours a day recently.  PMH: Smoking status noted ROS: Per HPI  Objective: BP 169/91 mmHg  Pulse 84  Temp(Src) 97.9 F (36.6 C) (Oral)  Ht 5' (1.524 m)  Wt 195 lb 9.6 oz (88.724 kg)  BMI 38.20 kg/m2  SpO2 97% Gen: NAD, alert, cooperative with exam HEENT: NCAT CV: RRR, good S1/S2, no murmur Resp: CTABL, no wheezes, non-labored Ext: No edema, warm Neuro: Alert and oriented, No gross deficits Musculoskeletal: Tenderness to palpation of the right CVA area, also tenderness to palpation of her right-sided lumbar spine paraspinal muscles, no lumbar spine tenderness  Assessment and plan:  # Muscle spasm I think this is most likely muscle spasm given that her urinalysis is negative for blood in her recent CAT scan was negative for stones. Recommended urination through the strainer just to be sure Given a shot of Toradol, scheduled Voltaren 7-10 days Flexeril, cautioned given about drowsiness Heat and supportive care discussed Return to clinic with any worsening symptoms or does not Improves expected. Note that her daughter is an Therapist, sports in our clinic      Orders Placed This Encounter  Procedures  . Urinalysis    Meds ordered this encounter  Medications  . diclofenac (VOLTAREN) 75 MG EC tablet    Sig: Take 1 tablet (75 mg total) by mouth 2 (two) times daily.    Dispense:  20  tablet    Refill:  0  . cyclobenzaprine (FLEXERIL) 5 MG tablet    Sig: Take 1 tablet (5 mg total) by mouth 3 (three) times daily as needed for muscle spasms.    Dispense:  30 tablet    Refill:  Barrera, MD [facility] 04/17/2016, 11:48 AM

## 2016-04-17 NOTE — Addendum Note (Signed)
Addended by: Marin Olp on: 04/17/2016 12:02 PM   Modules accepted: Orders

## 2016-04-19 LAB — URINALYSIS
BILIRUBIN UA: NEGATIVE
GLUCOSE, UA: NEGATIVE
Ketones, UA: NEGATIVE
Leukocytes, UA: NEGATIVE
NITRITE UA: NEGATIVE
PH UA: 8.5 — AB (ref 5.0–7.5)
Protein, UA: NEGATIVE
RBC, UA: NEGATIVE
Specific Gravity, UA: 1.02 (ref 1.005–1.030)
UUROB: 0.2 mg/dL (ref 0.2–1.0)

## 2016-04-23 ENCOUNTER — Other Ambulatory Visit: Payer: Self-pay | Admitting: Family Medicine

## 2016-04-24 ENCOUNTER — Other Ambulatory Visit: Payer: Self-pay | Admitting: Family Medicine

## 2016-04-29 ENCOUNTER — Other Ambulatory Visit: Payer: Self-pay | Admitting: *Deleted

## 2016-04-29 DIAGNOSIS — G4733 Obstructive sleep apnea (adult) (pediatric): Secondary | ICD-10-CM

## 2016-04-29 MED ORDER — GLUCOSE BLOOD VI STRP: ORAL_STRIP | Status: DC

## 2016-04-29 MED ORDER — METFORMIN HCL 1000 MG PO TABS: ORAL_TABLET | ORAL | Status: DC

## 2016-05-11 ENCOUNTER — Telehealth: Payer: Self-pay | Admitting: *Deleted

## 2016-05-11 MED ORDER — CIPROFLOXACIN HCL 500 MG PO TABS: 500.0000 mg | ORAL_TABLET | Freq: Two times a day (BID) | ORAL | Status: DC

## 2016-05-11 NOTE — Telephone Encounter (Signed)
Pt daughter, Cyril Mourning - calls  Mom has another UTI and needs something called in if possible She still does not have ant insurance

## 2016-05-11 NOTE — Telephone Encounter (Signed)
Per Dr Sabra Heck - we can call in what she was treated with after the last culture.  Cipro ordered and daughter aware

## 2016-05-28 ENCOUNTER — Ambulatory Visit: Payer: 59 | Admitting: Family Medicine

## 2016-06-27 ENCOUNTER — Other Ambulatory Visit: Payer: Self-pay | Admitting: Family Medicine

## 2016-09-14 ENCOUNTER — Other Ambulatory Visit: Payer: Self-pay | Admitting: Family Medicine

## 2016-09-17 ENCOUNTER — Other Ambulatory Visit: Payer: Self-pay | Admitting: Family Medicine

## 2016-09-27 ENCOUNTER — Other Ambulatory Visit: Payer: Self-pay | Admitting: Family Medicine

## 2016-10-05 ENCOUNTER — Other Ambulatory Visit: Payer: Self-pay | Admitting: Family Medicine

## 2016-11-01 ENCOUNTER — Encounter: Payer: 59 | Admitting: *Deleted

## 2016-11-03 ENCOUNTER — Ambulatory Visit (INDEPENDENT_AMBULATORY_CARE_PROVIDER_SITE_OTHER): Payer: 59

## 2016-11-03 DIAGNOSIS — Z23 Encounter for immunization: Secondary | ICD-10-CM

## 2016-12-04 ENCOUNTER — Other Ambulatory Visit: Payer: Self-pay | Admitting: Family Medicine

## 2016-12-09 ENCOUNTER — Other Ambulatory Visit: Payer: Self-pay | Admitting: Family Medicine

## 2016-12-15 ENCOUNTER — Other Ambulatory Visit: Payer: Self-pay | Admitting: *Deleted

## 2016-12-15 MED ORDER — AMITRIPTYLINE HCL 10 MG PO TABS: 10.0000 mg | ORAL_TABLET | Freq: Every day | ORAL | 0 refills | Status: DC

## 2016-12-25 ENCOUNTER — Other Ambulatory Visit: Payer: Self-pay | Admitting: Family Medicine

## 2016-12-28 ENCOUNTER — Ambulatory Visit (INDEPENDENT_AMBULATORY_CARE_PROVIDER_SITE_OTHER): Payer: Medicare Other | Admitting: Family Medicine

## 2016-12-28 ENCOUNTER — Encounter: Payer: Self-pay | Admitting: Family Medicine

## 2016-12-28 VITALS — BP 132/74 | HR 87 | Temp 96.9°F | Ht 60.0 in | Wt 198.0 lb

## 2016-12-28 DIAGNOSIS — E559 Vitamin D deficiency, unspecified: Secondary | ICD-10-CM

## 2016-12-28 DIAGNOSIS — E1169 Type 2 diabetes mellitus with other specified complication: Secondary | ICD-10-CM

## 2016-12-28 DIAGNOSIS — E785 Hyperlipidemia, unspecified: Secondary | ICD-10-CM | POA: Diagnosis not present

## 2016-12-28 DIAGNOSIS — E119 Type 2 diabetes mellitus without complications: Secondary | ICD-10-CM | POA: Diagnosis not present

## 2016-12-28 DIAGNOSIS — K219 Gastro-esophageal reflux disease without esophagitis: Secondary | ICD-10-CM | POA: Diagnosis not present

## 2016-12-28 DIAGNOSIS — I10 Essential (primary) hypertension: Secondary | ICD-10-CM

## 2016-12-28 LAB — BAYER DCA HB A1C WAIVED: HB A1C (BAYER DCA - WAIVED): 6 % (ref <7.0)

## 2016-12-28 NOTE — Patient Instructions (Addendum)
Medicare Annual Wellness Visit  [place] and the medical providers at [facility] strive to bring you the best medical care.  In doing so we not only want to address your current medical conditions and concerns but also to detect new conditions early and prevent illness, disease and health-related problems.    Medicare offers a yearly Wellness Visit which allows our clinical staff to assess your need for preventative services including immunizations, lifestyle education, counseling to decrease risk of preventable diseases and screening for fall risk and other medical concerns.    This visit is provided free of charge (no copay) for all Medicare recipients. The clinical pharmacists at [facility] have begun to conduct these Wellness Visits which will also include a thorough review of all your medications.    As you primary medical provider recommend that you make an appointment for your Annual Wellness Visit if you have not done so already this year.  You may set up this appointment before you leave today or you may call back [id number]) and schedule an appointment.  Please make sure when you call that you mention that you are scheduling your Annual Wellness Visit with the clinical pharmacist so that the appointment may be made for the proper length of time.     Continue current medications. Continue good therapeutic lifestyle changes which include good diet and exercise. Fall precautions discussed with patient. If an FOBT was given today- please return it to our front desk. If you are over 43 years old - you may need Prevnar 38 or the adult Pneumonia vaccine.  **Flu shots are available--- please call and schedule a FLU-CLINIC appointment**  After your visit with [place] today you will receive a survey in the mail or online from [employer] regarding your care with [place]. Please take a moment to fill this out. Your feedback is very  important to [place] as you can help [place] better understand your patient needs as well as improve your experience and satisfaction. WE CARE ABOUT YOU!!!   We will call with lab work results as soon as they become available Do not forget to get your eye exam Stay active physically in for the rest of the winter drink plenty of fluids and stay well hydrated and keep the house as cool as possible

## 2016-12-28 NOTE — Progress Notes (Signed)
Subjective:    Patient ID: Melissa Bowman, female    DOB: 08/23/1951, 66 y.o.   MRN: [id number]  HPI Pt here for follow up and management of chronic medical problems which includes hyperlipidemia, hypertension, and diabetes. She is taking medications regularly.The patient has no complaints and is requesting the refills. 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 The patient is due to get an eye exam and she can do that at this time and we'll schedule that visit as soon as possible. She denies any chest pain or shortness of breath. She denies any problems with her intestinal tract including nausea vomiting diarrhea blood in the stool or black tarry bowel movements. She is not certain when she needs to get her next colonoscopy if it was 5 years or 10 years there is no family history of colon cancer. She is passing her water without problems. She says that her blood sugars at home have been running in the 102-112 range fasting. Her mammogram will not be due until the summertime.    Patient Active Problem List   Diagnosis Date Noted  . Osteopenia 12/26/2013  . Type 2 diabetes mellitus (HCC) 10/31/2013  . Hypertension 05/14/2013  . Hyperlipidemia 05/14/2013  . Depression 05/14/2013  . Allergic rhinitis 05/14/2013  . Vitamin D deficiency 05/14/2013  . Hemorrhage of rectum and anus 09/15/2011  . Dysphagia, pharyngoesophageal phase 09/15/2011  . NAFLD (nonalcoholic fatty liver disease) 40/98/1191  . Chronic RUQ pain 09/15/2011  . Diverticulosis of colon (without mention of hemorrhage) 09/15/2011  . BRBPR (bright red blood per rectum) 08/27/2011  . Internal hemorrhoids without mention of complication 08/27/2011  . Esophageal reflux 08/27/2011  . Abdominal pain 08/27/2011  . Esophageal dysphagia 08/27/2011  . Obesity 08/27/2011  . Anxiety and depression 08/27/2011  . 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 albuterol (PROVENTIL, VENTOLIN) (5 MG/ML) 0.5% NEBU Take by nebulization continuous.    Marland Kitchen amitriptyline (ELAVIL) 10 MG tablet Take 1 tablet (10 mg total) by mouth at bedtime.  Marland Kitchen amLODipine (NORVASC) 10 MG tablet TAKE 1 TABLET BY MOUTH  DAILY FOR BLOOD PRESSURE AS DIRECTED  . aspirin EC 81 MG tablet Take 1 tablet (81 mg total) by mouth daily.  . Calcium 1200-1000 MG-UNIT CHEW Chew 1 tablet by mouth daily.   . Cholecalciferol (VITAMIN D-3) 5000 UNITS TABS Take by mouth daily.    . diclofenac (VOLTAREN) 75 MG EC tablet Take 1 tablet (75 mg total) by mouth 2 (two) times daily.  . fenofibrate 160 MG tablet TAKE 1 TABLET BY MOUTH  DAILY  . fluticasone (FLONASE) 50 MCG/ACT nasal spray Use 2 sprays in each  nostril daily  . glucose blood (ONE TOUCH ULTRA TEST) test strip TEST BLOOD SUGAR 3 TIMES A DAY  . losartan-hydrochlorothiazide (HYZAAR) 100-25 MG tablet TAKE 1 TABLET BY MOUTH  EVERY DAY AS DIRECTED  . metFORMIN (GLUCOPHAGE) 1000 MG tablet Take 1 tablet by mouth two  times daily with a meal  . montelukast (SINGULAIR) 10 MG tablet TAKE 1 TABLET BY MOUTH  DAILY  . omeprazole (PRILOSEC) 40 MG capsule TAKE 1 CAPSULE BY MOUTH  DAILY  . PROAIR HFA 108 (90 BASE) MCG/ACT inhaler Use 2 puffs into the lungs  every 6 hours as needed for wheezing or shortness of  breath  . raloxifene (EVISTA) 60 MG tablet  TAKE 1 TABLET BY MOUTH  DAILY  . SYMBICORT 160-4.5 MCG/ACT inhaler Use 2 puffs two times daily  . venlafaxine (EFFEXOR) 75 MG tablet TAKE 1 TABLET BY MOUTH 3  TIMES A DAY  . [DISCONTINUED] ciprofloxacin (CIPRO) 500 MG tablet Take 1 tablet (500 mg total) by mouth 2 (two) times daily.  . [DISCONTINUED] cyclobenzaprine (FLEXERIL) 5 MG tablet Take 1 tablet (5 mg total) by mouth 3 (three) times daily as needed for muscle spasms.  . [DISCONTINUED] Estradiol 10 MCG TABS vaginal tablet 10 mcg tab daily for two weeks, then twice a week  .  [DISCONTINUED] rosuvastatin (CRESTOR) 5 MG tablet Take 1 tablet (5 mg total) by mouth daily.   No facility-administered encounter medications on file as of 12/28/2016.      Review of Systems  Constitutional: Negative.   HENT: Negative.   Eyes: Negative.   Respiratory: Negative.   Cardiovascular: Negative.   Gastrointestinal: Negative.   Endocrine: Negative.   Genitourinary: Negative.   Musculoskeletal: Negative.   Skin: Negative.   Allergic/Immunologic: Negative.   Neurological: Negative.   Hematological: Negative.   Psychiatric/Behavioral: Negative.        Objective:   Physical Exam  Constitutional: She is oriented to person, place, and time. She appears well-developed and well-nourished. No distress.  Pleasant and alert and positive  HENT:  Head: Normocephalic and atraumatic.  Right Ear: External ear normal.  Left Ear: External ear normal.  Nose: Nose normal.  Mouth/Throat: Oropharynx is clear and moist.  Eyes: Conjunctivae and EOM are normal. Pupils are equal, round, and reactive to light. Right eye exhibits no discharge. Left eye exhibits no discharge. No scleral icterus.  Neck: Normal range of motion. Neck supple. No thyromegaly present.  No bruits thyromegaly or anterior cervical adenopathy  Cardiovascular: Normal rate, regular rhythm, normal heart sounds and intact distal pulses.   No murmur heard. The heart has a regular rate and rhythm at 84/m  Pulmonary/Chest: Effort normal and breath sounds normal. No respiratory distress. She has no wheezes. She has no rales.  Clear anteriorly and posteriorly  Abdominal: Soft. Bowel sounds are normal. She exhibits no mass. There is no tenderness. There is no rebound and no guarding.  No liver or spleen enlargement. No epigastric tenderness. No masses. No bruits.  Musculoskeletal: Normal range of motion. She exhibits no edema.  Lymphadenopathy:    She has no cervical adenopathy.  Neurological: She is alert and oriented to  person, place, and time. She has normal reflexes. No cranial nerve deficit.  Skin: Skin is warm and dry. No rash noted.  Psychiatric: She has a normal mood and affect. Her behavior is normal. Judgment and thought content normal.  Nursing note and vitals reviewed.  BP 132/74 (BP Location: Left Arm)   Pulse 87   Temp (!) 96.9 F (36.1 C) (Oral)   Ht 5' (1.524 m)   Wt 198 lb (89.8 kg)   BMI 38.67 kg/m         Assessment & Plan:  1. Hyperlipidemia associated with type 2 diabetes mellitus (HCC) -Continue current treatment and aggressive therapeutic lifestyle changes  2. Type 2 diabetes mellitus without complication, without long-term current use of insulin (HCC) -Continue current treatment and check blood sugars periodically and get eye exam as planned - Bayer DCA Hb A1c Waived  3. Essential hypertension -The blood pressure is good today and she will continue current treatment - CMP14+EGFR  4. Vitamin D deficiency -Continue current treatment  5. Gastroesophageal reflux disease,  esophagitis presence not specified -The patient is having no symptoms with this and she will continue with her current treatment measures.  Patient Instructions                       Medicare Annual Wellness Visit  [place] and the medical providers at [facility] strive to bring you the best medical care.  In doing so we not only want to address your current medical conditions and concerns but also to detect new conditions early and prevent illness, disease and health-related problems.    Medicare offers a yearly Wellness Visit which allows our clinical staff to assess your need for preventative services including immunizations, lifestyle education, counseling to decrease risk of preventable diseases and screening for fall risk and other medical concerns.    This visit is provided free of charge (no copay) for all Medicare recipients. The clinical pharmacists at [facility] have begun to conduct these Wellness Visits which will also include a thorough review of all your medications.    As you primary medical provider recommend that you make an appointment for your Annual Wellness Visit if you have not done so already this year.  You may set up this appointment before you leave today or you may call back ([phone]) and schedule an appointment.  Please make sure when you call that you mention that you are scheduling your Annual Wellness Visit with the clinical pharmacist so that the appointment may be made for the proper length of time.     Continue current medications. Continue good therapeutic lifestyle changes which include good diet and exercise. Fall precautions discussed with patient. If an FOBT was given today- please return it to our front desk. If you are over 64 years old - you may need Prevnar 13 or the adult Pneumonia vaccine.  **Flu shots are available--- please call and schedule a FLU-CLINIC appointment**  After your visit with [place] today you will receive a survey in the mail or online from [employer] regarding your care with [place]. Please take a moment to fill this out. Your feedback is very important to [place] as you can help [place] better understand your patient needs as well as improve your experience and satisfaction. WE CARE ABOUT YOU!!!   We will call with lab work results as soon as they become available Do not forget to get your eye exam Stay active physically in for the rest of the winter drink plenty of fluids and stay well hydrated and keep the house as cool as possible    Nyra Capes MD

## 2016-12-29 LAB — CMP14+EGFR
ALBUMIN: 4.7 g/dL (ref 3.6–4.8)
ALT: 40 IU/L — ABNORMAL HIGH (ref 0–32)
AST: 44 IU/L — ABNORMAL HIGH (ref 0–40)
Albumin/Globulin Ratio: 1.9 (ref 1.2–2.2)
Alkaline Phosphatase: 49 IU/L (ref 39–117)
BILIRUBIN TOTAL: 0.3 mg/dL (ref 0.0–1.2)
BUN / CREAT RATIO: 34 — AB (ref 12–28)
BUN: 20 mg/dL (ref 8–27)
CALCIUM: 10.7 mg/dL — AB (ref 8.7–10.3)
CO2: 22 mmol/L (ref 18–29)
CREATININE: 0.59 mg/dL (ref 0.57–1.00)
Chloride: 98 mmol/L (ref 96–106)
GFR, EST AFRICAN AMERICAN: 110 mL/min/{1.73_m2} (ref >59)
GFR, EST NON AFRICAN AMERICAN: 96 mL/min/{1.73_m2} (ref >59)
GLUCOSE: 103 mg/dL — AB (ref 65–99)
Globulin, Total: 2.5 g/dL (ref 1.5–4.5)
Potassium: 4.2 mmol/L (ref 3.5–5.2)
Sodium: 143 mmol/L (ref 134–144)
TOTAL PROTEIN: 7.2 g/dL (ref 6.0–8.5)

## 2016-12-30 NOTE — Addendum Note (Signed)
Addended by: Shelbie Ammons on: 12/30/2016 10:44 AM   Modules accepted: Orders

## 2017-01-04 LAB — HM DIABETES EYE EXAM

## 2017-01-21 ENCOUNTER — Other Ambulatory Visit: Payer: Self-pay | Admitting: *Deleted

## 2017-01-21 MED ORDER — AMITRIPTYLINE HCL 10 MG PO TABS
10.0000 mg | ORAL_TABLET | Freq: Every day | ORAL | 1 refills | Status: DC
Start: 2017-01-21 — End: 2017-04-14

## 2017-01-21 MED ORDER — METFORMIN HCL 1000 MG PO TABS: ORAL_TABLET | ORAL | 1 refills | Status: DC

## 2017-04-14 ENCOUNTER — Encounter: Payer: Self-pay | Admitting: Family Medicine

## 2017-04-14 ENCOUNTER — Other Ambulatory Visit: Payer: Self-pay | Admitting: Physician Assistant

## 2017-04-14 ENCOUNTER — Other Ambulatory Visit: Payer: Self-pay | Admitting: Family Medicine

## 2017-04-15 MED ORDER — AMITRIPTYLINE HCL 10 MG PO TABS: 10.0000 mg | ORAL_TABLET | Freq: Every day | ORAL | 0 refills | Status: DC

## 2017-04-15 MED ORDER — MONTELUKAST SODIUM 10 MG PO TABS: 10.0000 mg | ORAL_TABLET | Freq: Every day | ORAL | 0 refills | Status: DC

## 2017-04-15 MED ORDER — RALOXIFENE HCL 60 MG PO TABS: 60.0000 mg | ORAL_TABLET | Freq: Every day | ORAL | 1 refills | Status: DC

## 2017-04-15 MED ORDER — LOSARTAN POTASSIUM-HCTZ 100-25 MG PO TABS: 1.0000 | ORAL_TABLET | Freq: Every day | ORAL | 0 refills | Status: DC

## 2017-04-19 ENCOUNTER — Other Ambulatory Visit: Payer: Self-pay | Admitting: Family Medicine

## 2017-05-23 DIAGNOSIS — Z1231 Encounter for screening mammogram for malignant neoplasm of breast: Secondary | ICD-10-CM | POA: Diagnosis not present

## 2017-06-29 ENCOUNTER — Ambulatory Visit: Payer: Medicare Other | Admitting: Family Medicine

## 2017-07-09 ENCOUNTER — Telehealth: Payer: Medicare Other | Admitting: Physician Assistant

## 2017-07-09 DIAGNOSIS — R399 Unspecified symptoms and signs involving the genitourinary system: Secondary | ICD-10-CM

## 2017-07-09 MED ORDER — CIPROFLOXACIN HCL 500 MG PO TABS: 500.0000 mg | ORAL_TABLET | Freq: Two times a day (BID) | ORAL | 0 refills | Status: DC

## 2017-07-09 NOTE — Progress Notes (Signed)
We are sorry that you are not feeling well.  Here is how we plan to help!  Based on what you shared with me it looks like you most likely have a simple urinary tract infection.  A UTI (Urinary Tract Infection) is a bacterial infection of the bladder.  Most cases of urinary tract infections are simple to treat but a key part of your care is to encourage you to drink plenty of fluids and watch your symptoms carefully.  Giving fever and that you have previously done well with this medication, I have prescribed Ciprofloxacin 500 mg twice a day for 5 days.  Your symptoms should gradually improve. Call us if the burning in your urine worsens, you develop worsening fever, back pain or pelvic pain or if your symptoms do not resolve after completing the antibiotic.  Urinary tract infections can be prevented by drinking plenty of water to keep your body hydrated.  Also be sure when you wipe, wipe from front to back and don't hold it in!  If possible, empty your bladder every 4 hours.  Your e-visit answers were reviewed by a board certified advanced clinical practitioner to complete your personal care plan.  Depending on the condition, your plan could have included both over the counter or prescription medications.  If there is a problem please reply  once you have received a response from your provider.  Your safety is important to [place].  If you have drug allergies check your prescription carefully.    You can use MyChart to ask questions about today's visit, request a non-urgent call back, or ask for a work or school excuse for 24 hours related to this e-Visit. If it has been greater than 24 hours you will need to follow up with your provider, or enter a new e-Visit to address those concerns.   You will get an e-mail in the next two days asking about your experience.  I hope that your e-visit has been valuable and will speed your recovery. Thank you for using e-visits.

## 2017-07-14 ENCOUNTER — Other Ambulatory Visit: Payer: Self-pay | Admitting: Family Medicine

## 2017-07-26 ENCOUNTER — Other Ambulatory Visit: Payer: Self-pay | Admitting: Family Medicine

## 2017-07-26 NOTE — Telephone Encounter (Signed)
Next visit 08/05/17

## 2017-08-02 ENCOUNTER — Other Ambulatory Visit: Payer: Self-pay | Admitting: Family Medicine

## 2017-08-05 ENCOUNTER — Encounter: Payer: Self-pay | Admitting: Family Medicine

## 2017-08-05 ENCOUNTER — Ambulatory Visit (INDEPENDENT_AMBULATORY_CARE_PROVIDER_SITE_OTHER): Payer: Medicare Other | Admitting: Family Medicine

## 2017-08-05 ENCOUNTER — Ambulatory Visit (INDEPENDENT_AMBULATORY_CARE_PROVIDER_SITE_OTHER): Payer: Medicare Other

## 2017-08-05 VITALS — BP 125/83 | HR 103 | Temp 98.9°F | Ht 60.0 in | Wt 201.0 lb

## 2017-08-05 DIAGNOSIS — G4737 Central sleep apnea in conditions classified elsewhere: Secondary | ICD-10-CM | POA: Diagnosis not present

## 2017-08-05 DIAGNOSIS — I10 Essential (primary) hypertension: Secondary | ICD-10-CM

## 2017-08-05 DIAGNOSIS — H9190 Unspecified hearing loss, unspecified ear: Secondary | ICD-10-CM | POA: Diagnosis not present

## 2017-08-05 DIAGNOSIS — E1169 Type 2 diabetes mellitus with other specified complication: Secondary | ICD-10-CM

## 2017-08-05 DIAGNOSIS — E119 Type 2 diabetes mellitus without complications: Secondary | ICD-10-CM | POA: Diagnosis not present

## 2017-08-05 DIAGNOSIS — E785 Hyperlipidemia, unspecified: Secondary | ICD-10-CM | POA: Diagnosis not present

## 2017-08-05 DIAGNOSIS — K219 Gastro-esophageal reflux disease without esophagitis: Secondary | ICD-10-CM | POA: Diagnosis not present

## 2017-08-05 DIAGNOSIS — Z6838 Body mass index (BMI) 38.0-38.9, adult: Secondary | ICD-10-CM

## 2017-08-05 DIAGNOSIS — E559 Vitamin D deficiency, unspecified: Secondary | ICD-10-CM | POA: Diagnosis not present

## 2017-08-05 DIAGNOSIS — R011 Cardiac murmur, unspecified: Secondary | ICD-10-CM

## 2017-08-05 DIAGNOSIS — K760 Fatty (change of) liver, not elsewhere classified: Secondary | ICD-10-CM | POA: Diagnosis not present

## 2017-08-05 LAB — BAYER DCA HB A1C WAIVED: HB A1C (BAYER DCA - WAIVED): 6.1 % (ref <7.0)

## 2017-08-05 MED ORDER — FENOFIBRATE 160 MG PO TABS: 160.0000 mg | ORAL_TABLET | Freq: Every day | ORAL | 3 refills | Status: DC

## 2017-08-05 NOTE — Addendum Note (Signed)
Addended by: Zannie Cove on: 08/05/2017 02:53 PM   Modules accepted: Orders

## 2017-08-05 NOTE — Patient Instructions (Addendum)
Medicare Annual Wellness Visit  [place] and the medical providers at [facility] strive to bring you the best medical care.  In doing so we not only want to address your current medical conditions and concerns but also to detect new conditions early and prevent illness, disease and health-related problems.    Medicare offers a yearly Wellness Visit which allows our clinical staff to assess your need for preventative services including immunizations, lifestyle education, counseling to decrease risk of preventable diseases and screening for fall risk and other medical concerns.    This visit is provided free of charge (no copay) for all Medicare recipients. The clinical pharmacists at [facility] have begun to conduct these Wellness Visits which will also include a thorough review of all your medications.    As you primary medical provider recommend that you make an appointment for your Annual Wellness Visit if you have not done so already this year.  You may set up this appointment before you leave today or you may call back ([phone]) and schedule an appointment.  Please make sure when you call that you mention that you are scheduling your Annual Wellness Visit with the clinical pharmacist so that the appointment may be made for the proper length of time.     Continue current medications. Continue good therapeutic lifestyle changes which include good diet and exercise. Fall precautions discussed with patient. If an FOBT was given today- please return it to our front desk. If you are over 77 years old - you may need Prevnar 65 or the adult Pneumonia vaccine.  **Flu shots are available--- please call and schedule a FLU-CLINIC appointment**  After your visit with [place] today you will receive a survey in the mail or online from [employer] regarding your care with [place]. Please take a moment to fill this out. Your feedback is very  important to [place] as you can help [place] better understand your patient needs as well as improve your experience and satisfaction. WE CARE ABOUT YOU!!!   Continue with current treatment and aggressive therapeutic lifestyle changes Check blood sugars regularly Monitor blood pressures when possible at home Use inhalers as needed Use Flonase regularly especially this time of the year Continue with antidepressant. We will arrange for you to see the cardiologist because you have a history of multiple risk factors and obesity and because of the systolic ejection murmur that was heard during the visit today.

## 2017-08-05 NOTE — Progress Notes (Signed)
Subjective:    Patient ID: Melissa Bowman, female    DOB: 1951/05/14, 66 y.o.   MRN: [id number]  HPI Pt here for follow up and management of chronic medical problems which includes diabetes, hyperlipidemia and hypertension. She is taking medication regularly. The patient is doing well overall. She has no specific complaints. She is requesting refills on her fenofibrate. She'll also get a urine microalbumin and given an FOBT lab work and a chest x-ray today. Her weight is up about 3 pounds from the last recorded weight we have. Her body mass index is 38.80 and with 2 or more comorbid conditions she would be classified as morbidly obese. She has hyperlipidemia and hypertension and diabetes. The patient denies any chest pain shortness of breath trouble with swallowing heartburn indigestion nausea vomiting diarrhea or blood in the stool. She is passing her water now without problems and did have a recent urinary tract infection but a follow-up urine was negative. She has been dealing with a lot of stress in the family, just typical grandparent stress and medical stresses with spouses etc. She is doing better with that now. She gets her eye exam yearly in December. Unfortunately, she has not checked any blood sugars recently.      Patient Active Problem List   Diagnosis Date Noted  . Osteopenia 12/26/2013  . Type 2 diabetes mellitus (HCC) 10/31/2013  . Hypertension 05/14/2013  . Hyperlipidemia 05/14/2013  . Depression 05/14/2013  . Allergic rhinitis 05/14/2013  . Vitamin D deficiency 05/14/2013  . Hemorrhage of rectum and anus 09/15/2011  . Dysphagia, pharyngoesophageal phase 09/15/2011  . NAFLD (nonalcoholic fatty liver disease) 11/91/4782  . Chronic RUQ pain 09/15/2011  . Diverticulosis of colon (without mention of hemorrhage) 09/15/2011  . BRBPR (bright red blood per rectum) 08/27/2011  . Internal hemorrhoids without mention of complication 08/27/2011  . Esophageal reflux 08/27/2011  .  Abdominal pain 08/27/2011  . Esophageal dysphagia 08/27/2011  . Obesity 08/27/2011  . Anxiety and depression 08/27/2011  . 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 albuterol (PROVENTIL, VENTOLIN) (5 MG/ML) 0.5% NEBU Take by nebulization continuous.    Marland Kitchen amitriptyline (ELAVIL) 10 MG tablet Take 1 tablet (10 mg total) by mouth at bedtime.  Marland Kitchen amLODipine (NORVASC) 10 MG tablet TAKE 1 TABLET BY MOUTH  DAILY FOR BLOOD PRESSURE AS DIRECTED  . aspirin EC 81 MG tablet Take 1 tablet (81 mg total) by mouth daily.  . Calcium 1200-1000 MG-UNIT CHEW Chew 1 tablet by mouth daily.   . Cholecalciferol (VITAMIN D-3) 5000 UNITS TABS Take by mouth daily.    . diclofenac (VOLTAREN) 75 MG EC tablet Take 1 tablet (75 mg total) by mouth 2 (two) times daily.  . fenofibrate 160 MG tablet TAKE 1 TABLET BY MOUTH  DAILY  . fluticasone (FLONASE) 50 MCG/ACT nasal spray Use 2 sprays in each  nostril daily  . glucose blood (ONE TOUCH ULTRA TEST) test strip TEST BLOOD SUGAR 3 TIMES A DAY  . losartan-hydrochlorothiazide (HYZAAR) 100-25 MG tablet Take 1 Tablet by mouth once daily  . metFORMIN (GLUCOPHAGE) 1000 MG tablet Take 1 Tablet by mouth 2 times a day with meals  . montelukast (SINGULAIR) 10 MG tablet Take 1 Tablet by mouth once daily  . omeprazole (PRILOSEC) 40 MG capsule TAKE 1 CAPSULE BY MOUTH  DAILY  . PROAIR HFA 108 (90 BASE) MCG/ACT inhaler Use 2 puffs into the lungs  every 6 hours as needed for wheezing or shortness of  breath  . raloxifene (EVISTA) 60 MG tablet Take 1 Tablet by mouth once daily  . SYMBICORT 160-4.5 MCG/ACT inhaler Use 2 puffs two times daily  . venlafaxine (EFFEXOR) 75 MG tablet TAKE 1 TABLET BY MOUTH 3  TIMES A DAY  . [DISCONTINUED] ciprofloxacin (CIPRO) 500 MG tablet Take 1 tablet (500 mg total) by mouth 2 (two) times daily.   No facility-administered encounter medications on file as of 08/05/2017.       Review of Systems  Constitutional: Negative.   HENT: Negative.   Eyes: Negative.   Respiratory: Negative.   Cardiovascular: Negative.   Gastrointestinal: Negative.   Endocrine: Negative.   Genitourinary: Negative.   Musculoskeletal: Negative.   Skin: Negative.   Allergic/Immunologic: Negative.   Neurological: Negative.   Hematological: Negative.   Psychiatric/Behavioral: Negative.        Objective:   Physical Exam  Constitutional: She is oriented to person, place, and time. She appears well-developed and well-nourished. No distress.  Pleasant and alert  HENT:  Head: Normocephalic and atraumatic.  Right Ear: External ear normal.  Left Ear: External ear normal.  Nose: Nose normal.  Mouth/Throat: No oropharyngeal exudate.  Patient has had surgery for sleep apnea and had the uvula removed in the past and still uses CPAP  Eyes: Pupils are equal, round, and reactive to light. Conjunctivae and EOM are normal. Right eye exhibits no discharge. Left eye exhibits no discharge. No scleral icterus.  Eye exams are up-to-date  Neck: Normal range of motion. Neck supple. No thyromegaly present.  No bruits thyromegaly or anterior cervical adenopathy  Cardiovascular: Normal rate, regular rhythm and intact distal pulses.   Murmur heard. Heart has a regular rate and rhythm at 84/m with a grade 1 to 2/6 systolic ejection murmur. She says that I told her she has had this before but it always he hasn't changed much because it is difficult to hear.  Pulmonary/Chest: Effort normal and breath sounds normal. No respiratory distress. She has no wheezes. She has no rales.  Clear anteriorly and posteriorly  Abdominal: Soft. Bowel sounds are normal. She exhibits no mass. There is no tenderness. There is no rebound and no guarding.  The abdomen is obese without masses tenderness or organ enlargement or bruits. There is no suprapubic tenderness.  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 She probably just needs to get a good internal exam and vaginal exam every couple of years. She will make sure that she gets this done.  Musculoskeletal: Normal range of motion. She exhibits no edema.  Lymphadenopathy:    She has no cervical adenopathy.  Neurological: She is alert and oriented to person, place, and time. She has normal reflexes. No cranial nerve deficit.  Skin: Skin is warm and dry. No rash noted.  Psychiatric: She has a normal mood and affect. Her behavior is normal. Judgment and thought content normal.  Nursing note and vitals reviewed.  BP 125/83 (BP Location: Left Arm)   Pulse (!) 103   Temp 98.9 F (37.2 C) (Oral)   Ht 5' (1.524 m)   Wt 201 lb (91.2 kg)   BMI 39.26 kg/m         Assessment & Plan:  1. Hyperlipidemia associated with type 2 diabetes mellitus (HCC) -The patient will continue with her fenofibrate and aggressive therapeutic lifestyle changes pending results of lab work - CBC  with Differential/Platelet - Lipid panel - DG Chest 2 View; Future  2. Type 2 diabetes mellitus without complication, without long-term current use of insulin (HCC) -She will continue with her metformin and aggressive therapeutic lifestyle changes pending results of lab work - BMP8+EGFR - CBC with Differential/Platelet - Bayer DCA Hb A1c Waived - Microalbumin / creatinine urine ratio  3. Essential hypertension -The blood pressure is good today and she will continue with current treatment - BMP8+EGFR - CBC with Differential/Platelet - Hepatic function panel - DG Chest 2 View; Future  4. Vitamin D deficiency -Tinny with vitamin D replacement pending results of lab work - CBC with Differential/Platelet - VITAMIN D 25 Hydroxy (Vit-D Deficiency, Fractures) - Hepatic function panel  5. Gastroesophageal reflux disease, esophagitis presence not specified -She is having no symptoms with reflux and we will  continue to take her omeprazole. She will continue with routine colonoscopies which would make hers do in 2022 but she does say that one of her sisters has had some polyps and she is going to let us know if they were precancerous or not. She will then need her colonoscopy sooner. - CBC with Differential/Platelet  6. Serum calcium elevated - CBC with Differential/Platelet  7. NAFLD (nonalcoholic fatty liver disease) - CBC with Differential/Platelet  8. 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 Decreased hearing, unspecified laterality -She does have hearing aids but did not have been in place today during the visit  10. Systolic ejection murmur -Referral to cardiology and also referral because of multiple risk factors for heart disease for a good cardiac exam  11. Morbid obesity (HCC) -The patient has morbid obesity based on having 2 or more comorbidities risk factors and a BMI greater than 35.  12. BMI 38.0-38.9,adult -Make every effort to lose weight through diet exercise and weight loss  Meds ordered this encounter  Medications  . fenofibrate 160 MG tablet    Sig: Take 1 tablet (160 mg total) by mouth daily.    Dispense:  90 tablet    Refill:  3   Patient Instructions                       Medicare Annual Wellness Visit  [facility] and the medical providers at [facility] strive to bring you the best medical care.  In doing so we not only want to address your current medical conditions and concerns but also to detect new conditions early and prevent illness, disease and health-related problems.    Medicare offers a yearly Wellness Visit which allows our clinical staff to assess your need for preventative services including immunizations, lifestyle education, counseling to decrease risk of preventable diseases and screening for fall risk and other  medical concerns.    This visit is provided free of charge (no copay) for all Medicare recipients. The clinical pharmacists at [facility] have begun to conduct these Wellness Visits which will also include a thorough review of all your medications.    As you primary medical provider recommend that you make an appointment for your Annual Wellness Visit if you have not done so already this year.  You may set up this appointment before you leave today or you may call back ([phone]) and schedule an appointment.  Please make sure when you call that you mention that you are scheduling your Annual Wellness  Visit with the clinical pharmacist so that the appointment may be made for the proper length of time.     Continue current medications. Continue good therapeutic lifestyle changes which include good diet and exercise. Fall precautions discussed with patient. If an FOBT was given today- please return it to our front desk. If you are over 51 years old - you may need Prevnar 13 or the adult Pneumonia vaccine.  **Flu shots are available--- please call and schedule a FLU-CLINIC appointment**  After your visit with [place] today you will receive a survey in the mail or online from [employer] regarding your care with [place]. Please take a moment to fill this out. Your feedback is very important to [place] as you can help [place] better understand your patient needs as well as improve your experience and satisfaction. WE CARE ABOUT YOU!!!   Continue with current treatment and aggressive therapeutic lifestyle changes Check blood sugars regularly Monitor blood pressures when possible at home Use inhalers as needed Use Flonase regularly especially this time of the year Continue with antidepressant. We will arrange for you to see the cardiologist because you have a history of multiple risk factors and obesity and because of the systolic ejection murmur that was heard during the visit today.    Nyra Capes MD

## 2017-08-06 LAB — HEPATIC FUNCTION PANEL
ALK PHOS: 63 IU/L (ref 39–117)
ALT: 77 IU/L — ABNORMAL HIGH (ref 0–32)
AST: 131 IU/L — ABNORMAL HIGH (ref 0–40)
Albumin: 4.9 g/dL — ABNORMAL HIGH (ref 3.6–4.8)
Bilirubin Total: 0.4 mg/dL (ref 0.0–1.2)
Bilirubin, Direct: 0.17 mg/dL (ref 0.00–0.40)
TOTAL PROTEIN: 7.4 g/dL (ref 6.0–8.5)

## 2017-08-06 LAB — CBC WITH DIFFERENTIAL/PLATELET
BASOS: 1 %
Basophils Absolute: 0.1 10*3/uL (ref 0.0–0.2)
EOS (ABSOLUTE): 0.2 10*3/uL (ref 0.0–0.4)
Eos: 3 %
HEMOGLOBIN: 14.4 g/dL (ref 11.1–15.9)
Hematocrit: 41.9 % (ref 34.0–46.6)
IMMATURE GRANS (ABS): 0 10*3/uL (ref 0.0–0.1)
Immature Granulocytes: 0 %
LYMPHS: 44 %
Lymphocytes Absolute: 3.2 10*3/uL — ABNORMAL HIGH (ref 0.7–3.1)
MCH: 31.2 pg (ref 26.6–33.0)
MCHC: 34.4 g/dL (ref 31.5–35.7)
MCV: 91 fL (ref 79–97)
MONOCYTES: 6 %
Monocytes Absolute: 0.4 10*3/uL (ref 0.1–0.9)
Neutrophils Absolute: 3.4 10*3/uL (ref 1.4–7.0)
Neutrophils: 46 %
Platelets: 292 10*3/uL (ref 150–379)
RBC: 4.61 x10E6/uL (ref 3.77–5.28)
RDW: 14.2 % (ref 12.3–15.4)
WBC: 7.2 10*3/uL (ref 3.4–10.8)

## 2017-08-06 LAB — BMP8+EGFR
BUN/Creatinine Ratio: 26 (ref 12–28)
BUN: 15 mg/dL (ref 8–27)
CALCIUM: 10.8 mg/dL — AB (ref 8.7–10.3)
CO2: 19 mmol/L — ABNORMAL LOW (ref 20–29)
Chloride: 97 mmol/L (ref 96–106)
Creatinine, Ser: 0.58 mg/dL (ref 0.57–1.00)
GFR calc Af Amer: 111 mL/min/{1.73_m2} (ref >59)
GFR, EST NON AFRICAN AMERICAN: 96 mL/min/{1.73_m2} (ref >59)
Glucose: 111 mg/dL — ABNORMAL HIGH (ref 65–99)
Potassium: 4.1 mmol/L (ref 3.5–5.2)
Sodium: 140 mmol/L (ref 134–144)

## 2017-08-06 LAB — LIPID PANEL
Chol/HDL Ratio: 7.1 ratio — ABNORMAL HIGH (ref 0.0–4.4)
Cholesterol, Total: 221 mg/dL — ABNORMAL HIGH (ref 100–199)
HDL: 31 mg/dL — ABNORMAL LOW (ref >39)
Triglycerides: 527 mg/dL — ABNORMAL HIGH (ref 0–149)

## 2017-08-06 LAB — MICROALBUMIN / CREATININE URINE RATIO
CREATININE, UR: 38.9 mg/dL
MICROALB/CREAT RATIO: 122.6 mg/g{creat} — AB (ref 0.0–30.0)
MICROALBUM., U, RANDOM: 47.7 ug/mL

## 2017-08-06 LAB — VITAMIN D 25 HYDROXY (VIT D DEFICIENCY, FRACTURES): VIT D 25 HYDROXY: 46 ng/mL (ref 30.0–100.0)

## 2017-08-08 ENCOUNTER — Other Ambulatory Visit: Payer: Self-pay | Admitting: *Deleted

## 2017-08-08 DIAGNOSIS — R945 Abnormal results of liver function studies: Principal | ICD-10-CM

## 2017-08-08 DIAGNOSIS — R7989 Other specified abnormal findings of blood chemistry: Secondary | ICD-10-CM

## 2017-08-16 ENCOUNTER — Ambulatory Visit (HOSPITAL_COMMUNITY)
Admission: RE | Admit: 2017-08-16 | Discharge: 2017-08-16 | Disposition: A | Discharge status: Home / Self Care | Payer: Medicare Other | Source: Ambulatory Visit | Attending: Family Medicine | Admitting: Family Medicine

## 2017-08-16 DIAGNOSIS — R7989 Other specified abnormal findings of blood chemistry: Secondary | ICD-10-CM | POA: Diagnosis not present

## 2017-08-16 DIAGNOSIS — R945 Abnormal results of liver function studies: Secondary | ICD-10-CM

## 2017-08-23 ENCOUNTER — Ambulatory Visit: Payer: Medicare Other | Admitting: Family Medicine

## 2017-09-15 ENCOUNTER — Other Ambulatory Visit: Payer: Medicare Other

## 2017-09-15 DIAGNOSIS — Z1211 Encounter for screening for malignant neoplasm of colon: Secondary | ICD-10-CM

## 2017-09-20 LAB — FECAL OCCULT BLOOD, IMMUNOCHEMICAL: FECAL OCCULT BLD: NEGATIVE

## 2017-09-23 ENCOUNTER — Telehealth: Payer: Self-pay | Admitting: *Deleted

## 2017-09-23 MED ORDER — AMLODIPINE BESYLATE 5 MG PO TABS: 5.0000 mg | ORAL_TABLET | Freq: Every day | ORAL | 3 refills | Status: DC

## 2017-09-23 NOTE — Telephone Encounter (Signed)
Refilled meds as requested. 

## 2017-10-17 ENCOUNTER — Other Ambulatory Visit: Payer: Self-pay | Admitting: Family Medicine

## 2017-10-20 ENCOUNTER — Other Ambulatory Visit: Payer: Self-pay | Admitting: *Deleted

## 2017-10-20 MED ORDER — GLUCOSE BLOOD VI STRP: ORAL_STRIP | 0 refills | Status: DC

## 2017-11-04 ENCOUNTER — Other Ambulatory Visit: Payer: Self-pay | Admitting: Physician Assistant

## 2017-11-04 ENCOUNTER — Other Ambulatory Visit: Payer: Self-pay | Admitting: Family Medicine

## 2017-11-17 ENCOUNTER — Emergency Department (HOSPITAL_BASED_OUTPATIENT_CLINIC_OR_DEPARTMENT_OTHER): Payer: Medicare Other

## 2017-11-17 ENCOUNTER — Emergency Department (HOSPITAL_BASED_OUTPATIENT_CLINIC_OR_DEPARTMENT_OTHER)
Admission: EM | Admit: 2017-11-17 | Discharge: 2017-11-17 | Disposition: A | Discharge status: Home / Self Care | Payer: Medicare Other | Attending: Emergency Medicine | Admitting: Emergency Medicine

## 2017-11-17 ENCOUNTER — Encounter (HOSPITAL_BASED_OUTPATIENT_CLINIC_OR_DEPARTMENT_OTHER): Payer: Self-pay | Admitting: *Deleted

## 2017-11-17 ENCOUNTER — Other Ambulatory Visit: Payer: Self-pay

## 2017-11-17 DIAGNOSIS — S79911A Unspecified injury of right hip, initial encounter: Secondary | ICD-10-CM | POA: Diagnosis not present

## 2017-11-17 DIAGNOSIS — S0081XA Abrasion of other part of head, initial encounter: Secondary | ICD-10-CM | POA: Insufficient documentation

## 2017-11-17 DIAGNOSIS — Y929 Unspecified place or not applicable: Secondary | ICD-10-CM | POA: Diagnosis not present

## 2017-11-17 DIAGNOSIS — S0091XA Abrasion of unspecified part of head, initial encounter: Secondary | ICD-10-CM | POA: Diagnosis not present

## 2017-11-17 DIAGNOSIS — I10 Essential (primary) hypertension: Secondary | ICD-10-CM | POA: Insufficient documentation

## 2017-11-17 DIAGNOSIS — M791 Myalgia, unspecified site: Secondary | ICD-10-CM

## 2017-11-17 DIAGNOSIS — Z7982 Long term (current) use of aspirin: Secondary | ICD-10-CM | POA: Insufficient documentation

## 2017-11-17 DIAGNOSIS — M25551 Pain in right hip: Secondary | ICD-10-CM | POA: Diagnosis not present

## 2017-11-17 DIAGNOSIS — S199XXA Unspecified injury of neck, initial encounter: Secondary | ICD-10-CM | POA: Diagnosis not present

## 2017-11-17 DIAGNOSIS — E119 Type 2 diabetes mellitus without complications: Secondary | ICD-10-CM | POA: Diagnosis not present

## 2017-11-17 DIAGNOSIS — Y999 Unspecified external cause status: Secondary | ICD-10-CM | POA: Diagnosis not present

## 2017-11-17 DIAGNOSIS — W19XXXA Unspecified fall, initial encounter: Secondary | ICD-10-CM

## 2017-11-17 DIAGNOSIS — M545 Low back pain: Secondary | ICD-10-CM | POA: Insufficient documentation

## 2017-11-17 DIAGNOSIS — Y9301 Activity, walking, marching and hiking: Secondary | ICD-10-CM | POA: Diagnosis not present

## 2017-11-17 DIAGNOSIS — M25511 Pain in right shoulder: Secondary | ICD-10-CM | POA: Insufficient documentation

## 2017-11-17 DIAGNOSIS — S4991XA Unspecified injury of right shoulder and upper arm, initial encounter: Secondary | ICD-10-CM | POA: Diagnosis not present

## 2017-11-17 DIAGNOSIS — S299XXA Unspecified injury of thorax, initial encounter: Secondary | ICD-10-CM | POA: Diagnosis not present

## 2017-11-17 DIAGNOSIS — T148XXA Other injury of unspecified body region, initial encounter: Secondary | ICD-10-CM

## 2017-11-17 DIAGNOSIS — Z7984 Long term (current) use of oral hypoglycemic drugs: Secondary | ICD-10-CM | POA: Insufficient documentation

## 2017-11-17 DIAGNOSIS — Z79899 Other long term (current) drug therapy: Secondary | ICD-10-CM | POA: Insufficient documentation

## 2017-11-17 DIAGNOSIS — S3992XA Unspecified injury of lower back, initial encounter: Secondary | ICD-10-CM | POA: Diagnosis not present

## 2017-11-17 DIAGNOSIS — S098XXA Other specified injuries of head, initial encounter: Secondary | ICD-10-CM | POA: Diagnosis present

## 2017-11-17 DIAGNOSIS — S0990XA Unspecified injury of head, initial encounter: Secondary | ICD-10-CM | POA: Diagnosis not present

## 2017-11-17 DIAGNOSIS — W01198A Fall on same level from slipping, tripping and stumbling with subsequent striking against other object, initial encounter: Secondary | ICD-10-CM | POA: Diagnosis not present

## 2017-11-17 MED ORDER — ACETAMINOPHEN 325 MG PO TABS: 650.0000 mg | ORAL_TABLET | Freq: Three times a day (TID) | ORAL | 0 refills | Status: DC

## 2017-11-17 MED ORDER — TRAMADOL HCL 50 MG PO TABS: 50.0000 mg | ORAL_TABLET | Freq: Three times a day (TID) | ORAL | 0 refills | Status: DC | PRN

## 2017-11-17 MED ORDER — TRAMADOL HCL 50 MG PO TABS
50.0000 mg | ORAL_TABLET | Freq: Once | ORAL | Status: AC
  Administered 2017-11-17: 50 mg via ORAL
  Filled 2017-11-17: qty 1

## 2017-11-17 MED ORDER — CYCLOBENZAPRINE HCL 5 MG PO TABS: 5.0000 mg | ORAL_TABLET | Freq: Every evening | ORAL | 0 refills | Status: DC | PRN

## 2017-11-17 NOTE — ED Triage Notes (Signed)
Pt c/o fall from standing landing on concrete x 2 hrs ago, lac to head and right shoulder injury

## 2017-11-17 NOTE — Discharge Instructions (Signed)
Take Tylenol 3 times a day. Continue taking all your other at home medications as prescribed. Use the sling as needed for pain control. Use the muscle relaxer as needed for muscle stiffness or soreness. Call your primary care and orthopedic doctors tomorrow to set up appointments for next week. Keep your head clean.  Wash gently with soap and water or a gentle shampoo.  Do not scrub or irritate it.  Return to the emergency room if you develop fevers, vomiting, numbness, or any new or worsening symptoms.

## 2017-11-17 NOTE — ED Notes (Signed)
Patient transported to X-ray 

## 2017-11-17 NOTE — ED Notes (Signed)
Pt in xray

## 2017-11-17 NOTE — ED Provider Notes (Signed)
[facility] EMERGENCY DEPARTMENT Provider Note   CSN: [id number] Arrival date & time: 11/17/17  2108     History   Chief Complaint Chief Complaint  Patient presents with  . Fall    HPI Melissa Bowman is a 66 y.o. female presenting for evaluation after a fall.  Patient states that she was walking when the sidewalk became uneven, and she tripped.  She denies dizziness or feeling poorly before falling.  She landed on her right side, with her right shoulder taking the majority of the fall.  She had turned her head, and hit her left forehead.  She denies loss of consciousness.  She takes aspirin, no other blood thinners.  She was able to stand up and walk after the fall.  She denies numbness, tingling, loss of bowel or bladder control.  She reports continued pain of the right shoulder, right lower back, and left head.  She denies vision changes, slurred speech, decreased concentration, nausea, vomiting, chest pain, difficulty breathing, or abdominal pain.  She denies pain of the lower extremities.  She has had 2 rotator cuff surgeries of her right shoulder.  She has not taken anything for pain.  Pain is constant, described as a spasms, and worse with movement.  Nothing makes it better.  HPI  Past Medical History:  Diagnosis Date  . Adrenal adenoma    left side   . Allergic rhinitis   . 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 Anxiety and depression   . Asthmatic bronchitis   . Bilateral carpal tunnel syndrome   . Condyloma acuminata   . Diabetes mellitus without complication ([place])   . Diverticulosis   . Eczema    STABLE   . GERD (gastroesophageal reflux disease)   . H. pylori infection 11/16/1993  . Hiatal hernia   . History of benign adrenal tumor 2007  . History of benign adrenal tumor [facility]   [facility]   . History of pneumonia 2007, 2008  . Hyperlipidemia   . Hypertension   . IBS (irritable bowel syndrome)   . Internal hemorrhoids   . Kyphosis   . Memory loss   . Metabolic syndrome    . Otitis media, serous, acute   . Sleep apnea   . Symptomatic menopausal or female climacteric states   . Vitamin D deficiency     Patient Active Problem List   Diagnosis Date Noted  . Osteopenia 12/26/2013  . Type 2 diabetes mellitus ([place]) 10/31/2013  . Hypertension 05/14/2013  . Hyperlipidemia 05/14/2013  . Depression 05/14/2013  . Allergic rhinitis 05/14/2013  . Vitamin D deficiency 05/14/2013  . Hemorrhage of rectum and anus 09/15/2011  . Dysphagia, pharyngoesophageal phase 09/15/2011  . NAFLD (nonalcoholic fatty liver disease) 09/15/2011  . Chronic RUQ pain 09/15/2011  . Diverticulosis of colon (without mention of hemorrhage) 09/15/2011  . BRBPR (bright red blood per rectum) 08/27/2011  . Internal hemorrhoids without mention of complication 12/87/8676  . Esophageal reflux 08/27/2011  . Abdominal pain 08/27/2011  . Esophageal dysphagia 08/27/2011  . Obesity 08/27/2011  . Anxiety and depression 08/27/2011  . 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 abdominal wall lematoma of fabdd hys.    . APPENDECTOMY    . CARPAL TUNNEL RELEASE    . CESAREAN SECTION     [place]    . right breast biopsy  10/96  . ROTATOR CUFF REPAIR  2002   right  . ROTATOR CUFF REPAIR/ bone spur  08/30/09  . sleep apnea surgery  5/96  . torn cartiledge rt knee x2  05/13/06   Dr. Charlotta Newton   . TOTAL ABDOMINAL HYSTERECTOMY W/ BILATERAL SALPINGOOPHORECTOMY  8/94   fibroids     OB History    No data available       Home Medications    Prior to Admission medications   Medication Sig Start Date End Date Taking? Authorizing Provider  acetaminophen (TYLENOL) 325 MG tablet Take 2 tablets (650 mg total) by mouth 3 (three) times daily. 11/17/17   Lorne Winkels, PA-C  albuterol (PROVENTIL, VENTOLIN) (5 MG/ML) 0.5% NEBU Take by nebulization continuous.      [provider]  amitriptyline  (ELAVIL) 10 MG tablet Take 1 tablet (10 mg total) by mouth at bedtime. 04/15/17   Chipper Herb, MD  amitriptyline (ELAVIL) 10 MG tablet TAKE 1 TABLET BY MOUTH AT  BEDTIME 11/04/17   Chipper Herb, MD  amLODipine (NORVASC) 5 MG tablet Take 1 tablet (5 mg total) by mouth daily. 09/23/17   Chipper Herb, MD  aspirin EC 81 MG tablet Take 1 tablet (81 mg total) by mouth daily. 12/26/13   Cherre Robins, PharmD  Calcium 1200-1000 MG-UNIT CHEW Chew 1 tablet by mouth daily.     [provider]  Cholecalciferol (VITAMIN D-3) 5000 UNITS TABS Take by mouth daily.      [provider]  cyclobenzaprine (FLEXERIL) 5 MG tablet Take 1 tablet (5 mg total) by mouth at bedtime as needed for muscle spasms. 11/17/17   Yi Haugan, PA-C  diclofenac (VOLTAREN) 75 MG EC tablet Take 1 tablet (75 mg total) by mouth 2 (two) times daily. 04/17/16   Timmothy Euler, MD  fenofibrate 160 MG tablet TAKE 1 TABLET BY MOUTH  DAILY 11/04/17   Terald Sleeper, PA-C  fluticasone Asencion Islam) 50 MCG/ACT nasal spray Use 2 sprays in each  nostril daily 11/19/15   Claretta Fraise, MD  glucose blood (ONE TOUCH ULTRA TEST) test strip TEST BLOOD SUGAR 3 TIMES A DAY 10/20/17   Chipper Herb, MD  losartan-hydrochlorothiazide (HYZAAR) 100-25 MG tablet TAKE 1 TABLET BY MOUTH  EVERY DAY AS DIRECTED 11/04/17   Terald Sleeper, PA-C  metFORMIN (GLUCOPHAGE) 1000 MG tablet Take 1 Tablet by mouth 2 times a day with meals 04/19/17   Chipper Herb, MD  montelukast (SINGULAIR) 10 MG tablet TAKE 1 TABLET BY MOUTH  DAILY 11/04/17   Terald Sleeper, PA-C  omeprazole (PRILOSEC) 40 MG capsule TAKE 1 CAPSULE BY MOUTH  DAILY 11/04/17   Terald Sleeper, PA-C  PROAIR HFA 108 (90 BASE) MCG/ACT inhaler Use 2 puffs into the lungs  every 6 hours as needed for wheezing or shortness of  breath 11/19/15   Claretta Fraise, MD  raloxifene (EVISTA) 60 MG tablet Take 1 Tablet by mouth once daily 07/14/17   Chipper Herb, MD  [facility] 160-4.5 MCG/ACT inhaler  Use 2 puffs two times daily 12/05/15   Chipper Herb, MD  traMADol (ULTRAM) 50 MG tablet Take 1 tablet (50 mg total) by mouth every 8 (eight) hours as needed for severe pain. 11/17/17   Teeghan Hammer, PA-C  venlafaxine (EFFEXOR) 75 MG tablet TAKE 1 TABLET BY MOUTH 3  TIMES A DAY 11/04/17   Chipper Herb, MD    Family History Family History  Problem Relation Age of Onset  . Parkinsonism Father   .  Emphysema Father   . Hip fracture Father   . Heart disease Mother   . COPD Brother   . Hypertension Brother   . Heart disease Brother   . Heart failure Sister   . Ovarian cancer Sister   . COPD Sister   . Lymphoma Sister   . COPD Sister     Social History Social History   Tobacco Use  . Smoking status: Never Smoker  . Smokeless tobacco: Never Used  Substance Use Topics  . Alcohol use: No  . Drug use: No     Allergies   Statins and Sulfa antibiotics   Review of Systems Review of Systems  Musculoskeletal: Positive for arthralgias and back pain.  Skin: Positive for wound.  Neurological: Negative for numbness.  All other systems reviewed and are negative.    Physical Exam Updated Vital Signs BP (!) 162/83 (BP Location: Left Arm)   Pulse 85   Temp 98.5 F (36.9 C) (Oral)   Resp 20   Ht 5' (1.524 m)   Wt 85.7 kg (189 lb)   SpO2 97%   BMI 36.91 kg/m   Physical Exam  Constitutional: She is oriented to person, place, and time. She appears well-developed and well-nourished. No distress.  HENT:  Head: Normocephalic.  Right Ear: Tympanic membrane, external ear and ear canal normal.  Left Ear: Tympanic membrane, external ear and ear canal normal.  Nose: Nose normal.  Mouth/Throat: Uvula is midline, oropharynx is clear and moist and mucous membranes are normal.  Abrasion of the left forehead without active bleeding.  No surrounding hematoma.  No injury noted elsewhere on the head.  Eyes: EOM are normal. Pupils are equal, round, and reactive to light.  Neck:  Normal range of motion. Neck supple.  Full ROM of head and neck without pain. No TTP of midline c-spine  Cardiovascular: Normal rate, regular rhythm and intact distal pulses.  Pulmonary/Chest: Effort normal and breath sounds normal. She exhibits no tenderness.  No TTP of the chest wall  Abdominal: Soft. She exhibits no distension. There is no tenderness.  No TTP of the abd  Musculoskeletal: She exhibits tenderness.  Tenderness palpation of right shoulder, right side neck musculature, and posterior right hip.  She is unable to range right arm due to pain.  Straight leg raise induces pain of the right hip.  Radial and pedal pulses intact bilaterally.  Sensation intact bilaterally.  Color and warmth equal bilaterally.  No obvious deformity, laceration, or injury noted of the extremities.  Neurological: She is alert and oriented to person, place, and time. She has normal strength. No cranial nerve deficit or sensory deficit. GCS eye subscore is 4. GCS verbal subscore is 5. GCS motor subscore is 6.  Fine movement and coordination intact  Skin: Skin is warm.  Psychiatric: She has a normal mood and affect.  Nursing note and vitals reviewed.    ED Treatments / Results  Labs (all labs ordered are listed, but only abnormal results are displayed) Labs Reviewed - No data to display  EKG  EKG Interpretation None       Radiology Dg Chest 2 View  Result Date: 11/17/2017 CLINICAL DATA:  66 year old female fell on concrete sidewalk complaining of shoulder, hip and lumbar pain. EXAM: CHEST  2 VIEW COMPARISON:  08/05/2017 FINDINGS: Heart is top-normal in size. There is minimal aortic atherosclerosis without aneurysmal dilatation. The lungs are clear. No apparent fracture of either shoulder nor bony thorax. IMPRESSION: No active cardiopulmonary disease. Electronically  Signed   By: Ashley Royalty M.D.   On: 11/17/2017 22:54   Dg Lumbar Spine Complete  Result Date: 11/17/2017 CLINICAL DATA:  Lumbar pain  after fall on sidewalk today. EXAM: LUMBAR SPINE - COMPLETE 4+ VIEW COMPARISON:  None. FINDINGS: There is no evidence of lumbar spine fracture. A lucency through the spinous process of L4 seen on one lateral view is not confirmed on the coned lateral view over the lower lumbar spine and sacrum. This is likely due to overlap of muscle planes. Mild multilevel facet arthropathy with joint space narrowing. Normal lumbar segmentation. Alignment is normal. Intervertebral disc spaces are maintained. IMPRESSION: 1. Mild multilevel degenerative facet arthropathy. 2. No acute lumbar spine fracture or listhesis. Electronically Signed   By: Ashley Royalty M.D.   On: 11/17/2017 22:58   Dg Shoulder Right  Result Date: 11/17/2017 CLINICAL DATA:  Trip and fall on sidewalk today. EXAM: RIGHT SHOULDER - 2+ VIEW COMPARISON:  None. FINDINGS: The humeral head is well-formed and located. No advanced arthropathy. The subacromial, glenohumeral and acromioclavicular joint spaces are intact. No destructive bony lesions. Soft tissue planes are non-suspicious. IMPRESSION: Negative. Electronically Signed   By: Elon Alas M.D.   On: 11/17/2017 22:46   Ct Head Wo Contrast  Result Date: 11/17/2017 CLINICAL DATA:  Patient fell 3 hours ago with left frontal abrasion. EXAM: CT HEAD WITHOUT CONTRAST CT CERVICAL SPINE WITHOUT CONTRAST TECHNIQUE: Multidetector CT imaging of the head and cervical spine was performed following the standard protocol without intravenous contrast. Multiplanar CT image reconstructions of the cervical spine were also generated. COMPARISON:  None. FINDINGS: CT HEAD FINDINGS Brain: Age related involutional changes the brain with chronic appearing small vessel ischemic disease of periventricular white matter, more focally along the anterior right basal ganglia and right internal capsule. No acute intracranial hemorrhage, midline shift or edema. No large vascular territory infarct. No intra-axial mass nor extra-axial  fluid collections. Vascular: No hyperdense vessels Skull: Negative for skull fracture or suspicious osseous lesions. Sinuses/Orbits: No acute finding. Other: Left frontoparietal scalp contusion. CT CERVICAL SPINE FINDINGS Alignment: Normal cervical lordosis Skull base and vertebrae: No acute cervical spine fracture. No primary or suspicious osseous lesions. Soft tissues and spinal canal: No prevertebral soft tissue swelling. No visible canal hematoma. Disc levels: No significant central canal or neural foraminal encroachment Upper chest: Negative Other: Nuchal ligament ossifications. Right-sided extracranial carotid arteriosclerosis. IMPRESSION: Left frontoparietal scalp contusion. 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 No acute intracranial abnormality. No acute cervical spine fracture or posttraumatic listhesis. Electronically Signed   By: Ashley Royalty M.D.   On: 11/17/2017 22:35   Ct Cervical Spine Wo Contrast  Result Date: 11/17/2017 CLINICAL DATA:  Patient fell 3 hours ago with left frontal abrasion. EXAM: CT HEAD WITHOUT CONTRAST CT CERVICAL SPINE WITHOUT CONTRAST TECHNIQUE: Multidetector CT imaging of the head and cervical spine was performed following the standard protocol without intravenous contrast. Multiplanar CT image reconstructions of the cervical spine were also generated. COMPARISON:  None. FINDINGS: CT HEAD FINDINGS Brain: Age related involutional changes the brain with chronic appearing small vessel ischemic disease of periventricular white matter, more focally along the anterior right basal ganglia and right internal capsule. No acute intracranial hemorrhage, midline shift or edema. No large vascular territory infarct. No intra-axial mass nor extra-axial fluid collections. Vascular: No hyperdense vessels Skull: Negative for skull fracture or suspicious osseous lesions. Sinuses/Orbits: No acute finding. Other: Left frontoparietal scalp contusion. CT CERVICAL SPINE  FINDINGS Alignment: Normal cervical  lordosis Skull base and vertebrae: No acute cervical spine fracture. No primary or suspicious osseous lesions. Soft tissues and spinal canal: No prevertebral soft tissue swelling. No visible canal hematoma. Disc levels: No significant central canal or neural foraminal encroachment Upper chest: Negative Other: Nuchal ligament ossifications. Right-sided extracranial carotid arteriosclerosis. IMPRESSION: Left frontoparietal scalp contusion. 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 No acute intracranial abnormality. No acute cervical spine fracture or posttraumatic listhesis. Electronically Signed   By: Ashley Royalty M.D.   On: 11/17/2017 22:35   Dg Hip Unilat W Or Wo Pelvis 2-3 Views Right  Result Date: 11/17/2017 CLINICAL DATA:  Right hip pain after fall on sidewalk today. EXAM: DG HIP (WITH OR WITHOUT PELVIS) 2-3V RIGHT COMPARISON:  None. FINDINGS: There is no evidence of hip fracture or dislocation. There is no evidence of arthropathy or other focal bone abnormality. IMPRESSION: No acute osseous abnormality of the pelvis and right hip. Electronically Signed   By: Ashley Royalty M.D.   On: 11/17/2017 23:00    Procedures Procedures (including critical care time)  Medications Ordered in ED Medications  traMADol (ULTRAM) tablet 50 mg (50 mg Oral Given 11/17/17 2223)     Initial Impression / Assessment and Plan / ED Course  I have reviewed the triage vital signs and the nursing notes.  Pertinent labs & imaging results that were available during my care of the patient were reviewed by me and considered in my medical decision making (see chart for details).     Patient presenting for evaluation of a mechanical fall.  Physical exam reassuring, no gross neurologic deficits.  Will obtain imaging of head, neck, shoulder, and back for further evaluation.  Tramadol given for pain.  Case discussed with attending, Dr. Laverta Baltimore evaluated the patient.  Imaging  negative for acute findings.  Discussed wound care for head abrasion and symptomatic treatment for likely muscular pain.  Arm placed in sling.  Patient to follow-up with her orthopedic doctor for further evaluation of her arm pain.  Patient to follow-up with PCP for wound recheck.  At this time, patient appears safe for discharge.  Return precautions given.  Patient states she understands and agrees to plan.   Final Clinical Impressions(s) / ED Diagnoses   Final diagnoses:  Fall, initial encounter  Abrasion  Muscle pain    ED Discharge Orders        Ordered    acetaminophen (TYLENOL) 325 MG tablet  3 times daily     11/17/17 2334    cyclobenzaprine (FLEXERIL) 5 MG tablet  At bedtime PRN     11/17/17 2334    traMADol (ULTRAM) 50 MG tablet  Every 8 hours PRN     11/17/17 2334       Franchot Heidelberg, PA-C 11/18/17 0047    Franchot Heidelberg, PA-C 11/18/17 0048    Margette Fast, MD 11/18/17 1030

## 2017-11-18 DIAGNOSIS — S46011A Strain of muscle(s) and tendon(s) of the rotator cuff of right shoulder, initial encounter: Secondary | ICD-10-CM | POA: Diagnosis not present

## 2017-11-18 DIAGNOSIS — M25511 Pain in right shoulder: Secondary | ICD-10-CM | POA: Diagnosis not present

## 2017-11-18 DIAGNOSIS — G8911 Acute pain due to trauma: Secondary | ICD-10-CM | POA: Diagnosis not present

## 2017-11-22 DIAGNOSIS — S46011D Strain of muscle(s) and tendon(s) of the rotator cuff of right shoulder, subsequent encounter: Secondary | ICD-10-CM | POA: Diagnosis not present

## 2017-11-22 DIAGNOSIS — G8929 Other chronic pain: Secondary | ICD-10-CM | POA: Diagnosis not present

## 2017-11-22 DIAGNOSIS — M25511 Pain in right shoulder: Secondary | ICD-10-CM | POA: Diagnosis not present

## 2017-11-24 ENCOUNTER — Encounter: Payer: Self-pay | Admitting: Family Medicine

## 2017-11-24 ENCOUNTER — Ambulatory Visit (INDEPENDENT_AMBULATORY_CARE_PROVIDER_SITE_OTHER): Payer: Medicare Other | Admitting: Family Medicine

## 2017-11-24 VITALS — BP 133/81 | HR 76 | Temp 98.3°F | Ht 60.0 in | Wt 189.0 lb

## 2017-11-24 DIAGNOSIS — Z01818 Encounter for other preprocedural examination: Secondary | ICD-10-CM

## 2017-11-24 DIAGNOSIS — M75101 Unspecified rotator cuff tear or rupture of right shoulder, not specified as traumatic: Secondary | ICD-10-CM

## 2017-11-24 DIAGNOSIS — E785 Hyperlipidemia, unspecified: Secondary | ICD-10-CM

## 2017-11-24 DIAGNOSIS — G4737 Central sleep apnea in conditions classified elsewhere: Secondary | ICD-10-CM | POA: Diagnosis not present

## 2017-11-24 DIAGNOSIS — R011 Cardiac murmur, unspecified: Secondary | ICD-10-CM | POA: Diagnosis not present

## 2017-11-24 DIAGNOSIS — E1169 Type 2 diabetes mellitus with other specified complication: Secondary | ICD-10-CM | POA: Diagnosis not present

## 2017-11-24 DIAGNOSIS — I10 Essential (primary) hypertension: Secondary | ICD-10-CM

## 2017-11-24 DIAGNOSIS — B372 Candidiasis of skin and nail: Secondary | ICD-10-CM

## 2017-11-24 MED ORDER — TRAMADOL HCL 50 MG PO TABS: 50.0000 mg | ORAL_TABLET | Freq: Two times a day (BID) | ORAL | 0 refills | Status: DC | PRN

## 2017-11-24 MED ORDER — NYSTATIN 100000 UNIT/GM EX POWD: Freq: Four times a day (QID) | CUTANEOUS | 1 refills | Status: DC

## 2017-11-24 NOTE — Patient Instructions (Signed)
Follow-up with orthopedics as planned Use nystatin powder as directed and take Diflucan as directed for yeast dermatitis We will arrange for a cardiac clearance because of multiple risk factors for heart disease

## 2017-11-24 NOTE — Progress Notes (Signed)
Subjective:    Patient ID: Melissa Bowman, female    DOB: 09-09-1951, 66 y.o.   MRN: [id number]  HPI  Patient here today for surgical clearance for a right rotator cuff tear. This is to be done by Dr Hardie Lora at [facility].  The patient sustained a fall about 1 week ago at the school system and somehow received a contusion to the left orbit and a rotator cuff tear to the right shoulder.  She has seen the orthopedic surgeon and he did an MRI which confirmed the rotator cuff tear.  He wants to do surgery to repair this.  After the fall at the emergency room she did receive scans of her head and everything was clear for any kind of cerebral contusion.  The patient does have a history of hypertension hyperlipidemia and diabetes.  Since the fall she is developed a yeast infection in the right axilla.  She is doing well overall other than the severe pain and inability to raise the right arm.   Patient Active Problem List   Diagnosis Date Noted  . Osteopenia 12/26/2013  . Type 2 diabetes mellitus (HCC) 10/31/2013  . Hypertension 05/14/2013  . Hyperlipidemia 05/14/2013  . Depression 05/14/2013  . Allergic rhinitis 05/14/2013  . Vitamin D deficiency 05/14/2013  . Hemorrhage of rectum and anus 09/15/2011  . Dysphagia, pharyngoesophageal phase 09/15/2011  . NAFLD (nonalcoholic fatty liver disease) 29/52/8413  . Chronic RUQ pain 09/15/2011  . Diverticulosis of colon (without mention of hemorrhage) 09/15/2011  . BRBPR (bright red blood per rectum) 08/27/2011  . Internal hemorrhoids without mention of complication 08/27/2011  . Esophageal reflux 08/27/2011  . Abdominal pain 08/27/2011  . Esophageal dysphagia 08/27/2011  . Obesity 08/27/2011  . Anxiety and depression 08/27/2011  . 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 acetaminophen (TYLENOL) 325 MG tablet Take 2 tablets (650 mg total) by mouth 3  (three) times daily.  Marland Kitchen albuterol (PROVENTIL, VENTOLIN) (5 MG/ML) 0.5% NEBU Take by nebulization continuous.    Marland Kitchen amitriptyline (ELAVIL) 10 MG tablet Take 1 tablet (10 mg total) by mouth at bedtime.  Marland Kitchen amLODipine (NORVASC) 5 MG tablet Take 1 tablet (5 mg total) by mouth daily.  Marland Kitchen aspirin EC 81 MG tablet Take 1 tablet (81 mg total) by mouth daily.  . Calcium 1200-1000 MG-UNIT CHEW Chew 1 tablet by mouth daily.   . Cholecalciferol (VITAMIN D-3) 5000 UNITS TABS Take by mouth daily.    . cyclobenzaprine (FLEXERIL) 5 MG tablet Take 1 tablet (5 mg total) by mouth at bedtime as needed for muscle spasms.  . diclofenac (VOLTAREN) 75 MG EC tablet Take 1 tablet (75 mg total) by mouth 2 (two) times daily.  . fenofibrate 160 MG tablet TAKE 1 TABLET BY MOUTH  DAILY  . fluticasone (FLONASE) 50 MCG/ACT nasal spray Use 2 sprays in each  nostril daily  . glucose blood (ONE TOUCH ULTRA TEST) test strip TEST BLOOD SUGAR 3 TIMES A DAY  . losartan-hydrochlorothiazide (HYZAAR) 100-25 MG tablet TAKE 1 TABLET BY MOUTH  EVERY DAY AS DIRECTED  . metFORMIN (GLUCOPHAGE) 1000 MG tablet Take 1 Tablet by mouth 2 times a day with meals  . montelukast (SINGULAIR) 10 MG tablet TAKE 1 TABLET BY MOUTH  DAILY  . omeprazole (PRILOSEC) 40 MG capsule TAKE 1 CAPSULE BY MOUTH  DAILY  . PROAIR HFA 108 (90 BASE) MCG/ACT inhaler Use 2 puffs into the  lungs  every 6 hours as needed for wheezing or shortness of  breath  . raloxifene (EVISTA) 60 MG tablet Take 1 Tablet by mouth once daily  . SYMBICORT 160-4.5 MCG/ACT inhaler Use 2 puffs two times daily  . traMADol (ULTRAM) 50 MG tablet Take 1 tablet (50 mg total) by mouth every 8 (eight) hours as needed for severe pain.  Marland Kitchen venlafaxine (EFFEXOR) 75 MG tablet TAKE 1 TABLET BY MOUTH 3  TIMES A DAY  . [DISCONTINUED] amitriptyline (ELAVIL) 10 MG tablet TAKE 1 TABLET BY MOUTH AT  BEDTIME   No facility-administered encounter medications on file as of 11/24/2017.       Review of Systems    Constitutional: Negative.   HENT: Negative.   Eyes: Negative.   Respiratory: Negative.   Cardiovascular: Negative.   Gastrointestinal: Negative.   Endocrine: Negative.   Genitourinary: Negative.   Musculoskeletal: Positive for arthralgias (right shoulder pain).  Skin: Positive for color change (facial bruising).  Allergic/Immunologic: Negative.   Neurological: Negative.   Hematological: Negative.   Psychiatric/Behavioral: Negative.        Objective:   Physical Exam  Constitutional: She is oriented to person, place, and time. She appears well-developed and well-nourished. No distress.  The patient is pleasant and relaxed despite the recent fall and the planned surgery that is going to require to fix the shoulder.  HENT:  Head: Normocephalic.  Right Ear: External ear normal.  Left Ear: External ear normal.  Nose: Nose normal.  Mouth/Throat: Oropharynx is clear and moist. No oropharyngeal exudate.  Periorbital bruising and contusion on left side  Eyes: Conjunctivae and EOM are normal. Pupils are equal, round, and reactive to light. Right eye exhibits no discharge. Left eye exhibits no discharge. No scleral icterus.  Neck: Normal range of motion. Neck supple. No thyromegaly present.  Cardiovascular: Normal rate, regular rhythm, normal heart sounds and intact distal pulses.  No murmur heard. Pulmonary/Chest: Effort normal and breath sounds normal. No respiratory distress. She has no wheezes. She has no rales.  Clear anteriorly and posteriorly  Abdominal: Soft. Bowel sounds are normal. She exhibits no mass. There is no tenderness. There is no rebound and no guarding.  Abdominal obesity without masses tenderness or organ enlargement or bruits  Musculoskeletal: She exhibits tenderness. She exhibits no edema.  Limited range of motion of right arm due to rotator cuff tear.  Full range of motion left arm.  Lymphadenopathy:    She has no cervical adenopathy.  Neurological: She is alert  and oriented to person, place, and time.  Skin: Skin is warm and dry. Rash noted. There is erythema.  Yeast rash right axilla  Psychiatric: She has a normal mood and affect. Her behavior is normal. Judgment and thought content normal.  Nursing note and vitals reviewed.  BP 133/81 (BP Location: Left Arm)   Pulse 76   Temp 98.3 F (36.8 C) (Oral)   Ht 5' (1.524 m)   Wt 189 lb (85.7 kg)   BMI 36.91 kg/m    EKG within normal limits     Assessment & Plan:  1. Pre-operative clearance - EKG 12-Lead - CBC with Differential/Platelet - BMP8+EGFR - Hepatic function panel - Bayer DCA Hb A1c Waived - Ambulatory referral to Cardiology  2. Essential hypertension -Blood pressure is good today and she will continue with current treatment  3. Hyperlipidemia associated with type 2 diabetes mellitus (HCC) -Until surgery, continue with aggressive therapeutic lifestyle changes as much as possible along with diet to  keep sugar low and cholesterol under control  4. 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 Systolic ejection murmur -Cardiac clearance for surgery  6. Tear of right rotator cuff, unspecified tear extent -Follow-up with orthopedist as planned  7. Yeast dermatitis -Nystatin powder along with Diflucan 1 daily for 3 days 150 mg  Meds ordered this encounter  Medications  . traMADol (ULTRAM) 50 MG tablet    Sig: Take 1 tablet (50 mg total) by mouth every 12 (twelve) hours as needed for severe pain.    Dispense:  30 tablet    Refill:  0  . nystatin (MYCOSTATIN/NYSTOP) powder    Sig: Apply topically 4 (four) times daily.    Dispense:  60 g    Refill:  1   Patient Instructions  Follow-up with orthopedics as planned Use nystatin powder as directed and take Diflucan as directed for yeast dermatitis We will arrange for a cardiac clearance because of multiple risk factors for heart disease  Nyra Capes MD

## 2017-12-01 ENCOUNTER — Other Ambulatory Visit: Payer: Self-pay | Admitting: *Deleted

## 2017-12-01 MED ORDER — RALOXIFENE HCL 60 MG PO TABS: 60.0000 mg | ORAL_TABLET | Freq: Every day | ORAL | 1 refills | Status: DC

## 2017-12-23 ENCOUNTER — Ambulatory Visit: Payer: Medicare Other | Admitting: Cardiovascular Disease

## 2017-12-23 ENCOUNTER — Encounter: Payer: Self-pay | Admitting: Cardiovascular Disease

## 2017-12-23 ENCOUNTER — Other Ambulatory Visit: Payer: Self-pay | Admitting: Physician Assistant

## 2017-12-23 ENCOUNTER — Other Ambulatory Visit: Payer: Self-pay | Admitting: Family Medicine

## 2017-12-23 ENCOUNTER — Telehealth: Payer: Self-pay | Admitting: Cardiovascular Disease

## 2017-12-23 VITALS — BP 140/82 | HR 72 | Ht 60.0 in | Wt 203.0 lb

## 2017-12-23 DIAGNOSIS — Z01818 Encounter for other preprocedural examination: Secondary | ICD-10-CM

## 2017-12-23 DIAGNOSIS — I10 Essential (primary) hypertension: Secondary | ICD-10-CM

## 2017-12-23 DIAGNOSIS — E785 Hyperlipidemia, unspecified: Secondary | ICD-10-CM | POA: Diagnosis not present

## 2017-12-23 DIAGNOSIS — R011 Cardiac murmur, unspecified: Secondary | ICD-10-CM

## 2017-12-23 NOTE — Addendum Note (Signed)
Addended by: Laurine Blazer on: 12/23/2017 03:08 PM   Modules accepted: Orders

## 2017-12-23 NOTE — Progress Notes (Signed)
CARDIOLOGY CONSULT NOTE  Patient ID: Melissa Bowman MRN: [id number] DOB/AGE: 1951/02/10 67 y.o.  Admit date: (Not on file) Primary Physician: Ernestina Penna, MD Referring Physician: Ernestina Penna, MD  Reason for Consultation: Preoperative risk stratification  HPI: Melissa Bowman is a 67 y.o. female who is being seen today for the evaluation of preoperative risk stratification at the request of Ernestina Penna, MD.   She has a right rotator cuff tear and is being scheduled for surgery.  Past medical history includes hypertension, hyperlipidemia, diabetes, and obstructive sleep apnea.  Lipids 08/05/17 showed total cholesterol 221, triglycerides 527, HDL 31, LDL could not be calculated.  Hemoglobin A1c 6.1% on 08/05/17.  I personally reviewed the ECG performed on 11/24/17 which demonstrated sinus rhythm with no ischemic ST segment or T wave abnormalities, nor any arrhythmias.  The patient denies any symptoms of chest pain, palpitations, shortness of breath, lightheadedness, dizziness, leg swelling, orthopnea, PND, and syncope.  She is able to walk up 14 steps from her basement and can also carry 5-6 bags of groceries at a time up stairs without any activity limiting symptoms.  Allergies  Allergen Reactions  . Statins Other (See Comments)    myalgias  . Sulfa Antibiotics Other (See Comments)    Emycin, VIOX, ERT Causes severe stomach cramps     Current Outpatient Medications  Medication Sig Dispense Refill  . acetaminophen (TYLENOL) 325 MG tablet Take 2 tablets (650 mg total) by mouth 3 (three) times daily. 30 tablet 0  . albuterol (PROVENTIL, VENTOLIN) (5 MG/ML) 0.5% NEBU Take by nebulization continuous.      Marland Kitchen amitriptyline (ELAVIL) 10 MG tablet Take 1 tablet (10 mg total) by mouth at bedtime. 90 tablet 0  . amLODipine (NORVASC) 5 MG tablet Take 1 tablet (5 mg total) by mouth daily. 90 tablet 3  . aspirin EC 81 MG tablet Take 1 tablet (81 mg total) by mouth  daily.    . Calcium 1200-1000 MG-UNIT CHEW Chew 1 tablet by mouth daily.     . Cholecalciferol (VITAMIN D-3) 5000 UNITS TABS Take by mouth daily.      . cyclobenzaprine (FLEXERIL) 5 MG tablet Take 1 tablet (5 mg total) by mouth at bedtime as needed for muscle spasms. 15 tablet 0  . fenofibrate 160 MG tablet TAKE 1 TABLET BY MOUTH  DAILY 90 tablet 0  . fluticasone (FLONASE) 50 MCG/ACT nasal spray Use 2 sprays in each  nostril daily 48 g 2  . glucose blood (ONE TOUCH ULTRA TEST) test strip TEST BLOOD SUGAR 3 TIMES A DAY 300 each 0  . losartan-hydrochlorothiazide (HYZAAR) 100-25 MG tablet TAKE 1 TABLET BY MOUTH  EVERY DAY AS DIRECTED 90 tablet 0  . metFORMIN (GLUCOPHAGE) 1000 MG tablet Take 1 Tablet by mouth 2 times a day with meals 180 tablet 2  . montelukast (SINGULAIR) 10 MG tablet TAKE 1 TABLET BY MOUTH  DAILY 90 tablet 0  . omeprazole (PRILOSEC) 40 MG capsule TAKE 1 CAPSULE BY MOUTH  DAILY 90 capsule 0  . PROAIR HFA 108 (90 BASE) MCG/ACT inhaler Use 2 puffs into the lungs  every 6 hours as needed for wheezing or shortness of  breath 25.5 g 2  . raloxifene (EVISTA) 60 MG tablet Take 1 tablet (60 mg total) by mouth daily. 90 tablet 1  . SYMBICORT 160-4.5 MCG/ACT inhaler Use 2 puffs two times daily 30.6 g 2  . venlafaxine (EFFEXOR) 75 MG tablet TAKE  1 TABLET BY MOUTH 3  TIMES A DAY 270 tablet 0  . venlafaxine (EFFEXOR) 75 MG tablet TAKE 1 TABLET BY MOUTH 3  TIMES A DAY 270 tablet 0   No current facility-administered medications for this visit.     Past Medical History:  Diagnosis Date  . Adrenal adenoma    left side   . Allergic rhinitis   . 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 Anxiety and depression   . Asthmatic bronchitis   . Bilateral carpal tunnel syndrome   . Condyloma acuminata   . Diabetes mellitus without complication (HCC)   . Diverticulosis   . Eczema    STABLE   . GERD (gastroesophageal reflux disease)   . H. pylori infection 11/16/1993  . Hiatal hernia   . History of benign  adrenal tumor 2007  . History of benign adrenal tumor [facility]   [facility]   . History of pneumonia 2007, 2008  . Hyperlipidemia   . Hypertension   . IBS (irritable bowel syndrome)   . Internal hemorrhoids   . Kyphosis   . Memory loss   . Metabolic syndrome   . Otitis media, serous, acute   . Sleep apnea   . Symptomatic menopausal or female climacteric states   . Vitamin D deficiency     Past Surgical History:  Procedure Laterality Date  . abdominal wall lematoma of fabdd hys.    . APPENDECTOMY    . CARPAL TUNNEL RELEASE    . CESAREAN SECTION     X2  . INCISIONAL HERNIA REPAIR    . right breast biopsy  10/96  . ROTATOR CUFF REPAIR  2002   right  . ROTATOR CUFF REPAIR/ bone spur  08/30/09  . sleep apnea surgery  5/96  . torn cartiledge rt knee x2  05/13/06   Dr. Trenton Founds   . TOTAL ABDOMINAL HYSTERECTOMY W/ BILATERAL SALPINGOOPHORECTOMY  8/94   fibroids     Social History   Socioeconomic History  . Marital status: Married    Spouse name: Not on file  . Number of children: 2  . Years of education: Not on file  . Highest education level: Not on file  Social Needs  . Financial resource strain: Not on file  . Food insecurity - worry: Not on file  . Food insecurity - inability: Not on file  . Transportation needs - medical: Not on file  . Transportation needs - non-medical: Not on file  Occupational History  . Occupation: housewife  Tobacco Use  . Smoking status: Never Smoker  . Smokeless tobacco: Never Used  Substance and Sexual Activity  . Alcohol use: No  . Drug use: No  . Sexual activity: Not on file  Other Topics Concern  . Not on file  Social History Narrative  . Not on file     No family history of premature CAD in 1st degree relatives.  Current Meds  Medication Sig  . acetaminophen (TYLENOL) 325 MG tablet Take 2 tablets (650 mg total) by mouth 3 (three) times daily.  Marland Kitchen albuterol (PROVENTIL, VENTOLIN) (5 MG/ML) 0.5% NEBU Take by nebulization continuous.      Marland Kitchen amitriptyline (ELAVIL) 10 MG tablet Take 1 tablet (10 mg total) by mouth at bedtime.  Marland Kitchen amLODipine (NORVASC) 5 MG tablet Take 1 tablet (5 mg total) by mouth daily.  Marland Kitchen aspirin EC 81 MG tablet Take 1 tablet (81 mg total) by mouth daily.  . Calcium 1200-1000 MG-UNIT CHEW Chew 1 tablet by  mouth daily.   . Cholecalciferol (VITAMIN D-3) 5000 UNITS TABS Take by mouth daily.    . cyclobenzaprine (FLEXERIL) 5 MG tablet Take 1 tablet (5 mg total) by mouth at bedtime as needed for muscle spasms.  . fenofibrate 160 MG tablet TAKE 1 TABLET BY MOUTH  DAILY  . fluticasone (FLONASE) 50 MCG/ACT nasal spray Use 2 sprays in each  nostril daily  . glucose blood (ONE TOUCH ULTRA TEST) test strip TEST BLOOD SUGAR 3 TIMES A DAY  . losartan-hydrochlorothiazide (HYZAAR) 100-25 MG tablet TAKE 1 TABLET BY MOUTH  EVERY DAY AS DIRECTED  . metFORMIN (GLUCOPHAGE) 1000 MG tablet Take 1 Tablet by mouth 2 times a day with meals  . montelukast (SINGULAIR) 10 MG tablet TAKE 1 TABLET BY MOUTH  DAILY  . omeprazole (PRILOSEC) 40 MG capsule TAKE 1 CAPSULE BY MOUTH  DAILY  . PROAIR HFA 108 (90 BASE) MCG/ACT inhaler Use 2 puffs into the lungs  every 6 hours as needed for wheezing or shortness of  breath  . raloxifene (EVISTA) 60 MG tablet Take 1 tablet (60 mg total) by mouth daily.  . SYMBICORT 160-4.5 MCG/ACT inhaler Use 2 puffs two times daily  . venlafaxine (EFFEXOR) 75 MG tablet TAKE 1 TABLET BY MOUTH 3  TIMES A DAY  . venlafaxine (EFFEXOR) 75 MG tablet TAKE 1 TABLET BY MOUTH 3  TIMES A DAY  . [DISCONTINUED] traMADol (ULTRAM) 50 MG tablet Take 1 tablet (50 mg total) by mouth every 12 (twelve) hours as needed for severe pain.      Review of systems complete and found to be negative unless listed above in HPI    Physical exam Blood pressure 140/82, pulse 72, height 5' (1.524 m), weight 203 lb (92.1 kg), SpO2 98 %. General: NAD Neck: No JVD, no thyromegaly or thyroid nodule.  Lungs: Clear to auscultation bilaterally with  normal respiratory effort. CV: Nondisplaced PMI. Regular rate and rhythm, normal S1/S2, no S3/S4, 2/6 systolic murmur over RUSB.  No peripheral edema.  No carotid bruit.    Abdomen: Soft, nontender, no distention.  Skin: Intact without lesions or rashes.  Neurologic: Alert and oriented x 3.  Psych: Normal affect. Extremities: No clubbing or cyanosis.  HEENT: Normal.   ECG: Most recent ECG reviewed.   Labs: Lab Results  Component Value Date/Time   K 4.1 08/05/2017 02:37 PM   BUN 15 08/05/2017 02:37 PM   CREATININE 0.58 08/05/2017 02:37 PM   CREATININE 0.54 05/09/2013 10:52 AM   ALT 77 (H) 08/05/2017 02:37 PM   TSH 1.878 05/09/2013 10:52 AM   HGB 14.4 08/05/2017 02:37 PM     Lipids: Lab Results  Component Value Date/Time   LDLCALC Comment 08/05/2017 02:37 PM   LDLCALC NOTES: 10/11/2013 09:09 AM   LDLCALC 109 (H) 05/09/2013 10:52 AM   CHOL 221 (H) 08/05/2017 02:37 PM   CHOL 208 (H) 05/09/2013 10:52 AM   TRIG 527 (H) 08/05/2017 02:37 PM   TRIG 356 (H) 01/09/2016 02:45 PM   TRIG 325 (H) 05/09/2013 10:52 AM   HDL 31 (L) 08/05/2017 02:37 PM   HDL 30 (L) 01/09/2016 02:45 PM   HDL 34 (L) 05/09/2013 10:52 AM        ASSESSMENT AND PLAN:   1.  Preoperative risk stratification: She is able to walk up 14 steps from her basement and can also carry 5-6 bags of groceries at a time up stairs without any activity limiting symptoms. She is at a low risk for adverse  outcomes with a 0.4% rate of myocardial infarction, pulmonary edema, ventricular fibrillation, cardiac arrest, or complete heart block as per the revised cardiac risk index. She is able to achieve 4 METS without difficulty.  No stress testing is indicated at this time.  2.  Cardiac murmur: She may have some degree of aortic valve sclerosis.  I do not appreciate any significant stenosis by physical exam.  I will obtain an echocardiogram.  3.  Hypertension: Blood pressure is mildly elevated.  No changes to therapy today.  4.   Hypercholesterolemia with hypertriglyceridemia: She is currently on fenofibrate.  Statin therapy is indicated given her history of type 2 diabetes.  I will defer to her PCP.   Disposition: Follow up to be determined.   Signed: Prentice Docker, M.D., F.A.C.C.  12/23/2017, 2:56 PM

## 2017-12-23 NOTE — Patient Instructions (Signed)
Medication Instructions:  Continue all current medications.  Labwork: none  Testing/Procedures:  Your physician has requested that you have an echocardiogram. Echocardiography is a painless test that uses sound waves to create images of your heart. It provides your doctor with information about the size and shape of your heart and how well your heart's chambers and valves are working. This procedure takes approximately one hour. There are no restrictions for this procedure.  Office will contact with results via phone or letter.    Follow-Up: To be determined.    Any Other Special Instructions Will Be Listed Below (If Applicable).  If you need a refill on your cardiac medications before your next appointment, please call your pharmacy.

## 2017-12-23 NOTE — Telephone Encounter (Signed)
Pre-cert Verification for the following procedure   Echo scheduled for 12/29/17

## 2017-12-26 ENCOUNTER — Other Ambulatory Visit: Payer: Self-pay | Admitting: *Deleted

## 2017-12-29 ENCOUNTER — Ambulatory Visit (INDEPENDENT_AMBULATORY_CARE_PROVIDER_SITE_OTHER): Payer: Medicare Other

## 2017-12-29 ENCOUNTER — Other Ambulatory Visit: Payer: Self-pay

## 2017-12-29 DIAGNOSIS — R011 Cardiac murmur, unspecified: Secondary | ICD-10-CM | POA: Diagnosis not present

## 2017-12-30 LAB — ECHOCARDIOGRAM COMPLETE
AV Peak grad: 18 mmHg
AV peak Index: 2.22
AV pk vel: 210 cm/s
AVAREAVTI: 4.18 cm2
AVLVOTPG: 38 mmHg
Ao pk vel: 1.47 m/s
CHL CUP RV SYS PRESS: 30 mmHg
EERAT: 5.7
EWDT: 320 ms
FS: 49 % — AB (ref 28–44)
IV/PV OW: 0.93
LA ID, A-P, ES: 33 mm
LA vol A4C: 60.9 ml
LADIAMINDEX: 1.76 cm/m2
LEFT ATRIUM END SYS DIAM: 33 mm
LV E/e'average: 5.7
LV PW d: 9.99 mm — AB (ref 0.6–1.1)
LV TDI E'LATERAL: 13.7
LV TDI E'MEDIAL: 8.32
LV dias vol index: 41 mL/m2
LV dias vol: 77 mL (ref 46–106)
LVEEMED: 5.7
LVELAT: 13.7 cm/s
LVOT area: 2.84 cm2
LVOT diameter: 19 mm
LVOTPV: 309 cm/s
Lateral S' vel: 11.6 cm/s
MV Dec: 320
MV pk E vel: 78.1 m/s
MVPG: 2 mmHg
MVPKAVEL: 126 m/s
PV Reg grad dias: 6 mmHg
PV Reg vel dias: 125 cm/s
Reg peak vel: 262 cm/s
TAPSE: 15.8 mm
TR max vel: 262 cm/s

## 2018-01-02 ENCOUNTER — Telehealth: Payer: Self-pay | Admitting: *Deleted

## 2018-01-02 NOTE — Telephone Encounter (Signed)
Notes recorded by Laurine Blazer, LPN on 5/83/1674 at 25:52 AM EST Patient notified. Copy sent to pmd & Dr. Susa Day. ------  Notes recorded by Herminio Commons, MD on 12/30/2017 at 10:20 AM EST Normal cardiac function.

## 2018-01-05 ENCOUNTER — Ambulatory Visit: Payer: Self-pay | Admitting: Orthopedic Surgery

## 2018-01-05 NOTE — H&P (View-Only) (Signed)
HISAKO BUGH is an 67 y.o. female.   Chief Complaint: R shoulder pain HPI: The patient is a 67 year old female who presents today for follow up of their shoulder. The patient is being followed for their right shoulder pain. They are 5 day(s) out from a fall and 8 years out from RCR. Symptoms reported today include: pain. and report their pain level to be 6 / 10. Current treatment includes: activity modification and pain medications. The following medication has been used for pain control: Cyclobenzaprine. The patient presents today following a visit with Ronette Deter, PA-C with Dr. Rolena Infante and an MRI.  Note:Kyrstin follows up today for her right shoulder, she was referred back to [place] by Dr. Rolena Infante. She has had 2 rotator cuff repairs done by Dr. Tonita Cong in the past, the last was 8 years ago, they were both side to side repairs. Reports her shoulder had been doing well up until 5 days ago when she had a fall. She was at a Christmas program for her granddaughter, was walking through the parking lot in the dark and of the sidewalk had a dip in it that she did not see, she went down and landed on her right shoulder and also struck the top of her head on the left side. She was initially seen in the emergency room, had x-rays and a CAT scan which were negative, was seen by Asencion Partridge an MRI was ordered. She is been taking tramadol and cyclobenzaprine, feels as though they are helping somewhat the pain. Reports pain is the worst at the shoulder itself but radiates into the axilla and down the arm. She denies numbness or tingling. Reports limited range of motion, weakness and heaviness of the right arm the injury. She did well following her rotator cuff repairs in the past.  Past Medical History:  Diagnosis Date  . Adrenal adenoma    left side   . Allergic rhinitis   . 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 Anxiety and depression   . Asthmatic bronchitis   . Bilateral carpal tunnel syndrome   . Condyloma acuminata   .  Diabetes mellitus without complication ([place])   . Diverticulosis   . Eczema    STABLE   . GERD (gastroesophageal reflux disease)   . H. pylori infection 11/16/1993  . Hiatal hernia   . History of benign adrenal tumor 2007  . History of benign adrenal tumor [facility]   [facility]   . History of pneumonia 2007, 2008  . Hyperlipidemia   . Hypertension   . IBS (irritable bowel syndrome)   . Internal hemorrhoids   . Kyphosis   . Memory loss   . Metabolic syndrome   . Otitis media, serous, acute   . Sleep apnea   . Symptomatic menopausal or female climacteric states   . Vitamin D deficiency     Past Surgical History:  Procedure Laterality Date  . abdominal wall lematoma of fabdd hys.    . APPENDECTOMY    . CARPAL TUNNEL RELEASE    . CESAREAN SECTION     [place]    . right breast biopsy  10/96  . ROTATOR CUFF REPAIR  2002   right  . ROTATOR CUFF REPAIR/ bone spur  08/30/09  . sleep apnea surgery  5/96  . torn cartiledge rt knee x2  05/13/06   Dr. Charlotta Newton   . TOTAL ABDOMINAL HYSTERECTOMY W/ BILATERAL SALPINGOOPHORECTOMY  8/94   fibroids  Family History  Problem Relation Age of Onset  . Parkinsonism Father   . Emphysema Father   . Hip fracture Father   . Heart disease Mother   . COPD Brother   . Hypertension Brother   . Heart disease Brother   . Heart failure Sister   . Ovarian cancer Sister   . COPD Sister   . Lymphoma Sister   . COPD Sister    Social History:  reports that  has never smoked. she has never used smokeless tobacco. She reports that she does not drink alcohol or use drugs.  Allergies:  Allergies  Allergen Reactions  . Statins Other (See Comments)    myalgias  . Sulfa Antibiotics Other (See Comments)    Emycin, VIOX, ERT Causes severe stomach cramps      (Not in a hospital admission)  No results found for this or any previous visit (from the past 48 hour(s)). No results found.  Review of Systems  Constitutional: Negative.    HENT: Negative.   Eyes: Negative.   Respiratory: Negative.   Cardiovascular: Negative.   Gastrointestinal: Negative.   Genitourinary: Negative.   Musculoskeletal: Positive for joint pain.  Skin: Negative.   Neurological: Negative.     There were no vitals taken for this visit. Physical Exam  Constitutional: She is oriented to person, place, and time. She appears well-developed.  HENT:  Head: Normocephalic.  Eyes: Pupils are equal, round, and reactive to light.  Neck: Normal range of motion.  Cardiovascular: Normal rate.  Respiratory: Effort normal.  GI: Soft.  Musculoskeletal:  Patient is awake, alert, and oriented 3. In no acute distress, pleasant. Well-nourished and well-developed.  On examination of the shoulder, tender on palpation of the subacromial space. Nontender AC joint, [place] joint, clavicle, medial scapular border and trapezius. Positive impingement and secondary impingement testing. Forward flexion and abduction both to only 90 actively, moderately decreased internal rotation. No instability noted; negative sulcus sign. Weakness noted with external rotation strength testing, 4/5, as well as with abduction against resistance trace weakness, otherwise 5/5. Sensation intact distally. Cervical spine with normal range of motion and no tenderness.   Neurological: She is alert and oriented to person, place, and time.  Skin: Skin is warm and dry.    X-rays on the cone system reviewed today by Dr. Tonita Cong, negative for fracture. MRI images and report reviewed today by Dr. Tonita Cong with a full-thickness tear of the supraspinatus tendon 12 mm from the greater tuberosity, 12 x 10 mm, mild diffuse supraspinatus atrophy. Strain of the subscap and infraspinatus, minimal insertional tearing with tendinosis of the subscap. Also noted: Degenerative labral tearing, OCD lesion of the posterior glenoid, bicipital spurring, downsloping acromion, bursitis.  Assessment/Plan Acute right shoulder  pain status post a fall 5 days ago, recurrent rotator cuff tear  Plan: We discussed relevant anatomy and etiology of her pain, reviewed MRI findings, x-rays and discussed treatment options. Given that she has a recurrent full-thickness tear would recommend proceeding with mini open rotator cuff repair and subacromial decompression on the right shoulder. We discussed the procedure itself as well as risks, complications, and alternatives including but not limited to DVT, PE, failure of procedure, need for secondary procedure, anesthesia risk, even death. We discussed postop protocol, time in a sling, need for physical therapy, activity restrictions and modifications. She tells me that she has had 2 episodes of DVT in the past, however upon further discussion this actually sounds more like hematoma status post her C-section as  well as status post a hernia surgery. Neither of these required any further intervention. She takes aspirin, no other blood thinners. I have prescribed Norco and tizanidine instead of tramadol and Flexeril, for better pain control as well as to avoid the possible serotonin syndrome that could be perpetuated with those on top of her amitriptyline and venlafaxine. We will obtain clearance from her PCP, Dr. Redge Gainer, letter was given to her today. Once we receive that clearance back we can proceed accordingly with scheduling right shoulder mini open rotator cuff repair and subacromial decompression. We did also discuss possibility of head injury, warning signs to look for that would make her concerned and would recommend presenting to the emergency room with those. Patient was seen in conjunction with Dr. Tonita Cong today. She will follow-up postoperatively for suture removal and call questions or concerns in the interim.  Plan R shoulder mini-open RCR, SAD  ,  M., PA-C for Dr. Tonita Cong 01/05/2018, 11:44 AM

## 2018-01-05 NOTE — H&P (Signed)
Melissa Bowman is an 67 y.o. female.   Chief Complaint: R shoulder pain HPI: The patient is a 67 year old female who presents today for follow up of their shoulder. The patient is being followed for their right shoulder pain. They are 5 day(s) out from a fall and 8 years out from RCR. Symptoms reported today include: pain. and report their pain level to be 6 / 10. Current treatment includes: activity modification and pain medications. The following medication has been used for pain control: Cyclobenzaprine. The patient presents today following a visit with Ronette Deter, PA-C with Dr. Rolena Infante and an MRI.  Note:Melissa Bowman follows up today for her right shoulder, she was referred back to [place] by Dr. Rolena Infante. She has had 2 rotator cuff repairs done by Dr. Tonita Cong in the past, the last was 8 years ago, they were both side to side repairs. Reports her shoulder had been doing well up until 5 days ago when she had a fall. She was at a Christmas program for her granddaughter, was walking through the parking lot in the dark and of the sidewalk had a dip in it that she did not see, she went down and landed on her right shoulder and also struck the top of her head on the left side. She was initially seen in the emergency room, had x-rays and a CAT scan which were negative, was seen by Asencion Partridge an MRI was ordered. She is been taking tramadol and cyclobenzaprine, feels as though they are helping somewhat the pain. Reports pain is the worst at the shoulder itself but radiates into the axilla and down the arm. She denies numbness or tingling. Reports limited range of motion, weakness and heaviness of the right arm the injury. She did well following her rotator cuff repairs in the past.  Past Medical History:  Diagnosis Date  . Adrenal adenoma    left side   . Allergic rhinitis   . 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 Anxiety and depression   . Asthmatic bronchitis   . Bilateral carpal tunnel syndrome   . Condyloma acuminata   .  Diabetes mellitus without complication ([place])   . Diverticulosis   . Eczema    STABLE   . GERD (gastroesophageal reflux disease)   . H. pylori infection 11/16/1993  . Hiatal hernia   . History of benign adrenal tumor 2007  . History of benign adrenal tumor [facility]   [facility]   . History of pneumonia 2007, 2008  . Hyperlipidemia   . Hypertension   . IBS (irritable bowel syndrome)   . Internal hemorrhoids   . Kyphosis   . Memory loss   . Metabolic syndrome   . Otitis media, serous, acute   . Sleep apnea   . Symptomatic menopausal or female climacteric states   . Vitamin D deficiency     Past Surgical History:  Procedure Laterality Date  . abdominal wall lematoma of fabdd hys.    . APPENDECTOMY    . CARPAL TUNNEL RELEASE    . CESAREAN SECTION     [place]    . right breast biopsy  10/96  . ROTATOR CUFF REPAIR  2002   right  . ROTATOR CUFF REPAIR/ bone spur  08/30/09  . sleep apnea surgery  5/96  . torn cartiledge rt knee x2  05/13/06   Dr. Charlotta Newton   . TOTAL ABDOMINAL HYSTERECTOMY W/ BILATERAL SALPINGOOPHORECTOMY  8/94   fibroids  Family History  Problem Relation Age of Onset  . Parkinsonism Father   . Emphysema Father   . Hip fracture Father   . Heart disease Mother   . COPD Brother   . Hypertension Brother   . Heart disease Brother   . Heart failure Sister   . Ovarian cancer Sister   . COPD Sister   . Lymphoma Sister   . COPD Sister    Social History:  reports that  has never smoked. she has never used smokeless tobacco. She reports that she does not drink alcohol or use drugs.  Allergies:  Allergies  Allergen Reactions  . Statins Other (See Comments)    myalgias  . Sulfa Antibiotics Other (See Comments)    Emycin, VIOX, ERT Causes severe stomach cramps      (Not in a hospital admission)  No results found for this or any previous visit (from the past 48 hour(s)). No results found.  Review of Systems  Constitutional: Negative.    HENT: Negative.   Eyes: Negative.   Respiratory: Negative.   Cardiovascular: Negative.   Gastrointestinal: Negative.   Genitourinary: Negative.   Musculoskeletal: Positive for joint pain.  Skin: Negative.   Neurological: Negative.     There were no vitals taken for this visit. Physical Exam  Constitutional: She is oriented to person, place, and time. She appears well-developed.  HENT:  Head: Normocephalic.  Eyes: Pupils are equal, round, and reactive to light.  Neck: Normal range of motion.  Cardiovascular: Normal rate.  Respiratory: Effort normal.  GI: Soft.  Musculoskeletal:  Patient is awake, alert, and oriented 3. In no acute distress, pleasant. Well-nourished and well-developed.  On examination of the shoulder, tender on palpation of the subacromial space. Nontender AC joint, [place] joint, clavicle, medial scapular border and trapezius. Positive impingement and secondary impingement testing. Forward flexion and abduction both to only 90 actively, moderately decreased internal rotation. No instability noted; negative sulcus sign. Weakness noted with external rotation strength testing, 4/5, as well as with abduction against resistance trace weakness, otherwise 5/5. Sensation intact distally. Cervical spine with normal range of motion and no tenderness.   Neurological: She is alert and oriented to person, place, and time.  Skin: Skin is warm and dry.    X-rays on the cone system reviewed today by Dr. Tonita Cong, negative for fracture. MRI images and report reviewed today by Dr. Tonita Cong with a full-thickness tear of the supraspinatus tendon 12 mm from the greater tuberosity, 12 x 10 mm, mild diffuse supraspinatus atrophy. Strain of the subscap and infraspinatus, minimal insertional tearing with tendinosis of the subscap. Also noted: Degenerative labral tearing, OCD lesion of the posterior glenoid, bicipital spurring, downsloping acromion, bursitis.  Assessment/Plan Acute right shoulder  pain status post a fall 5 days ago, recurrent rotator cuff tear  Plan: We discussed relevant anatomy and etiology of her pain, reviewed MRI findings, x-rays and discussed treatment options. Given that she has a recurrent full-thickness tear would recommend proceeding with mini open rotator cuff repair and subacromial decompression on the right shoulder. We discussed the procedure itself as well as risks, complications, and alternatives including but not limited to DVT, PE, failure of procedure, need for secondary procedure, anesthesia risk, even death. We discussed postop protocol, time in a sling, need for physical therapy, activity restrictions and modifications. She tells me that she has had 2 episodes of DVT in the past, however upon further discussion this actually sounds more like hematoma status post her C-section as  well as status post a hernia surgery. Neither of these required any further intervention. She takes aspirin, no other blood thinners. I have prescribed Norco and tizanidine instead of tramadol and Flexeril, for better pain control as well as to avoid the possible serotonin syndrome that could be perpetuated with those on top of her amitriptyline and venlafaxine. We will obtain clearance from her PCP, Dr. Redge Gainer, letter was given to her today. Once we receive that clearance back we can proceed accordingly with scheduling right shoulder mini open rotator cuff repair and subacromial decompression. We did also discuss possibility of head injury, warning signs to look for that would make her concerned and would recommend presenting to the emergency room with those. Patient was seen in conjunction with Dr. Tonita Cong today. She will follow-up postoperatively for suture removal and call questions or concerns in the interim.  Plan R shoulder mini-open RCR, SAD  BISSELL, JACLYN M., PA-C for Dr. Tonita Cong 01/05/2018, 11:44 AM

## 2018-01-05 NOTE — Progress Notes (Signed)
Please place orders in Epic as patient is being scheduled for a pre-op appointment! Thank you! 

## 2018-01-10 NOTE — Patient Instructions (Addendum)
Melissa Bowman  01/10/2018   Your procedure is scheduled on: Thursday 01-19-18  Report to [facility] Main  Entrance  Follow signs to Short Stay on first floor at 530 AM   Call this number if you have problems the morning of surgery [phone]    Remember: Do not eat food or drink liquids :After Midnight.    BRING YOUR CPAP MASK AND TUBING WITH YOU TO [place]!   Take these medicines the morning of surgery with A SIP OF WATER: SINGULAIR, PROAIR INHALER AND SYMBICORT INHALERS IF NEEDED AND BRING INHALERS, FENOFIBFATE, AMLODIPINE (NORVASC), OMEPRAZOLE (PRILOSEC), VENLAFAXINE (EFFEXOR)  DO NOT TAKE ANY DIABETIC MEDICATIONS DAY OF YOUR SURGERY                               You may not have any metal on your body including hair pins and              piercings  Do not wear jewelry, make-up, lotions, powders or perfumes, deodorant             Do not wear nail polish.  Do not shave  48 hours prior to surgery.                Do not bring valuables to the hospital. [place].  Contacts, dentures or bridgework may not be worn into surgery.  Leave suitcase in the car. After surgery it may be brought to your room.                  Please read over the following fact sheets you were given: _____________________________________________________________________             How to Manage Your Diabetes Before and After Surgery  Why is it important to control my blood sugar before and after surgery? . Improving blood sugar levels before and after surgery helps healing and can limit problems. . A way of improving blood sugar control is eating a healthy diet by: o  Eating less sugar and carbohydrates o  Increasing activity/exercise o  Talking with your doctor about reaching your blood sugar goals . High blood sugars (greater than 180 mg/dL) can raise your risk of infections and slow your recovery, so  you will need to focus on controlling your diabetes during the weeks before surgery. . Make sure that the doctor who takes care of your diabetes knows about your planned surgery including the date and location.  How do I manage my blood sugar before surgery? . Check your blood sugar at least 4 times a day, starting 2 days before surgery, to make sure that the level is not too high or low. o Check your blood sugar the morning of your surgery when you wake up and every 2 hours until you get to the Short Stay unit. . If your blood sugar is less than 70 mg/dL, you will need to treat for low blood sugar: o Do not take insulin. o Treat a low blood sugar (less than 70 mg/dL) with  cup of clear juice (cranberry or apple), 4 glucose tablets, OR glucose gel. o Recheck blood sugar in 15  minutes after treatment (to make sure it is greater than 70 mg/dL). If your blood sugar is not greater than 70 mg/dL on recheck, call [phone] for further instructions. . Report your blood sugar to the short stay nurse when you get to Short Stay.  . If you are admitted to the hospital after surgery: o Your blood sugar will be checked by the staff and you will probably be given insulin after surgery (instead of oral diabetes medicines) to make sure you have good blood sugar levels. o The goal for blood sugar control after surgery is 80-180 mg/dL.   WHAT DO I DO ABOUT MY DIABETES MEDICATION?  Marland Kitchen Do not take oral diabetes medicines (pills) the morning of surgery.  . THE DAY BEFORE SURGERY 01-18-17 TAKE YOUR METFORMIN AS USUAL.       . THE MORNING OF SURGERY,  01-19-17 DO NOT [place] - Preparing for Surgery Before surgery, you can play an important role.  Because skin is not sterile, your skin needs to be as free of germs as possible.  You can reduce the number of germs on your skin by washing with CHG (chlorahexidine gluconate) soap before surgery.  CHG is an antiseptic cleaner which kills germs  and bonds with the skin to continue killing germs even after washing. Please DO NOT use if you have an allergy to CHG or antibacterial soaps.  If your skin becomes reddened/irritated stop using the CHG and inform your nurse when you arrive at Short Stay. Do not shave (including legs and underarms) for at least 48 hours prior to the first CHG shower.  You may shave your face/neck. Please follow these instructions carefully:  1.  Shower with CHG Soap the night before surgery and the  morning of Surgery.  2.  If you choose to wash your hair, wash your hair first as usual with your  normal  shampoo.  3.  After you shampoo, rinse your hair and body thoroughly to remove the  shampoo.                           4.  Use CHG as you would any other liquid soap.  You can apply chg directly  to the skin and wash                       Gently with a scrungie or clean washcloth.  5.  Apply the CHG Soap to your body ONLY FROM THE NECK DOWN.   Do not use on face/ open                           Wound or open sores. Avoid contact with eyes, ears mouth and genitals (private parts).                       Wash face,  Genitals (private parts) with your normal soap.             6.  Wash thoroughly, paying special attention to the area where your surgery  will be performed.  7.  Thoroughly rinse your body with warm water from the neck down.  8.  DO NOT shower/wash with your normal soap after using and rinsing off  the CHG Soap.                9.  Fraser Din  yourself dry with a clean towel.            10.  Wear clean pajamas.            11.  Place clean sheets on your bed the night of your first shower and do not  sleep with pets. Day of Surgery : Do not apply any lotions/deodorants the morning of surgery.  Please wear clean clothes to the hospital/surgery center.  FAILURE TO FOLLOW THESE INSTRUCTIONS MAY RESULT IN THE CANCELLATION OF YOUR SURGERY PATIENT SIGNATURE_________________________________  NURSE  SIGNATURE__________________________________  ________________________________________________________________________   [name]  An incentive spirometer is a tool that can help keep your lungs clear and active. This tool measures how well you are filling your lungs with each breath. Taking long deep breaths may help reverse or decrease the chance of developing breathing (pulmonary) problems (especially infection) following:  A long period of time when you are unable to move or be active. BEFORE THE PROCEDURE   If the spirometer includes an indicator to show your best effort, your nurse or respiratory therapist will set it to a desired goal.  If possible, sit up straight or lean slightly forward. Try not to slouch.  Hold the incentive spirometer in an upright position. INSTRUCTIONS FOR USE  1. Sit on the edge of your bed if possible, or sit up as far as you can in bed or on a chair. 2. Hold the incentive spirometer in an upright position. 3. Breathe out normally. 4. Place the mouthpiece in your mouth and seal your lips tightly around it. 5. Breathe in slowly and as deeply as possible, raising the piston or the ball toward the top of the column. 6. Hold your breath for 3-5 seconds or for as long as possible. Allow the piston or ball to fall to the bottom of the column. 7. Remove the mouthpiece from your mouth and breathe out normally. 8. Rest for a few seconds and repeat Steps 1 through 7 at least 10 times every 1-2 hours when you are awake. Take your time and take a few normal breaths between deep breaths. 9. The spirometer may include an indicator to show your best effort. Use the indicator as a goal to work toward during each repetition. 10. After each set of 10 deep breaths, practice coughing to be sure your lungs are clear. If you have an incision (the cut made at the time of surgery), support your incision when coughing by placing a pillow or rolled up towels firmly  against it. Once you are able to get out of bed, walk around indoors and cough well. You may stop using the incentive spirometer when instructed by your caregiver.  RISKS AND COMPLICATIONS  Take your time so you do not get dizzy or light-headed.  If you are in pain, you may need to take or ask for pain medication before doing incentive spirometry. It is harder to take a deep breath if you are having pain. AFTER USE  Rest and breathe slowly and easily.  It can be helpful to keep track of a log of your progress. Your caregiver can provide you with a simple table to help with this. If you are using the spirometer at home, follow these instructions: [place] IF:   You are having difficultly using the spirometer.  You have trouble using the spirometer as often as instructed.  Your pain medication is not giving enough relief while using the spirometer.  You develop fever of  100.5 F (38.1 C) or higher. SEEK IMMEDIATE MEDICAL CARE IF:   You cough up bloody sputum that had not been present before.  You develop fever of 102 F (38.9 C) or greater.  You develop worsening pain at or near the incision site. MAKE SURE YOU:   Understand these instructions.  Will watch your condition.  Will get help right away if you are not doing well or get worse. Document Released: 04/11/2007 Document Revised: 02/21/2012 Document Reviewed: 06/12/2007 [facility] Patient Information 2014 [place], [place].   ________________________________________________________________________

## 2018-01-12 ENCOUNTER — Encounter (HOSPITAL_COMMUNITY)
Admission: RE | Admit: 2018-01-12 | Discharge: 2018-01-12 | Disposition: A | Discharge status: Home / Self Care | Payer: Medicare Other | Source: Ambulatory Visit | Attending: Specialist | Admitting: Specialist

## 2018-01-12 ENCOUNTER — Encounter (HOSPITAL_COMMUNITY): Payer: Self-pay

## 2018-01-12 ENCOUNTER — Other Ambulatory Visit: Payer: Self-pay

## 2018-01-12 DIAGNOSIS — M75101 Unspecified rotator cuff tear or rupture of right shoulder, not specified as traumatic: Secondary | ICD-10-CM | POA: Insufficient documentation

## 2018-01-12 DIAGNOSIS — Z01812 Encounter for preprocedural laboratory examination: Secondary | ICD-10-CM | POA: Diagnosis not present

## 2018-01-12 LAB — BASIC METABOLIC PANEL
ANION GAP: 10 (ref 5–15)
BUN: 16 mg/dL (ref 6–20)
CHLORIDE: 102 mmol/L (ref 101–111)
CO2: 26 mmol/L (ref 22–32)
Calcium: 9.9 mg/dL (ref 8.9–10.3)
Creatinine, Ser: 0.61 mg/dL (ref 0.44–1.00)
GFR calc Af Amer: >60 mL/min (ref >60)
GFR calc non Af Amer: >60 mL/min (ref >60)
Glucose, Bld: 126 mg/dL — ABNORMAL HIGH (ref 65–99)
POTASSIUM: 4 mmol/L (ref 3.5–5.1)
SODIUM: 138 mmol/L (ref 135–145)

## 2018-01-12 LAB — CBC
HEMATOCRIT: 39.9 % (ref 36.0–46.0)
HEMOGLOBIN: 13.6 g/dL (ref 12.0–15.0)
MCH: 30.8 pg (ref 26.0–34.0)
MCHC: 34.1 g/dL (ref 30.0–36.0)
MCV: 90.5 fL (ref 78.0–100.0)
Platelets: 270 10*3/uL (ref 150–400)
RBC: 4.41 MIL/uL (ref 3.87–5.11)
RDW: 13.4 % (ref 11.5–15.5)
WBC: 6.6 10*3/uL (ref 4.0–10.5)

## 2018-01-12 LAB — GLUCOSE, CAPILLARY: Glucose-Capillary: 151 mg/dL — ABNORMAL HIGH (ref 65–99)

## 2018-01-12 LAB — HEMOGLOBIN A1C
Hgb A1c MFr Bld: 6.3 % — ABNORMAL HIGH (ref 4.8–5.6)
Mean Plasma Glucose: 134.11 mg/dL

## 2018-01-16 ENCOUNTER — Other Ambulatory Visit: Payer: Self-pay | Admitting: *Deleted

## 2018-01-16 MED ORDER — LOSARTAN POTASSIUM-HCTZ 100-25 MG PO TABS: 1.0000 | ORAL_TABLET | Freq: Every day | ORAL | 0 refills | Status: DC

## 2018-01-19 ENCOUNTER — Encounter (HOSPITAL_COMMUNITY)
Admission: RE | Disposition: A | Discharge status: Home / Self Care | Payer: Self-pay | Source: Ambulatory Visit | Attending: Specialist

## 2018-01-19 ENCOUNTER — Encounter (HOSPITAL_COMMUNITY): Payer: Self-pay

## 2018-01-19 ENCOUNTER — Ambulatory Visit (HOSPITAL_COMMUNITY): Payer: Medicare Other | Admitting: Certified Registered Nurse Anesthetist

## 2018-01-19 ENCOUNTER — Ambulatory Visit (HOSPITAL_COMMUNITY)
Admission: RE | Admit: 2018-01-19 | Discharge: 2018-01-19 | Disposition: A | Discharge status: Home / Self Care | Payer: Medicare Other | Source: Ambulatory Visit | Attending: Specialist | Admitting: Specialist

## 2018-01-19 DIAGNOSIS — Z7984 Long term (current) use of oral hypoglycemic drugs: Secondary | ICD-10-CM | POA: Insufficient documentation

## 2018-01-19 DIAGNOSIS — K589 Irritable bowel syndrome without diarrhea: Secondary | ICD-10-CM | POA: Insufficient documentation

## 2018-01-19 DIAGNOSIS — F418 Other specified anxiety disorders: Secondary | ICD-10-CM | POA: Insufficient documentation

## 2018-01-19 DIAGNOSIS — Z86718 Personal history of other venous thrombosis and embolism: Secondary | ICD-10-CM | POA: Insufficient documentation

## 2018-01-19 DIAGNOSIS — Z79899 Other long term (current) drug therapy: Secondary | ICD-10-CM | POA: Insufficient documentation

## 2018-01-19 DIAGNOSIS — Y929 Unspecified place or not applicable: Secondary | ICD-10-CM | POA: Insufficient documentation

## 2018-01-19 DIAGNOSIS — M19011 Primary osteoarthritis, right shoulder: Secondary | ICD-10-CM | POA: Diagnosis not present

## 2018-01-19 DIAGNOSIS — K219 Gastro-esophageal reflux disease without esophagitis: Secondary | ICD-10-CM | POA: Diagnosis not present

## 2018-01-19 DIAGNOSIS — G5603 Carpal tunnel syndrome, bilateral upper limbs: Secondary | ICD-10-CM | POA: Insufficient documentation

## 2018-01-19 DIAGNOSIS — S46011A Strain of muscle(s) and tendon(s) of the rotator cuff of right shoulder, initial encounter: Secondary | ICD-10-CM | POA: Diagnosis not present

## 2018-01-19 DIAGNOSIS — E559 Vitamin D deficiency, unspecified: Secondary | ICD-10-CM | POA: Diagnosis not present

## 2018-01-19 DIAGNOSIS — E119 Type 2 diabetes mellitus without complications: Secondary | ICD-10-CM | POA: Diagnosis not present

## 2018-01-19 DIAGNOSIS — S46091A Other injury of muscle(s) and tendon(s) of the rotator cuff of right shoulder, initial encounter: Secondary | ICD-10-CM | POA: Diagnosis not present

## 2018-01-19 DIAGNOSIS — M75101 Unspecified rotator cuff tear or rupture of right shoulder, not specified as traumatic: Secondary | ICD-10-CM

## 2018-01-19 DIAGNOSIS — W19XXXA Unspecified fall, initial encounter: Secondary | ICD-10-CM | POA: Insufficient documentation

## 2018-01-19 DIAGNOSIS — E785 Hyperlipidemia, unspecified: Secondary | ICD-10-CM | POA: Diagnosis not present

## 2018-01-19 DIAGNOSIS — Z7982 Long term (current) use of aspirin: Secondary | ICD-10-CM | POA: Insufficient documentation

## 2018-01-19 DIAGNOSIS — G8918 Other acute postprocedural pain: Secondary | ICD-10-CM | POA: Diagnosis not present

## 2018-01-19 DIAGNOSIS — I10 Essential (primary) hypertension: Secondary | ICD-10-CM | POA: Diagnosis not present

## 2018-01-19 HISTORY — PX: SHOULDER ARTHROSCOPY WITH ROTATOR CUFF REPAIR AND SUBACROMIAL DECOMPRESSION: SHX5686

## 2018-01-19 LAB — GLUCOSE, CAPILLARY: Glucose-Capillary: 134 mg/dL — ABNORMAL HIGH (ref 65–99)

## 2018-01-19 SURGERY — SHOULDER ARTHROSCOPY WITH ROTATOR CUFF REPAIR AND SUBACROMIAL DECOMPRESSION
Anesthesia: General | Site: Shoulder | Laterality: Right

## 2018-01-19 MED ORDER — LIDOCAINE 2% (20 MG/ML) 5 ML SYRINGE
INTRAMUSCULAR | Status: AC
  Filled 2018-01-19: qty 5

## 2018-01-19 MED ORDER — HYDROMORPHONE HCL 1 MG/ML IJ SOLN: 0.2500 mg | INTRAMUSCULAR | Status: DC | PRN

## 2018-01-19 MED ORDER — DOCUSATE SODIUM 100 MG PO CAPS: 100.0000 mg | ORAL_CAPSULE | Freq: Two times a day (BID) | ORAL | 2 refills | Status: DC

## 2018-01-19 MED ORDER — PROPOFOL 10 MG/ML IV BOLUS
INTRAVENOUS | Status: DC | PRN
  Administered 2018-01-19: 100 mg via INTRAVENOUS
  Administered 2018-01-19: 50 mg via INTRAVENOUS

## 2018-01-19 MED ORDER — MIDAZOLAM HCL 5 MG/5ML IJ SOLN
INTRAMUSCULAR | Status: DC | PRN
  Administered 2018-01-19: 2 mg via INTRAVENOUS

## 2018-01-19 MED ORDER — CHLORHEXIDINE GLUCONATE 4 % EX LIQD: 60.0000 mL | Freq: Once | CUTANEOUS | Status: DC

## 2018-01-19 MED ORDER — PROPOFOL 10 MG/ML IV BOLUS
INTRAVENOUS | Status: AC
  Filled 2018-01-19: qty 20

## 2018-01-19 MED ORDER — LACTATED RINGERS IV SOLN
INTRAVENOUS | Status: DC
  Administered 2018-01-19: 07:00:00 via INTRAVENOUS

## 2018-01-19 MED ORDER — DEXAMETHASONE SODIUM PHOSPHATE 10 MG/ML IJ SOLN
INTRAMUSCULAR | Status: AC
  Filled 2018-01-19: qty 1

## 2018-01-19 MED ORDER — BUPIVACAINE-EPINEPHRINE (PF) 0.5% -1:200000 IJ SOLN
INTRAMUSCULAR | Status: DC | PRN
  Administered 2018-01-19: 30 mL via PERINEURAL

## 2018-01-19 MED ORDER — SUGAMMADEX SODIUM 200 MG/2ML IV SOLN
INTRAVENOUS | Status: DC | PRN
  Administered 2018-01-19: 200 mg via INTRAVENOUS

## 2018-01-19 MED ORDER — DEXAMETHASONE SODIUM PHOSPHATE 10 MG/ML IJ SOLN
INTRAMUSCULAR | Status: DC | PRN
  Administered 2018-01-19: 10 mg via INTRAVENOUS

## 2018-01-19 MED ORDER — CEFAZOLIN SODIUM-DEXTROSE 2-4 GM/100ML-% IV SOLN
2.0000 g | INTRAVENOUS | Status: AC
  Administered 2018-01-19: 2 g via INTRAVENOUS
  Filled 2018-01-19: qty 100

## 2018-01-19 MED ORDER — TRAMADOL HCL 50 MG PO TABS: 50.0000 mg | ORAL_TABLET | Freq: Four times a day (QID) | ORAL | 1 refills | Status: DC | PRN

## 2018-01-19 MED ORDER — KETOROLAC TROMETHAMINE 10 MG PO TABS: 10.0000 mg | ORAL_TABLET | Freq: Four times a day (QID) | ORAL | 0 refills | Status: DC | PRN

## 2018-01-19 MED ORDER — ROCURONIUM BROMIDE 50 MG/5ML IV SOSY
PREFILLED_SYRINGE | INTRAVENOUS | Status: DC | PRN
  Administered 2018-01-19: 50 mg via INTRAVENOUS

## 2018-01-19 MED ORDER — SUCCINYLCHOLINE CHLORIDE 200 MG/10ML IV SOSY
PREFILLED_SYRINGE | INTRAVENOUS | Status: AC
  Filled 2018-01-19: qty 10

## 2018-01-19 MED ORDER — PHENYLEPHRINE HCL 10 MG/ML IJ SOLN
INTRAMUSCULAR | Status: AC
  Filled 2018-01-19: qty 1

## 2018-01-19 MED ORDER — LIDOCAINE 2% (20 MG/ML) 5 ML SYRINGE
INTRAMUSCULAR | Status: DC | PRN
  Administered 2018-01-19: 100 mg via INTRAVENOUS

## 2018-01-19 MED ORDER — ROCURONIUM BROMIDE 10 MG/ML (PF) SYRINGE
PREFILLED_SYRINGE | INTRAVENOUS | Status: AC
  Filled 2018-01-19: qty 5

## 2018-01-19 MED ORDER — PHENYLEPHRINE HCL 10 MG/ML IJ SOLN
INTRAVENOUS | Status: DC | PRN
  Administered 2018-01-19: 40 ug/min via INTRAVENOUS

## 2018-01-19 MED ORDER — SUGAMMADEX SODIUM 200 MG/2ML IV SOLN
INTRAVENOUS | Status: AC
  Filled 2018-01-19: qty 2

## 2018-01-19 MED ORDER — ONDANSETRON HCL 4 MG/2ML IJ SOLN
INTRAMUSCULAR | Status: DC | PRN
  Administered 2018-01-19: 4 mg via INTRAVENOUS

## 2018-01-19 MED ORDER — BUPIVACAINE-EPINEPHRINE (PF) 0.5% -1:200000 IJ SOLN
INTRAMUSCULAR | Status: AC
  Filled 2018-01-19: qty 30

## 2018-01-19 MED ORDER — FENTANYL CITRATE (PF) 250 MCG/5ML IJ SOLN
INTRAMUSCULAR | Status: AC
  Filled 2018-01-19: qty 5

## 2018-01-19 MED ORDER — SODIUM CHLORIDE 0.9 % IV SOLN
INTRAVENOUS | Status: AC
  Filled 2018-01-19: qty 500000

## 2018-01-19 MED ORDER — MEPERIDINE HCL 50 MG/ML IJ SOLN: 6.2500 mg | INTRAMUSCULAR | Status: DC | PRN

## 2018-01-19 MED ORDER — KETOROLAC TROMETHAMINE 30 MG/ML IJ SOLN
INTRAMUSCULAR | Status: DC | PRN
  Administered 2018-01-19: 15 mg via INTRAVENOUS

## 2018-01-19 MED ORDER — CEFAZOLIN SODIUM-DEXTROSE 2-4 GM/100ML-% IV SOLN: 2.0000 g | Freq: Once | INTRAVENOUS | Status: DC

## 2018-01-19 MED ORDER — FENTANYL CITRATE (PF) 100 MCG/2ML IJ SOLN
INTRAMUSCULAR | Status: DC | PRN
  Administered 2018-01-19 (×3): 50 ug via INTRAVENOUS

## 2018-01-19 MED ORDER — ONDANSETRON HCL 4 MG/2ML IJ SOLN
INTRAMUSCULAR | Status: AC
  Filled 2018-01-19: qty 2

## 2018-01-19 MED ORDER — MIDAZOLAM HCL 2 MG/2ML IJ SOLN
INTRAMUSCULAR | Status: AC
  Filled 2018-01-19: qty 2

## 2018-01-19 MED ORDER — SODIUM CHLORIDE 0.9 % IV SOLN
INTRAVENOUS | Status: DC | PRN
  Administered 2018-01-19: 500 mL

## 2018-01-19 MED ORDER — BUPIVACAINE-EPINEPHRINE 0.5% -1:200000 IJ SOLN
INTRAMUSCULAR | Status: DC | PRN
  Administered 2018-01-19: 10 mL

## 2018-01-19 MED ORDER — ONDANSETRON HCL 4 MG/2ML IJ SOLN: 4.0000 mg | Freq: Once | INTRAMUSCULAR | Status: DC | PRN

## 2018-01-19 MED ORDER — KETOROLAC TROMETHAMINE 30 MG/ML IJ SOLN
INTRAMUSCULAR | Status: AC
  Filled 2018-01-19: qty 1

## 2018-01-19 SURGICAL SUPPLY — 56 items
ANCH SUT SWLK 19.1X4.75 VT (Anchor) ×1 IMPLANT
ANCHOR NDL 9/16 CIR SZ 8 (NEEDLE) IMPLANT
ANCHOR NEEDLE 9/16 CIR SZ 8 (NEEDLE) IMPLANT
ANCHOR PEEK 4.75X19.1 SWLK C (Anchor) ×2 IMPLANT
BLADE CUDA SHAVER 3.5 (BLADE) ×1 IMPLANT
BLADE SURG SZ11 CARB STEEL (BLADE) ×1 IMPLANT
BUR OVAL 4.0 (BURR) IMPLANT
CANNULA ACUFO 5X76 (CANNULA) ×1 IMPLANT
CLEANER TIP ELECTROSURG 2X2 (MISCELLANEOUS) IMPLANT
CLOSURE WOUND 1/2 X4 (GAUZE/BANDAGES/DRESSINGS) ×1
CLOTH 2% CHLOROHEXIDINE 3PK (PERSONAL CARE ITEMS) ×3 IMPLANT
COVER SURGICAL LIGHT HANDLE (MISCELLANEOUS) ×3 IMPLANT
DRAPE ORTHO SPLIT 77X108 STRL (DRAPES) ×3
DRAPE POUCH INSTRU U-SHP 10X18 (DRAPES) IMPLANT
DRAPE STERI 35X30 U-POUCH (DRAPES) ×3 IMPLANT
DRAPE SURG ORHT 6 SPLT 77X108 (DRAPES) ×1 IMPLANT
DRSG AQUACEL AG ADV 3.5X 4 (GAUZE/BANDAGES/DRESSINGS) ×2 IMPLANT
DRSG AQUACEL AG ADV 3.5X 6 (GAUZE/BANDAGES/DRESSINGS) IMPLANT
DRSG PAD ABDOMINAL 8X10 ST (GAUZE/BANDAGES/DRESSINGS) IMPLANT
DURAPREP 26ML APPLICATOR (WOUND CARE) ×3 IMPLANT
ELECT NEEDLE TIP 2.8 STRL (NEEDLE) IMPLANT
ELECT REM PT RETURN 15FT ADLT (MISCELLANEOUS) ×3 IMPLANT
GLOVE BIOGEL PI IND STRL 7.0 (GLOVE) ×1 IMPLANT
GLOVE BIOGEL PI INDICATOR 7.0 (GLOVE) ×2
GLOVE SURG SS PI 7.0 STRL IVOR (GLOVE) ×3 IMPLANT
GLOVE SURG SS PI 7.5 STRL IVOR (GLOVE) ×3 IMPLANT
GLOVE SURG SS PI 8.0 STRL IVOR (GLOVE) ×6 IMPLANT
GOWN STRL REUS W/TWL XL LVL3 (GOWN DISPOSABLE) ×6 IMPLANT
KIT BASIN OR (CUSTOM PROCEDURE TRAY) ×3 IMPLANT
KIT POSITION SHOULDER SCHLEI (MISCELLANEOUS) ×3 IMPLANT
MANIFOLD NEPTUNE II (INSTRUMENTS) ×3 IMPLANT
NDL SCORPION MULTI FIRE (NEEDLE) IMPLANT
NDL SPNL 18GX3.5 QUINCKE PK (NEEDLE) ×1 IMPLANT
NEEDLE SCORPION MULTI FIRE (NEEDLE) ×3 IMPLANT
NEEDLE SPNL 18GX3.5 QUINCKE PK (NEEDLE) ×3 IMPLANT
PACK SHOULDER (CUSTOM PROCEDURE TRAY) ×3 IMPLANT
POSITIONER SURGICAL ARM (MISCELLANEOUS) ×3 IMPLANT
SLING ARM FOAM STRAP MED (SOFTGOODS) ×2 IMPLANT
SLING ARM IMMOBILIZER LRG (SOFTGOODS) IMPLANT
SLING ARM IMMOBILIZER MED (SOFTGOODS) ×2 IMPLANT
SLING ULTRA II L (ORTHOPEDIC SUPPLIES) IMPLANT
STRIP CLOSURE SKIN 1/2X4 (GAUZE/BANDAGES/DRESSINGS) ×2 IMPLANT
SUT ETHIBOND NAB CT1 #1 30IN (SUTURE) IMPLANT
SUT ETHILON 4 0 PS 2 18 (SUTURE) IMPLANT
SUT FIBERWIRE #2 38 T-5 BLUE (SUTURE)
SUT PROLENE 3 0 PS 2 (SUTURE) IMPLANT
SUT TIGER TAPE 7 IN WHITE (SUTURE) ×2 IMPLANT
SUT VIC AB 1-0 CT2 27 (SUTURE) ×2 IMPLANT
SUT VIC AB 2-0 CT2 27 (SUTURE) ×3 IMPLANT
SUTURE FIBERWR #2 38 T-5 BLUE (SUTURE) IMPLANT
TAPE FIBER 2MM 7IN #2 BLUE (SUTURE) IMPLANT
TOWEL OR 17X26 10 PK STRL BLUE (TOWEL DISPOSABLE) ×3 IMPLANT
TUBING ARTHRO INFLOW-ONLY STRL (TUBING) ×1 IMPLANT
TUBING CONNECTING 10 (TUBING) IMPLANT
TUBING CONNECTING 10' (TUBING)
WAND HAND CNTRL MULTIVAC 90 (MISCELLANEOUS) IMPLANT

## 2018-01-19 NOTE — Evaluation (Signed)
Occupational Therapy Evaluation Patient Details Name: Melissa Bowman MRN: [id number] DOB: May 17, 1951 Today's Date: 01/19/2018    History of Present Illness This 67 y.o. female admitted for Rt RCR and subcromial decompression.  PMH includes:  memory loss   Clinical Impression   Patient evaluated by Occupational Therapy with no further acute OT needs identified. All education has been completed and the patient has no further questions.All education completed.  Pt requires cues to avoid moving Rt shoulder.  Spouse able to demonstrate understanding of precautions and how to safely assist pt.  See below for any follow-up Occupational Therapy or equipment needs. OT is signing off. Thank you for this referral.      Follow Up Recommendations  Supervision/Assistance - 24 hour;Outpatient OT(per MD recommendations )    Equipment Recommendations  None recommended by OT    Recommendations for Other Services       Precautions / Restrictions Precautions Precautions: Shoulder Type of Shoulder Precautions: Pendulum exercises only.   Shoulder Interventions: Shoulder sling/immobilizer;At all times;Off for dressing/bathing/exercises Precaution Booklet Issued: Yes (comment) Precaution Comments: shoulder hand out and pendulum handout provided to pt  Required Braces or Orthoses: Sling Restrictions Weight Bearing Restrictions: Yes RUE Weight Bearing: Non weight bearing      Mobility Bed Mobility               General bed mobility comments: reviewed safe sleeping positions - recommended use of recliner   Transfers Overall transfer level: Needs assistance   Transfers: Sit to/from Stand;Stand Pivot Transfers Sit to Stand: Supervision Stand pivot transfers: Supervision            Balance Overall balance assessment: No apparent balance deficits (not formally assessed)                                         ADL either performed or assessed with clinical judgement    ADL                                         General ADL Comments: husband instructed how to assist her.  Pt has tendency to attempt to use Rt UE as soon as sling is removed.  Instructed both of them, that spouse should assist with all aspects of  bathing and dressing to prevent accidental movement of Rt shoulder.   Also instructed her to use a second sling for showering as she is likely to attempt to move/use Rt UE if it is left unslinged      Vision         Perception     Praxis      Pertinent Vitals/Pain Pain Assessment: No/denies pain     Hand Dominance Right   Extremity/Trunk Assessment     Lower Extremity Assessment Lower Extremity Assessment: Overall WFL for tasks assessed   Cervical / Trunk Assessment Cervical / Trunk Assessment: Kyphotic   Communication Communication Communication: No difficulties   Cognition Arousal/Alertness: Awake/alert Behavior During Therapy: Impulsive;WFL for tasks assessed/performed Overall Cognitive Status: Within Functional Limits for tasks assessed                                     General Comments  spouse present  Exercises Exercises: Shoulder Shoulder Exercises Pendulum Exercise: PROM;10 reps;Standing Elbow Flexion: AROM;Right;10 reps;Standing Elbow Extension: AROM;Right;10 reps;Standing Wrist Flexion: AROM;Right;Standing Wrist Extension: AROM;Right;10 reps;Standing Digit Composite Flexion: AROM;Right;10 reps;Standing Composite Extension: AROM;Right;Standing;10 reps Neck Flexion: AROM;5 reps;Seated Neck Extension: AROM;5 reps;Seated Neck Lateral Flexion - Right: AROM;5 reps;Seated Neck Lateral Flexion - Left: AROM;5 reps;Seated   Shoulder Instructions Shoulder Instructions Donning/doffing shirt without moving shoulder: Caregiver independent with task Method for sponge bathing under operated UE: Caregiver independent with task Donning/doffing sling/immobilizer: Caregiver independent  with task Correct positioning of sling/immobilizer: Caregiver independent with task Pendulum exercises (written home exercise program): Minimal assistance;Caregiver independent with task ROM for elbow, wrist and digits of operated UE: Supervision/safety;Caregiver independent with task Sling wearing schedule (on at all times/off for ADL's): Caregiver independent with task;Supervision/safety Proper positioning of operated UE when showering: Supervision/safety;Caregiver independent with task Positioning of UE while sleeping: Independent;Caregiver independent with task    Home Living Family/patient expects to be discharged to:: Private residence Living Arrangements: Spouse/significant other Available Help at Discharge: Family;Available 24 hours/day                             Additional Comments: Pt has additional slings at home       Prior Functioning/Environment Level of Independence: Independent        Comments: one recent fall that led to injury.  She tripped over curb in dark parking lot         OT Problem List: Decreased strength;Decreased range of motion;Decreased knowledge of precautions;Impaired UE functional use      OT Treatment/Interventions:      OT Goals(Current goals can be found in the care plan section) Acute Rehab OT Goals Patient Stated Goal: to go home and get back to normal  OT Goal Formulation: All assessment and education complete, DC therapy  OT Frequency:     Barriers to D/C:            Co-evaluation              AM-PAC PT "6 Clicks" Daily Activity     Outcome Measure Help from another person eating meals?: A Little Help from another person taking care of personal grooming?: A Little Help from another person toileting, which includes using toliet, bedpan, or urinal?: A Little Help from another person bathing (including washing, rinsing, drying)?: A Lot Help from another person to put on and taking off regular upper body  clothing?: A Lot Help from another person to put on and taking off regular lower body clothing?: A Lot 6 Click Score: 15   End of Session Equipment Utilized During Treatment: Other (comment)(sling ) Nurse Communication: Other (comment)(education completed and pt ready for discharge )  Activity Tolerance: Patient tolerated treatment well Patient left: in chair;with call bell/phone within reach;with family/visitor present  OT Visit Diagnosis: Muscle weakness (generalized) (M62.81)                Time: 3244-0102 OT Time Calculation (min): 47 min Charges:  OT General Charges $OT Visit: 1 Visit OT Evaluation $OT Eval Low Complexity: 1 Low OT Treatments $Self Care/Home Management : 8-22 mins $Therapeutic Exercise: 8-22 mins G-Codes:     [employer], OTR/L [phone]   Jeani Hawking M 01/19/2018, 12:55 PM

## 2018-01-19 NOTE — Progress Notes (Signed)
PACU Phase II Nsg Note: Pt ready for DC to home per MD orders, pt A&O x 4, able to MAE x 4, tolerating PO well, pain under control. Pt teaching by OT completed per orders. Pt teaching done with pt and husband. Reviewed DC / AVS sheet with pt, also pt teaching done re: Rx x 2 written by MD. Discussed importance of wearing sling, also Post Op General Anesthesia diet and rest for next 24 hours. Teaching done re: use of Ice Pack and pain control at home. Opportunity for questions provided. Teach Back Method used. Pt placed and w/c and escorted to exit. Copy of DC instructions with 2 Rx

## 2018-01-19 NOTE — Interval H&P Note (Signed)
History and Physical Interval Note:  01/19/2018 7:29 AM  Melissa Bowman  has presented today for surgery, with the diagnosis of Right shoulder rotator cuff tear  The various methods of treatment have been discussed with the patient and family. After consideration of risks, benefits and other options for treatment, the patient has consented to  Procedure(s) with comments: RIGHT MINI OPEN ROTATOR CUFF REPAIR AND SUBACROMIAL DECOMPRESSION (Right) - 90 MINS as a surgical intervention .  The patient's history has been reviewed, patient examined, no change in status, stable for surgery.  I have reviewed the patient's chart and labs.  Questions were answered to the patient's satisfaction.     Marisol Glazer C

## 2018-01-19 NOTE — Brief Op Note (Signed)
01/19/2018  8:37 AM  PATIENT:  Melissa Bowman  67 y.o. female  PRE-OPERATIVE DIAGNOSIS:  Right shoulder rotator cuff tear  POST-OPERATIVE DIAGNOSIS:  Right shoulder rotator cuff tear  PROCEDURE:  Procedure(s) with comments: REVISION RIGHT MINI OPEN ROTATOR CUFF REPAIR AND SUBACROMIAL DECOMPRESSION (Right) - 90 MINS  SURGEON:  Surgeon(s) and Role:    Susa Day, MD - Primary  PHYSICIAN ASSISTANT:   ASSISTANTS: [place]   ANESTHESIA:   general  EBL:   min   BLOOD ADMINISTERED:none  DRAINS: none   LOCAL MEDICATIONS USED:  MARCAINE     SPECIMEN:  No Specimen  DISPOSITION OF SPECIMEN:  N/A  COUNTS:  YES  TOURNIQUET:  * No tourniquets in log *  DICTATION: .Other Dictation: Dictation Number [id number]  PLAN OF CARE: Discharge to home after PACU  PATIENT DISPOSITION:  PACU - hemodynamically stable.   Delay start of Pharmacological VTE agent (>24hrs) due to surgical blood loss or risk of bleeding: no

## 2018-01-19 NOTE — Op Note (Signed)
NAMEGIABELLA, Melissa Bowman               ACCOUNT NO.:  [id number]  MEDICAL RECORD NO.:  [id number]  LOCATION:                                 FACILITY:  PHYSICIAN:  Susa Day, M.D.         DATE OF BIRTH:  DATE OF PROCEDURE:  01/19/2018 DATE OF DISCHARGE:  01/19/2018                              OPERATIVE REPORT   PREOPERATIVE DIAGNOSIS:  Recurrent rotator cuff tear in the right shoulder.  POSTOPERATIVE DIAGNOSIS:  Recurrent rotator cuff tear in the right shoulder.  PROCEDURES PERFORMED:  Revision right rotator cuff repair, subacromial decompression, acromioplasty.  ANESTHESIA:  General.  ASSISTANT:  Lacie Draft, PA.  HISTORY:  A 67 year old, history of a rotator cuff repair in the past, recent injury with full-thickness tear of the rotator cuff noted on MRI, indicated for repair of the rotator cuff.  Risks and benefits discussed including bleeding, infection, damage to neurovascular structures, no change in symptoms, worsening symptoms, DVT, PE, anesthetic complications, etc.  TECHNIQUE:  With the patient in supine beach-chair position, after induction of adequate general anesthesia and a block of the upper extremity, the right shoulder and upper extremity were prepped and draped in usual sterile fashion.  Surgical marker was utilized to delineate the [facility] joint coracoid.  Utilized incision based on a previous incision 2 cm in length off the anterior lateral aspect of the acromion. Subcutaneous tissue was dissected.  Electrocautery was utilized to achieve hemostasis.  Fascia lata and the raphe between the anterolateral heads were identified and divided in line with the skin incision. Preserving the deltoid attachment, we released the remainder of the CA ligament.  Digitally lysed subacromial adhesions.  Self-retaining retractor was placed.  Full-thickness tear of the supraspinatus at its attachment was noted.  I copiously irrigated the wound.  The remainder of the cuff was  unremarkable.  We debrided the edges and fashioned the trough in the greater tuberosity.  We threaded with a Scorpion FiberTape through the supraspinatus and infraspinatus, advanced it slightly from side-to-side over the greater tuberosity and placed in the SwiveLock after piloting a pilot hole with awl.  This was inserted without undue tension and full closure and the tear was noted.  Following this, redundant suture removed.  Copiously irrigated the wound, closed the raphe with 1 Vicryl, subcu with 2, and skin with Prolene.  Sterile dressing applied.  Placed in a sling, extubated without difficulty, and transported to the recovery room in satisfactory condition.  The patient tolerated the procedure well.  No complications.  Assistant, Lacie Draft, [place].  Minimal blood loss.     Susa Day, M.D.     Geralynn Rile  D:  01/19/2018  T:  01/19/2018  Job:  [id number]

## 2018-01-19 NOTE — Interval H&P Note (Signed)
History and Physical Interval Note:  01/19/2018 8:47 AM  Melissa Bowman  has presented today for surgery, with the diagnosis of Right shoulder rotator cuff tear  The various methods of treatment have been discussed with the patient and family. After consideration of risks, benefits and other options for treatment, the patient has consented to  Procedure(s) with comments: REVISION RIGHT MINI OPEN ROTATOR CUFF REPAIR AND SUBACROMIAL DECOMPRESSION (Right) - 90 MINS as a surgical intervention .  The patient's history has been reviewed, patient examined, no change in status, stable for surgery.  I have reviewed the patient's chart and labs.  Questions were answered to the patient's satisfaction.     Cornelis Kluver C

## 2018-01-19 NOTE — Transfer of Care (Signed)
Immediate Anesthesia Transfer of Care Note  Patient: Melissa Bowman  Procedure(s) Performed: REVISION RIGHT MINI OPEN ROTATOR CUFF REPAIR AND SUBACROMIAL DECOMPRESSION (Right Shoulder)  Patient Location: PACU  Anesthesia Type:GA combined with regional for post-op pain  Level of Consciousness: drowsy and patient cooperative  Airway & Oxygen Therapy: Patient Spontanous Breathing and Patient connected to face mask oxygen  Post-op Assessment: Report given to RN and Post -op Vital signs reviewed and stable  Post vital signs: Reviewed and stable  Last Vitals:  Vitals:   01/19/18 0614 01/19/18 0856  BP: (!) 150/86 (!) 133/91  Pulse: 66 99  Resp: 16 (!) 25  Temp: 37.7 C   SpO2: 99% 95%    Last Pain:  Vitals:   01/19/18 0614  TempSrc: Oral         Complications: No apparent anesthesia complications

## 2018-01-19 NOTE — Anesthesia Procedure Notes (Signed)
Anesthesia Regional Block: Interscalene brachial plexus block   Pre-Anesthetic Checklist: ,, timeout performed, Correct Patient, Correct Site, Correct Laterality, Correct Procedure, Correct Position, site marked, Risks and benefits discussed,  Surgical consent,  Pre-op evaluation,  At surgeon's request and post-op pain management  Laterality: Right  Prep: chloraprep       Needles:  Injection technique: Single-shot  Needle Type: Echogenic Stimulator Needle     Needle Length: 5cm      Additional Needles:   Procedures:, nerve stimulator,,,,,,,   Nerve Stimulator or Paresthesia:  Response: 0.4 mA,   Additional Responses:   Narrative:  Start time: 01/19/2018 7:20 AM End time: 01/19/2018 7:30 AM Injection made incrementally with aspirations every 5 mL.  Performed by: Personally  Anesthesiologist: Lillia Abed, MD  Additional Notes: Monitors applied. Patient sedated. Sterile prep and drape,hand hygiene and sterile gloves were used. Relevant anatomy identified.Needle position confirmed.Local anesthetic injected incrementally after negative aspiration. Local anesthetic spread visualized around nerve(s). Vascular puncture avoided. No complications. Image printed for medical record.The patient tolerated the procedure well.

## 2018-01-19 NOTE — Anesthesia Procedure Notes (Signed)
Procedure Name: Intubation Date/Time: 01/19/2018 7:46 AM Performed by: Montel Clock, CRNA Pre-anesthesia Checklist: Patient identified, Emergency Drugs available, Suction available, Patient being monitored and Timeout performed Patient Re-evaluated:Patient Re-evaluated prior to induction Oxygen Delivery Method: Circle system utilized Preoxygenation: Pre-oxygenation with 100% oxygen Induction Type: IV induction Ventilation: Mask ventilation without difficulty and Oral airway inserted - appropriate to patient size Laryngoscope Size: Mac and 3 Grade View: Grade II Tube type: Oral Tube size: 7.0 mm Number of attempts: 1 Airway Equipment and Method: Stylet Placement Confirmation: ETT inserted through vocal cords under direct vision,  positive ETCO2 and breath sounds checked- equal and bilateral Secured at: 21 cm Tube secured with: Tape Dental Injury: Teeth and Oropharynx as per pre-operative assessment  Comments: Poor grade II view, arytenoids visible, ETT passed with downward laryngeal pressure.

## 2018-01-19 NOTE — Interval H&P Note (Signed)
History and Physical Interval Note:  01/19/2018 7:29 AM  Melissa Bowman  has presented today for surgery, with the diagnosis of Right shoulder rotator cuff tear  The various methods of treatment have been discussed with the patient and family. After consideration of risks, benefits and other options for treatment, the patient has consented to  Procedure(s) with comments: RIGHT MINI OPEN ROTATOR CUFF REPAIR AND SUBACROMIAL DECOMPRESSION (Right) - 90 MINS as a surgical intervention .  The patient's history has been reviewed, patient examined, no change in status, stable for surgery.  I have reviewed the patient's chart and labs.  Questions were answered to the patient's satisfaction.     Jeryl Wilbourn C

## 2018-01-19 NOTE — Anesthesia Preprocedure Evaluation (Signed)
Anesthesia Evaluation  Patient identified by MRN, date of birth, ID band Patient awake    Reviewed: Allergy & Precautions, NPO status , Patient's Chart, lab work & pertinent test results  Airway Mallampati: I  TM Distance: >3 FB Neck ROM: Full    Dental   Pulmonary sleep apnea ,    Pulmonary exam normal        Cardiovascular hypertension, Pt. on medications Normal cardiovascular exam     Neuro/Psych Anxiety Depression    GI/Hepatic GERD  Medicated and Controlled,  Endo/Other  diabetes, Type 2, Oral Hypoglycemic Agents  Renal/GU      Musculoskeletal   Abdominal   Peds  Hematology   Anesthesia Other Findings   Reproductive/Obstetrics                             Anesthesia Physical Anesthesia Plan  ASA: II  Anesthesia Plan: General   Post-op Pain Management:  Regional for Post-op pain   Induction: Intravenous  PONV Risk Score and Plan: 3 and Ondansetron, Dexamethasone and Treatment may vary due to age or medical condition  Airway Management Planned: Oral ETT  Additional Equipment:   Intra-op Plan:   Post-operative Plan: Extubation in OR  Informed Consent: I have reviewed the patients History and Physical, chart, labs and discussed the procedure including the risks, benefits and alternatives for the proposed anesthesia with the patient or authorized representative who has indicated his/her understanding and acceptance.     Plan Discussed with: CRNA and Surgeon  Anesthesia Plan Comments:         Anesthesia Quick Evaluation

## 2018-01-19 NOTE — Anesthesia Postprocedure Evaluation (Signed)
Anesthesia Post Note  Patient: Melissa Bowman  Procedure(s) Performed: REVISION RIGHT MINI OPEN ROTATOR CUFF REPAIR AND SUBACROMIAL DECOMPRESSION (Right Shoulder)     Patient location during evaluation: PACU Anesthesia Type: General Level of consciousness: awake and alert Pain management: pain level controlled Vital Signs Assessment: post-procedure vital signs reviewed and stable Respiratory status: spontaneous breathing, nonlabored ventilation, respiratory function stable and patient connected to nasal cannula oxygen Cardiovascular status: blood pressure returned to baseline and stable Postop Assessment: no apparent nausea or vomiting Anesthetic complications: no    Last Vitals:  Vitals:   01/19/18 1037 01/19/18 1232  BP: (!) 158/84 (!) 151/85  Pulse: 90   Resp: 18 18  Temp: 37.2 C 37.1 C  SpO2: 90%     Last Pain:  Vitals:   01/19/18 0614  TempSrc: Oral                 Samira Acero DAVID

## 2018-01-19 NOTE — Discharge Instructions (Signed)

## 2018-01-19 NOTE — Progress Notes (Signed)
PACU Phase II Nursing Note: Spoke with OT staff in regards to patient needing OT consult per MD orders prior to DC to home. Stated will come see patient.

## 2018-01-20 ENCOUNTER — Encounter (HOSPITAL_COMMUNITY): Payer: Self-pay | Admitting: Specialist

## 2018-01-24 ENCOUNTER — Encounter (HOSPITAL_COMMUNITY): Payer: Self-pay | Admitting: Specialist

## 2018-01-28 ENCOUNTER — Telehealth: Payer: Medicare Other | Admitting: Nurse Practitioner

## 2018-01-28 DIAGNOSIS — N3000 Acute cystitis without hematuria: Secondary | ICD-10-CM

## 2018-01-28 MED ORDER — CIPROFLOXACIN HCL 500 MG PO TABS
500.0000 mg | ORAL_TABLET | Freq: Two times a day (BID) | ORAL | 0 refills | Status: DC
Start: 2018-01-28 — End: 2018-05-25

## 2018-01-28 NOTE — Progress Notes (Signed)

## 2018-02-05 ENCOUNTER — Other Ambulatory Visit: Payer: Self-pay | Admitting: Family Medicine

## 2018-02-06 ENCOUNTER — Other Ambulatory Visit: Payer: Self-pay | Admitting: *Deleted

## 2018-02-06 MED ORDER — AMLODIPINE BESYLATE 5 MG PO TABS
5.0000 mg | ORAL_TABLET | Freq: Every day | ORAL | 0 refills | Status: DC
Start: 2018-02-06 — End: 2018-06-10

## 2018-02-08 ENCOUNTER — Other Ambulatory Visit: Payer: Self-pay | Admitting: *Deleted

## 2018-02-08 ENCOUNTER — Ambulatory Visit: Payer: Medicare Other | Admitting: Family Medicine

## 2018-02-08 DIAGNOSIS — E785 Hyperlipidemia, unspecified: Principal | ICD-10-CM

## 2018-02-08 DIAGNOSIS — E1169 Type 2 diabetes mellitus with other specified complication: Secondary | ICD-10-CM

## 2018-02-08 MED ORDER — LOSARTAN POTASSIUM-HCTZ 100-25 MG PO TABS: 1.0000 | ORAL_TABLET | Freq: Every day | ORAL | 0 refills | Status: DC

## 2018-03-27 ENCOUNTER — Other Ambulatory Visit: Payer: Self-pay | Admitting: Physician Assistant

## 2018-04-01 ENCOUNTER — Other Ambulatory Visit: Payer: Self-pay | Admitting: Family Medicine

## 2018-04-03 ENCOUNTER — Telehealth: Payer: Self-pay | Admitting: *Deleted

## 2018-04-03 MED ORDER — METFORMIN HCL 1000 MG PO TABS: ORAL_TABLET | ORAL | 1 refills | Status: DC

## 2018-04-03 MED ORDER — METFORMIN HCL 1000 MG PO TABS: 1000.0000 mg | ORAL_TABLET | Freq: Two times a day (BID) | ORAL | 0 refills | Status: DC

## 2018-04-03 NOTE — Telephone Encounter (Signed)
Medication sent to pharmacy  

## 2018-04-05 ENCOUNTER — Other Ambulatory Visit: Payer: Self-pay | Admitting: Family Medicine

## 2018-04-05 ENCOUNTER — Other Ambulatory Visit: Payer: Self-pay | Admitting: *Deleted

## 2018-04-05 MED ORDER — METFORMIN HCL 1000 MG PO TABS: ORAL_TABLET | ORAL | 3 refills | Status: DC

## 2018-04-05 NOTE — Telephone Encounter (Signed)
OV 05/25/18

## 2018-04-22 ENCOUNTER — Other Ambulatory Visit: Payer: Self-pay | Admitting: Family Medicine

## 2018-04-24 ENCOUNTER — Other Ambulatory Visit: Payer: Self-pay | Admitting: *Deleted

## 2018-04-24 MED ORDER — VENLAFAXINE HCL 75 MG PO TABS: 75.0000 mg | ORAL_TABLET | Freq: Three times a day (TID) | ORAL | 0 refills | Status: DC

## 2018-05-02 ENCOUNTER — Other Ambulatory Visit: Payer: Self-pay | Admitting: *Deleted

## 2018-05-02 MED ORDER — VENLAFAXINE HCL ER 150 MG PO CP24: 150.0000 mg | ORAL_CAPSULE | Freq: Every day | ORAL | 1 refills | Status: DC

## 2018-05-02 MED ORDER — VENLAFAXINE HCL 75 MG PO TABS: ORAL_TABLET | ORAL | 0 refills | Status: DC

## 2018-05-25 ENCOUNTER — Encounter: Payer: Self-pay | Admitting: Family Medicine

## 2018-05-25 ENCOUNTER — Ambulatory Visit: Payer: Medicare Other | Admitting: Family Medicine

## 2018-05-25 VITALS — BP 120/71 | HR 69 | Temp 98.4°F | Ht 60.0 in | Wt 197.0 lb

## 2018-05-25 DIAGNOSIS — Z23 Encounter for immunization: Secondary | ICD-10-CM

## 2018-05-25 DIAGNOSIS — E785 Hyperlipidemia, unspecified: Secondary | ICD-10-CM | POA: Diagnosis not present

## 2018-05-25 DIAGNOSIS — E559 Vitamin D deficiency, unspecified: Secondary | ICD-10-CM | POA: Diagnosis not present

## 2018-05-25 DIAGNOSIS — K219 Gastro-esophageal reflux disease without esophagitis: Secondary | ICD-10-CM | POA: Diagnosis not present

## 2018-05-25 DIAGNOSIS — Z6839 Body mass index (BMI) 39.0-39.9, adult: Secondary | ICD-10-CM

## 2018-05-25 DIAGNOSIS — E1169 Type 2 diabetes mellitus with other specified complication: Secondary | ICD-10-CM | POA: Diagnosis not present

## 2018-05-25 DIAGNOSIS — Z0000 Encounter for general adult medical examination without abnormal findings: Secondary | ICD-10-CM

## 2018-05-25 DIAGNOSIS — Z13820 Encounter for screening for osteoporosis: Secondary | ICD-10-CM

## 2018-05-25 DIAGNOSIS — Z780 Asymptomatic menopausal state: Secondary | ICD-10-CM

## 2018-05-25 DIAGNOSIS — E119 Type 2 diabetes mellitus without complications: Secondary | ICD-10-CM

## 2018-05-25 DIAGNOSIS — I10 Essential (primary) hypertension: Secondary | ICD-10-CM

## 2018-05-25 LAB — BAYER DCA HB A1C WAIVED: HB A1C: 6.2 % (ref <7.0)

## 2018-05-25 MED ORDER — MUPIROCIN 2 % EX OINT: 1.0000 "application " | TOPICAL_OINTMENT | Freq: Two times a day (BID) | CUTANEOUS | 3 refills | Status: DC

## 2018-05-25 NOTE — Progress Notes (Signed)
Subjective:    Patient ID: Melissa Bowman, female    DOB: [birth year], 67 y.o.   MRN: [id number]  HPI Pt here for follow up and management of chronic medical problems which includes hyperlipidemia, hypertension and diabetes. She is taking medication regularly.  Melissa Bowman comes in today for a regular checkup and follow-up.  She has no specific complaints and is not in need of any refills.  She is going to get the Prevnar vaccine today.  She is due to get her mammogram soon and also due to get a DEXA scan.  She will get lab work done today.  Her vital signs are stable and her weight is down 6 pounds from the last visit.  Her BMI unfortunately is still in the morbid obese category due to 2 or more comorbid medical conditions with a BMI greater than 35.  Melissa Bowman has a history of asthma anxiety diabetes mellitus GERD sleep apnea hyperlipidemia and hypertension.  She has 1 sister that had ovarian cancer and another sister that had lymphoma.  Patient is pleasant and alert she stays active with her grandchildren.  She denies any chest pain pressure tightness or shortness of breath.  She denies any trouble with swallowing heartburn indigestion nausea vomiting diarrhea blood in the stool black tarry bowel movements or change in bowel habits.  She is passing her water without problems.  She checks her blood sugars regularly at home and her blood pressures regularly at home and these are always good she just did not bring the list in for [place] to review today.  She sees the eye doctor regularly and the next visit will be coming up in January 2020.  She sees my eye doctor.  She is not have to go back and see the ear nose and throat specialist because of the mastoidectomy and has not seen him for years.  Her next colonoscopy will be in 05/2021.  At that time she saw Dr. Sheryn Bison.  He has retired.  She will get her Prevnar vaccine today.    Patient Active Problem List   Diagnosis Date Noted  . Right rotator cuff tear  01/19/2018  . Osteopenia 12/26/2013  . Type 2 diabetes mellitus (HCC) 10/31/2013  . Hypertension 05/14/2013  . Hyperlipidemia 05/14/2013  . Depression 05/14/2013  . Allergic rhinitis 05/14/2013  . Vitamin D deficiency 05/14/2013  . Hemorrhage of rectum and anus 09/15/2011  . Dysphagia, pharyngoesophageal phase 09/15/2011  . NAFLD (nonalcoholic fatty liver disease) 65/78/4696  . Chronic RUQ pain 09/15/2011  . Diverticulosis of colon (without mention of hemorrhage) 09/15/2011  . BRBPR (bright red blood per rectum) 08/27/2011  . Internal hemorrhoids without mention of complication 08/27/2011  . Esophageal reflux 08/27/2011  . Abdominal pain 08/27/2011  . Esophageal dysphagia 08/27/2011  . Obesity 08/27/2011  . Anxiety and depression 08/27/2011  . 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 amitriptyline (ELAVIL) 10 MG tablet TAKE 1 TABLET BY MOUTH AT  BEDTIME  . amLODipine (NORVASC) 5 MG tablet Take 1 tablet (5 mg total) by mouth daily.  . Ascorbic Acid (VITAMIN C) 1000 MG tablet Take 1,000 mg by mouth daily.  Marland Kitchen aspirin EC 81 MG tablet Take 1 tablet (81 mg total) by mouth daily.  . Calcium Carb-Cholecalciferol (CALCIUM 600+D3) 600-800 MG-UNIT TABS Take 2 tablets by mouth daily.  . Cholecalciferol (VITAMIN D-3) 5000 UNITS TABS Take 5,000 Units by mouth daily.   Marland Kitchen  fenofibrate 160 MG tablet TAKE 1 TABLET BY MOUTH  DAILY  . fluticasone (FLONASE) 50 MCG/ACT nasal spray Use 2 sprays in each  nostril daily (Patient taking differently: Use 2 sprays in each  nostril daily as needed for congestion)  . glucose blood (ONE TOUCH ULTRA TEST) test strip TEST BLOOD SUGAR 3 TIMES A DAY  . ketorolac (TORADOL) 10 MG tablet Take 1 tablet (10 mg total) by mouth every 6 (six) hours as needed. Do not take with other anti-inflammatories  . losartan-hydrochlorothiazide (HYZAAR) 100-25 MG tablet TAKE 1 TABLET BY MOUTH  EVERY DAY  AS DIRECTED  . metFORMIN (GLUCOPHAGE) 1000 MG tablet Take 1 Tablet by mouth 2 times a day with meals  . montelukast (SINGULAIR) 10 MG tablet TAKE 1 TABLET BY MOUTH  DAILY  . omeprazole (PRILOSEC) 40 MG capsule TAKE 1 CAPSULE BY MOUTH  DAILY  . ONETOUCH VERIO test strip TEST 3 TIMES A DAY  . PROAIR HFA 108 (90 BASE) MCG/ACT inhaler Use 2 puffs into the lungs  every 6 hours as needed for wheezing or shortness of  breath (Patient taking differently: Use 2 puffs into the lungs  every 6 hours as needed for wheezing or shortness of  breath (spring time ONLY))  . raloxifene (EVISTA) 60 MG tablet Take 1 tablet (60 mg total) by mouth daily.  . SYMBICORT 160-4.5 MCG/ACT inhaler Use 2 puffs two times daily (Patient taking differently: Use 2 puffs two times daily as needed (springtime ONLY))  . venlafaxine XR (EFFEXOR XR) 150 MG 24 hr capsule Take 1 capsule (150 mg total) by mouth daily with breakfast.  . [DISCONTINUED] acetaminophen (TYLENOL) 325 MG tablet Take 2 tablets (650 mg total) by mouth 3 (three) times daily. (Patient not taking: Reported on 01/09/2018)  . [DISCONTINUED] amitriptyline (ELAVIL) 10 MG tablet Take 1 tablet (10 mg total) by mouth at bedtime. (Patient taking differently: Take 10 mg by mouth at bedtime. )  . [DISCONTINUED] ciprofloxacin (CIPRO) 500 MG tablet Take 1 tablet (500 mg total) by mouth 2 (two) times daily.  . [DISCONTINUED] docusate sodium (COLACE) 100 MG capsule Take 1 capsule (100 mg total) by mouth 2 (two) times daily.  . [DISCONTINUED] metFORMIN (GLUCOPHAGE) 1000 MG tablet Take 1 tablet (1,000 mg total) by mouth 2 (two) times daily with a meal.  . [DISCONTINUED] metFORMIN (GLUCOPHAGE) 1000 MG tablet TAKE 1 TABLET BY MOUTH 2 TIMES A DAY WITH MEALS  . [DISCONTINUED] venlafaxine (EFFEXOR) 75 MG tablet Take 2 tablets in the morning and 1 in the evening   No facility-administered encounter medications on file as of 05/25/2018.      Review of Systems  Constitutional: Negative.     HENT: Negative.   Eyes: Negative.   Respiratory: Negative.   Cardiovascular: Negative.   Gastrointestinal: Negative.   Endocrine: Negative.   Genitourinary: Negative.   Musculoskeletal: Negative.   Skin: Negative.   Allergic/Immunologic: Negative.   Neurological: Negative.   Hematological: Negative.   Psychiatric/Behavioral: Negative.        Objective:   Physical Exam  Constitutional: She is oriented to person, place, and time. She appears well-developed and well-nourished. No distress.  The patient is pleasant and relaxed and seems to be enjoying her life currently.  HENT:  Head: Normocephalic and atraumatic.  Right Ear: External ear normal.  Left Ear: External ear normal.  Nose: Nose normal.  Mouth/Throat: Oropharynx is clear and moist. No oropharyngeal exudate.  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  Eyes: Pupils are equal, round, and reactive to light. Conjunctivae and EOM are normal. Right eye exhibits no discharge. Left eye exhibits no discharge. No scleral icterus.  She gets her eye exams regularly and usually in January by my eye doctor.  Neck: Normal range of motion. Neck supple. No thyromegaly present.  No bruits thyromegaly or anterior cervical adenopathy.  There is a slight asymmetry in the right anterior cervical area compared to the left just below the angle of the jaw on the right.  But no masses  noted.  Cardiovascular: Normal rate, regular rhythm, normal heart sounds and intact distal pulses.  No murmur heard. Heart has a regular rate and rhythm at 72/min with no murmurs  Pulmonary/Chest: Effort normal and breath sounds normal. No respiratory distress. She has no wheezes. She has no rales.  Clear anteriorly and posteriorly with good breath sounds.  Abdominal: Soft. Bowel sounds are normal. She exhibits no mass. There is no tenderness. No hernia.  Abdominal fullness in general without liver or spleen enlargement  epigastric tenderness masses bruits or suprapubic tenderness  Musculoskeletal: Normal range of motion. She exhibits no edema.  Lymphadenopathy:    She has no cervical adenopathy.  Neurological: She is alert and oriented to person, place, and time. She has normal reflexes. No cranial nerve deficit.  Skin: Skin is warm and dry. No rash noted. There is erythema.  There are a couple of erythematous excoriations beneath the chin where she is tried to remove some irritated hair follicles.  We will give her some antibiotic ointment for this.  She will keep the area clean with some peroxide.  Psychiatric: She has a normal mood and affect. Her behavior is normal. Judgment and thought content normal.  The patient has normal mood affect and behavior.  Nursing note and vitals reviewed.  BP 120/71 (BP Location: Left Arm)   Pulse 69   Temp 98.4 F (36.9 C) (Oral)   Ht 5' (1.524 m)   Wt 197 lb (89.4 kg)   BMI 38.47 kg/m         Assessment & Plan:  1. Hyperlipidemia associated with type 2 diabetes mellitus (HCC) -Continue current treatment and as aggressive therapeutic lifestyle changes as possible.  She is currently apparently not on a statin drug and will have to be initiated with one once the lab work is back and we also may want to consider switching her to vascepa from omega-3 fatty acids. - CBC with Differential/Platelet - Lipid panel  2. Essential hypertension -Pressure good today and she will continue with current treatment - BMP8+EGFR - CBC with Differential/Platelet - Hepatic function panel  3. Vitamin D deficiency -Continue with vitamin D replacement pending results of lab work - DG [facility] DEXA; Future - CBC with Differential/Platelet - VITAMIN D 25 Hydroxy (Vit-D Deficiency, Fractures)  4. Gastroesophageal reflux disease, esophagitis presence not specified -No complaints with reflux today and she will continue with watching her diet closely and trying to reduce her weight is much  as possible. - CBC with Differential/Platelet - Hepatic function panel  5. Type 2 diabetes mellitus without complication, without long-term current use of insulin (HCC) -Continue with current treatment pending results of lab work - Bayer DCA Hb A1c Waived  6. Screening for osteoporosis - DG [facility] DEXA; Future  7. Postmenopausal - DG [facility] DEXA; Future  8. Healthcare maintenance - Thyroid Panel With TSH  9. Morbid obesity (HCC) -The patient has morbid obesity based upon 2 or  more comorbid conditions with a BMI greater than 35. -She will continue to work on weight loss through aggressive diet and exercise regimen.  10. BMI 39.0-39.9,adult -Continue with aggressive therapeutic lifestyle changes with a goal to achieve a lower BMI.  Patient Instructions                       Medicare Annual Wellness Visit  [facility] and the medical providers at [facility] strive to bring you the best medical care.  In doing so we not only want to address your current medical conditions and concerns but also to detect new conditions early and prevent illness, disease and health-related problems.    Medicare offers a yearly Wellness Visit which allows our clinical staff to assess your need for preventative services including immunizations, lifestyle education, counseling to decrease risk of preventable diseases and screening for fall risk and other medical concerns.    This visit is provided free of charge (no copay) for all Medicare recipients. The clinical pharmacists at [facility] have begun to conduct these Wellness Visits which will also include a thorough review of all your medications.    As you primary medical provider recommend that you make an appointment for your Annual Wellness Visit if you have not done so already this year.  You may set up this appointment before you leave today or you may call back ([phone]) and schedule an appointment.  Please  make sure when you call that you mention that you are scheduling your Annual Wellness Visit with the clinical pharmacist so that the appointment may be made for the proper length of time.     Continue current medications. Continue good therapeutic lifestyle changes which include good diet and exercise. Fall precautions discussed with patient. If an FOBT was given today- please return it to our front desk. If you are over 90 years old - you may need Prevnar 13 or the adult Pneumonia vaccine.  **Flu shots are available--- please call and schedule a FLU-CLINIC appointment**  After your visit with [place] today you will receive a survey in the mail or online from [employer] regarding your care with [place]. Please take a moment to fill this out. Your feedback is very important to [place] as you can help [place] better understand your patient needs as well as improve your experience and satisfaction. WE CARE ABOUT YOU!!!   Stay active physically Drink plenty of water and stay well-hydrated Continue to check blood sugars regularly and check feet daily Check blood pressures regularly and watch sodium intake We will call with lab work results as soon as these results become available Remember that your next colonoscopy will be due in October 2022 You will need to get the Pneumovax vaccine in 1 year since her getting the Prevnar vaccine today.  Nyra Capes MD

## 2018-05-25 NOTE — Patient Instructions (Addendum)
Medicare Annual Wellness Visit  [facility] and the medical providers at [facility] strive to bring you the best medical care.  In doing so we not only want to address your current medical conditions and concerns but also to detect new conditions early and prevent illness, disease and health-related problems.    Medicare offers a yearly Wellness Visit which allows our clinical staff to assess your need for preventative services including immunizations, lifestyle education, counseling to decrease risk of preventable diseases and screening for fall risk and other medical concerns.    This visit is provided free of charge (no copay) for all Medicare recipients. The clinical pharmacists at [facility] have begun to conduct these Wellness Visits which will also include a thorough review of all your medications.    As you primary medical provider recommend that you make an appointment for your Annual Wellness Visit if you have not done so already this year.  You may set up this appointment before you leave today or you may call back ([phone]) and schedule an appointment.  Please make sure when you call that you mention that you are scheduling your Annual Wellness Visit with the clinical pharmacist so that the appointment may be made for the proper length of time.     Continue current medications. Continue good therapeutic lifestyle changes which include good diet and exercise. Fall precautions discussed with patient. If an FOBT was given today- please return it to our front desk. If you are over 78 years old - you may need Prevnar 64 or the adult Pneumonia vaccine.  **Flu shots are available--- please call and schedule a FLU-CLINIC appointment**  After your visit with [place] today you will receive a survey in the mail or online from [employer] regarding your care with [place]. Please take a moment to fill this out. Your feedback is very  important to [place] as you can help [place] better understand your patient needs as well as improve your experience and satisfaction. WE CARE ABOUT YOU!!!   Stay active physically Drink plenty of water and stay well-hydrated Continue to check blood sugars regularly and check feet daily Check blood pressures regularly and watch sodium intake We will call with lab work results as soon as these results become available Remember that your next colonoscopy will be due in October 2022 You will need to get the Pneumovax vaccine in 1 year since her getting the Prevnar vaccine today.

## 2018-05-26 ENCOUNTER — Encounter: Payer: Self-pay | Admitting: Family Medicine

## 2018-05-26 ENCOUNTER — Other Ambulatory Visit: Payer: Self-pay | Admitting: *Deleted

## 2018-05-26 DIAGNOSIS — E785 Hyperlipidemia, unspecified: Principal | ICD-10-CM

## 2018-05-26 DIAGNOSIS — E1169 Type 2 diabetes mellitus with other specified complication: Secondary | ICD-10-CM

## 2018-05-26 LAB — LIPID PANEL
Chol/HDL Ratio: 5.4 ratio — ABNORMAL HIGH (ref 0.0–4.4)
Cholesterol, Total: 189 mg/dL (ref 100–199)
HDL: 35 mg/dL — ABNORMAL LOW (ref >39)
LDL CALC: 104 mg/dL — AB (ref 0–99)
Triglycerides: 250 mg/dL — ABNORMAL HIGH (ref 0–149)
VLDL CHOLESTEROL CAL: 50 mg/dL — AB (ref 5–40)

## 2018-05-26 LAB — THYROID PANEL WITH TSH
FREE THYROXINE INDEX: 1.6 (ref 1.2–4.9)
T3 UPTAKE RATIO: 19 % — AB (ref 24–39)
T4 TOTAL: 8.6 ug/dL (ref 4.5–12.0)
TSH: 2.3 u[IU]/mL (ref 0.450–4.500)

## 2018-05-26 LAB — CBC WITH DIFFERENTIAL/PLATELET
BASOS: 1 %
Basophils Absolute: 0 10*3/uL (ref 0.0–0.2)
EOS (ABSOLUTE): 0.2 10*3/uL (ref 0.0–0.4)
EOS: 3 %
HEMATOCRIT: 38.5 % (ref 34.0–46.6)
Hemoglobin: 13.2 g/dL (ref 11.1–15.9)
Immature Grans (Abs): 0 10*3/uL (ref 0.0–0.1)
Immature Granulocytes: 0 %
LYMPHS ABS: 3.3 10*3/uL — AB (ref 0.7–3.1)
Lymphs: 49 %
MCH: 30.5 pg (ref 26.6–33.0)
MCHC: 34.3 g/dL (ref 31.5–35.7)
MCV: 89 fL (ref 79–97)
MONOS ABS: 0.3 10*3/uL (ref 0.1–0.9)
Monocytes: 4 %
NEUTROS ABS: 2.9 10*3/uL (ref 1.4–7.0)
Neutrophils: 43 %
PLATELETS: 316 10*3/uL (ref 150–450)
RBC: 4.33 x10E6/uL (ref 3.77–5.28)
RDW: 13.5 % (ref 12.3–15.4)
WBC: 6.8 10*3/uL (ref 3.4–10.8)

## 2018-05-26 LAB — HEPATIC FUNCTION PANEL
ALK PHOS: 48 IU/L (ref 39–117)
ALT: 71 IU/L — ABNORMAL HIGH (ref 0–32)
AST: 81 IU/L — ABNORMAL HIGH (ref 0–40)
Albumin: 4.8 g/dL (ref 3.6–4.8)
Bilirubin Total: 0.3 mg/dL (ref 0.0–1.2)
Bilirubin, Direct: 0.12 mg/dL (ref 0.00–0.40)
TOTAL PROTEIN: 6.8 g/dL (ref 6.0–8.5)

## 2018-05-26 LAB — BMP8+EGFR
BUN / CREAT RATIO: 29 — AB (ref 12–28)
BUN: 18 mg/dL (ref 8–27)
CO2: 23 mmol/L (ref 20–29)
CREATININE: 0.62 mg/dL (ref 0.57–1.00)
Calcium: 10.5 mg/dL — ABNORMAL HIGH (ref 8.7–10.3)
Chloride: 100 mmol/L (ref 96–106)
GFR calc Af Amer: 108 mL/min/{1.73_m2} (ref >59)
GFR, EST NON AFRICAN AMERICAN: 94 mL/min/{1.73_m2} (ref >59)
Glucose: 104 mg/dL — ABNORMAL HIGH (ref 65–99)
Potassium: 4.1 mmol/L (ref 3.5–5.2)
SODIUM: 142 mmol/L (ref 134–144)

## 2018-05-26 LAB — VITAMIN D 25 HYDROXY (VIT D DEFICIENCY, FRACTURES): VIT D 25 HYDROXY: 51.3 ng/mL (ref 30.0–100.0)

## 2018-05-26 MED ORDER — ROSUVASTATIN CALCIUM 5 MG PO TABS: 2.5000 mg | ORAL_TABLET | ORAL | 3 refills | Status: DC

## 2018-05-26 MED ORDER — ICOSAPENT ETHYL 1 G PO CAPS: 2.0000 | ORAL_CAPSULE | Freq: Two times a day (BID) | ORAL | 3 refills | Status: DC

## 2018-05-29 ENCOUNTER — Ambulatory Visit (INDEPENDENT_AMBULATORY_CARE_PROVIDER_SITE_OTHER): Payer: Medicare Other

## 2018-05-29 ENCOUNTER — Other Ambulatory Visit: Payer: Medicare Other

## 2018-05-29 DIAGNOSIS — Z13820 Encounter for screening for osteoporosis: Secondary | ICD-10-CM

## 2018-05-29 DIAGNOSIS — E559 Vitamin D deficiency, unspecified: Secondary | ICD-10-CM

## 2018-05-29 DIAGNOSIS — M8589 Other specified disorders of bone density and structure, multiple sites: Secondary | ICD-10-CM | POA: Diagnosis not present

## 2018-05-29 DIAGNOSIS — Z780 Asymptomatic menopausal state: Secondary | ICD-10-CM | POA: Diagnosis not present

## 2018-06-05 ENCOUNTER — Ambulatory Visit: Payer: Self-pay

## 2018-06-05 DIAGNOSIS — Z1231 Encounter for screening mammogram for malignant neoplasm of breast: Secondary | ICD-10-CM | POA: Diagnosis not present

## 2018-06-05 LAB — HM MAMMOGRAPHY

## 2018-06-10 ENCOUNTER — Other Ambulatory Visit: Payer: Self-pay | Admitting: Family Medicine

## 2018-07-05 ENCOUNTER — Other Ambulatory Visit: Payer: Self-pay | Admitting: Family Medicine

## 2018-07-18 NOTE — Progress Notes (Signed)
In Care Everywhere  

## 2018-08-05 ENCOUNTER — Other Ambulatory Visit: Payer: Self-pay | Admitting: Family Medicine

## 2018-09-03 ENCOUNTER — Telehealth: Payer: Medicare Other | Admitting: Family

## 2018-09-03 DIAGNOSIS — R399 Unspecified symptoms and signs involving the genitourinary system: Secondary | ICD-10-CM

## 2018-09-03 MED ORDER — CEPHALEXIN 500 MG PO CAPS: 500.0000 mg | ORAL_CAPSULE | Freq: Two times a day (BID) | ORAL | 0 refills | Status: DC

## 2018-09-03 NOTE — Progress Notes (Signed)

## 2018-10-06 DIAGNOSIS — Z23 Encounter for immunization: Secondary | ICD-10-CM | POA: Diagnosis not present

## 2018-10-16 ENCOUNTER — Other Ambulatory Visit: Payer: Self-pay | Admitting: Family Medicine

## 2018-10-19 ENCOUNTER — Other Ambulatory Visit: Payer: Self-pay | Admitting: Family Medicine

## 2018-11-06 ENCOUNTER — Other Ambulatory Visit: Payer: Self-pay | Admitting: *Deleted

## 2018-11-06 MED ORDER — HYDROCHLOROTHIAZIDE 25 MG PO TABS: 25.0000 mg | ORAL_TABLET | Freq: Every day | ORAL | 0 refills | Status: DC

## 2018-11-06 MED ORDER — LOSARTAN POTASSIUM 100 MG PO TABS: 100.0000 mg | ORAL_TABLET | Freq: Every day | ORAL | 0 refills | Status: DC

## 2018-11-24 ENCOUNTER — Encounter: Payer: Self-pay | Admitting: Family Medicine

## 2018-11-24 ENCOUNTER — Ambulatory Visit (INDEPENDENT_AMBULATORY_CARE_PROVIDER_SITE_OTHER): Payer: Medicare Other | Admitting: Family Medicine

## 2018-11-24 VITALS — BP 127/69 | HR 68 | Temp 99.3°F | Ht 60.0 in | Wt 198.0 lb

## 2018-11-24 DIAGNOSIS — F320 Major depressive disorder, single episode, mild: Secondary | ICD-10-CM

## 2018-11-24 DIAGNOSIS — E559 Vitamin D deficiency, unspecified: Secondary | ICD-10-CM | POA: Diagnosis not present

## 2018-11-24 DIAGNOSIS — R399 Unspecified symptoms and signs involving the genitourinary system: Secondary | ICD-10-CM

## 2018-11-24 DIAGNOSIS — K219 Gastro-esophageal reflux disease without esophagitis: Secondary | ICD-10-CM | POA: Diagnosis not present

## 2018-11-24 DIAGNOSIS — E119 Type 2 diabetes mellitus without complications: Secondary | ICD-10-CM | POA: Diagnosis not present

## 2018-11-24 DIAGNOSIS — E1169 Type 2 diabetes mellitus with other specified complication: Secondary | ICD-10-CM

## 2018-11-24 DIAGNOSIS — R6889 Other general symptoms and signs: Secondary | ICD-10-CM | POA: Diagnosis not present

## 2018-11-24 DIAGNOSIS — R32 Unspecified urinary incontinence: Secondary | ICD-10-CM

## 2018-11-24 DIAGNOSIS — E785 Hyperlipidemia, unspecified: Secondary | ICD-10-CM

## 2018-11-24 DIAGNOSIS — I10 Essential (primary) hypertension: Secondary | ICD-10-CM | POA: Diagnosis not present

## 2018-11-24 DIAGNOSIS — R4589 Other symptoms and signs involving emotional state: Secondary | ICD-10-CM

## 2018-11-24 LAB — BAYER DCA HB A1C WAIVED: HB A1C: 6 % (ref <7.0)

## 2018-11-24 NOTE — Progress Notes (Signed)
Subjective:    Patient ID: Melissa Bowman, female    DOB: 08-Sep-1951, 67 y.o.   MRN: [id number]  HPI Pt here for follow up and management of chronic medical problems which includes diabetes, hyperlipidemia and GERD. She is taking medication regularly.  The patient has had a recent cold and is concerned about her depression symptoms as she has been crying a lot recently.  She is currently taking Effexor X are as well as amitriptyline at bedtime.  This patient has multiple diagnoses including depression diabetes GERD sleep apnea hyperlipidemia hypertension anxiety and asthma.  She is currently taking Effexor XR 150 mg daily.  She is also taking on amitriptyline of 10 mg at bedtime.  Patient is pleasant and doing well other than crying all the time especially in response to when people say things to her she is very tearful and reacts with crying.  This happens every day usually.  Especially if somebody says something to her or she reads something in the paper or she sees something she is very emotional in response to voice and seeing.  She is taking the Effexor regularly she is taking a lot of other medicines.  We discussed having her meet with the clinical pharmacist because of her polypharmacy issues to make sure that what she is taking is not making her sensitivity worse and also to try to find the right addition to what we can do to keep her from crying less.  She also complains of wearing a pad constantly to control her incontinence and has not seen Dr. Annabell Howells in a good while so we will make arrangements for her to see Dr. Annabell Howells.  She denies any chest pain pressure tightness or shortness of breath.  She denies any trouble with her intestinal track or change in bowel habits.  Her last colonoscopy was done in 2012.  She does need to try to get better control on her incontinence we will check a urine today.  There is not anything in particular that she is worried about as far as anyone's health or anything  she did have a sister that died recently but she is not worried about that.    Patient Active Problem List   Diagnosis Date Noted  . Right rotator cuff tear 01/19/2018  . Osteopenia 12/26/2013  . Type 2 diabetes mellitus (HCC) 10/31/2013  . Hypertension 05/14/2013  . Hyperlipidemia 05/14/2013  . Depression 05/14/2013  . Allergic rhinitis 05/14/2013  . Vitamin D deficiency 05/14/2013  . Hemorrhage of rectum and anus 09/15/2011  . Dysphagia, pharyngoesophageal phase 09/15/2011  . Hepatic steatosis 09/15/2011  . Chronic RUQ pain 09/15/2011  . Diverticulosis of colon (without mention of hemorrhage) 09/15/2011  . BRBPR (bright red blood per rectum) 08/27/2011  . Internal hemorrhoids without mention of complication 08/27/2011  . Esophageal reflux 08/27/2011  . Abdominal pain 08/27/2011  . Esophageal dysphagia 08/27/2011  . Obesity 08/27/2011  . Anxiety and depression 08/27/2011  . 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 amitriptyline (ELAVIL) 10 MG tablet TAKE 1 TABLET BY MOUTH AT  BEDTIME  . amLODipine (NORVASC) 5 MG tablet TAKE 1 TABLET BY MOUTH  DAILY  . Ascorbic Acid (VITAMIN C) 1000 MG tablet Take 1,000 mg by mouth daily.  Marland Kitchen aspirin EC 81 MG tablet Take 1 tablet (81 mg total) by mouth daily.  . Calcium Carb-Cholecalciferol (CALCIUM 600+D3) 600-800 MG-UNIT TABS Take 2 tablets by  mouth daily.  . Cholecalciferol (VITAMIN D-3) 5000 UNITS TABS Take 5,000 Units by mouth daily.   . fenofibrate 160 MG tablet TAKE 1 TABLET BY MOUTH  DAILY  . fluticasone (FLONASE) 50 MCG/ACT nasal spray Use 2 sprays in each  nostril daily (Patient taking differently: Use 2 sprays in each  nostril daily as needed for congestion)  . glucose blood (ONE TOUCH ULTRA TEST) test strip TEST BLOOD SUGAR 3 TIMES A DAY  . hydrochlorothiazide (HYDRODIURIL) 25 MG tablet Take 1 tablet (25 mg total) by mouth daily.  Marland Kitchen losartan  (COZAAR) 100 MG tablet Take 1 tablet (100 mg total) by mouth daily.  Marland Kitchen losartan-hydrochlorothiazide (HYZAAR) 100-25 MG tablet TAKE 1 TABLET BY MOUTH  EVERY DAY AS DIRECTED  . metFORMIN (GLUCOPHAGE) 1000 MG tablet TAKE 1 TABLET BY MOUTH TWO  TIMES DAILY WITH MEALS  . montelukast (SINGULAIR) 10 MG tablet TAKE 1 TABLET BY MOUTH  DAILY  . mupirocin ointment (BACTROBAN) 2 % Apply 1 application topically 2 (two) times daily.  Marland Kitchen omeprazole (PRILOSEC) 40 MG capsule TAKE 1 CAPSULE BY MOUTH  DAILY  . ONETOUCH VERIO test strip TEST 3 TIMES A DAY  . PROAIR HFA 108 (90 BASE) MCG/ACT inhaler Use 2 puffs into the lungs  every 6 hours as needed for wheezing or shortness of  breath (Patient taking differently: Use 2 puffs into the lungs  every 6 hours as needed for wheezing or shortness of  breath (spring time ONLY))  . raloxifene (EVISTA) 60 MG tablet Take 1 tablet (60 mg total) by mouth daily.  . SYMBICORT 160-4.5 MCG/ACT inhaler Use 2 puffs two times daily (Patient taking differently: Use 2 puffs two times daily as needed (springtime ONLY))  . venlafaxine XR (EFFEXOR-XR) 150 MG 24 hr capsule TAKE 1 CAPSULE ONCE DAILY WITH BREAKFAST  . [DISCONTINUED] cephALEXin (KEFLEX) 500 MG capsule Take 1 capsule (500 mg total) by mouth 2 (two) times daily.  . [DISCONTINUED] Icosapent Ethyl (VASCEPA) 1 g CAPS Take 2 capsules (2 g total) by mouth 2 (two) times daily.  . [DISCONTINUED] ketorolac (TORADOL) 10 MG tablet Take 1 tablet (10 mg total) by mouth every 6 (six) hours as needed. Do not take with other anti-inflammatories  . [DISCONTINUED] rosuvastatin (CRESTOR) 5 MG tablet Take 0.5 tablets (2.5 mg total) by mouth 3 (three) times a week.   No facility-administered encounter medications on file as of 11/24/2018.       Review of Systems  Constitutional: Negative.   HENT: Positive for postnasal drip (recent cold).   Eyes: Negative.   Respiratory: Negative.   Cardiovascular: Negative.   Gastrointestinal: Negative.     Endocrine: Negative.   Genitourinary: Negative.   Musculoskeletal: Negative.   Skin: Negative.   Allergic/Immunologic: Negative.   Neurological: Negative.   Hematological: Negative.   Psychiatric/Behavioral: Negative.        Objective:   Physical Exam Vitals signs and nursing note reviewed.  Constitutional:      Appearance: Normal appearance. She is well-developed. She is obese.  HENT:     Head: Normocephalic and atraumatic.     Right Ear: Tympanic membrane, ear canal and external ear normal. There is no impacted cerumen.     Left Ear: Tympanic membrane, ear canal and external ear normal. There is no impacted cerumen.     Nose: Nose normal. No congestion.     Mouth/Throat:     Mouth: Mucous membranes are moist.     Pharynx: Oropharynx is clear.  Eyes:  General: No scleral icterus.       Right eye: No discharge.        Left eye: No discharge.     Conjunctiva/sclera: Conjunctivae normal.     Pupils: Pupils are equal, round, and reactive to light.  Neck:     Musculoskeletal: Normal range of motion and neck supple.     Thyroid: No thyromegaly.     Vascular: No carotid bruit or JVD.     Comments: No thyromegaly anterior cervical adenopathy or bruits Cardiovascular:     Rate and Rhythm: Normal rate and regular rhythm.     Heart sounds: Normal heart sounds. No murmur.     Comments: The heart is regular at 72/min Pulmonary:     Effort: Pulmonary effort is normal.     Breath sounds: Normal breath sounds. No wheezing or rales.  Abdominal:     General: Bowel sounds are normal.     Palpations: Abdomen is soft. There is no mass.     Tenderness: There is no abdominal tenderness.     Comments: Abdominal obesity without masses tenderness organ enlargement or bruits  Musculoskeletal: Normal range of motion.        General: No tenderness.     Right lower leg: No edema.     Left lower leg: No edema.  Lymphadenopathy:     Cervical: No cervical adenopathy.  Skin:    General:  Skin is warm and dry.     Findings: No rash.  Neurological:     Mental Status: She is alert and oriented to person, place, and time.     Cranial Nerves: No cranial nerve deficit.     Deep Tendon Reflexes: Reflexes are normal and symmetric.     Comments: Reflexes are 2+ and equal bilaterally  Psychiatric:        Mood and Affect: Mood normal.        Behavior: Behavior normal.        Thought Content: Thought content normal.        Judgment: Judgment normal.    BP 127/69 (BP Location: Left Arm)   Pulse 68   Temp 99.3 F (37.4 C) (Oral)   Ht 5' (1.524 m)   Wt 198 lb (89.8 kg)   BMI 38.67 kg/m         Assessment & Plan:  1. Hyperlipidemia associated with type 2 diabetes mellitus (HCC) -Continue with Crestor and as aggressive therapeutic lifestyle changes as possible.  Continue with Vascepa and fenofibrate pending results of lab work. - CBC with Differential/Platelet - Lipid panel  2. Essential hypertension -The blood pressure is good today and she will continue with current treatment - BMP8+EGFR - CBC with Differential/Platelet - Hepatic function panel  3. Vitamin D deficiency -Continue with vitamin D replacement pending results of lab work - CBC with Differential/Platelet - VITAMIN D 25 Hydroxy (Vit-D Deficiency, Fractures)  4. Gastroesophageal reflux disease, esophagitis presence not specified -Continue with omeprazole as patient has no complaints with reflux today. - CBC with Differential/Platelet  5. Type 2 diabetes mellitus without complication, without long-term current use of insulin (HCC) -Patient is not checking blood sugars regularly and she will do a better job of checking them more frequently and continue with current treatment pending results of lab work - CBC with Differential/Platelet - Bayer DCA Hb A1c Waived  6. UTI symptoms -Patient has ongoing incontinence and we will arrange for her to see the urologist for further follow-up  7. Morbid obesity  (  HCC) -Continue to work aggressively with diet and exercise to lose weight and control blood sugar and cholesterol.  8.  Depression -Schedule visit with clinical pharmacy to look at other options or possibly increasing current dose of Effexor.  Patient Instructions                       Medicare Annual Wellness Visit  [place] and the medical providers at [facility] strive to bring you the best medical care.  In doing so we not only want to address your current medical conditions and concerns but also to detect new conditions early and prevent illness, disease and health-related problems.    Medicare offers a yearly Wellness Visit which allows our clinical staff to assess your need for preventative services including immunizations, lifestyle education, counseling to decrease risk of preventable diseases and screening for fall risk and other medical concerns.    This visit is provided free of charge (no copay) for all Medicare recipients. The clinical pharmacists at [facility] have begun to conduct these Wellness Visits which will also include a thorough review of all your medications.    As you primary medical provider recommend that you make an appointment for your Annual Wellness Visit if you have not done so already this year.  You may set up this appointment before you leave today or you may call back ([phone]) and schedule an appointment.  Please make sure when you call that you mention that you are scheduling your Annual Wellness Visit with the clinical pharmacist so that the appointment may be made for the proper length of time.     Continue current medications. Continue good therapeutic lifestyle changes which include good diet and exercise. Fall precautions discussed with patient. If an FOBT was given today- please return it to our front desk. If you are over 52 years old - you may need Prevnar 13 or the adult Pneumonia  vaccine.  **Flu shots are available--- please call and schedule a FLU-CLINIC appointment**  After your visit with [place] today you will receive a survey in the mail or online from [employer] regarding your care with [place]. Please take a moment to fill this out. Your feedback is very important to [place] as you can help [place] better understand your patient needs as well as improve your experience and satisfaction. WE CARE ABOUT YOU!!!   Check blood sugars more regularly at home We will arrange for you to have an appointment with the clinical pharmacist to discuss your polypharmacy but especially to make sure that none of the medicines you are taking could be make in your emotional issues worse with the crying etc. She may can suggest that we try additional Effexor or try something different for the depression We will also schedule you for a visit with a urologist, Dr. Annabell Howells because of your ongoing issues with incontinence Drink plenty of water  Nyra Capes MD

## 2018-11-24 NOTE — Patient Instructions (Addendum)
Medicare Annual Wellness Visit  [place] and the medical providers at [facility] strive to bring you the best medical care.  In doing so we not only want to address your current medical conditions and concerns but also to detect new conditions early and prevent illness, disease and health-related problems.    Medicare offers a yearly Wellness Visit which allows our clinical staff to assess your need for preventative services including immunizations, lifestyle education, counseling to decrease risk of preventable diseases and screening for fall risk and other medical concerns.    This visit is provided free of charge (no copay) for all Medicare recipients. The clinical pharmacists at [facility] have begun to conduct these Wellness Visits which will also include a thorough review of all your medications.    As you primary medical provider recommend that you make an appointment for your Annual Wellness Visit if you have not done so already this year.  You may set up this appointment before you leave today or you may call back ([phone]) and schedule an appointment.  Please make sure when you call that you mention that you are scheduling your Annual Wellness Visit with the clinical pharmacist so that the appointment may be made for the proper length of time.     Continue current medications. Continue good therapeutic lifestyle changes which include good diet and exercise. Fall precautions discussed with patient. If an FOBT was given today- please return it to our front desk. If you are over 47 years old - you may need Prevnar 49 or the adult Pneumonia vaccine.  **Flu shots are available--- please call and schedule a FLU-CLINIC appointment**  After your visit with [place] today you will receive a survey in the mail or online from [employer] regarding your care with [place]. Please take a moment to fill this out. Your feedback is very  important to [place] as you can help [place] better understand your patient needs as well as improve your experience and satisfaction. WE CARE ABOUT YOU!!!   Check blood sugars more regularly at home We will arrange for you to have an appointment with the clinical pharmacist to discuss your polypharmacy but especially to make sure that none of the medicines you are taking could be make in your emotional issues worse with the crying etc. She may can suggest that we try additional Effexor or try something different for the depression We will also schedule you for a visit with a urologist, Dr. Jeffie Pollock because of your ongoing issues with incontinence Drink plenty of water

## 2018-11-25 LAB — CBC WITH DIFFERENTIAL/PLATELET
BASOS ABS: 0.1 10*3/uL (ref 0.0–0.2)
Basos: 1 %
EOS (ABSOLUTE): 0.2 10*3/uL (ref 0.0–0.4)
Eos: 3 %
Hematocrit: 36 % (ref 34.0–46.6)
Hemoglobin: 12.5 g/dL (ref 11.1–15.9)
IMMATURE GRANS (ABS): 0 10*3/uL (ref 0.0–0.1)
IMMATURE GRANULOCYTES: 0 %
LYMPHS: 44 %
Lymphocytes Absolute: 2.6 10*3/uL (ref 0.7–3.1)
MCH: 30.5 pg (ref 26.6–33.0)
MCHC: 34.7 g/dL (ref 31.5–35.7)
MCV: 88 fL (ref 79–97)
MONOCYTES: 6 %
Monocytes Absolute: 0.3 10*3/uL (ref 0.1–0.9)
NEUTROS ABS: 2.8 10*3/uL (ref 1.4–7.0)
Neutrophils: 46 %
Platelets: 284 10*3/uL (ref 150–450)
RBC: 4.1 x10E6/uL (ref 3.77–5.28)
RDW: 13.7 % (ref 12.3–15.4)
WBC: 6 10*3/uL (ref 3.4–10.8)

## 2018-11-25 LAB — HEPATIC FUNCTION PANEL
ALT: 40 IU/L — ABNORMAL HIGH (ref 0–32)
AST: 29 IU/L (ref 0–40)
Albumin: 4.7 g/dL (ref 3.6–4.8)
Alkaline Phosphatase: 47 IU/L (ref 39–117)
Bilirubin Total: 0.4 mg/dL (ref 0.0–1.2)
Bilirubin, Direct: 0.12 mg/dL (ref 0.00–0.40)
Total Protein: 6.9 g/dL (ref 6.0–8.5)

## 2018-11-25 LAB — BMP8+EGFR
BUN/Creatinine Ratio: 28 (ref 12–28)
BUN: 16 mg/dL (ref 8–27)
CALCIUM: 9.7 mg/dL (ref 8.7–10.3)
CO2: 22 mmol/L (ref 20–29)
Chloride: 100 mmol/L (ref 96–106)
Creatinine, Ser: 0.58 mg/dL (ref 0.57–1.00)
GFR, EST AFRICAN AMERICAN: 110 mL/min/{1.73_m2} (ref >59)
GFR, EST NON AFRICAN AMERICAN: 95 mL/min/{1.73_m2} (ref >59)
Glucose: 128 mg/dL — ABNORMAL HIGH (ref 65–99)
Potassium: 4.2 mmol/L (ref 3.5–5.2)
Sodium: 140 mmol/L (ref 134–144)

## 2018-11-25 LAB — LIPID PANEL
Chol/HDL Ratio: 5.3 ratio — ABNORMAL HIGH (ref 0.0–4.4)
Cholesterol, Total: 189 mg/dL (ref 100–199)
HDL: 36 mg/dL — ABNORMAL LOW (ref >39)
LDL Calculated: 107 mg/dL — ABNORMAL HIGH (ref 0–99)
TRIGLYCERIDES: 232 mg/dL — AB (ref 0–149)
VLDL Cholesterol Cal: 46 mg/dL — ABNORMAL HIGH (ref 5–40)

## 2018-11-25 LAB — VITAMIN D 25 HYDROXY (VIT D DEFICIENCY, FRACTURES): Vit D, 25-Hydroxy: 45 ng/mL (ref 30.0–100.0)

## 2018-11-25 LAB — VITAMIN B12: Vitamin B-12: 404 pg/mL (ref 232–1245)

## 2018-12-07 ENCOUNTER — Other Ambulatory Visit: Payer: Self-pay | Admitting: Family Medicine

## 2018-12-11 ENCOUNTER — Ambulatory Visit (INDEPENDENT_AMBULATORY_CARE_PROVIDER_SITE_OTHER): Payer: Medicare Other | Admitting: Family Medicine

## 2018-12-11 ENCOUNTER — Ambulatory Visit (INDEPENDENT_AMBULATORY_CARE_PROVIDER_SITE_OTHER): Payer: Medicare Other

## 2018-12-11 VITALS — BP 151/81 | HR 80 | Temp 98.9°F | Ht 60.0 in | Wt 198.0 lb

## 2018-12-11 DIAGNOSIS — S40011A Contusion of right shoulder, initial encounter: Secondary | ICD-10-CM | POA: Diagnosis not present

## 2018-12-11 DIAGNOSIS — S20211A Contusion of right front wall of thorax, initial encounter: Secondary | ICD-10-CM | POA: Diagnosis not present

## 2018-12-11 DIAGNOSIS — S299XXA Unspecified injury of thorax, initial encounter: Secondary | ICD-10-CM | POA: Diagnosis not present

## 2018-12-11 DIAGNOSIS — T148XXA Other injury of unspecified body region, initial encounter: Secondary | ICD-10-CM

## 2018-12-11 DIAGNOSIS — S4991XA Unspecified injury of right shoulder and upper arm, initial encounter: Secondary | ICD-10-CM | POA: Diagnosis not present

## 2018-12-11 DIAGNOSIS — M25511 Pain in right shoulder: Secondary | ICD-10-CM

## 2018-12-11 NOTE — Progress Notes (Signed)
Chief Complaint  Patient presents with  . Shoulder Pain    [address] down steps on Thursday    HPI  Patient presents today for 4 days ago she tripped when she went around step that was curving in her daughter's house.  She fell on a step and hit her right chest she is now complaining of pain in the collarbone area of the upper chest as well as the costal margin at the midclavicular line.  Both of these are on the right.  She has trouble raising the right upper extremity above 90 degrees.  She is having a lot of pain at the collarbone.  It is a sharp pain 6-7/10.  PMH: Smoking status noted ROS: Per HPI  Objective: BP (!) 151/81   Pulse 80   Temp 98.9 F (37.2 C) (Oral)   Ht 5' (1.524 m)   Wt 198 lb (89.8 kg)   BMI 38.67 kg/m  Gen: NAD, alert, cooperative with exam HEENT: NCAT, EOMI, PERRL CV: RRR, good S1/S2, no murmur Resp: CTABL, no wheezes, non-labored Ext: No edema, warm.  The right upper extremity has abduction at the shoulder to 90 degrees with normal rotation.  There is marked tenderness with moderate edema as well at the right mid clavicle region.  There is some tenderness lower in the chest at the mid clavicular line on the right.   Neuro: Alert and oriented, No gross deficits X-ray showed no acute fracture. Assessment and plan:  1. Right shoulder pain, unspecified chronicity   2. Contusion of right clavicle, initial encounter   3. Contusion of right chest wall, initial encounter     Contusion chest wall  Orders Placed This Encounter  Procedures  . DG Shoulder Right    Standing Status:   Future    Number of Occurrences:   1    Standing Expiration Date:   02/10/2020    Order Specific Question:   Reason for Exam (SYMPTOM  OR DIAGNOSIS REQUIRED)    Answer:   shoulder pain    Order Specific Question:   Preferred imaging location?    Answer:   Internal  . DG Chest 2 View    Standing Status:   Future    Number of Occurrences:   1    Standing Expiration Date:   02/10/2020     Order Specific Question:   Reason for Exam (SYMPTOM  OR DIAGNOSIS REQUIRED)    Answer:   fall    Order Specific Question:   Preferred imaging location?    Answer:   Internal    Follow up as needed.  Claretta Fraise, MD

## 2018-12-15 ENCOUNTER — Other Ambulatory Visit: Payer: Self-pay

## 2018-12-15 NOTE — Patient Outreach (Signed)
[facility]) Care Management  12/15/2018  Melissa Bowman Oct 14, 1951 [id number]   Medication Adherence call to Melissa Bowman spoke with patient she is due on Losartan/HCTZ 100/25 mg and Rosuvastatin 5 mg patient said she was receiving it from [facility] but [facility] said its on back order and did not have a date when there going to have it back again patient is receiving it from a local pharmacy but in two separate prescriptions patient prefer to get it in one tablet not 2 different tablets on Losartan /HCTZ. [facility] said they might have in stock soon. Melissa Bowman is showing past due under [facility].    [place] Management Direct Dial [phone]  Fax [phone] [email or web address]

## 2018-12-27 ENCOUNTER — Ambulatory Visit (INDEPENDENT_AMBULATORY_CARE_PROVIDER_SITE_OTHER): Payer: Medicare Other | Admitting: Pharmacist Clinician (PhC)/ Clinical Pharmacy Specialist

## 2018-12-27 DIAGNOSIS — F320 Major depressive disorder, single episode, mild: Secondary | ICD-10-CM | POA: Diagnosis not present

## 2018-12-27 DIAGNOSIS — R4589 Other symptoms and signs involving emotional state: Secondary | ICD-10-CM

## 2018-12-27 MED ORDER — ARIPIPRAZOLE 5 MG PO TABS: 5.0000 mg | ORAL_TABLET | Freq: Every day | ORAL | 3 refills | Status: DC

## 2018-12-27 MED ORDER — ESCITALOPRAM OXALATE 10 MG PO TABS: 10.0000 mg | ORAL_TABLET | Freq: Every day | ORAL | 5 refills | Status: DC

## 2018-12-27 NOTE — Progress Notes (Signed)
Subjective:   Melissa Bowman is an 68 y.o. female who is referred by Dr. Laurance Flatten for pharmacologic evaluation of her antidepressants and a new course of therapy. Onset approximately adolescence but treated with mediations for over 30 years ago, gradually worsening since that time.  Current symptoms include depressed mood, insomnia, psychomotor agitation, difficulty concentrating, anxiety, loss of energy/fatigue, weight gain,.  Current treatment for depression:Medication Sleep problems: Moderate   Early awakening:Absent   Energy: Fair Motivation: Fair Concentration: Fair Rumination/worrying: Moderate Memory: Good Tearfulness: Marked  Anxiety: Mild  Panic: Absent  Overall Mood: Moderately worse  Hopelessness: Mild Suicidal ideation: Absent  Other/Psychosocial Stressors: family Family history positive for depression in the patient's sister(s).  Previous treatment modalities employed include Individual therapy and Medication.  Past episodes of depression:since a teenager Organic causes of depression present: None.  Review of Systems Pertinent items are noted in HPI.   Objective:   Mental Status Examination: Posture and motor behavior: Appropriate Dress, grooming, personal hygiene: Appropriate Facial expression: Appropriate Speech: Appropriate Mood: Positive for crying and teary during visit Coherency and relevance of thought: Appropriate Thought content: Appropriate Perceptions: Appropriate Orientation:Appropriate Attention and concentration: Appropriate Memory: : Appropriate Information: Not examined Vocabulary: Appropriate Abstract reasoning: Appropriate Judgment: Appropriate    Assessment:   Experiencing the following symptoms of depression most of the day nearly every day for more than two consecutive weeks: depressed mood, change in sleep, loss of energy, trouble concentrating.  Patient's daughter, who is an Therapist, sports, stated that patient has bouts of highs and lows and  her mood is not very stable and she can often be angry and erratic.  Depressive Disorder:Patient was well controlled for years on periodic increases in Effexor XR dose, however it has not been providing symptom relief over the past several months with no precipitating new factors.  TSH last checked was normal  Suicide Risk Assessment:  Suicidal intent: none- patient states she has never felt suicidal or had these thoughts.  She helps care for her grandchildren and finds great pleasure in this. Suicidal plan: none Access to means for suicide: no Lethality of means for suicide: no Prior suicide attempts: none Recent exposure to suicide:none    Plan:   No diagnosis found.  Reviewed concept of depression as biochemical imbalance of neurotransmitters and rationale for treatment. Instructed patient to contact office or on-call physician promptly should condition worsen or any new symptoms appear and provided on-call telephone numbers.    1.  Stop Effexor XR 150mg .  Start taking Lexapro 10mg  and Abilify 5mg .    2.  Continue amitriptyline 10mg  qhs- patient is sleeping 8-9 hours a night with no interruptions as long as she takes amitriptyline.  Without it she does not sleep more than a couple of hours with interruptions.  3.  Encouraged patient to go to CBT:  She declines a referral at this time and states she went in the past and received no benefit. I encouraged her to try again with a different counselor since CBT with medication has a higher rate of success.  4.  Patient stopped taking Evista several months ago due to cost.  Her last dexascan showed mild osteopenia so I told her she can continue off treatment until her next dexascan but to continue calcium and vitamin D.  Follow up with patient in 6-8 weeks.  She will call if she has any questions or concerns after starting the new medications.  Total time with patient 60 minutes Memory Argue, PharmD, CPP, CLS

## 2019-02-04 ENCOUNTER — Other Ambulatory Visit: Payer: Self-pay | Admitting: Family Medicine

## 2019-02-07 ENCOUNTER — Ambulatory Visit (INDEPENDENT_AMBULATORY_CARE_PROVIDER_SITE_OTHER): Payer: Medicare Other | Admitting: *Deleted

## 2019-02-07 ENCOUNTER — Other Ambulatory Visit: Payer: Self-pay | Admitting: Pharmacist Clinician (PhC)/ Clinical Pharmacy Specialist

## 2019-02-07 ENCOUNTER — Encounter: Payer: Self-pay | Admitting: *Deleted

## 2019-02-07 VITALS — BP 147/81 | HR 60 | Ht 60.0 in | Wt 201.0 lb

## 2019-02-07 DIAGNOSIS — Z1211 Encounter for screening for malignant neoplasm of colon: Secondary | ICD-10-CM

## 2019-02-07 DIAGNOSIS — Z0000 Encounter for general adult medical examination without abnormal findings: Secondary | ICD-10-CM | POA: Diagnosis not present

## 2019-02-07 DIAGNOSIS — Z23 Encounter for immunization: Secondary | ICD-10-CM

## 2019-02-07 MED ORDER — ESCITALOPRAM OXALATE 20 MG PO TABS: 20.0000 mg | ORAL_TABLET | Freq: Every day | ORAL | 5 refills | Status: DC

## 2019-02-07 NOTE — Patient Instructions (Addendum)
You set a goal of losing 20 lbs.  To work on your goal you plan to watch portion sizes, and continue walking daily.    Please remember to go for your eye exam as soon as possible.  You received your 1st Shingrix vaccine today.  You will need the 2nd dose after 04/08/2019.    Please complete your stool test and return it to our office.   Please continue to move carefully to avoid falls.   Please follow up with Dr. Laurance Flatten as scheduled.  Thank you for coming in for your Annual Wellness Visit today!!   Preventive Care 65 Years and Older, Female Preventive care refers to lifestyle choices and visits with your health care provider that can promote health and wellness. What does preventive care include?  A yearly physical exam. This is also called an annual well check.  Dental exams once or twice a year.  Routine eye exams. Ask your health care provider how often you should have your eyes checked.  Personal lifestyle choices, including: ? Daily care of your teeth and gums. ? Regular physical activity. ? Eating a healthy diet. ? Avoiding tobacco and drug use. ? Limiting alcohol use. ? Practicing safe sex. ? Taking low-dose aspirin every day. ? Taking vitamin and mineral supplements as recommended by your health care provider. What happens during an annual well check? The services and screenings done by your health care provider during your annual well check will depend on your age, overall health, lifestyle risk factors, and family history of disease. Counseling Your health care provider may ask you questions about your:  Alcohol use.  Tobacco use.  Drug use.  Emotional well-being.  Home and relationship well-being.  Sexual activity.  Eating habits.  History of falls.  Memory and ability to understand (cognition).  Work and work Statistician.  Reproductive health.  Screening You may have the following tests or measurements:  Height, weight, and BMI.  Blood  pressure.  Lipid and cholesterol levels. These may be checked every 5 years, or more frequently if you are over 45 years old.  Skin check.  Lung cancer screening. You may have this screening every year starting at age 24 if you have a 30-pack-year history of smoking and currently smoke or have quit within the past 15 years.  Colorectal cancer screening. All adults should have this screening starting at age 12 and continuing until age 59. You will have tests every 1-10 years, depending on your results and the type of screening test. People at increased risk should start screening at an earlier age. Screening tests may include: ? Guaiac-based fecal occult blood testing. ? Fecal immunochemical test (FIT). ? Stool DNA test. ? Virtual colonoscopy. ? Sigmoidoscopy. During this test, a flexible tube with a tiny camera (sigmoidoscope) is used to examine your rectum and lower colon. The sigmoidoscope is inserted through your anus into your rectum and lower colon. ? Colonoscopy. During this test, a long, thin, flexible tube with a tiny camera (colonoscope) is used to examine your entire colon and rectum.  Hepatitis C blood test.  Hepatitis B blood test.  Sexually transmitted disease (STD) testing.  Diabetes screening. This is done by checking your blood sugar (glucose) after you have not eaten for a while (fasting). You may have this done every 1-3 years.  Bone density scan. This is done to screen for osteoporosis. You may have this done starting at age 37.  Mammogram. This may be done every 1-2 years. Talk to  your health care provider about how often you should have regular mammograms. Talk with your health care provider about your test results, treatment options, and if necessary, the need for more tests. Vaccines Your health care provider may recommend certain vaccines, such as:  Influenza vaccine. This is recommended every year.  Tetanus, diphtheria, and acellular pertussis (Tdap, Td)  vaccine. You may need a Td booster every 10 years.  Varicella vaccine. You may need this if you have not been vaccinated.  Zoster vaccine. You may need this after age 77.  Measles, mumps, and rubella (MMR) vaccine. You may need at least one dose of MMR if you were born in 1957 or later. You may also need a second dose.  Pneumococcal 13-valent conjugate (PCV13) vaccine. One dose is recommended after age 49.  Pneumococcal polysaccharide (PPSV23) vaccine. One dose is recommended after age 48.  Meningococcal vaccine. You may need this if you have certain conditions.  Hepatitis A vaccine. You may need this if you have certain conditions or if you travel or work in places where you may be exposed to hepatitis A.  Hepatitis B vaccine. You may need this if you have certain conditions or if you travel or work in places where you may be exposed to hepatitis B.  Haemophilus influenzae type b (Hib) vaccine. You may need this if you have certain conditions. Talk to your health care provider about which screenings and vaccines you need and how often you need them. This information is not intended to replace advice given to you by your health care provider. Make sure you discuss any questions you have with your health care provider. Document Released: 12/26/2015 Document Revised: 01/19/2018 Document Reviewed: 09/30/2015 Elsevier Interactive Patient Education  2019 [place] Prevention in the Home, Adult Falls can cause injuries. They can happen to people of all ages. There are many things you can do to make your home safe and to help prevent falls. Ask for help when making these changes, if needed. What actions can I take to prevent falls? General Instructions  Use good lighting in all rooms. Replace any light bulbs that burn out.  Turn on the lights when you go into a dark area. Use night-lights.  Keep items that you use often in easy-to-reach places. Lower the shelves around your  home if necessary.  Set up your furniture so you have a clear path. Avoid moving your furniture around.  Do not have throw rugs and other things on the floor that can make you trip.  Avoid walking on wet floors.  If any of your floors are uneven, fix them.  Add color or contrast paint or tape to clearly mark and help you see: ? Any grab bars or handrails. ? First and last steps of stairways. ? Where the edge of each step is.  If you use a stepladder: ? Make sure that it is fully opened. Do not climb a closed stepladder. ? Make sure that both sides of the stepladder are locked into place. ? Ask someone to hold the stepladder for you while you use it.  If there are any pets around you, be aware of where they are. What can I do in the bathroom?      Keep the floor dry. Clean up any water that spills onto the floor as soon as it happens.  Remove soap buildup in the tub or shower regularly.  Use non-skid mats or decals on the floor of the tub  or shower.  Attach bath mats securely with double-sided, non-slip rug tape.  If you need to sit down in the shower, use a plastic, non-slip stool.  Install grab bars by the toilet and in the tub and shower. Do not use towel bars as grab bars. What can I do in the bedroom?  Make sure that you have a light by your bed that is easy to reach.  Do not use any sheets or blankets that are too big for your bed. They should not hang down onto the floor.  Have a firm chair that has side arms. You can use this for support while you get dressed. What can I do in the kitchen?  Clean up any spills right away.  If you need to reach something above you, use a strong step stool that has a grab bar.  Keep electrical cords out of the way.  Do not use floor polish or wax that makes floors slippery. If you must use wax, use non-skid floor wax. What can I do with my stairs?  Do not leave any items on the stairs.  Make sure that you have a light  switch at the top of the stairs and the bottom of the stairs. If you do not have them, ask someone to add them for you.  Make sure that there are handrails on both sides of the stairs, and use them. Fix handrails that are broken or loose. Make sure that handrails are as long as the stairways.  Install non-slip stair treads on all stairs in your home.  Avoid having throw rugs at the top or bottom of the stairs. If you do have throw rugs, attach them to the floor with carpet tape.  Choose a carpet that does not hide the edge of the steps on the stairway.  Check any carpeting to make sure that it is firmly attached to the stairs. Fix any carpet that is loose or worn. What can I do on the outside of my home?  Use bright outdoor lighting.  Regularly fix the edges of walkways and driveways and fix any cracks.  Remove anything that might make you trip as you walk through a door, such as a raised step or threshold.  Trim any bushes or trees on the path to your home.  Regularly check to see if handrails are loose or broken. Make sure that both sides of any steps have handrails.  Install guardrails along the edges of any raised decks and porches.  Clear walking paths of anything that might make someone trip, such as tools or rocks.  Have any leaves, snow, or ice cleared regularly.  Use sand or salt on walking paths during winter.  Clean up any spills in your garage right away. This includes grease or oil spills. What other actions can I take?  Wear shoes that: ? Have a low heel. Do not wear high heels. ? Have rubber bottoms. ? Are comfortable and fit you well. ? Are closed at the toe. Do not wear open-toe sandals.  Use tools that help you move around (mobility aids) if they are needed. These include: ? Canes. ? Walkers. ? Scooters. ? Crutches.  Review your medicines with your doctor. Some medicines can make you feel dizzy. This can increase your chance of falling. Ask your doctor  what other things you can do to help prevent falls. Where to find more information  Centers for Disease Control and Prevention, STEADI: https://garcia.biz/  [employer] on  Aging: BrainJudge.co.uk Contact a doctor if:  You are afraid of falling at home.  You feel weak, drowsy, or dizzy at home.  You fall at home. Summary  There are many simple things that you can do to make your home safe and to help prevent falls.  Ways to make your home safe include removing tripping hazards and installing grab bars in the bathroom.  Ask for help when making these changes in your home. This information is not intended to replace advice given to you by your health care provider. Make sure you discuss any questions you have with your health care provider. Document Released: 09/25/2009 Document Revised: 07/14/2017 Document Reviewed: 07/14/2017 Elsevier Interactive Patient Education  2019 [employer].

## 2019-02-07 NOTE — Progress Notes (Addendum)
Subjective:   Melissa Bowman is a 68 y.o. female who presents for a Initial Medicare Annual Wellness Visit.  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  She enjoys reading, doing saduko puzzles, and walking for exercise.  She lives at home with her husband.  They have 2 adult daughters, and 5 grandchildren.  Melissa Bowman stays active babysitting her youngest 2 grandchildren most weekdays.   Patient Care Team: Ernestina Penna, MD as PCP - General (Family Medicine) Jene Every, MD as Consulting Physician (Orthopedic Surgery) Laqueta Linden, MD as Attending Physician (Cardiology)  Hospitalizations, surgeries, and ER visits in previous 12 months No hospitalizations or ER visits in the past year.  She had one surgery 01/2018 for rotator cuff repair.   Review of Systems    Patient reports that her overall health is unchanged compared to last year.  Cardiac Risk Factors include: advanced age (>60men, >51 women);dyslipidemia;hypertension;obesity (BMI >30kg/m2)   All other systems negative       Current Medications (verified) Outpatient Encounter Medications as of 02/07/2019  Medication Sig  . amitriptyline (ELAVIL) 10 MG tablet TAKE 1 TABLET BY MOUTH AT  BEDTIME  . amLODipine (NORVASC) 5 MG tablet TAKE 1 TABLET BY MOUTH  DAILY  . ARIPiprazole (ABILIFY) 5 MG tablet Take 1 tablet (5 mg total) by mouth daily.  . Ascorbic Acid (VITAMIN C) 1000 MG tablet Take 1,000 mg by mouth daily.  Marland Kitchen aspirin EC 81 MG tablet Take 1 tablet (81 mg total) by mouth daily.  . Calcium Carb-Cholecalciferol (CALCIUM 600+D3) 600-800 MG-UNIT TABS Take 2 tablets by mouth daily.  . Cholecalciferol (VITAMIN D-3) 5000 UNITS TABS Take 5,000 Units by mouth daily.   . fenofibrate 160 MG tablet TAKE 1 TABLET BY MOUTH  DAILY  . fluticasone (FLONASE) 50 MCG/ACT nasal spray Use 2 sprays in each  nostril daily (Patient taking differently: Use 2 sprays in each   nostril daily as needed for congestion)  . glucose blood (ONE TOUCH ULTRA TEST) test strip TEST BLOOD SUGAR 3 TIMES A DAY  . hydrochlorothiazide (HYDRODIURIL) 25 MG tablet TAKE 1 TABLET DAILY  . losartan (COZAAR) 100 MG tablet TAKE 1 TABLET DAILY  . metFORMIN (GLUCOPHAGE) 1000 MG tablet TAKE 1 TABLET BY MOUTH TWO  TIMES DAILY WITH MEALS  . montelukast (SINGULAIR) 10 MG tablet TAKE 1 TABLET BY MOUTH  DAILY  . mupirocin ointment (BACTROBAN) 2 % Apply 1 application topically 2 (two) times daily.  Marland Kitchen omeprazole (PRILOSEC) 40 MG capsule TAKE 1 CAPSULE BY MOUTH  DAILY  . ONETOUCH VERIO test strip TEST 3 TIMES A DAY  . PROAIR HFA 108 (90 BASE) MCG/ACT inhaler Use 2 puffs into the lungs  every 6 hours as needed for wheezing or shortness of  breath (Patient taking differently: Use 2 puffs into the lungs  every 6 hours as needed for wheezing or shortness of  breath (spring time ONLY))  . SYMBICORT 160-4.5 MCG/ACT inhaler Use 2 puffs two times daily (Patient taking differently: Use 2 puffs two times daily as needed (springtime ONLY))  . [DISCONTINUED] escitalopram (LEXAPRO) 10 MG tablet Take 1 tablet (10 mg total) by mouth daily. Stop taking Effexor   No facility-administered encounter medications on file as of 02/07/2019.     Allergies (verified) Statins; E-mycin [erythromycin]; Estrogens; Sulfa antibiotics; and Vioxx [rofecoxib]   History: Past Medical History:  Diagnosis Date  . Adrenal adenoma    left side   .  Allergic rhinitis   . 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 Anxiety and depression   . Asthmatic bronchitis   . Bilateral carpal tunnel syndrome   . Condyloma acuminata   . Diabetes mellitus without complication (HCC)   . Diverticulosis   . Eczema    STABLE   . GERD (gastroesophageal reflux disease)   . H. pylori infection 11/16/1993  . Hiatal hernia   . History of benign adrenal tumor 2007  . History of benign adrenal tumor [facility]   [facility]   . History of pneumonia 2007, 2008  . Hyperlipidemia     . Hypertension   . IBS (irritable bowel syndrome)   . Internal hemorrhoids   . Kyphosis   . Memory loss   . Metabolic syndrome   . Otitis media, serous, acute   . Sleep apnea    uses CPAP  . Symptomatic menopausal or female climacteric states   . Vitamin D deficiency    Past Surgical History:  Procedure Laterality Date  . abdominal wall lematoma of fabdd hys.    . APPENDECTOMY    . CARPAL TUNNEL RELEASE    . CESAREAN SECTION     X2  . INCISIONAL HERNIA REPAIR    . right breast biopsy  10/96  . ROTATOR CUFF REPAIR  2002   right  . ROTATOR CUFF REPAIR/ bone spur  08/30/09  . SHOULDER ARTHROSCOPY WITH ROTATOR CUFF REPAIR AND SUBACROMIAL DECOMPRESSION Right 01/19/2018   Procedure: REVISION RIGHT MINI OPEN ROTATOR CUFF REPAIR AND SUBACROMIAL DECOMPRESSION;  Surgeon: Jene Every, MD;  Location: [facility];  Service: Orthopedics;  Laterality: Right;  90 MINS  . sleep apnea surgery  5/96  . torn cartiledge rt knee x2  05/13/06   Dr. Trenton Founds   . TOTAL ABDOMINAL HYSTERECTOMY W/ BILATERAL SALPINGOOPHORECTOMY  8/94   fibroids    Family History  Problem Relation Age of Onset  . Parkinsonism Father   . Emphysema Father   . Hip fracture Father   . Heart disease Mother   . COPD Brother   . Hypertension Brother   . Heart disease Brother   . Heart failure Sister   . Ovarian cancer Sister   . COPD Sister   . Lymphoma Sister   . COPD Sister   . COPD Brother    Social History   Socioeconomic History  . Marital status: Married    Spouse name: Not on file  . Number of children: 2  . Years of education: Not on file  . Highest education level: Some college, no degree  Occupational History  . Occupation: housewife  Social Needs  . Financial resource strain: Not hard at all  . Food insecurity:    Worry: Never true    Inability: Never true  . Transportation needs:    Medical: No    Non-medical: No  Tobacco Use  . Smoking status: Never Smoker  . Smokeless tobacco: Never Used   Substance and Sexual Activity  . Alcohol use: No  . Drug use: No  . Sexual activity: Not on file  Lifestyle  . Physical activity:    Days per week: 7 days    Minutes per session: 30 min  . Stress: Only a little  Relationships  . Social connections:    Talks on phone: More than three times a week    Gets together: More than three times a week    Attends religious service: Never    Active member of club or  organization: No    Attends meetings of clubs or organizations: Never    Relationship status: Married  Other Topics Concern  . Not on file  Social History Narrative  . Not on file     Clinical Intake:     Pain Score: 0-No pain                  Activities of Daily Living In your present state of health, do you have any difficulty performing the following activities: 02/07/2019  Hearing? Y  Comment Wears hearing aids  Vision? N  Difficulty concentrating or making decisions? N  Walking or climbing stairs? N  Dressing or bathing? N  Doing errands, shopping? N  Preparing Food and eating ? N  Using the Toilet? N  In the past six months, have you accidently leaked urine? Y  Comment Urinary frequency - wears pad. Has appointment in March with urologist   Do you have problems with loss of bowel control? N  Managing your Medications? N  Managing your Finances? N  Housekeeping or managing your Housekeeping? N  Some recent data might be hidden     Exercise Current Exercise Habits: Home exercise routine, Type of exercise: walking, Time (Minutes): 30, Frequency (Times/Week): 7, Weekly Exercise (Minutes/Week): 210, Intensity: Moderate  Diet Consumes 3 meals a day and 0 snacks a day.  The patient feels that she mostly follow a diabetic diet.  Diet History Mrs. Seas states she tries to avoid fried foods and get adequate servings of vegetables.  She states for breakfast she usually has 2 eggs, soup or sandwich for lunch, and 2 vegetables and a meat for supper.   She states she also avoids sweets.  Mrs. Spainhower states she has access to all the food she needs.      Depression Screen PHQ 2/9 Scores 02/07/2019 12/11/2018 11/24/2018 05/25/2018 11/24/2017 08/05/2017 12/28/2016  PHQ - 2 Score 0 0 1 0 0 0 1     Fall Risk Fall Risk  02/07/2019 12/11/2018 11/24/2018 05/25/2018 11/24/2017  Falls in the past year? 1 1 0 No Yes  Number falls in past yr: 0 0 - - 1  Injury with Fall? 1 1 - - Yes  Comment - shoulder pain - - -  Risk for fall due to : Impaired balance/gait - - - -  Follow up Falls prevention discussed;Education provided - - - -     Objective:    Today's Vitals   02/07/19 1005 02/07/19 1401  BP: (!) 167/77 (!) 147/81  Pulse: 60 60  Weight: 201 lb (91.2 kg)   Height: 5' (1.524 m)   PainSc: 0-No pain    Body mass index is 39.26 kg/m.  Advanced Directives 02/07/2019 01/19/2018 01/12/2018  Does Patient Have a Medical Advance Directive? No No No  Would patient like information on creating a medical advance directive? Yes (MAU/Ambulatory/Procedural Areas - Information given) No - Patient declined No - Patient declined    Hearing/Vision  No hearing or vision deficits noted during visit.  Cognitive Function: MMSE - Mini Mental State Exam 02/07/2019  Orientation to time 5  Orientation to Place 5  Registration 3  Attention/ Calculation 5  Recall 3  Language- name 2 objects 2  Language- repeat 1  Language- follow 3 step command 3  Language- read & follow direction 1  Write a sentence 1  Copy design 1  Total score 30          Immunizations and Health Maintenance Immunization  History  Administered Date(s) Administered  . Influenza, High Dose Seasonal PF 10/06/2018  . Influenza,inj,Quad PF,6+ Mos 09/28/2013, 09/27/2014, 10/15/2015, 11/03/2016  . Pneumococcal Conjugate-13 05/25/2018  . Tdap 09/27/2014  . Zoster 01/31/2013  . Zoster Recombinat (Shingrix) 02/07/2019   Health Maintenance Due  Topic Date Due  . OPHTHALMOLOGY EXAM   01/04/2018  . COLON CANCER SCREENING ANNUAL FOBT  09/15/2018   Patient is aware that she is due for an eye exam.  She goes to [facility] in [place], [place].  Offered to call to schedule patient an appointment, but she states she will call to schedule.  FOBT given for patient to complete and return to office for testing.    Health Maintenance  Topic Date Due  . OPHTHALMOLOGY EXAM  01/04/2018  . COLON CANCER SCREENING ANNUAL FOBT  09/15/2018  . Hepatitis C Screening  03/28/2024 (Originally 23-Dec-1950)  . HEMOGLOBIN A1C  05/26/2019  . PNA vac Low Risk Adult (2 of 2 - PPSV23) 05/26/2019  . FOOT EXAM  11/25/2019  . MAMMOGRAM  06/05/2020  . COLONOSCOPY  09/14/2021  . TETANUS/TDAP  09/27/2024  . INFLUENZA VACCINE  Completed  . DEXA SCAN  Completed        Assessment:   This is a routine wellness examination for Theodore.    Plan:    Goals    . Weight (lb) < 180 lb (81.6 kg)     Watch portion sizes and continue walking for exercise daily.         Health Maintenance & Additional Screening Recommendations  Advanced directives: has NO advanced directive  - add't info requested. Referral to SW: no  Lung: Low Dose CT Chest recommended if Age 34-80 years, 30 pack-year currently smoking OR have quit w/in 15years. Patient does not qualify. Hepatitis C Screening recommended: yes   Today's Orders Orders Placed This Encounter  Procedures  . Fecal occult blood, imunochemical    Standing Status:   Future    Standing Expiration Date:   02/08/2020  . Varicella-zoster vaccine IM (Shingrix)    Keep f/u with Ernestina Penna, MD and any other specialty appointments you may have Continue current medications Move carefully to avoid falls. Aim for at least 150 minutes of moderate activity a week.  Read or work on puzzles daily Stay connected with friends and family   I have personally reviewed and noted the following in the patient's chart:   . Medical and social history . Use of  alcohol, tobacco or illicit drugs  . Current medications and supplements . Functional ability and status . Nutritional status . Physical activity . Advanced directives . List of other physicians . Hospitalizations, surgeries, and ER visits in previous 12 months . Vitals . Screenings to include cognitive, depression, and falls . Referrals and appointments  In addition, I have reviewed and discussed with patient certain preventive protocols, quality metrics, and best practice recommendations. A written personalized care plan for preventive services as well as general preventive health recommendations were provided to patient.     Lilia Argue, RN  02/07/2019  I have reviewed and agree with the above AWV documentation.   Nyra Capes MD

## 2019-02-10 ENCOUNTER — Other Ambulatory Visit: Payer: Self-pay | Admitting: Family Medicine

## 2019-02-26 ENCOUNTER — Telehealth: Payer: Self-pay | Admitting: *Deleted

## 2019-02-26 MED ORDER — AMITRIPTYLINE HCL 10 MG PO TABS: 10.0000 mg | ORAL_TABLET | Freq: Every day | ORAL | 2 refills | Status: DC

## 2019-02-26 MED ORDER — MONTELUKAST SODIUM 10 MG PO TABS: 10.0000 mg | ORAL_TABLET | Freq: Every day | ORAL | 2 refills | Status: DC

## 2019-02-26 MED ORDER — FENOFIBRATE 160 MG PO TABS: 160.0000 mg | ORAL_TABLET | Freq: Every day | ORAL | 2 refills | Status: DC

## 2019-02-26 MED ORDER — AMLODIPINE BESYLATE 5 MG PO TABS: 5.0000 mg | ORAL_TABLET | Freq: Every day | ORAL | 2 refills | Status: DC

## 2019-02-26 MED ORDER — METFORMIN HCL 1000 MG PO TABS: 1000.0000 mg | ORAL_TABLET | Freq: Two times a day (BID) | ORAL | 2 refills | Status: DC

## 2019-02-26 NOTE — Telephone Encounter (Signed)
Refills sent to pharmacy. 

## 2019-03-07 ENCOUNTER — Ambulatory Visit: Payer: Medicare Other | Admitting: Urology

## 2019-03-07 ENCOUNTER — Encounter: Payer: Self-pay | Admitting: Family Medicine

## 2019-03-07 ENCOUNTER — Telehealth: Payer: Medicare Other | Admitting: Family Medicine

## 2019-03-07 ENCOUNTER — Telehealth: Payer: Self-pay

## 2019-03-07 DIAGNOSIS — R300 Dysuria: Secondary | ICD-10-CM | POA: Diagnosis not present

## 2019-03-07 DIAGNOSIS — R509 Fever, unspecified: Secondary | ICD-10-CM

## 2019-03-07 DIAGNOSIS — M549 Dorsalgia, unspecified: Secondary | ICD-10-CM

## 2019-03-07 LAB — URINALYSIS, COMPLETE
Bilirubin, UA: NEGATIVE
Glucose, UA: NEGATIVE
Ketones, UA: NEGATIVE
NITRITE UA: NEGATIVE
Protein, UA: NEGATIVE
Specific Gravity, UA: 1.015 (ref 1.005–1.030)
Urobilinogen, Ur: 0.2 mg/dL (ref 0.2–1.0)
pH, UA: 6 (ref 5.0–7.5)

## 2019-03-07 LAB — MICROSCOPIC EXAMINATION: WBC, UA: >30 /hpf — AB (ref 0–5)

## 2019-03-07 MED ORDER — CIPROFLOXACIN HCL 500 MG PO TABS: 500.0000 mg | ORAL_TABLET | Freq: Two times a day (BID) | ORAL | 0 refills | Status: DC

## 2019-03-07 NOTE — Telephone Encounter (Signed)
Dysuria and frequency x 1 day. Feels like previous UTI. No back pain related to UTI and no difficulty passing urine. Had an appointment for f/u with [facility] today and was going to talk with them but they cancelled it due to [place].   Husband did an evisit on her behalf but answered that she had back pain and a fever. She has chronic back pain. No fever today. They recommended further follow up due to the answers.   I was going to route this to Dr Laurance Flatten but he left for the day. Patient left a sterile urine specimen and I ordered a u/a and culture. Cipro has worked in the past. Would you consider sending in a script for Cipro to [facility]?

## 2019-03-07 NOTE — Progress Notes (Signed)
Based on what you shared with me, I feel your condition warrants further evaluation and I recommend that you be seen for a face to face office visit. I would reach back out to urology to see if they will allow you to leave a urine sample for evaluation related to your symptoms or use one of the face to face locations below to further evaluation. Apologies we were not able to service you today via e-visits.     NOTE: If you entered your credit card information for this eVisit, you will not be charged. You may see a "hold" on your card for the $35 but that hold will drop off and you will not have a charge processed.  If you are having a true medical emergency please call 911.  If you need an urgent face to face visit, [facility] has four urgent care centers for your convenience.    PLEASE NOTE: THE [facility] LOCATIONS AND URGENT CARE CLINICS DO NOT HAVE THE TESTING FOR CORONAVIRUS COVID19 AVAILABLE.  IF YOU FEEL YOU NEED THIS TEST YOU MUST HAVE AN ORDER TO GO TO A TESTING LOCATION FROM YOUR PROVIDER OR FROM A SCREENING E-VISIT   The following sites will take your insurance:  . [facility] Urgent [place] a Provider at this Location  [address] [place], [place] [address] . 10 am to 8 pm Monday-Friday . 12 pm to 8 pm Saturday-Sunday   . [facility] Urgent Care at [place] a Provider at this Location  [address], [place], [place] [address] . 8 am to 8 pm Monday-Friday . 9 am to 6 pm Saturday . 11 am to 6 pm Sunday   . [facility] Health Urgent Care at [place] Get Driving Directions  [address] [place], [place] [address] . 8 am to 8 pm Monday-Friday . 8 am to 4 pm Saturday-Sunday   Your e-visit answers were reviewed by a board certified advanced clinical practitioner to complete your personal care plan.  Thank you for using  e-Visits.  Based on what you shared with me, I feel your condition warrants further evaluation and I recommend that you be seen for a face to face office visit. I would reach back out to urology to see if they will allow you to leave a urine sample for evaluation related to your symptoms or use one of the face to face locations below to further evaluation. Apologies we were not able to service you today via e-visits.     NOTE: If you entered your credit card information for this eVisit, you will not be charged. You may see a "hold" on your card for the $35 but that hold will drop off and you will not have a charge processed.  If you are having a true medical emergency please call 911.  If you need an urgent face to face visit, [facility] has four urgent care centers for your convenience.    PLEASE NOTE: THE [facility] LOCATIONS AND URGENT CARE CLINICS DO NOT HAVE THE TESTING FOR CORONAVIRUS COVID19 AVAILABLE.  IF YOU FEEL YOU NEED THIS TEST YOU MUST HAVE AN ORDER TO GO TO A TESTING LOCATION FROM YOUR PROVIDER OR FROM A SCREENING E-VISIT   The following sites will take your insurance:  . [facility] Health Urgent [place] a Provider at this Location  [address], [place] [address] . 10 am to 8 pm Monday-Friday . 12 pm to 8 pm Saturday-Sunday   . [facility] Health Urgent Care at [place] a Provider at this Location  [address], [place], [place] [address] . 8 am to 8 pm Monday-Friday . 9 am to 6 pm Saturday . 11 am to 6 pm Sunday   . [facility] Urgent Care at [place] Get Driving Directions  [address] [place], [place] [address] . 8 am to 8 pm Monday-Friday . 8 am to 4 pm Saturday-Sunday   Your e-visit answers were reviewed by a board certified advanced clinical practitioner to complete your personal care plan.  Thank you for  using e-Visits.

## 2019-03-09 LAB — URINE CULTURE

## 2019-03-26 ENCOUNTER — Other Ambulatory Visit: Payer: Medicare Other

## 2019-03-26 ENCOUNTER — Other Ambulatory Visit: Payer: Self-pay

## 2019-03-26 DIAGNOSIS — Z1211 Encounter for screening for malignant neoplasm of colon: Secondary | ICD-10-CM

## 2019-03-27 LAB — FECAL OCCULT BLOOD, IMMUNOCHEMICAL: Fecal Occult Bld: NEGATIVE

## 2019-04-02 ENCOUNTER — Ambulatory Visit (INDEPENDENT_AMBULATORY_CARE_PROVIDER_SITE_OTHER): Payer: Medicare Other | Admitting: Family Medicine

## 2019-04-02 ENCOUNTER — Other Ambulatory Visit: Payer: Self-pay

## 2019-04-02 DIAGNOSIS — F320 Major depressive disorder, single episode, mild: Secondary | ICD-10-CM

## 2019-04-02 DIAGNOSIS — M150 Primary generalized (osteo)arthritis: Secondary | ICD-10-CM

## 2019-04-02 DIAGNOSIS — I10 Essential (primary) hypertension: Secondary | ICD-10-CM | POA: Diagnosis not present

## 2019-04-02 DIAGNOSIS — E1169 Type 2 diabetes mellitus with other specified complication: Secondary | ICD-10-CM

## 2019-04-02 DIAGNOSIS — G4737 Central sleep apnea in conditions classified elsewhere: Secondary | ICD-10-CM

## 2019-04-02 DIAGNOSIS — E559 Vitamin D deficiency, unspecified: Secondary | ICD-10-CM | POA: Diagnosis not present

## 2019-04-02 DIAGNOSIS — K219 Gastro-esophageal reflux disease without esophagitis: Secondary | ICD-10-CM

## 2019-04-02 DIAGNOSIS — E119 Type 2 diabetes mellitus without complications: Secondary | ICD-10-CM

## 2019-04-02 DIAGNOSIS — M159 Polyosteoarthritis, unspecified: Secondary | ICD-10-CM | POA: Insufficient documentation

## 2019-04-02 DIAGNOSIS — K625 Hemorrhage of anus and rectum: Secondary | ICD-10-CM

## 2019-04-02 DIAGNOSIS — E785 Hyperlipidemia, unspecified: Secondary | ICD-10-CM

## 2019-04-02 MED ORDER — ARIPIPRAZOLE 5 MG PO TABS: 5.0000 mg | ORAL_TABLET | Freq: Every day | ORAL | 3 refills | Status: DC

## 2019-04-02 MED ORDER — ESCITALOPRAM OXALATE 20 MG PO TABS: 20.0000 mg | ORAL_TABLET | Freq: Every day | ORAL | 3 refills | Status: DC

## 2019-04-02 NOTE — Addendum Note (Signed)
Addended by: Zannie Cove on: 04/02/2019 01:32 PM   Modules accepted: Orders

## 2019-04-02 NOTE — Progress Notes (Signed)
Virtual Visit Via telephone Note I connected with@ on 04/02/19 by telephone and verified that I am speaking with the correct person or authorized healthcare agent using two identifiers. Melissa Bowman is currently located at home and there are no unauthorized people in close proximity. I completed this visit while in a private location in my home .  I connected to the patient by telephone and verified that I am speaking with the correct person.  This visit type was conducted due to national recommendations for restrictions regarding the COVID-19 Pandemic (e.g. social distancing).  This format is felt to be most appropriate for this patient at this time.  All issues noted in this document were discussed and addressed.  No physical exam was performed.    I discussed the limitations, risks, security and privacy concerns of performing an evaluation and management service by telephone and the availability of in person appointments. I also discussed with the patient that there may be a patient responsible charge related to this service. The patient expressed understanding and agreed to proceed.   Date:  04/02/2019    ID:  [id number] DUGAY      21-Jun-1951        [id number]   Patient Care Team Patient Care Team: Ernestina Penna, MD as PCP - General (Family Medicine) Jene Every, MD as Consulting Physician (Orthopedic Surgery) Laqueta Linden, MD as Attending Physician (Cardiology)  Reason for Visit: Primary Care Follow-up     History of Present Illness & Review of Systems:     Melissa Bowman is a 68 y.o. year old female primary care patient that presents today for a telehealth visit.  The patient is alert and doing well overall.  She denies any chest pain pressure tightness or shortness of breath.  She denies any trouble with her stomach with her reflux or with change in bowel habits.  She occasionally has problems with her hemorrhoids but this does not persist.  She is passing her  water well and has had a recent urinary tract infection.  She says her blood pressures at home is been running in the 120s over the 70s.  Her weight is staying at about 198 and admits to needing to be more active with walking.  Her blood sugars at home have been running in the low 100s.  She says her feet are fine with no signs of any infection.  With taking the Abilify and the Lexapro her mood and affect are much much better and we can be thankful to the visit to the clinical pharmacist for this help with change in these medicines.  She does need a prescription called into [facility] to get a 90-day supply.  She has had problems with her joints but she does not have any complaints with those today.  Review of systems as stated otherwise negative for body systems left unmentioned.   The patient does not have symptoms concerning for COVID-19 infection (fever, chills, cough, or new shortness of breath).      Current Medications (Verified) Allergies as of 04/02/2019      Reactions   Statins Other (See Comments)   myalgias   E-mycin [erythromycin] Other (See Comments)   Medication intolerance stomach cramps   Estrogens Other (See Comments)   Estrogen replacement Therapy intolerance mood changes   Sulfa Antibiotics Other (See Comments)   Causes severe stomach cramps    Vioxx [rofecoxib] Other (See Comments)  Unknown reaction/interaction. Possible stomach cramps      Medication List       Accurate as of April 02, 2019  8:42 AM. Always use your most recent med list.        amitriptyline 10 MG tablet Commonly known as:  ELAVIL Take 1 tablet (10 mg total) by mouth at bedtime.   amLODipine 5 MG tablet Commonly known as:  NORVASC Take 1 tablet (5 mg total) by mouth daily.   ARIPiprazole 5 MG tablet Commonly known as:  Abilify Take 1 tablet (5 mg total) by mouth daily.   aspirin EC 81 MG tablet Take 1 tablet (81 mg total) by mouth daily.   Calcium  600+D3 600-800 MG-UNIT Tabs Generic drug:  Calcium Carb-Cholecalciferol Take 2 tablets by mouth daily.   ciprofloxacin 500 MG tablet Commonly known as:  Cipro Take 1 tablet (500 mg total) by mouth 2 (two) times daily.   escitalopram 20 MG tablet Commonly known as:  Lexapro Take 1 tablet (20 mg total) by mouth daily.   fenofibrate 160 MG tablet Take 1 tablet (160 mg total) by mouth daily.   fluticasone 50 MCG/ACT nasal spray Commonly known as:  FLONASE Use 2 sprays in each  nostril daily   glucose blood test strip Commonly known as:  ONE TOUCH ULTRA TEST TEST BLOOD SUGAR 3 TIMES A DAY   OneTouch Verio test strip Generic drug:  glucose blood TEST 3 TIMES A DAY   hydrochlorothiazide 25 MG tablet Commonly known as:  HYDRODIURIL TAKE 1 TABLET DAILY   losartan 100 MG tablet Commonly known as:  COZAAR TAKE 1 TABLET DAILY   metFORMIN 1000 MG tablet Commonly known as:  GLUCOPHAGE Take 1 tablet (1,000 mg total) by mouth 2 (two) times daily with a meal.   montelukast 10 MG tablet Commonly known as:  SINGULAIR Take 1 tablet (10 mg total) by mouth daily.   mupirocin ointment 2 % Commonly known as:  Bactroban Apply 1 application topically 2 (two) times daily.   omeprazole 40 MG capsule Commonly known as:  PRILOSEC TAKE 1 CAPSULE BY MOUTH  DAILY   ProAir HFA 108 (90 Base) MCG/ACT inhaler Generic drug:  albuterol Use 2 puffs into the lungs  every 6 hours as needed for wheezing or shortness of  breath   Symbicort 160-4.5 MCG/ACT inhaler Generic drug:  budesonide-formoterol Use 2 puffs two times daily   vitamin C 1000 MG tablet Take 1,000 mg by mouth daily.   Vitamin D-3 125 MCG (5000 UT) Tabs Take 5,000 Units by mouth daily.           Allergies (Verified)    Statins; E-mycin [erythromycin]; Estrogens; Sulfa antibiotics; and Vioxx [rofecoxib]  Past Medical History Past Medical History:  Diagnosis Date  . Adrenal adenoma    left side   . Allergic rhinitis    . 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 Anxiety and depression   . Asthmatic bronchitis   . Bilateral carpal tunnel syndrome   . Condyloma acuminata   . Diabetes mellitus without complication (HCC)   . Diverticulosis   . Eczema    STABLE   . GERD (gastroesophageal reflux disease)   . H. pylori infection 11/16/1993  . Hiatal hernia   . History of benign adrenal tumor 2007  . History of benign adrenal tumor [facility]   [facility]   . History of pneumonia 2007, 2008  . Hyperlipidemia   . Hypertension   . IBS (irritable bowel syndrome)   .  Internal hemorrhoids   . Kyphosis   . Memory loss   . Metabolic syndrome   . Otitis media, serous, acute   . Sleep apnea    uses CPAP  . Symptomatic menopausal or female climacteric states   . Vitamin D deficiency      Past Surgical History:  Procedure Laterality Date  . abdominal wall lematoma of fabdd hys.    . APPENDECTOMY    . CARPAL TUNNEL RELEASE    . CESAREAN SECTION     X2  . INCISIONAL HERNIA REPAIR    . right breast biopsy  10/96  . ROTATOR CUFF REPAIR  2002   right  . ROTATOR CUFF REPAIR/ bone spur  08/30/09  . SHOULDER ARTHROSCOPY WITH ROTATOR CUFF REPAIR AND SUBACROMIAL DECOMPRESSION Right 01/19/2018   Procedure: REVISION RIGHT MINI OPEN ROTATOR CUFF REPAIR AND SUBACROMIAL DECOMPRESSION;  Surgeon: Jene Every, MD;  Location: [facility];  Service: Orthopedics;  Laterality: Right;  90 MINS  . sleep apnea surgery  5/96  . torn cartiledge rt knee x2  05/13/06   Dr. Trenton Founds   . TOTAL ABDOMINAL HYSTERECTOMY W/ BILATERAL SALPINGOOPHORECTOMY  8/94   fibroids     Social History   Socioeconomic History  . Marital status: Married    Spouse name: Not on file  . Number of children: 2  . Years of education: Not on file  . Highest education level: Some college, no degree  Occupational History  . Occupation: housewife  Social Needs  . Financial resource strain: Not hard at all  . Food insecurity:    Worry: Never true    Inability: Never true  .  Transportation needs:    Medical: No    Non-medical: No  Tobacco Use  . Smoking status: Never Smoker  . Smokeless tobacco: Never Used  Substance and Sexual Activity  . Alcohol use: No  . Drug use: No  . Sexual activity: Not on file  Lifestyle  . Physical activity:    Days per week: 7 days    Minutes per session: 30 min  . Stress: Only a little  Relationships  . Social connections:    Talks on phone: More than three times a week    Gets together: More than three times a week    Attends religious service: Never    Active member of club or organization: No    Attends meetings of clubs or organizations: Never    Relationship status: Married  Other Topics Concern  . Not on file  Social History Narrative  . Not on file     Family History  Problem Relation Age of Onset  . Parkinsonism Father   . Emphysema Father   . Hip fracture Father   . Heart disease Mother   . COPD Brother   . Hypertension Brother   . Heart disease Brother   . Heart failure Sister   . Ovarian cancer Sister   . COPD Sister   . Lymphoma Sister   . COPD Sister   . COPD Brother       Labs/Other Tests and Data Reviewed:    Wt Readings from Last 3 Encounters:  02/07/19 201 lb (91.2 kg)  12/11/18 198 lb (89.8 kg)  11/24/18 198 lb (89.8 kg)   Temp Readings from Last 3 Encounters:  12/11/18 98.9 F (37.2 C) (Oral)  11/24/18 99.3 F (37.4 C) (Oral)  05/25/18 98.4 F (36.9 C) (Oral)   BP Readings from Last 3 Encounters:  02/07/19 (!) 147/81  12/11/18 (!) 151/81  11/24/18 127/69   Pulse Readings from Last 3 Encounters:  02/07/19 60  12/11/18 80  11/24/18 68     Lab Results  Component Value Date   HGBA1C 6.0 11/24/2018   HGBA1C 6.2 05/25/2018   HGBA1C 6.3 (H) 01/12/2018   Lab Results  Component Value Date   MICROALBUR 20 03/12/2015   LDLCALC 107 (H) 11/24/2018   CREATININE 0.58 11/24/2018       Chemistry      Component Value Date/Time   NA 140 11/24/2018 1249   K 4.2  11/24/2018 1249   CL 100 11/24/2018 1249   CO2 22 11/24/2018 1249   BUN 16 11/24/2018 1249   CREATININE 0.58 11/24/2018 1249   CREATININE 0.54 05/09/2013 1052      Component Value Date/Time   CALCIUM 9.7 11/24/2018 1249   ALKPHOS 47 11/24/2018 1249   AST 29 11/24/2018 1249   ALT 40 (H) 11/24/2018 1249   BILITOT 0.4 11/24/2018 1249         OBSERVATIONS/ OBJECTIVE:     The patient is alert and we reviewed all her medicines and her problem list.  She concurred with everything and only needs her Abilify and Lexapro called into her mail order pharmacy.  She denied any particular complaints other than needing to get out and get more exercise and walking.  She relayed her vital signs to me from home and they are listed above.  We will plan to see her back again in about 4 months and she will call sooner if necessary.  She will come by the office and let us know ahead of time so we can get routine lab work on her including a hemoglobin A1c.  Physical exam deferred due to nature of telephonic visit.  ASSESSMENT & PLAN    Time:   Today, I have spent 25 minutes with the patient via telephone discussing the above including Covid precautions.     Visit Diagnoses: 1. Hyperlipidemia associated with type 2 diabetes mellitus (HCC) -Patient is statin intolerant but does continue to take fenofibrate.  When her lab work is return if her triglycerides are elevated we will certainly want to consider switching her to Vascepa because this has evidence to support reduction in heart attack and stroke.  2. Essential hypertension -Pressures are good at home and she will continue with current treatment  3. Vitamin D deficiency -Continue with vitamin D replacement pending results of lab work  4. Gastroesophageal reflux disease, esophagitis presence not specified -Continue with omeprazole  5. Morbid obesity (HCC) -The patient indicates that her weight is still about 198 and she will try to do better  with exercise and diet to reduce weight and risk factors.  6. Central sleep apnea due to medical condition -Continue with CPAP  7. BRBPR (bright red blood per rectum) -She continues to have problems with her hemorrhoids off and on  8. Current mild episode of major depressive disorder, unspecified whether recurrent (HCC) -This seems to be much better with Abilify and Lexapro.  She will continue with these medications.  Her mood and affect are much improved.  9. Primary osteoarthritis involving multiple joints -Walking will certainly be helpful for this and she is encouraged to do this.  Patient Instructions  The patient should continue to practice good respiratory and hand hygiene in the midst of COVID-19 pandemic She should continue to drink plenty of water and fluids and to stay as active as possible  without being in crowds. She should continue to take her current medicines She should come to the office at a convenient time for lab work and call before she comes to arrange this with my nurse or Melissa Bowman.      The above assessment and management plan was discussed with the patient. The patient verbalized understanding of and has agreed to the management plan. Patient is aware to call the clinic if symptoms persist or worsen. Patient is aware when to return to the clinic for a follow-up visit. Patient educated on when it is appropriate to go to the emergency department.    Ernestina Penna, MD [facility] [address] [place], [place], [place] [address] Ph [phone]   Nyra Capes MD

## 2019-04-02 NOTE — Patient Instructions (Signed)
The patient should continue to practice good respiratory and hand hygiene in the midst of COVID-19 pandemic She should continue to drink plenty of water and fluids and to stay as active as possible without being in crowds. She should continue to take her current medicines She should come to the office at a convenient time for lab work and call before she comes to arrange this with my nurse or Zigmund Daniel.

## 2019-04-19 ENCOUNTER — Telehealth: Payer: Self-pay | Admitting: *Deleted

## 2019-04-19 MED ORDER — ESCITALOPRAM OXALATE 20 MG PO TABS: 20.0000 mg | ORAL_TABLET | Freq: Every day | ORAL | 0 refills | Status: DC

## 2019-04-19 NOTE — Telephone Encounter (Signed)
Pt will be out of Lexapro today, did not receive with others meds from mail order pharmacy, would like 30 day supply sent to [facility]. Pt aware refill sent

## 2019-04-24 ENCOUNTER — Other Ambulatory Visit: Payer: Self-pay | Admitting: Family Medicine

## 2019-04-27 ENCOUNTER — Other Ambulatory Visit: Payer: Medicare Other

## 2019-04-27 ENCOUNTER — Other Ambulatory Visit: Payer: Self-pay

## 2019-04-27 DIAGNOSIS — E559 Vitamin D deficiency, unspecified: Secondary | ICD-10-CM

## 2019-04-27 DIAGNOSIS — I10 Essential (primary) hypertension: Secondary | ICD-10-CM

## 2019-04-27 DIAGNOSIS — E119 Type 2 diabetes mellitus without complications: Secondary | ICD-10-CM

## 2019-04-27 DIAGNOSIS — M159 Polyosteoarthritis, unspecified: Secondary | ICD-10-CM

## 2019-04-27 DIAGNOSIS — E785 Hyperlipidemia, unspecified: Secondary | ICD-10-CM

## 2019-04-27 DIAGNOSIS — K219 Gastro-esophageal reflux disease without esophagitis: Secondary | ICD-10-CM

## 2019-04-27 DIAGNOSIS — K625 Hemorrhage of anus and rectum: Secondary | ICD-10-CM

## 2019-04-27 DIAGNOSIS — G4737 Central sleep apnea in conditions classified elsewhere: Secondary | ICD-10-CM

## 2019-04-27 DIAGNOSIS — E1169 Type 2 diabetes mellitus with other specified complication: Secondary | ICD-10-CM | POA: Diagnosis not present

## 2019-04-27 DIAGNOSIS — F320 Major depressive disorder, single episode, mild: Secondary | ICD-10-CM

## 2019-04-27 LAB — BAYER DCA HB A1C WAIVED: HB A1C (BAYER DCA - WAIVED): 6.3 % (ref <7.0)

## 2019-04-28 LAB — CBC WITH DIFFERENTIAL/PLATELET
Basophils Absolute: 0.1 10*3/uL (ref 0.0–0.2)
Basos: 1 %
EOS (ABSOLUTE): 0.2 10*3/uL (ref 0.0–0.4)
Eos: 3 %
Hematocrit: 36.3 % (ref 34.0–46.6)
Hemoglobin: 12.7 g/dL (ref 11.1–15.9)
Immature Grans (Abs): 0 10*3/uL (ref 0.0–0.1)
Immature Granulocytes: 0 %
Lymphocytes Absolute: 3.4 10*3/uL — ABNORMAL HIGH (ref 0.7–3.1)
Lymphs: 50 %
MCH: 30 pg (ref 26.6–33.0)
MCHC: 35 g/dL (ref 31.5–35.7)
MCV: 86 fL (ref 79–97)
Monocytes Absolute: 0.4 10*3/uL (ref 0.1–0.9)
Monocytes: 6 %
Neutrophils Absolute: 2.7 10*3/uL (ref 1.4–7.0)
Neutrophils: 40 %
Platelets: 256 10*3/uL (ref 150–450)
RBC: 4.23 x10E6/uL (ref 3.77–5.28)
RDW: 13.7 % (ref 11.7–15.4)
WBC: 6.8 10*3/uL (ref 3.4–10.8)

## 2019-04-28 LAB — LIPID PANEL
Chol/HDL Ratio: 5.3 ratio — ABNORMAL HIGH (ref 0.0–4.4)
Cholesterol, Total: 159 mg/dL (ref 100–199)
HDL: 30 mg/dL — ABNORMAL LOW (ref >39)
LDL Calculated: 69 mg/dL (ref 0–99)
Triglycerides: 302 mg/dL — ABNORMAL HIGH (ref 0–149)
VLDL Cholesterol Cal: 60 mg/dL — ABNORMAL HIGH (ref 5–40)

## 2019-04-28 LAB — BMP8+EGFR
BUN/Creatinine Ratio: 34 — ABNORMAL HIGH (ref 12–28)
BUN: 20 mg/dL (ref 8–27)
CO2: 22 mmol/L (ref 20–29)
Calcium: 9.6 mg/dL (ref 8.7–10.3)
Chloride: 103 mmol/L (ref 96–106)
Creatinine, Ser: 0.59 mg/dL (ref 0.57–1.00)
GFR calc Af Amer: 109 mL/min/{1.73_m2} (ref >59)
GFR calc non Af Amer: 94 mL/min/{1.73_m2} (ref >59)
Glucose: 128 mg/dL — ABNORMAL HIGH (ref 65–99)
Potassium: 4.2 mmol/L (ref 3.5–5.2)
Sodium: 140 mmol/L (ref 134–144)

## 2019-04-28 LAB — HEPATIC FUNCTION PANEL
ALT: 48 IU/L — ABNORMAL HIGH (ref 0–32)
AST: 40 IU/L (ref 0–40)
Albumin: 4.6 g/dL (ref 3.8–4.8)
Alkaline Phosphatase: 49 IU/L (ref 39–117)
Bilirubin Total: <0.2 mg/dL (ref 0.0–1.2)
Bilirubin, Direct: 0.08 mg/dL (ref 0.00–0.40)
Total Protein: 6.5 g/dL (ref 6.0–8.5)

## 2019-04-28 LAB — VITAMIN D 25 HYDROXY (VIT D DEFICIENCY, FRACTURES): Vit D, 25-Hydroxy: 50.7 ng/mL (ref 30.0–100.0)

## 2019-05-01 ENCOUNTER — Other Ambulatory Visit: Payer: Self-pay | Admitting: *Deleted

## 2019-05-01 MED ORDER — OMEGA-3-ACID ETHYL ESTERS 1 G PO CAPS: 2.0000 g | ORAL_CAPSULE | Freq: Two times a day (BID) | ORAL | 3 refills | Status: DC

## 2019-05-29 ENCOUNTER — Ambulatory Visit (INDEPENDENT_AMBULATORY_CARE_PROVIDER_SITE_OTHER): Payer: Medicare Other | Admitting: Urology

## 2019-05-29 DIAGNOSIS — R3915 Urgency of urination: Secondary | ICD-10-CM | POA: Diagnosis not present

## 2019-05-29 DIAGNOSIS — M545 Low back pain: Secondary | ICD-10-CM | POA: Diagnosis not present

## 2019-05-29 DIAGNOSIS — N3941 Urge incontinence: Secondary | ICD-10-CM

## 2019-06-06 ENCOUNTER — Other Ambulatory Visit: Payer: Self-pay | Admitting: Family Medicine

## 2019-09-22 ENCOUNTER — Telehealth: Payer: Medicare Other | Admitting: Physician Assistant

## 2019-09-22 DIAGNOSIS — R300 Dysuria: Secondary | ICD-10-CM

## 2019-09-22 MED ORDER — CEPHALEXIN 500 MG PO CAPS: 500.0000 mg | ORAL_CAPSULE | Freq: Two times a day (BID) | ORAL | 0 refills | Status: AC

## 2019-09-22 NOTE — Progress Notes (Signed)
I have spent 5 minutes in review of e-visit questionnaire, review and updating patient chart, medical decision making and response to patient.   Christyl Osentoski Cody Abygail Galeno, PA-C    

## 2019-09-22 NOTE — Progress Notes (Signed)

## 2019-10-07 ENCOUNTER — Other Ambulatory Visit: Payer: Self-pay | Admitting: Family Medicine

## 2019-10-30 ENCOUNTER — Other Ambulatory Visit: Payer: Self-pay

## 2019-10-30 ENCOUNTER — Encounter: Payer: Self-pay | Admitting: Family Medicine

## 2019-10-30 ENCOUNTER — Ambulatory Visit (INDEPENDENT_AMBULATORY_CARE_PROVIDER_SITE_OTHER): Payer: Medicare Other | Admitting: Family Medicine

## 2019-10-30 VITALS — BP 133/71 | HR 67 | Temp 97.1°F | Ht 60.0 in | Wt 195.8 lb

## 2019-10-30 DIAGNOSIS — E785 Hyperlipidemia, unspecified: Secondary | ICD-10-CM

## 2019-10-30 DIAGNOSIS — Z23 Encounter for immunization: Secondary | ICD-10-CM | POA: Diagnosis not present

## 2019-10-30 DIAGNOSIS — K760 Fatty (change of) liver, not elsewhere classified: Secondary | ICD-10-CM | POA: Diagnosis not present

## 2019-10-30 DIAGNOSIS — F419 Anxiety disorder, unspecified: Secondary | ICD-10-CM

## 2019-10-30 DIAGNOSIS — E1169 Type 2 diabetes mellitus with other specified complication: Secondary | ICD-10-CM

## 2019-10-30 DIAGNOSIS — I10 Essential (primary) hypertension: Secondary | ICD-10-CM | POA: Diagnosis not present

## 2019-10-30 DIAGNOSIS — F329 Major depressive disorder, single episode, unspecified: Secondary | ICD-10-CM

## 2019-10-30 DIAGNOSIS — E119 Type 2 diabetes mellitus without complications: Secondary | ICD-10-CM

## 2019-10-30 DIAGNOSIS — E1159 Type 2 diabetes mellitus with other circulatory complications: Secondary | ICD-10-CM

## 2019-10-30 DIAGNOSIS — Z789 Other specified health status: Secondary | ICD-10-CM

## 2019-10-30 LAB — BAYER DCA HB A1C WAIVED: HB A1C (BAYER DCA - WAIVED): 5.6 % (ref <7.0)

## 2019-10-30 MED ORDER — FENOFIBRATE 160 MG PO TABS: 160.0000 mg | ORAL_TABLET | Freq: Every day | ORAL | 2 refills | Status: DC

## 2019-10-30 MED ORDER — AMLODIPINE BESYLATE 5 MG PO TABS: 5.0000 mg | ORAL_TABLET | Freq: Every day | ORAL | 2 refills | Status: DC

## 2019-10-30 NOTE — Progress Notes (Signed)
Subjective: CC: establish care, DM2, HTN, HLD PCP: Janora Norlander, DO QQI:[id number] F Grand is a 68 y.o. female presenting to clinic today for:  1. Type 2 Diabetes with hypertension, hyperlipidemia and fatty of her disease:  History: Onset 5 years ago.  Previous intolerance to multiple statins. No hospitalizations for DM Patient reports typical fasting glucose is around 110-120.  Denies any hypoglycemic episodes.  No blood sugars above 150. Taking medication(s): Metformin 1000 mg twice daily, fenofibrate daily, losartan 100 mg daily.  She is not taking Lovaza 2 g twice daily.  Last eye exam: Needs, sees my eye doctor. Last foot exam: UTD Last A1c:  Lab Results  Component Value Date   HGBA1C 6.3 04/27/2019   Nephropathy screen indicated?: on ARB Last flu, zoster and/or pneumovax: PNA and Flu Immunization History  Administered Date(s) Administered  . Influenza, High Dose Seasonal PF 10/06/2018  . Influenza,inj,Quad PF,6+ Mos 09/28/2013, 09/27/2014, 10/15/2015, 11/03/2016  . Pneumococcal Conjugate-13 05/25/2018  . Tdap 09/27/2014  . Zoster 01/31/2013  . Zoster Recombinat (Shingrix) 02/07/2019    ROS: Denies dizziness, LOC, polyuria, polydipsia, unintended weight loss/gain, foot ulcerations, numbness or tingling in extremities, shortness of breath or chest pain.  2.  Depression/anxiety History: Longstanding history of anxiety and depression.  Patient has seen a counselor in the past but has no intention of seeing 1 again.  Previously failed Effexor.  Patient is treated with Lexapro 20 mg daily, Abilify 5 mg daily and amitriptyline 10 mg daily by previous PCP.  She notes good sleep.  She reports mood stability.  Denies any increased anxiety, depression, heart palpitations, racing heart, loss of consciousness or dizziness.  Denies any difficulty interacting with family members.  She cares for her 2 grandchildren 5 days/week and notes that they "energize her".  Overall she feels that  she is doing well and has no concerns.    ROS: Per HPI  Allergies  Allergen Reactions  . Statins Other (See Comments)    myalgias  . E-Mycin [Erythromycin] Other (See Comments)    Medication intolerance stomach cramps  . Estrogens Other (See Comments)    Estrogen replacement Therapy intolerance mood changes  . Sulfa Antibiotics Other (See Comments)    Causes severe stomach cramps   . Vioxx [Rofecoxib] Other (See Comments)    Unknown reaction/interaction. Possible stomach cramps   Past Medical History:  Diagnosis Date  . Adrenal adenoma    left side   . Allergic rhinitis   . 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 Anxiety and depression   . Asthmatic bronchitis   . Bilateral carpal tunnel syndrome   . Condyloma acuminata   . Diabetes mellitus without complication ([place])   . Diverticulosis   . Eczema    STABLE   . GERD (gastroesophageal reflux disease)   . H. pylori infection 11/16/1993  . Hiatal hernia   . History of benign adrenal tumor 2007  . History of benign adrenal tumor SEC   [facility]   . History of pneumonia 2007, 2008  . Hyperlipidemia   . Hypertension   . IBS (irritable bowel syndrome)   . Internal hemorrhoids   . Kyphosis   . Memory loss   . Metabolic syndrome   . Otitis media, serous, acute   . Sleep apnea    uses CPAP  . Symptomatic menopausal or female climacteric states   . Vitamin D deficiency     Current Outpatient Medications:  .  amitriptyline (ELAVIL) 10 MG tablet, Take 1 tablet (  10 mg total) by mouth at bedtime., Disp: 90 tablet, Rfl: 2 .  amLODipine (NORVASC) 5 MG tablet, Take 1 tablet (5 mg total) by mouth daily., Disp: 90 tablet, Rfl: 2 .  ARIPiprazole (ABILIFY) 5 MG tablet, Take 1 tablet (5 mg total) by mouth daily., Disp: 90 tablet, Rfl: 3 .  Ascorbic Acid (VITAMIN C) 1000 MG tablet, Take 1,000 mg by mouth daily., Disp: , Rfl:  .  aspirin EC 81 MG tablet, Take 1 tablet (81 mg total) by mouth daily., Disp: , Rfl:  .  Calcium Carb-Cholecalciferol (CALCIUM  600+D3) 600-800 MG-UNIT TABS, Take 2 tablets by mouth daily., Disp: , Rfl:  .  Cholecalciferol (VITAMIN D-3) 5000 UNITS TABS, Take 5,000 Units by mouth daily. , Disp: , Rfl:  .  escitalopram (LEXAPRO) 20 MG tablet, Take 1 tablet (20 mg total) by mouth daily., Disp: 30 tablet, Rfl: 0 .  fenofibrate 160 MG tablet, Take 1 tablet (160 mg total) by mouth daily., Disp: 90 tablet, Rfl: 2 .  fluticasone (FLONASE) 50 MCG/ACT nasal spray, Use 2 sprays in each  nostril daily (Patient taking differently: Use 2 sprays in each  nostril daily as needed for congestion), Disp: 48 g, Rfl: 2 .  glucose blood (ONE TOUCH ULTRA TEST) test strip, TEST BLOOD SUGAR 3 TIMES A DAY, Disp: 300 each, Rfl: 0 .  hydrochlorothiazide (HYDRODIURIL) 25 MG tablet, TAKE 1 TABLET DAILY, Disp: 30 tablet, Rfl: 4 .  losartan (COZAAR) 100 MG tablet, TAKE 1 TABLET DAILY, Disp: 30 tablet, Rfl: 4 .  metFORMIN (GLUCOPHAGE) 1000 MG tablet, Take 1 tablet (1,000 mg total) by mouth 2 (two) times daily with a meal., Disp: 180 tablet, Rfl: 2 .  montelukast (SINGULAIR) 10 MG tablet, Take 1 tablet (10 mg total) by mouth daily., Disp: 90 tablet, Rfl: 2 .  mupirocin ointment (BACTROBAN) 2 %, Apply 1 application topically 2 (two) times daily., Disp: 22 g, Rfl: 3 .  omega-3 acid ethyl esters (LOVAZA) 1 g capsule, Take 2 capsules (2 g total) by mouth 2 (two) times daily., Disp: 360 capsule, Rfl: 3 .  omeprazole (PRILOSEC) 40 MG capsule, TAKE 1 CAPSULE BY MOUTH  DAILY, Disp: 90 capsule, Rfl: 3 .  ONETOUCH VERIO test strip, TEST 3 TIMES A DAY, Disp: 300 each, Rfl: 3 .  PROAIR HFA 108 (90 BASE) MCG/ACT inhaler, Use 2 puffs into the lungs  every 6 hours as needed for wheezing or shortness of  breath (Patient taking differently: Use 2 puffs into the lungs  every 6 hours as needed for wheezing or shortness of  breath (spring time ONLY)), Disp: 25.5 g, Rfl: 2 .  SYMBICORT 160-4.5 MCG/ACT inhaler, Use 2 puffs two times daily (Patient taking differently: Use 2 puffs  two times daily as needed (springtime ONLY)), Disp: 30.6 g, Rfl: 2 Social History   Socioeconomic History  . Marital status: Married    Spouse name: Not on file  . Number of children: 2  . Years of education: Not on file  . Highest education level: Some college, no degree  Occupational History  . Occupation: housewife  Social Needs  . Financial resource strain: Not hard at all  . Food insecurity    Worry: Never true    Inability: Never true  . Transportation needs    Medical: No    Non-medical: No  Tobacco Use  . Smoking status: Never Smoker  . Smokeless tobacco: Never Used  Substance and Sexual Activity  . Alcohol use: No  .  Drug use: No  . Sexual activity: Not on file  Lifestyle  . Physical activity    Days per week: 7 days    Minutes per session: 30 min  . Stress: Only a little  Relationships  . Social connections    Talks on phone: More than three times a week    Gets together: More than three times a week    Attends religious service: Never    Active member of club or organization: No    Attends meetings of clubs or organizations: Never    Relationship status: Married  . Intimate partner violence    Fear of current or ex partner: No    Emotionally abused: No    Physically abused: No    Forced sexual activity: No  Other Topics Concern  . Not on file  Social History Narrative  . Not on file   Family History  Problem Relation Age of Onset  . Parkinsonism Father   . Emphysema Father   . Hip fracture Father   . Heart disease Mother   . COPD Brother   . Hypertension Brother   . Heart disease Brother   . Heart failure Sister   . Ovarian cancer Sister   . COPD Sister   . Lymphoma Sister   . COPD Sister   . COPD Brother     Objective: Office vital signs reviewed. BP 133/71   Pulse 67   Temp (!) 97.1 F (36.2 C) (Temporal)   Ht 5' (1.524 m)   Wt 195 lb 12.8 oz (88.8 kg)   SpO2 98%   BMI 38.24 kg/m   Physical Examination:  General: Awake,  alert, No acute distress HEENT: Normal.  Sclera white.  Mucous membranes.   Cardio: regular rate and rhythm, S1S2 heard.  Soft systolic murmur noted at the left sternal border. Pulm: clear to auscultation bilaterally, no wheezes, rhonchi or rales; normal work of breathing on room air Extremities: warm, well perfused, No edema, cyanosis or clubbing; +2 pulses bilaterally MSK: slow gait and hunched station Skin: dry; intact; no rashes or lesions Psych: Mood stable, speech normal, affect appropriate.  Somewhat hard of hearing. Depression screen [facility] 2/9 10/30/2019 02/07/2019 12/11/2018  Decreased Interest 0 0 0  Down, Depressed, Hopeless 0 0 0  PHQ - 2 Score 0 0 0  Altered sleeping 0 - -  Tired, decreased energy 0 - -  Change in appetite 0 - -  Feeling bad or failure about yourself  0 - -  Trouble concentrating 0 - -  Moving slowly or fidgety/restless 0 - -  Suicidal thoughts 0 - -  PHQ-9 Score 0 - -   GAD 7 : Generalized Anxiety Score 10/30/2019  Nervous, Anxious, on Edge 0  Control/stop worrying 1  Worry too much - different things 2  Trouble relaxing 0  Restless 0  Easily annoyed or irritable 0  Afraid - awful might happen 0  Total GAD 7 Score 3  Anxiety Difficulty Not difficult at all   Assessment/ Plan: 68 y.o. female   1. Type 2 diabetes mellitus without complication, without long-term current use of insulin (HCC) Controlled.  Continue current regimen - Bayer DCA Hb A1c Waived  2. Hypertension associated with diabetes ([place]) Controlled.  Continue current regimen - CMP14+EGFR  3. Hyperlipidemia associated with type 2 diabetes mellitus ([place]) Not taking the Lovaza.  She is on fenofibrate only.  Check fasting lipid panel.  May need to consider adding the lavage of back.  She has statin intolerance and known fatty liver disease - CMP14+EGFR - Lipid Panel - TSH  4. Hepatic steatosis - CMP14+EGFR  5. Statin intolerance  6. Anxiety and depression Per her report  controlled.  Will check TSH.  Continue current regimen. - TSH   No orders of the defined types were placed in this encounter.  No orders of the defined types were placed in this encounter.    Janora Norlander, DO [place] [phone]

## 2019-10-30 NOTE — Addendum Note (Signed)
Addended byCarrolyn Leigh on: 10/30/2019 05:06 PM   Modules accepted: Orders

## 2019-10-30 NOTE — Patient Instructions (Addendum)
Sugar looks good.  Get your eye exam scheduled.  You had labs performed today.  You will be contacted with the results of the labs once they are available, usually in the next 3 business days for routine lab work.  If you have an active my chart account, they will be released to your MyChart.  If you prefer to have these labs released to you via telephone, please let us know.  If you had a pap smear or biopsy performed, expect to be contacted in about 7-10 days.

## 2019-10-31 LAB — CMP14+EGFR
ALT: 27 IU/L (ref 0–32)
AST: 23 IU/L (ref 0–40)
Albumin/Globulin Ratio: 2 (ref 1.2–2.2)
Albumin: 4.6 g/dL (ref 3.8–4.8)
Alkaline Phosphatase: 45 IU/L (ref 39–117)
BUN/Creatinine Ratio: 31 — ABNORMAL HIGH (ref 12–28)
BUN: 20 mg/dL (ref 8–27)
Bilirubin Total: 0.4 mg/dL (ref 0.0–1.2)
CO2: 21 mmol/L (ref 20–29)
Calcium: 9.7 mg/dL (ref 8.7–10.3)
Chloride: 101 mmol/L (ref 96–106)
Creatinine, Ser: 0.65 mg/dL (ref 0.57–1.00)
GFR calc Af Amer: 105 mL/min/{1.73_m2} (ref >59)
GFR calc non Af Amer: 91 mL/min/{1.73_m2} (ref >59)
Globulin, Total: 2.3 g/dL (ref 1.5–4.5)
Glucose: 102 mg/dL — ABNORMAL HIGH (ref 65–99)
Potassium: 3.9 mmol/L (ref 3.5–5.2)
Sodium: 142 mmol/L (ref 134–144)
Total Protein: 6.9 g/dL (ref 6.0–8.5)

## 2019-10-31 LAB — LIPID PANEL
Chol/HDL Ratio: 4.7 ratio — ABNORMAL HIGH (ref 0.0–4.4)
Cholesterol, Total: 160 mg/dL (ref 100–199)
HDL: 34 mg/dL — ABNORMAL LOW (ref >39)
LDL Chol Calc (NIH): 93 mg/dL (ref 0–99)
Triglycerides: 194 mg/dL — ABNORMAL HIGH (ref 0–149)
VLDL Cholesterol Cal: 33 mg/dL (ref 5–40)

## 2019-10-31 LAB — TSH: TSH: 2.34 u[IU]/mL (ref 0.450–4.500)

## 2019-11-01 ENCOUNTER — Other Ambulatory Visit: Payer: Self-pay | Admitting: Family Medicine

## 2019-11-09 ENCOUNTER — Other Ambulatory Visit: Payer: Self-pay | Admitting: Family Medicine

## 2019-12-15 ENCOUNTER — Encounter: Payer: Self-pay | Admitting: Family Medicine

## 2019-12-15 ENCOUNTER — Other Ambulatory Visit: Payer: Self-pay | Admitting: Family Medicine

## 2020-01-09 DIAGNOSIS — H5203 Hypermetropia, bilateral: Secondary | ICD-10-CM | POA: Diagnosis not present

## 2020-01-09 DIAGNOSIS — H25813 Combined forms of age-related cataract, bilateral: Secondary | ICD-10-CM | POA: Diagnosis not present

## 2020-01-09 DIAGNOSIS — H524 Presbyopia: Secondary | ICD-10-CM | POA: Diagnosis not present

## 2020-01-09 DIAGNOSIS — E119 Type 2 diabetes mellitus without complications: Secondary | ICD-10-CM | POA: Diagnosis not present

## 2020-01-09 DIAGNOSIS — H52223 Regular astigmatism, bilateral: Secondary | ICD-10-CM | POA: Diagnosis not present

## 2020-01-09 LAB — HM DIABETES EYE EXAM

## 2020-01-14 ENCOUNTER — Other Ambulatory Visit: Payer: Self-pay | Admitting: Family Medicine

## 2020-01-15 ENCOUNTER — Other Ambulatory Visit: Payer: Self-pay | Admitting: *Deleted

## 2020-01-15 MED ORDER — ARIPIPRAZOLE 5 MG PO TABS: 5.0000 mg | ORAL_TABLET | Freq: Every day | ORAL | 0 refills | Status: DC

## 2020-01-15 MED ORDER — ESCITALOPRAM OXALATE 20 MG PO TABS: 20.0000 mg | ORAL_TABLET | Freq: Every day | ORAL | 0 refills | Status: DC

## 2020-02-01 ENCOUNTER — Other Ambulatory Visit: Payer: Self-pay

## 2020-02-01 ENCOUNTER — Ambulatory Visit (INDEPENDENT_AMBULATORY_CARE_PROVIDER_SITE_OTHER): Payer: Medicare Other | Admitting: Family Medicine

## 2020-02-01 DIAGNOSIS — E1169 Type 2 diabetes mellitus with other specified complication: Secondary | ICD-10-CM

## 2020-02-01 DIAGNOSIS — E119 Type 2 diabetes mellitus without complications: Secondary | ICD-10-CM

## 2020-02-01 DIAGNOSIS — I10 Essential (primary) hypertension: Secondary | ICD-10-CM

## 2020-02-01 DIAGNOSIS — E1159 Type 2 diabetes mellitus with other circulatory complications: Secondary | ICD-10-CM | POA: Diagnosis not present

## 2020-02-01 DIAGNOSIS — F419 Anxiety disorder, unspecified: Secondary | ICD-10-CM

## 2020-02-01 DIAGNOSIS — E785 Hyperlipidemia, unspecified: Secondary | ICD-10-CM | POA: Diagnosis not present

## 2020-02-01 DIAGNOSIS — F329 Major depressive disorder, single episode, unspecified: Secondary | ICD-10-CM

## 2020-02-01 NOTE — Progress Notes (Signed)
Telephone visit  Subjective: CC: F/u DM PCP: Janora Norlander, DO [id number] Melissa Bowman is a 69 y.o. female calls for telephone consult today. Patient provides verbal consent for consult held via phone.  Due to COVID-19 pandemic this visit was conducted virtually. This visit type was conducted due to national recommendations for restrictions regarding the COVID-19 Pandemic (e.g. social distancing, sheltering in place) in an effort to limit this patient's exposure and mitigate transmission in our community. All issues noted in this document were discussed and addressed.  A physical exam was not performed with this format.   Location of patient: home Location of provider: Working remotely from home Others present for call: none  1. Type 2 Diabetes w/ HTN, HLD  Patient reports good control of blood sugars.  She reports compliance with her amlodipine, Hyzaar, Metformin and fenofibrate.  She is unsure if she is taking lovaza.  She has no concerns or complaints today.  Last eye exam: UTD Last foot exam: needs Last A1c:  Lab Results  Component Value Date   HGBA1C 5.6 10/30/2019   Nephropathy screen indicated?: on ARB Last flu, zoster and/or pneumovax:  Immunization History  Administered Date(s) Administered  . Fluad Quad(high Dose 65+) 10/30/2019  . Influenza, High Dose Seasonal PF 10/06/2018  . Influenza,inj,Quad PF,6+ Mos 09/28/2013, 09/27/2014, 10/15/2015, 11/03/2016  . Pneumococcal Conjugate-13 05/25/2018  . Pneumococcal Polysaccharide-23 10/30/2019  . Tdap 09/27/2014  . Zoster 01/31/2013  . Zoster Recombinat (Shingrix) 02/07/2019    ROS: Denies dizziness, LOC, polyuria, polydipsia, unintended weight loss/gain, foot ulcerations, numbness or tingling in extremities, shortness of breath or chest pain.  2.  Anxiety and depression Patient reports compliance with Abilify, Lexapro, amitriptyline.  She has no concerns today.  She reports symptoms are stable.  ROS: Per  HPI  Allergies  Allergen Reactions  . Statins Other (See Comments)    myalgias  . E-Mycin [Erythromycin] Other (See Comments)    Medication intolerance stomach cramps  . Estrogens Other (See Comments)    Estrogen replacement Therapy intolerance mood changes  . Sulfa Antibiotics Other (See Comments)    Causes severe stomach cramps   . Vioxx [Rofecoxib] Other (See Comments)    Unknown reaction/interaction. Possible stomach cramps   Past Medical History:  Diagnosis Date  . Adrenal adenoma    left side   . Allergic rhinitis   . 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 Anxiety and depression   . Asthmatic bronchitis   . Bilateral carpal tunnel syndrome   . Condyloma acuminata   . Diabetes mellitus without complication ([place])   . Diverticulosis   . Eczema    STABLE   . GERD (gastroesophageal reflux disease)   . H. pylori infection 11/16/1993  . Hiatal hernia   . History of benign adrenal tumor 2007  . History of benign adrenal tumor [facility]   [facility]   . History of pneumonia 2007, 2008  . Hyperlipidemia   . Hypertension   . IBS (irritable bowel syndrome)   . Internal hemorrhoids   . Kyphosis   . Memory loss   . Metabolic syndrome   . Otitis media, serous, acute   . Sleep apnea    uses CPAP  . Symptomatic menopausal or female climacteric states   . Vitamin D deficiency     Current Outpatient Medications:  .  amitriptyline (ELAVIL) 10 MG tablet, TAKE 1 TABLET BY MOUTH AT  BEDTIME, Disp: 90 tablet, Rfl: 0 .  amLODipine (NORVASC) 5 MG tablet, Take 1 tablet (5  mg total) by mouth daily., Disp: 90 tablet, Rfl: 2 .  ARIPiprazole (ABILIFY) 5 MG tablet, Take 1 tablet (5 mg total) by mouth daily., Disp: 90 tablet, Rfl: 0 .  Ascorbic Acid (VITAMIN C) 1000 MG tablet, Take 1,000 mg by mouth daily., Disp: , Rfl:  .  aspirin EC 81 MG tablet, Take 1 tablet (81 mg total) by mouth daily., Disp: , Rfl:  .  Calcium Carb-Cholecalciferol (CALCIUM 600+D3) 600-800 MG-UNIT TABS, Take 2 tablets by mouth daily., Disp:  , Rfl:  .  Cholecalciferol (VITAMIN D-3) 5000 UNITS TABS, Take 5,000 Units by mouth daily. , Disp: , Rfl:  .  escitalopram (LEXAPRO) 20 MG tablet, Take 1 tablet (20 mg total) by mouth daily., Disp: 90 tablet, Rfl: 0 .  fenofibrate 160 MG tablet, Take 1 tablet (160 mg total) by mouth daily., Disp: 90 tablet, Rfl: 2 .  fluticasone (FLONASE) 50 MCG/ACT nasal spray, Use 2 sprays in each  nostril daily (Patient taking differently: Use 2 sprays in each  nostril daily as needed for congestion), Disp: 48 g, Rfl: 2 .  glucose blood (ONE TOUCH ULTRA TEST) test strip, TEST BLOOD SUGAR 3 TIMES A DAY, Disp: 300 each, Rfl: 0 .  losartan-hydrochlorothiazide (HYZAAR) 100-25 MG tablet, TAKE 1 TABLET BY MOUTH  EVERY DAY AS DIRECTED, Disp: 90 tablet, Rfl: 3 .  metFORMIN (GLUCOPHAGE) 1000 MG tablet, TAKE 1 TABLET BY MOUTH  TWICE DAILY WITH A MEAL, Disp: 180 tablet, Rfl: 3 .  montelukast (SINGULAIR) 10 MG tablet, TAKE 1 TABLET BY MOUTH  DAILY, Disp: 90 tablet, Rfl: 3 .  mupirocin ointment (BACTROBAN) 2 %, Apply 1 application topically 2 (two) times daily., Disp: 22 g, Rfl: 3 .  omega-3 acid ethyl esters (LOVAZA) 1 g capsule, Take 2 capsules (2 g total) by mouth 2 (two) times daily., Disp: 360 capsule, Rfl: 3 .  omeprazole (PRILOSEC) 40 MG capsule, TAKE 1 CAPSULE BY MOUTH  DAILY, Disp: 90 capsule, Rfl: 3 .  ONETOUCH VERIO test strip, TEST 3 TIMES A DAY, Disp: 300 each, Rfl: 3 .  PROAIR HFA 108 (90 BASE) MCG/ACT inhaler, Use 2 puffs into the lungs  every 6 hours as needed for wheezing or shortness of  breath (Patient taking differently: Use 2 puffs into the lungs  every 6 hours as needed for wheezing or shortness of  breath (spring time ONLY)), Disp: 25.5 g, Rfl: 2 .  SYMBICORT 160-4.5 MCG/ACT inhaler, Use 2 puffs two times daily (Patient taking differently: Use 2 puffs two times daily as needed (springtime ONLY)), Disp: 30.6 g, Rfl: 2  Assessment/ Plan: 69 y.o. female   1. Type 2 diabetes mellitus without complication,  without long-term current use of insulin (HCC) Stable, controlled.  Needs foot exam at next visit.  2. Hyperlipidemia associated with type 2 diabetes mellitus ([place]) Still unsure if taking Lovaza.  Intolerant to statins due to pain  3. Hypertension associated with diabetes ([place]) Controlled.  4. Anxiety and depression Stable.   Start time: 9:40am End time: 9:45am  Total time spent on patient care (including telephone call/ virtual visit): 14 minutes  [place], [place] [phone]

## 2020-03-04 ENCOUNTER — Ambulatory Visit (INDEPENDENT_AMBULATORY_CARE_PROVIDER_SITE_OTHER): Payer: Medicare Other | Admitting: *Deleted

## 2020-03-04 DIAGNOSIS — Z0000 Encounter for general adult medical examination without abnormal findings: Secondary | ICD-10-CM | POA: Diagnosis not present

## 2020-03-04 NOTE — Progress Notes (Addendum)
MEDICARE ANNUAL WELLNESS VISIT  03/04/2020  Telephone Visit Disclaimer This Medicare AWV was conducted by telephone due to national recommendations for restrictions regarding the COVID-19 Pandemic (e.g. social distancing).  I verified, using two identifiers, that I am speaking with [name] or their authorized healthcare agent. I discussed the limitations, risks, security, and privacy concerns of performing an evaluation and management service by telephone and the potential availability of an in-person appointment in the future. The patient expressed understanding and agreed to proceed.   Subjective:  MARQUITE TORMA is a 69 y.o. female patient of [name], DO who had a Medicare Annual Wellness Visit today via telephone. Kristianne is retired and lives with her husband [name]. She has 2 daughters that live locally.  She reports that she is socially active and does interact with friends/family regularly. She is minimally physically active and enjoys reading and puzzles.  Patient Care Team: [name], DO as PCP - General (Family Medicine) Jene Every, MD as Consulting Physician (Orthopedic Surgery) Laqueta Linden, MD as Attending Physician (Cardiology)  Advanced Directives 03/04/2020 02/07/2019 01/19/2018 01/12/2018  Does Patient Have a Medical Advance Directive? No No No No  Would patient like information on creating a medical advance directive? No - Patient declined Yes (MAU/Ambulatory/Procedural Areas - Information given) No - Patient declined No - Patient declined    Hospital Utilization Over the Past 12 Months: # of hospitalizations or ER visits: 0 # of surgeries: 0  Review of Systems    Patient reports that her overall health is unchanged compared to last year.  History obtained from the patient.  Patient Reported Readings (BP, Pulse, CBG, Weight, etc) none  Pain Assessment Pain : No/denies pain     Current Medications & Allergies  (verified) Allergies as of 03/04/2020       Reactions   Statins Other (See Comments)   myalgias   E-mycin [erythromycin] Other (See Comments)   Medication intolerance stomach cramps   Estrogens Other (See Comments)   Estrogen replacement Therapy intolerance mood changes   Sulfa Antibiotics Other (See Comments)   Causes severe stomach cramps    Vioxx [rofecoxib] Other (See Comments)   Unknown reaction/interaction. Possible stomach cramps        Medication List        Accurate as of March 04, 2020  8:45 AM. If you have any questions, ask your nurse or doctor.          STOP taking these medications    glucose blood test strip Commonly known as: ONE TOUCH ULTRA TEST   omega-3 acid ethyl esters 1 g capsule Commonly known as: LOVAZA   OneTouch Verio test strip Generic drug: glucose blood       TAKE these medications    amitriptyline 10 MG tablet Commonly known as: ELAVIL TAKE 1 TABLET BY MOUTH AT  BEDTIME   amLODipine 5 MG tablet Commonly known as: NORVASC Take 1 tablet (5 mg total) by mouth daily.   ARIPiprazole 5 MG tablet Commonly known as: Abilify Take 1 tablet (5 mg total) by mouth daily.   aspirin EC 81 MG tablet Take 1 tablet (81 mg total) by mouth daily.   Calcium 600+D3 600-800 MG-UNIT Tabs Generic drug: Calcium Carb-Cholecalciferol Take 2 tablets by mouth daily.   escitalopram 20 MG tablet Commonly known as: Lexapro Take 1 tablet (20 mg total) by mouth daily.   fenofibrate 160 MG tablet Take 1 tablet (160 mg total) by mouth  daily.   fluticasone 50 MCG/ACT nasal spray Commonly known as: FLONASE Use 2 sprays in each  nostril daily What changed:  how much to take how to take this when to take this   losartan-hydrochlorothiazide 100-25 MG tablet Commonly known as: HYZAAR TAKE 1 TABLET BY MOUTH  EVERY DAY AS DIRECTED   metFORMIN 1000 MG tablet Commonly known as: GLUCOPHAGE TAKE 1 TABLET BY MOUTH  TWICE DAILY WITH A MEAL   montelukast  10 MG tablet Commonly known as: SINGULAIR TAKE 1 TABLET BY MOUTH  DAILY   mupirocin ointment 2 % Commonly known as: Bactroban Apply 1 application topically 2 (two) times daily.   omeprazole 40 MG capsule Commonly known as: PRILOSEC TAKE 1 CAPSULE BY MOUTH  DAILY   ProAir HFA 108 (90 Base) MCG/ACT inhaler Generic drug: albuterol Use 2 puffs into the lungs  every 6 hours as needed for wheezing or shortness of  breath What changed: See the new instructions.   Symbicort 160-4.5 MCG/ACT inhaler Generic drug: budesonide-formoterol Use 2 puffs two times daily What changed: See the new instructions.   vitamin C 1000 MG tablet Take 1,000 mg by mouth daily.   Vitamin D-3 125 MCG (5000 UT) Tabs Take 5,000 Units by mouth daily.        History (reviewed): Past Medical History:  Diagnosis Date   Adrenal adenoma    left side    Allergic rhinitis    Allergy to ertapenem    Anxiety and depression    Asthmatic bronchitis    Bilateral carpal tunnel syndrome    Condyloma acuminata    Diabetes mellitus without complication (HCC)    Diverticulosis    Eczema    STABLE    GERD (gastroesophageal reflux disease)    H. pylori infection 11/16/1993   Hiatal hernia    History of benign adrenal tumor 2007   History of benign adrenal tumor [facility]   [facility]    History of pneumonia 2007, 2008   Hyperlipidemia    Hypertension    IBS (irritable bowel syndrome)    Internal hemorrhoids    Kyphosis    Memory loss    Metabolic syndrome    Otitis media, serous, acute    Sleep apnea    uses CPAP   Symptomatic menopausal or female climacteric states    Vitamin D deficiency    Past Surgical History:  Procedure Laterality Date   abdominal wall lematoma of fabdd hys.     APPENDECTOMY     CARPAL TUNNEL RELEASE     CESAREAN SECTION     X2   INCISIONAL HERNIA REPAIR     right breast biopsy  10/96   ROTATOR CUFF REPAIR  2002   right   ROTATOR CUFF REPAIR/ bone spur  08/30/09   SHOULDER  ARTHROSCOPY WITH ROTATOR CUFF REPAIR AND SUBACROMIAL DECOMPRESSION Right 01/19/2018   Procedure: REVISION RIGHT MINI OPEN ROTATOR CUFF REPAIR AND SUBACROMIAL DECOMPRESSION;  Surgeon: Jene Every, MD;  Location: [facility];  Service: Orthopedics;  Laterality: Right;  90 MINS   sleep apnea surgery  5/96   torn cartiledge rt knee x2  05/13/06   Dr. Sherlean Foot Smoc    TOTAL ABDOMINAL HYSTERECTOMY W/ BILATERAL SALPINGOOPHORECTOMY  8/94   fibroids    Family History  Problem Relation Age of Onset   Parkinsonism Father    Emphysema Father    Hip fracture Father    Heart disease Mother    COPD Brother    Hypertension Brother  Heart disease Brother    Heart failure Sister    Ovarian cancer Sister    COPD Sister    Lymphoma Sister    COPD Sister    COPD Brother    Social History   Socioeconomic History   Marital status: Married    Spouse name: [name]   Number of children: 2   Years of education: some college   Highest education level: Some college, no degree  Occupational History   Occupation: retired    Comment: daycare  Tobacco Use   Smoking status: Never Smoker   Smokeless tobacco: Never Used  Substance and Sexual Activity   Alcohol use: No   Drug use: No   Sexual activity: Not on file  Other Topics Concern   Not on file  Social History Narrative   Not on file   Social Determinants of Health   Financial Resource Strain:    Difficulty of Paying Living Expenses:   Food Insecurity:    Worried About Programme researcher, broadcasting/film/video in the Last Year:    Barista in the Last Year:   Transportation Needs:    Freight forwarder (Medical):    Lack of Transportation (Non-Medical):   Physical Activity:    Days of Exercise per Week:    Minutes of Exercise per Session:   Stress:    Feeling of Stress :   Social Connections:    Frequency of Communication with Friends and Family:    Frequency of Social Gatherings with Friends and Family:    Attends Religious Services:    Active Member  of Clubs or Organizations:    Attends Banker Meetings:    Marital Status:     Activities of Daily Living In your present state of health, do you have any difficulty performing the following activities: 03/04/2020  Hearing? Y  Comment has hearing aides but cannot wear because they itch her ears  Vision? N  Difficulty concentrating or making decisions? N  Walking or climbing stairs? N  Dressing or bathing? N  Doing errands, shopping? N  Preparing Food and eating ? N  Using the Toilet? N  In the past six months, have you accidently leaked urine? N  Do you have problems with loss of bowel control? N  Managing your Medications? N  Managing your Finances? N  Housekeeping or managing your Housekeeping? N  Some recent data might be hidden    Patient Education/ Literacy How often do you need to have someone help you when you read instructions, pamphlets, or other written materials from your doctor or pharmacy?: 1 - Never What is the last grade level you completed in school?: some college  Exercise Current Exercise Habits: Home exercise routine, Type of exercise: walking, Time (Minutes): 20, Frequency (Times/Week): 4, Weekly Exercise (Minutes/Week): 80, Intensity: Mild, Exercise limited by: None identified  Diet Patient reports consuming 2 meals a day and 1 snack(s) a day Patient reports that her primary diet is: Regular Patient reports that she does have regular access to food.   Depression Screen PHQ 2/9 Scores 03/04/2020 10/30/2019 02/07/2019 12/11/2018 11/24/2018 05/25/2018 11/24/2017  PHQ - 2 Score 0 0 0 0 1 0 0  PHQ- 9 Score - 0 - - - - -     Fall Risk Fall Risk  03/04/2020 10/30/2019 02/07/2019 12/11/2018 11/24/2018  Falls in the past year? 0 0 1 1 0  Number falls in past yr: - - 0 0 -  Injury with  Fall? - - 1 1 -  Comment - - - shoulder pain -  Risk for fall due to : - - Impaired balance/gait - -  Follow up - - Falls prevention discussed;Education provided - -      Objective:  [name] seemed alert and oriented and she participated appropriately during our telephone visit.  Blood Pressure Weight BMI  BP Readings from Last 3 Encounters:  10/30/19 133/71  02/07/19 (!) 147/81  12/11/18 (!) 151/81   Wt Readings from Last 3 Encounters:  10/30/19 195 lb 12.8 oz (88.8 kg)  02/07/19 201 lb (91.2 kg)  12/11/18 198 lb (89.8 kg)   BMI Readings from Last 1 Encounters:  10/30/19 38.24 kg/m    *Unable to obtain current vital signs, weight, and BMI due to telephone visit type  Hearing/Vision  Oren did seem to have difficulty with hearing during the telephone conversation Reports that she has had a formal eye exam by an eye care professional within the past year Reports that she has not had a formal hearing evaluation within the past year *Unable to fully assess hearing and vision during telephone visit type  Cognitive Function: 6CIT Screen 03/04/2020  What Year? 0 points  What month? 0 points  What time? 0 points  Count back from 20 0 points  Months in reverse 0 points  Repeat phrase 0 points  Total Score 0   (Normal:0-7, Significant for Dysfunction: >8)  Normal Cognitive Function Screening: Yes   Immunization & Health Maintenance Record Immunization History  Administered Date(s) Administered   Fluad Quad(high Dose 65+) 10/30/2019   Influenza, High Dose Seasonal PF 10/06/2018   Influenza,inj,Quad PF,6+ Mos 09/28/2013, 09/27/2014, 10/15/2015, 11/03/2016   Moderna SARS-COVID-2 Vaccination 02/26/2020   Pneumococcal Conjugate-13 05/25/2018   Pneumococcal Polysaccharide-23 10/30/2019   Tdap 09/27/2014   Zoster 01/31/2013   Zoster Recombinat (Shingrix) 02/07/2019    Health Maintenance  Topic Date Due   FOOT EXAM  11/25/2019   Hepatitis C Screening  03/28/2024 (Originally 01/29/1951)   COLON CANCER SCREENING ANNUAL FOBT  03/25/2020   HEMOGLOBIN A1C  04/28/2020   MAMMOGRAM  06/05/2020   OPHTHALMOLOGY EXAM  01/08/2021    COLONOSCOPY  09/14/2021   TETANUS/TDAP  09/27/2024   INFLUENZA VACCINE  Completed   DEXA SCAN  Completed   PNA vac Low Risk Adult  Completed       Assessment  This is a routine wellness examination for LATEFA HAYHURST.  Health Maintenance: Due or Overdue Health Maintenance Due  Topic Date Due   FOOT EXAM  11/25/2019    [name] does not need a referral for Community Assistance: Care Management:   no Social Work:    no Prescription Assistance:  no Nutrition/Diabetes Education:  no   Plan:  Personalized Goals Goals Addressed             This Visit's Progress    Patient Stated       Start eating 3 balanced meals per day and continue daily walking for exercise.        Personalized Health Maintenance & Screening Recommendations     Lung Cancer Screening Recommended: no (Low Dose CT Chest recommended if Age 68-80 years, 30 pack-year currently smoking OR have quit w/in past 15 years) Hepatitis C Screening recommended: no HIV Screening recommended: no  Advanced Directives: Written information was not prepared per patient's request.  Referrals & Orders No orders of the defined types were placed in this encounter.   Follow-up  Plan Follow-up with [name], DO as planned   I have personally reviewed and noted the following in the patient's chart:   Medical and social history Use of alcohol, tobacco or illicit drugs  Current medications and supplements Functional ability and status Nutritional status Physical activity Advanced directives List of other physicians Hospitalizations, surgeries, and ER visits in previous 12 months Vitals Screenings to include cognitive, depression, and falls Referrals and appointments  In addition, I have reviewed and discussed with [name] certain preventive protocols, quality metrics, and best practice recommendations. A written personalized care plan for preventive services as well as general preventive  health recommendations is available and can be mailed to the patient at her request.      Adella Hare, LPN  [id number]/82/9562   I have reviewed and agree with the above AWV documentation.   Jannifer Rodney, FNP

## 2020-03-21 ENCOUNTER — Telehealth: Payer: Medicare Other | Admitting: Nurse Practitioner

## 2020-03-21 DIAGNOSIS — B001 Herpesviral vesicular dermatitis: Secondary | ICD-10-CM

## 2020-03-21 MED ORDER — VALACYCLOVIR HCL 1 G PO TABS: ORAL_TABLET | ORAL | 0 refills | Status: DC

## 2020-03-21 NOTE — Progress Notes (Signed)
We are sorry that you are not feeling well.  Here is how we plan to help!  Based on what you have shared with me it does look like you have a viral infection.    Most cold sores or fever blisters are small fluid filled blisters around the mouth caused by herpes simplex virus.  The most common strain of the virus causing cold sores is herpes simplex virus 1.  It can be spread by skin contact, sharing eating utensils, or even sharing towels.  Cold sores are contagious to other people until dry. (Approximately 5-7 days).  Wash your hands. You can spread the virus to your eyes through handling your contact lenses after touching the lesions.  Most people experience pain at the sight or tingling sensations in their lips that may begin before the ulcers erupt.  Herpes simplex is treatable but not curable.  It may lie dormant for a long time and then reappear due to stress or prolonged sun exposure.  Many patients have success in treating their cold sores with an over the counter topical called Abreva.  You may apply the cream up to 5 times daily (maximum 10 days) until healing occurs.  If you would like to use an oral antiviral medication to speed the healing of your cold sore, I have sent a prescription to your local pharmacy Valacyclovir 2 gm take one by mouth twice a day for 1 day    HOME CARE:  Wash your hands frequently. Do not pick at or rub the sore. Don't open the blisters. Avoid kissing other people during this time. Avoid sharing drinking glasses, eating utensils, or razors. Do not handle contact lenses unless you have thoroughly washed your hands with soap and warm water! Avoid oral sex during this time.  Herpes from sores on your mouth can spread to your partner's genital area. Avoid contact with anyone who has eczema or a weakened immune system. Cold sores are often triggered by exposure to intense sunlight, use a lip balm containing a sunscreen (SPF 30 or higher).  GET HELP RIGHT AWAY  IF:  Blisters look infected. Blisters occur near or in the eye. Symptoms last longer than 10 days. Your symptoms become worse.  MAKE SURE YOU:  Understand these instructions. Will watch your condition. Will get help right away if you are not doing well or get worse.    Your e-visit answers were reviewed by a board certified advanced clinical practitioner to complete your personal care plan.  Depending upon the condition, your plan could have  Included both over the counter or prescription medications.    Please review your pharmacy choice.  Be sure that the pharmacy you have chosen is open so that you can pick up your prescription now.  If there is a problem you can message your provider in MyChart to have the prescription routed to another pharmacy.    Your safety is important to us.  If you have drug allergies check our prescription carefully.  For the next 24 hours you can use MyChart to ask questions about today's visit, request a non-urgent call back, or ask for a work or school excuse from your e-visit provider.  You will get an email in the next two days asking about your experience.  I hope that your e-visit has been valuable and will speed your recovery.  5-10 minutes spent reviewing and documenting in chart.     chart.

## 2020-04-04 ENCOUNTER — Ambulatory Visit: Payer: Medicare Other | Admitting: Family Medicine

## 2020-04-13 ENCOUNTER — Other Ambulatory Visit: Payer: Self-pay | Admitting: Family Medicine

## 2020-04-14 ENCOUNTER — Other Ambulatory Visit: Payer: Self-pay | Admitting: Family Medicine

## 2020-04-15 NOTE — Telephone Encounter (Signed)
[facility]. NTBS for 3 mos ckup. Mail order sent

## 2020-04-30 ENCOUNTER — Other Ambulatory Visit: Payer: Self-pay | Admitting: *Deleted

## 2020-04-30 NOTE — Telephone Encounter (Signed)
Appointment scheduled.

## 2020-04-30 NOTE — Telephone Encounter (Signed)
[facility]. NTBS for 3 mos ckup from Feb. Mail order not sent

## 2020-05-02 ENCOUNTER — Other Ambulatory Visit: Payer: Self-pay | Admitting: *Deleted

## 2020-05-02 MED ORDER — ARIPIPRAZOLE 5 MG PO TABS: 5.0000 mg | ORAL_TABLET | Freq: Every day | ORAL | 0 refills | Status: DC

## 2020-05-02 NOTE — Telephone Encounter (Signed)
Appt 05/27/20

## 2020-05-05 ENCOUNTER — Encounter: Payer: Self-pay | Admitting: Family Medicine

## 2020-05-06 MED ORDER — ESCITALOPRAM OXALATE 20 MG PO TABS: 20.0000 mg | ORAL_TABLET | Freq: Every day | ORAL | 2 refills | Status: DC

## 2020-05-27 ENCOUNTER — Other Ambulatory Visit: Payer: Self-pay

## 2020-05-27 ENCOUNTER — Encounter: Payer: Self-pay | Admitting: Family Medicine

## 2020-05-27 ENCOUNTER — Ambulatory Visit (INDEPENDENT_AMBULATORY_CARE_PROVIDER_SITE_OTHER): Payer: Medicare Other | Admitting: Family Medicine

## 2020-05-27 VITALS — BP 136/79 | HR 71 | Temp 97.5°F | Ht 60.0 in | Wt 175.0 lb

## 2020-05-27 DIAGNOSIS — E1169 Type 2 diabetes mellitus with other specified complication: Secondary | ICD-10-CM

## 2020-05-27 DIAGNOSIS — E119 Type 2 diabetes mellitus without complications: Secondary | ICD-10-CM

## 2020-05-27 DIAGNOSIS — F329 Major depressive disorder, single episode, unspecified: Secondary | ICD-10-CM

## 2020-05-27 DIAGNOSIS — E1159 Type 2 diabetes mellitus with other circulatory complications: Secondary | ICD-10-CM

## 2020-05-27 DIAGNOSIS — R634 Abnormal weight loss: Secondary | ICD-10-CM

## 2020-05-27 DIAGNOSIS — I10 Essential (primary) hypertension: Secondary | ICD-10-CM

## 2020-05-27 DIAGNOSIS — F419 Anxiety disorder, unspecified: Secondary | ICD-10-CM

## 2020-05-27 DIAGNOSIS — E785 Hyperlipidemia, unspecified: Secondary | ICD-10-CM

## 2020-05-27 LAB — BAYER DCA HB A1C WAIVED: HB A1C (BAYER DCA - WAIVED): 5.4 % (ref <7.0)

## 2020-05-27 MED ORDER — METFORMIN HCL 1000 MG PO TABS: 1000.0000 mg | ORAL_TABLET | Freq: Every day | ORAL | 3 refills | Status: DC

## 2020-05-27 NOTE — Patient Instructions (Signed)
Sugar is a little on the low side. Decrease Metformin to just once a day with breakfast.  I am worried about the weight loss you have had since last time we saw each other.  Please make sure that you are getting your typical cancer screening including colon cancer screening with your gastric doctor.  I am going to run some additional labs to make sure that were not missing anything.

## 2020-05-27 NOTE — Progress Notes (Signed)
Subjective: CC: Melissa/u DM2, HTN, HLD PCP: Janora Norlander, DO KCM:[id number] Melissa Bowman is a 69 y.o. female presenting to clinic today for:  1. Type 2 Diabetes with hypertension, hyperlipidemia and fatty liver disease:  History: Onset 5 years ago.  Previous intolerance to multiple statins. No hospitalizations for DM  Patient reports compliance with her medications.  Overall she feels that she is doing well.  She notes that she feels her best when she has blood sugars around 120.  Denies any hypoglycemic episodes.  Taking medication(s): Metformin 1000 mg twice daily, fenofibrate daily, losartan 100 mg daily.    Last eye exam: Needs, sees my eye doctor. Last foot exam: Needs Last A1c:  Lab Results  Component Value Date   HGBA1C 5.6 10/30/2019   Nephropathy screen indicated?: on ARB Last flu, zoster and/or pneumovax: Up-to-date Immunization History  Administered Date(s) Administered  . Fluad Quad(high Dose 65+) 10/30/2019  . Influenza, High Dose Seasonal PF 10/06/2018  . Influenza,inj,Quad PF,6+ Mos 09/28/2013, 09/27/2014, 10/15/2015, 11/03/2016  . Moderna SARS-COVID-2 Vaccination 02/26/2020, 03/25/2020  . Pneumococcal Conjugate-13 05/25/2018  . Pneumococcal Polysaccharide-23 10/30/2019  . Tdap 09/27/2014  . Zoster 01/31/2013  . Zoster Recombinat (Shingrix) 02/07/2019    ROS: Denies dizziness, LOC, polyuria, polydipsia, unintended weight loss/gain, foot ulcerations, numbness or tingling in extremities, shortness of breath or chest pain.  2.  Depression/anxiety History: Longstanding history of anxiety and depression.  Patient has seen a counselor in the past but has no intention of seeing 1 again.  Previously failed Effexor.  Patient is treated with Lexapro 20 mg daily, Abilify 5 mg daily and amitriptyline 10 mg daily by previous PCP.  She continues to feel well on this.  No concerns today.   ROS: Per HPI  Allergies  Allergen Reactions  . Statins Other (See Comments)     myalgias  . E-Mycin [Erythromycin] Other (See Comments)    Medication intolerance stomach cramps  . Estrogens Other (See Comments)    Estrogen replacement Therapy intolerance mood changes  . Sulfa Antibiotics Other (See Comments)    Causes severe stomach cramps   . Vioxx [Rofecoxib] Other (See Comments)    Unknown reaction/interaction. Possible stomach cramps   Past Medical History:  Diagnosis Date  . Adrenal adenoma    left side   . Allergic rhinitis   . 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 Anxiety and depression   . Asthmatic bronchitis   . Bilateral carpal tunnel syndrome   . Condyloma acuminata   . Diabetes mellitus without complication ([place])   . Diverticulosis   . Eczema    STABLE   . GERD (gastroesophageal reflux disease)   . H. pylori infection 11/16/1993  . Hiatal hernia   . History of benign adrenal tumor 2007  . History of benign adrenal tumor [facility]   [facility]   . History of pneumonia 2007, 2008  . Hyperlipidemia   . Hypertension   . IBS (irritable bowel syndrome)   . Internal hemorrhoids   . Kyphosis   . Memory loss   . Metabolic syndrome   . Otitis media, serous, acute   . Sleep apnea    uses CPAP  . Symptomatic menopausal or female climacteric states   . Vitamin D deficiency     Current Outpatient Medications:  .  amitriptyline (ELAVIL) 10 MG tablet, TAKE 1 TABLET BY MOUTH AT  BEDTIME, Disp: 90 tablet, Rfl: 0 .  amLODipine (NORVASC) 5 MG tablet, Take 1 tablet (5 mg total) by mouth  daily., Disp: 90 tablet, Rfl: 2 .  ARIPiprazole (ABILIFY) 5 MG tablet, Take 1 tablet (5 mg total) by mouth daily., Disp: 90 tablet, Rfl: 0 .  Ascorbic Acid (VITAMIN C) 1000 MG tablet, Take 1,000 mg by mouth daily., Disp: , Rfl:  .  aspirin EC 81 MG tablet, Take 1 tablet (81 mg total) by mouth daily., Disp: , Rfl:  .  Calcium Carb-Cholecalciferol (CALCIUM 600+D3) 600-800 MG-UNIT TABS, Take 2 tablets by mouth daily., Disp: , Rfl:  .  Cholecalciferol (VITAMIN D-3) 5000 UNITS TABS, Take 5,000 Units  by mouth daily. , Disp: , Rfl:  .  escitalopram (LEXAPRO) 20 MG tablet, Take 1 tablet (20 mg total) by mouth daily., Disp: 30 tablet, Rfl: 2 .  fenofibrate 160 MG tablet, Take 1 tablet (160 mg total) by mouth daily., Disp: 90 tablet, Rfl: 2 .  fluticasone (FLONASE) 50 MCG/ACT nasal spray, Use 2 sprays in each  nostril daily (Patient taking differently: Use 2 sprays in each  nostril daily as needed for congestion), Disp: 48 g, Rfl: 2 .  losartan-hydrochlorothiazide (HYZAAR) 100-25 MG tablet, TAKE 1 TABLET BY MOUTH  EVERY DAY AS DIRECTED, Disp: 90 tablet, Rfl: 3 .  metFORMIN (GLUCOPHAGE) 1000 MG tablet, TAKE 1 TABLET BY MOUTH  TWICE DAILY WITH A MEAL, Disp: 180 tablet, Rfl: 3 .  montelukast (SINGULAIR) 10 MG tablet, TAKE 1 TABLET BY MOUTH  DAILY, Disp: 90 tablet, Rfl: 3 .  mupirocin ointment (BACTROBAN) 2 %, Apply 1 application topically 2 (two) times daily., Disp: 22 g, Rfl: 3 .  omeprazole (PRILOSEC) 40 MG capsule, TAKE 1 CAPSULE BY MOUTH  DAILY, Disp: 90 capsule, Rfl: 3 .  PROAIR HFA 108 (90 BASE) MCG/ACT inhaler, Use 2 puffs into the lungs  every 6 hours as needed for wheezing or shortness of  breath (Patient taking differently: Use 2 puffs into the lungs  every 6 hours as needed for wheezing or shortness of  breath (spring time ONLY)), Disp: 25.5 g, Rfl: 2 .  SYMBICORT 160-4.5 MCG/ACT inhaler, Use 2 puffs two times daily (Patient taking differently: Use 2 puffs two times daily as needed (springtime ONLY)), Disp: 30.6 g, Rfl: 2 .  valACYclovir (VALTREX) 1000 MG tablet, 2 po BID for 1 day at fever blister onset. May repeat if reoccurs, Disp: 12 tablet, Rfl: 0 Social History   Socioeconomic History  . Marital status: Married    Spouse name: [name]  . Number of children: 2  . Years of education: some college  . Highest education level: Some college, no degree  Occupational History  . Occupation: retired    Comment: daycare  Tobacco Use  . Smoking status: Never Smoker  . Smokeless tobacco:  Never Used  Vaping Use  . Vaping Use: Never used  Substance and Sexual Activity  . Alcohol use: No  . Drug use: No  . Sexual activity: Not on file  Other Topics Concern  . Not on file  Social History Narrative   Melissa Bowman is retired. She lives with her husband [name]. They have two grown daughters that live locally. Lindaann enjoys reading and puzzles.    Social Determinants of Health   Financial Resource Strain:   . Difficulty of Paying Living Expenses:   Food Insecurity:   . Worried About Charity fundraiser in the Last Year:   . Arboriculturist in the Last Year:   Transportation Needs:   . Film/video editor (Medical):   Marland Kitchen Lack of Transportation (Non-Medical):  Physical Activity:   . Days of Exercise per Week:   . Minutes of Exercise per Session:   Stress:   . Feeling of Stress :   Social Connections:   . Frequency of Communication with Friends and Family:   . Frequency of Social Gatherings with Friends and Family:   . Attends Religious Services:   . Active Member of Clubs or Organizations:   . Attends Archivist Meetings:   Marland Kitchen Marital Status:   Intimate Partner Violence:   . Fear of Current or Ex-Partner:   . Emotionally Abused:   Marland Kitchen Physically Abused:   . Sexually Abused:    Family History  Problem Relation Age of Onset  . Parkinsonism Father   . Emphysema Father   . Hip fracture Father   . Heart disease Mother   . COPD Brother   . Hypertension Brother   . Heart disease Brother   . Heart failure Sister   . Ovarian cancer Sister   . COPD Sister   . Lymphoma Sister   . COPD Sister   . COPD Brother     Objective: Office vital signs reviewed. BP 136/79   Pulse 71   Temp (!) 97.5 Melissa (36.4 C) (Temporal)   Ht 5' (1.524 m)   Wt 175 lb (79.4 kg)   SpO2 98%   BMI 34.18 kg/m   Physical Examination:  General: Awake, alert, No acute distress HEENT: Normal.  Sclera white.  Mucous membranes.   Cardio: regular rate and rhythm, S1S2 heard.  Soft  systolic murmur noted at the left sternal border. Pulm: clear to auscultation bilaterally, no wheezes, rhonchi or rales; normal work of breathing on room air Extremities: warm, well perfused, No edema, cyanosis or clubbing; +2 pulses bilaterally Psych: Mood stable, speech normal, affect flat.  Somewhat hard of hearing. Depression screen [facility] 2/9 05/27/2020 03/04/2020 10/30/2019  Decreased Interest 0 0 0  Down, Depressed, Hopeless 0 0 0  PHQ - 2 Score 0 0 0  Altered sleeping 0 - 0  Tired, decreased energy 0 - 0  Change in appetite 0 - 0  Feeling bad or failure about yourself  0 - 0  Trouble concentrating 0 - 0  Moving slowly or fidgety/restless 0 - 0  Suicidal thoughts 0 - 0  PHQ-9 Score 0 - 0   GAD 7 : Generalized Anxiety Score 05/27/2020 10/30/2019  Nervous, Anxious, on Edge 0 0  Control/stop worrying 0 1  Worry too much - different things 0 2  Trouble relaxing 0 0  Restless 0 0  Easily annoyed or irritable 0 0  Afraid - awful might happen 0 0  Total GAD 7 Score 0 3  Anxiety Difficulty - Not difficult at all   Diabetic Foot Exam - Simple   Simple Foot Form Diabetic Foot exam was performed with the following findings: Yes 05/27/2020  6:08 PM  Visual Inspection No deformities, no ulcerations, no other skin breakdown bilaterally: Yes Sensation Testing Intact to touch and monofilament testing bilaterally: Yes Pulse Check Posterior Tibialis and Dorsalis pulse intact bilaterally: Yes Comments     Assessment/ Plan: 69 y.o. female   1. Type 2 diabetes mellitus without complication, without long-term current use of insulin (HCC) Under excellent control.  Okay to reduce Metformin to once daily given A1c of less than 6.  We will follow-up in the next 3 months for recheck - Bayer DCA Hb A1c Waived  2. Hyperlipidemia associated with type 2 diabetes mellitus ([place]) Continue  fenofibrate  3. Hypertension associated with diabetes ([place]) Controlled  4. Unintended weight loss Uncertain  etiology.  She is down about 20 pounds since last visit.  She admitted to carb restriction and portion control.  However, from what I can understand during her visit this is not much different than what she was doing last year.  I am going to check a few labs.  I did encourage her to follow-up with gastroenterology if labs are unremarkable to rule out other etiologies. - CBC with Differential - Thyroid Panel With TSH - CMP14+EGFR  5. Anxiety and depression Reportedly stable   Orders Placed This Encounter  Procedures  . Bayer DCA Hb A1c Waived   No orders of the defined types were placed in this encounter.    Janora Norlander, DO [place] [phone]

## 2020-05-28 ENCOUNTER — Other Ambulatory Visit: Payer: Self-pay | Admitting: Family Medicine

## 2020-05-28 LAB — THYROID PANEL WITH TSH
Free Thyroxine Index: 2.3 (ref 1.2–4.9)
T3 Uptake Ratio: 23 % — ABNORMAL LOW (ref 24–39)
T4, Total: 9.8 ug/dL (ref 4.5–12.0)
TSH: 1.82 u[IU]/mL (ref 0.450–4.500)

## 2020-05-28 LAB — CMP14+EGFR
ALT: 15 IU/L (ref 0–32)
AST: 16 IU/L (ref 0–40)
Albumin/Globulin Ratio: 2.4 — ABNORMAL HIGH (ref 1.2–2.2)
Albumin: 4.6 g/dL (ref 3.8–4.8)
Alkaline Phosphatase: 44 IU/L — ABNORMAL LOW (ref 48–121)
BUN/Creatinine Ratio: 31 — ABNORMAL HIGH (ref 12–28)
BUN: 20 mg/dL (ref 8–27)
Bilirubin Total: 0.2 mg/dL (ref 0.0–1.2)
CO2: 22 mmol/L (ref 20–29)
Calcium: 9.5 mg/dL (ref 8.7–10.3)
Chloride: 104 mmol/L (ref 96–106)
Creatinine, Ser: 0.64 mg/dL (ref 0.57–1.00)
GFR calc Af Amer: 105 mL/min/{1.73_m2} (ref >59)
GFR calc non Af Amer: 91 mL/min/{1.73_m2} (ref >59)
Globulin, Total: 1.9 g/dL (ref 1.5–4.5)
Glucose: 105 mg/dL — ABNORMAL HIGH (ref 65–99)
Potassium: 4 mmol/L (ref 3.5–5.2)
Sodium: 141 mmol/L (ref 134–144)
Total Protein: 6.5 g/dL (ref 6.0–8.5)

## 2020-05-28 LAB — CBC WITH DIFFERENTIAL/PLATELET
Basophils Absolute: 0.1 10*3/uL (ref 0.0–0.2)
Basos: 1 %
EOS (ABSOLUTE): 0.1 10*3/uL (ref 0.0–0.4)
Eos: 2 %
Hematocrit: 35.1 % (ref 34.0–46.6)
Hemoglobin: 12.3 g/dL (ref 11.1–15.9)
Immature Grans (Abs): 0 10*3/uL (ref 0.0–0.1)
Immature Granulocytes: 1 %
Lymphocytes Absolute: 3.4 10*3/uL — ABNORMAL HIGH (ref 0.7–3.1)
Lymphs: 46 %
MCH: 30.8 pg (ref 26.6–33.0)
MCHC: 35 g/dL (ref 31.5–35.7)
MCV: 88 fL (ref 79–97)
Monocytes Absolute: 0.3 10*3/uL (ref 0.1–0.9)
Monocytes: 5 %
Neutrophils Absolute: 3.5 10*3/uL (ref 1.4–7.0)
Neutrophils: 45 %
Platelets: 299 10*3/uL (ref 150–450)
RBC: 4 x10E6/uL (ref 3.77–5.28)
RDW: 13.7 % (ref 11.7–15.4)
WBC: 7.5 10*3/uL (ref 3.4–10.8)

## 2020-05-30 ENCOUNTER — Other Ambulatory Visit: Payer: Self-pay | Admitting: Family Medicine

## 2020-05-30 DIAGNOSIS — Z820 Family history of epilepsy and other diseases of the nervous system: Secondary | ICD-10-CM

## 2020-05-30 DIAGNOSIS — R269 Unspecified abnormalities of gait and mobility: Secondary | ICD-10-CM

## 2020-05-30 DIAGNOSIS — R413 Other amnesia: Secondary | ICD-10-CM

## 2020-06-13 ENCOUNTER — Other Ambulatory Visit: Payer: Self-pay | Admitting: Family Medicine

## 2020-06-13 NOTE — Telephone Encounter (Signed)
Last office visit 05/27/2020 Last refill 01/14/2020, #90 no refills

## 2020-07-04 ENCOUNTER — Other Ambulatory Visit: Payer: Self-pay | Admitting: Family Medicine

## 2020-07-09 ENCOUNTER — Other Ambulatory Visit: Payer: Self-pay

## 2020-07-09 ENCOUNTER — Other Ambulatory Visit: Payer: Self-pay | Admitting: Family Medicine

## 2020-07-09 ENCOUNTER — Ambulatory Visit
Admission: RE | Admit: 2020-07-09 | Discharge: 2020-07-09 | Disposition: A | Discharge status: Home / Self Care | Payer: Medicare Other | Source: Ambulatory Visit | Attending: Family Medicine | Admitting: Family Medicine

## 2020-07-09 DIAGNOSIS — Z1231 Encounter for screening mammogram for malignant neoplasm of breast: Secondary | ICD-10-CM | POA: Diagnosis not present

## 2020-07-21 ENCOUNTER — Other Ambulatory Visit: Payer: Self-pay | Admitting: Family Medicine

## 2020-08-06 ENCOUNTER — Encounter: Payer: Self-pay | Admitting: Neurology

## 2020-08-06 ENCOUNTER — Ambulatory Visit: Payer: Medicare Other | Admitting: Neurology

## 2020-08-06 VITALS — BP 128/80 | HR 64 | Ht 61.0 in | Wt 171.3 lb

## 2020-08-06 DIAGNOSIS — R413 Other amnesia: Secondary | ICD-10-CM | POA: Diagnosis not present

## 2020-08-06 DIAGNOSIS — R410 Disorientation, unspecified: Secondary | ICD-10-CM | POA: Diagnosis not present

## 2020-08-06 DIAGNOSIS — F39 Unspecified mood [affective] disorder: Secondary | ICD-10-CM

## 2020-08-06 DIAGNOSIS — G20 Parkinson's disease: Secondary | ICD-10-CM | POA: Diagnosis not present

## 2020-08-06 DIAGNOSIS — R634 Abnormal weight loss: Secondary | ICD-10-CM

## 2020-08-06 NOTE — Progress Notes (Signed)
Subjective:    Patient ID: Melissa Bowman is a 69 y.o. female.  HPI     Star Age, MD, PhD [facility] [address] [place], [place] [address]  Dear Dr. Lajuana Ripple,   I saw your patient, Melissa Bowman, upon your kind request, in my neurologic clinic today for Initial consultation of her memory loss and gait disorder, family history of Parkinson's disease. The patient is accompanied by her husband today.  As you know, Melissa Bowman is a 69 year old right-handed woman with an underlying medical history of vitamin D deficiency, hypertension, hyperlipidemia, irritable bowel syndrome, eczema, reflux disease, diverticulosis, diabetes, anxiety, depression, allergic rhinitis, adrenal tumor, sleep apnea and obesity, who reports very little of her own history, primarily due to hearing loss. Her history is primarily provided by her husband, who reports that since approximately April of this year the patient has had more difficulty with her balance and mobility.  She has had changes in her mood.  He reports that she has a longstanding history of depression and anxiety and has tried different medications.  She was on Effexor for years and recently she was placed on Abilify and Lexapro.  This was around March 2021.  She has had changes in her posture and her gait, she has been noted to be slower in her movements.  This has also been noted by her daughter who is a Marine scientist.  She endorses stress, particularly with regards to her other daughter.  Both daughters live close by.  She has a total of 5 grandchildren and has been taking care of 2 grandchildren ages 67 and 60 but the 10-year-old recently started school which has improved her stress level she indicates.  She has not fallen recently but has fallen in the past.  She is the youngest of a total of 16 children and most of her siblings passed away, she denies any history of tremor in her siblings or memory loss but reports that  her father had Parkinson's disease and died at age 70 and her brother had Parkinson's disease and Alzheimer's dementia and died in his 69s, probably around 54.  She has now 3 sisters alive and one more brother alive.  She is a non-smoker and drinks no alcohol, she likes to drink coffee and drinks about 2 to 3 cups/day.  She has had unintended weight loss since approximately April of this year and had some work-up for this.  She denies any loss of appetite.  She has hearing aids but does not wear them.  She had surgery for sleep apnea in 1997 and also uses a CPAP machine.  She reports trouble with her right shoulder and had 3 rotator cuff surgeries.  She has had some memory loss including forgetfulness.  She has been sleeping fairly well but does have a history of dream enactment behavior for the past 1+ year.  Within the past year she fell out of bed twice, she reports that she rolled out of bed and did not injure herself and went back to bed.  She takes melatonin for sleep.  She had a head CT and cervical spine CT without contrast after a fall on 11/17/2017 reviewed the results: IMPRESSION: Left frontoparietal scalp contusion.   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 No acute intracranial abnormality.   No acute cervical spine fracture or posttraumatic listhesis.   Her Past Medical History Is Significant For: Past Medical History:  Diagnosis Date  . Adrenal  adenoma    left side   . Allergic rhinitis   . 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 Anxiety and depression   . Asthmatic bronchitis   . Bilateral carpal tunnel syndrome   . Condyloma acuminata   . Diabetes mellitus without complication ([place])   . Diverticulosis   . Eczema    STABLE   . GERD (gastroesophageal reflux disease)   . H. pylori infection 11/16/1993  . Hiatal hernia   . History of benign adrenal tumor 2007  . History of benign adrenal tumor [facility]   [facility]   . History of pneumonia 2007, 2008  . Hyperlipidemia   .  Hypertension   . IBS (irritable bowel syndrome)   . Internal hemorrhoids   . Kyphosis   . Memory loss   . Metabolic syndrome   . Otitis media, serous, acute   . Sleep apnea    uses CPAP  . Symptomatic menopausal or female climacteric states   . Vitamin D deficiency     Her Past Surgical History Is Significant For: Past Surgical History:  Procedure Laterality Date  . abdominal wall lematoma of fabdd hys.    . APPENDECTOMY    . CARPAL TUNNEL RELEASE    . CESAREAN SECTION     [place]    . right breast biopsy  10/96  . ROTATOR CUFF REPAIR  2002   right  . ROTATOR CUFF REPAIR/ bone spur  08/30/09  . SHOULDER ARTHROSCOPY WITH ROTATOR CUFF REPAIR AND SUBACROMIAL DECOMPRESSION Right 01/19/2018   Procedure: REVISION RIGHT MINI OPEN ROTATOR CUFF REPAIR AND SUBACROMIAL DECOMPRESSION;  Surgeon: Susa Day, MD;  Location: [facility];  Service: Orthopedics;  Laterality: Right;  90 MINS  . sleep apnea surgery  5/96  . torn cartiledge rt knee x2  05/13/06   Dr. Charlotta Newton   . TOTAL ABDOMINAL HYSTERECTOMY W/ BILATERAL SALPINGOOPHORECTOMY  8/94   fibroids     Her Family History Is Significant For: Family History  Problem Relation Age of Onset  . Parkinsonism Father   . Emphysema Father   . Hip fracture Father   . Heart disease Mother   . COPD Brother   . Hypertension Brother   . Heart disease Brother   . Heart failure Sister   . Ovarian cancer Sister   . COPD Sister   . Lymphoma Sister   . COPD Sister   . COPD Brother     Her Social History Is Significant For: Social History   Socioeconomic History  . Marital status: Married    Spouse name: [name]  . Number of children: 2  . Years of education: some college  . Highest education level: Some college, no degree  Occupational History  . Occupation: retired    Comment: daycare  Tobacco Use  . Smoking status: Never Smoker  . Smokeless tobacco: Never Used  Vaping Use  . Vaping Use: Never used  Substance  and Sexual Activity  . Alcohol use: No  . Drug use: No  . Sexual activity: Not on file  Other Topics Concern  . Not on file  Social History Narrative   Melissa Bowman is retired. She lives with her husband [name]. They have two grown daughters that live locally. Alyza enjoys reading and puzzles.    Social Determinants of Health   Financial Resource Strain:   . Difficulty of Paying Living Expenses: Not on file  Food Insecurity:   . Worried About Charity fundraiser in  the Last Year: Not on file  . Ran Out of Food in the Last Year: Not on file  Transportation Needs:   . Lack of Transportation (Medical): Not on file  . Lack of Transportation (Non-Medical): Not on file  Physical Activity:   . Days of Exercise per Week: Not on file  . Minutes of Exercise per Session: Not on file  Stress:   . Feeling of Stress : Not on file  Social Connections:   . Frequency of Communication with Friends and Family: Not on file  . Frequency of Social Gatherings with Friends and Family: Not on file  . Attends Religious Services: Not on file  . Active Member of Clubs or Organizations: Not on file  . Attends Archivist Meetings: Not on file  . Marital Status: Not on file    Her Allergies Are:  Allergies  Allergen Reactions  . Statins Other (See Comments)    myalgias  . E-Mycin [Erythromycin] Other (See Comments)    Medication intolerance stomach cramps  . Estrogens Other (See Comments)    Estrogen replacement Therapy intolerance mood changes  . Sulfa Antibiotics Other (See Comments)    Causes severe stomach cramps   . Vioxx [Rofecoxib] Other (See Comments)    Unknown reaction/interaction. Possible stomach cramps  :   Her Current Medications Are:  Outpatient Encounter Medications as of 08/06/2020  Medication Sig  . amitriptyline (ELAVIL) 10 MG tablet TAKE 1 TABLET BY MOUTH AT  BEDTIME  . amLODipine (NORVASC) 5 MG tablet TAKE 1 TABLET BY MOUTH  DAILY  . ARIPiprazole (ABILIFY) 5 MG tablet  TAKE 1 TABLET BY MOUTH  DAILY  . Ascorbic Acid (VITAMIN C) 1000 MG tablet Take 1,000 mg by mouth daily.  Marland Kitchen aspirin EC 81 MG tablet Take 1 tablet (81 mg total) by mouth daily.  . Calcium Carb-Cholecalciferol (CALCIUM 600+D3) 600-800 MG-UNIT TABS Take 2 tablets by mouth daily.  . Cholecalciferol (VITAMIN D-3) 5000 UNITS TABS Take 5,000 Units by mouth daily.   Marland Kitchen escitalopram (LEXAPRO) 20 MG tablet TAKE 1 TABLET BY MOUTH  DAILY  . fenofibrate 160 MG tablet Take 1 tablet (160 mg total) by mouth daily.  . fluticasone (FLONASE) 50 MCG/ACT nasal spray Use 2 sprays in each  nostril daily  . losartan-hydrochlorothiazide (HYZAAR) 100-25 MG tablet TAKE 1 TABLET BY MOUTH  EVERY DAY AS DIRECTED  . metFORMIN (GLUCOPHAGE) 1000 MG tablet Take 1 tablet (1,000 mg total) by mouth daily with breakfast.  . montelukast (SINGULAIR) 10 MG tablet TAKE 1 TABLET BY MOUTH  DAILY  . mupirocin ointment (BACTROBAN) 2 % Apply 1 application topically 2 (two) times daily.  Marland Kitchen omeprazole (PRILOSEC) 40 MG capsule TAKE 1 CAPSULE BY MOUTH  DAILY  . PROAIR HFA 108 (90 BASE) MCG/ACT inhaler Use 2 puffs into the lungs  every 6 hours as needed for wheezing or shortness of  breath  . SYMBICORT 160-4.5 MCG/ACT inhaler Use 2 puffs two times daily  . valACYclovir (VALTREX) 1000 MG tablet 2 po BID for 1 day at fever blister onset. May repeat if reoccurs   No facility-administered encounter medications on file as of 08/06/2020.  :   Review of Systems:  Out of a complete 14 point review of systems, all are reviewed and negative with the exception of these symptoms as listed below:    Review of Systems  Neurological:       Here for consult on Parkinson's disease. Pt reports father and brother were dx/passed due to  PD. Pt reports imbalance and confusion. She denies any recent falls.    Objective:  Neurological Exam  Physical Exam Physical Examination:   Vitals:   08/06/20 0957  BP: 128/80  Pulse: 64    General Examination: The  patient is a very pleasant 69 y.o. female in no acute distress. She appears  well-developed and well-nourished and well groomed.   HEENT: Normocephalic, atraumatic, pupils are equal, round and reactive to light and accommodation, extraocular tracking is mildly impaired with saccadic breakdown of smooth pursuit.  She has mild facial masking.  She has mild to moderate nuchal rigidity.  She has a lower lip and jaw tremor.  She has mild hypophonia.  She is rather hard of hearing, no hearing aids in place.  Speech is otherwise clear, no dysarthria, airway examination reveals mild mouth dryness, status post UPPP.  Tongue protrudes centrally in palate elevates symmetrically. Facial sensation is intact.  No carotid bruits.  Chest: Clear to auscultation without wheezing, rhonchi or crackles noted.  Heart: S1+S2+0, regular and normal without murmurs, rubs or gallops noted.   Abdomen: Soft, non-tender and non-distended with normal bowel sounds appreciated on auscultation.  Extremities: There is no pitting edema in the distal lower extremities bilaterally.  Skin: Warm and dry without trophic changes noted.  Musculoskeletal: exam reveals decreased mobility and right shoulder.   Neurologically:  Mental status: The patient is awake, alert and oriented in all 4 spheres. Her immediate and remote memory, attention, language skills and fund of knowledge are mildly impaired, she has some bradyphrenia, but she is also quite hard of hearing.  Mood is constricted and affect is blunted.  Cranial nerves II - XII are as described above under HEENT exam. In addition: In addition, left shoulder is higher than the right.  She is able to do a shoulder shrug on both sides. Motor exam: Normal bulk, tone is mildly increased in the upper extremities with slight cogwheeling noted in the right upper extremity only.  She has no significant resting tremor or postural or action tremor in both upper extremities.  There is no drift.  Reflexes are 1+.  Romberg is not tested for safety concerns.  On fine motor testing, she has mild impairment with finger taps, hand movements, and rapid alternating patting with both hands.  In the lower extremities with foot taps, she has mild impairment bilaterally, overall, no significant lateralization noted.  She has overall mild slowness in her movements, no dyskinesias.  Cerebellar testing: No dysmetria or intention tremor on finger to nose testing. There is no truncal or gait ataxia.  Sensory exam: intact to light touch in the upper and lower extremities.  Gait, station and balance: She stands up with mild difficulty and pushes herself up.  Her posture is mildly stooped for age, she walks with slowly and has decreased, near absence of arm swing bilaterally.   She turns slowly. No walking aid.   Assessment and Plan:   In summary, Melissa Bowman is a very pleasant 69 y.o.-year old female with an underlying medical history of vitamin D deficiency, hypertension, hyperlipidemia, irritable bowel syndrome, eczema, reflux disease, diverticulosis, diabetes, anxiety, depression, allergic rhinitis, adrenal tumor, sleep apnea and obesity, who presents for evaluation of her gait disorder, cognitive complaints, changes in posture and fine motor dyscontrol.  Symptoms have been ongoing for several months.  They have been more noticeable since April of this year.  Her husband reports that she did have a lip quiver before she started the  Abilify.  Her history and examination does show evidence of parkinsonism.  There is no telltale lateralization and no telltale resting tremor.  Given her family history of Parkinson's disease, it is possible that she has Parkinson's disease, differential diagnosis includes drug-induced parkinsonism and atypical parkinsonism given the lack of lateralization in particular.  I had a long discussion with the patient and her husband about her symptoms, my findings and the possibility that  she may have idiopathic Parkinson's disease versus medication induced parkinsonism from taking neuroleptic medication.  She has a longstanding history of mood disorder and her husband reports that she has had difficulty with her mood for decades, including significant depression and tearfulness.  She has had some improvement with the new regimen which includes Abilify and Lexapro.  She is advised to discuss with you the possibility of coming off of the Abilify to see if her symptoms and her exam improved.  I am not quite sure that we can say 1 way or another if she has parkinsonism versus idiopathic Parkinson's disease.  Her diagnostic help I would like to proceed with a brain DaTscan which can help in distinguishing drug-induced parkinsonism with other types of parkinsonian syndromes but is not a diagnostic test for idiopathic Parkinson's disease versus atypical parkinsonism.  I talked to them about this test in detail and explained how it is done.  She is agreeable to pursuing this.  I would not recommend quite yet any Parkinson's medication but we can explore possibilities in the near future.  If you are agreeable to taking her off of the Abilify, I would like to see her examination in the near future without the medication.  I would like to see her back in 3 months, sooner if needed.  We talked about the importance of healthy lifestyle and good sleep, importance of good hydration and good nutrition.  We will call them to schedule the DaTscan and also with the results.  I answered all the questions today and the patient and her husband were in agreement with the plan.  Thank you very much for allowing me to participate in the care of this nice patient. If I can be of any further assistance to you please do not hesitate to call me at [phone].  Sincerely,   Star Age, MD, PhD

## 2020-08-06 NOTE — Patient Instructions (Signed)
It was nice to meet you both today!  I think you have signs and symptoms of parkinson's like disease, called parkinsonism.  I am not fully convinced that you have Parkinson's disease but it is not impossible that you have underlying Parkinson's disease or atypical parkinsonism.  It is also possible that you have drug-induced Parkinson's-like features.  As discussed, I would like to see if you can taper off the Abilify, please talk to Dr. Lajuana Ripple about whether or not this is feasible.  I understand that it the Abilify has helped you.  I also understand that you started the Abilify around March and he started having symptoms of slowness and stiffness and gait changes since about April.  I do want to suggest a few additional things today:  Remember to drink plenty of fluid at least 6 glasses (8 oz each), eat healthy meals and do not skip any meals. Try to eat protein with a every meal and eat a healthy snack such as fruit or nuts in between meals. Try to keep a regular sleep-wake schedule and try to exercise daily, particularly in the form of walking, 20-30 minutes a day, if you can.   Try to stay active physically and mentally.   As far as your medications are concerned, I would like hold off on any new meds at this time, but we may consider medication for Parkinson's disease in the near future.   As far as diagnostic testing, as explained, I would like to proceed with a DaT scan: This is a specialized brain scan designed to help with diagnosis of tremor disorders. A radioactive marker gets injected and the uptake is measured in the brain and compared to normal controls and right side is compared to the left, a change in uptake can help with diagnosis of certain tremor disorders. A brain MRI on the other hand is a brain scan that helps look at the brain structure in more detail overall and look for age-related changes, blood vessel related changes and look for stroke and volume loss which we call  atrophy.   I would like to see you back in 3 months, sooner if we need to. Please call us with any interim questions, concerns, problems, updates or refill requests.  Our phone number is [phone]. We also have an after hours call service for urgent matters and there is a physician on-call for urgent questions, that cannot wait till the next work day. For any emergencies you know to call 911 or go to the nearest emergency room.

## 2020-08-07 ENCOUNTER — Encounter: Payer: Self-pay | Admitting: Neurology

## 2020-08-07 LAB — COMPREHENSIVE METABOLIC PANEL
ALT: 9 IU/L (ref 0–32)
AST: 10 IU/L (ref 0–40)
Albumin/Globulin Ratio: 2.3 — ABNORMAL HIGH (ref 1.2–2.2)
Albumin: 4.6 g/dL (ref 3.8–4.8)
Alkaline Phosphatase: 38 IU/L — ABNORMAL LOW (ref 48–121)
BUN/Creatinine Ratio: 39 — ABNORMAL HIGH (ref 12–28)
BUN: 27 mg/dL (ref 8–27)
Bilirubin Total: 0.3 mg/dL (ref 0.0–1.2)
CO2: 26 mmol/L (ref 20–29)
Calcium: 9.9 mg/dL (ref 8.7–10.3)
Chloride: 104 mmol/L (ref 96–106)
Creatinine, Ser: 0.7 mg/dL (ref 0.57–1.00)
GFR calc Af Amer: 102 mL/min/{1.73_m2} (ref >59)
GFR calc non Af Amer: 89 mL/min/{1.73_m2} (ref >59)
Globulin, Total: 2 g/dL (ref 1.5–4.5)
Glucose: 108 mg/dL — ABNORMAL HIGH (ref 65–99)
Potassium: 4 mmol/L (ref 3.5–5.2)
Sodium: 145 mmol/L — ABNORMAL HIGH (ref 134–144)
Total Protein: 6.6 g/dL (ref 6.0–8.5)

## 2020-08-11 ENCOUNTER — Telehealth: Payer: Self-pay | Admitting: Neurology

## 2020-08-11 NOTE — Telephone Encounter (Signed)
Lovena Le ,  DAT scan please fill out Physician Intake form and  DAT scan Referral form . Please print office notes and insurance cards.   I will scan the forms to your e-mail.  Please take form to the nurse so doctor can sign .  When the form is returned please fax to the number on the bottom of the page on the DAT scan Referral form.   After you fax hold on to DAT scan you should then get a E-Mail from [place]  / [employer] th care stating they have received the fax . After that go in apt notes and put today's date and say (please don't schedule until I get approval ) so [facility] will know this is binging worked on.  Agrenta will send you another e-mail stating patient can be scheduled  . Print off e-mail stating patient can be scheduled and there also be attachment with authorization details  .Print and  Scan to your e-mail  And send e-mail to [email or web address]. Make a phone stating you have sent information to [facility]. Thanks So much [employer]

## 2020-08-12 ENCOUNTER — Other Ambulatory Visit: Payer: Self-pay | Admitting: Family Medicine

## 2020-08-12 NOTE — Telephone Encounter (Signed)
Physician Intake and DAT Scan forms completed. Given to MD to sign. Order taken out of queue. FYI only

## 2020-08-13 ENCOUNTER — Encounter: Payer: Self-pay | Admitting: Neurology

## 2020-08-13 NOTE — Telephone Encounter (Signed)
Faxed signed forms to [phone].

## 2020-08-26 ENCOUNTER — Other Ambulatory Visit: Payer: Self-pay | Admitting: Family Medicine

## 2020-09-01 ENCOUNTER — Other Ambulatory Visit: Payer: Self-pay

## 2020-09-01 ENCOUNTER — Encounter: Payer: Self-pay | Admitting: Family Medicine

## 2020-09-01 ENCOUNTER — Telehealth: Payer: Self-pay | Admitting: Pharmacist

## 2020-09-01 ENCOUNTER — Ambulatory Visit (INDEPENDENT_AMBULATORY_CARE_PROVIDER_SITE_OTHER): Payer: Medicare Other | Admitting: Family Medicine

## 2020-09-01 VITALS — BP 121/77 | HR 57 | Temp 98.4°F | Ht 60.0 in | Wt 170.0 lb

## 2020-09-01 DIAGNOSIS — I10 Essential (primary) hypertension: Secondary | ICD-10-CM

## 2020-09-01 DIAGNOSIS — E1169 Type 2 diabetes mellitus with other specified complication: Secondary | ICD-10-CM

## 2020-09-01 DIAGNOSIS — E785 Hyperlipidemia, unspecified: Secondary | ICD-10-CM

## 2020-09-01 DIAGNOSIS — R634 Abnormal weight loss: Secondary | ICD-10-CM | POA: Diagnosis not present

## 2020-09-01 DIAGNOSIS — E1159 Type 2 diabetes mellitus with other circulatory complications: Secondary | ICD-10-CM | POA: Diagnosis not present

## 2020-09-01 DIAGNOSIS — Z23 Encounter for immunization: Secondary | ICD-10-CM

## 2020-09-01 DIAGNOSIS — F329 Major depressive disorder, single episode, unspecified: Secondary | ICD-10-CM

## 2020-09-01 DIAGNOSIS — F419 Anxiety disorder, unspecified: Secondary | ICD-10-CM

## 2020-09-01 DIAGNOSIS — E119 Type 2 diabetes mellitus without complications: Secondary | ICD-10-CM | POA: Diagnosis not present

## 2020-09-01 LAB — BAYER DCA HB A1C WAIVED: HB A1C (BAYER DCA - WAIVED): 5.4 % (ref <7.0)

## 2020-09-01 MED ORDER — ARIPIPRAZOLE 5 MG PO TABS: 2.5000 mg | ORAL_TABLET | Freq: Every day | ORAL | 0 refills | Status: DC

## 2020-09-01 NOTE — Telephone Encounter (Signed)
APPROPRIATE TO WEAN PATIENT TO ABILIFY 2.5MG  DAILY FOR 4-6 WEEKS THEN DISCONTINUE.  PATIENT MAY BE ABLE TO STOP AT 4 WEEKS PENDING RESPONSE.    DISCUSSED WITH PSYCH WILL ROUTE MESSAGE TO PCP

## 2020-09-01 NOTE — Patient Instructions (Signed)
CUT THE ABILIFY in HALF and take 1/2 tablet daily ONLY. Do this for 4 weeks then can stop medication.  STOP Metformin. See me back in 3 months to recheck sugar.

## 2020-09-01 NOTE — Progress Notes (Signed)
Subjective: CC: Melissa Bowman, Melissa Bowman, Melissa PCP: Melissa Bowman, Melissa Bowman is a 69 y.o. female presenting to clinic today for:  1. Type 2 Diabetes with hypertension, hyperlipidemia and fatty liver disease:  History: Onset 5 years ago.  Previous intolerance to multiple statins. No hospitalizations for DM  Patient reports compliance with her medications. Taking medication(s): Metformin 1000 mg daily (reduced last visit), fenofibrate daily, losartan 100 mg daily.    Last eye exam: UTD, sees my eye doctor. Last foot exam: UTD Last A1c:  Lab Results  Component Value Date   HGBA1C 5.4 05/27/2020   Nephropathy screen indicated?: on ARB Last flu, zoster and/or pneumovax: Up-to-date Immunization History  Administered Date(s) Administered  . Fluad Quad(high Dose 65+) 10/30/2019  . Influenza, High Dose Seasonal PF 10/06/2018  . Influenza,inj,Quad PF,6+ Mos 09/28/2013, 09/27/2014, 10/15/2015, 11/03/2016  . Moderna SARS-COVID-2 Vaccination 02/26/2020, 03/25/2020  . Pneumococcal Conjugate-13 05/25/2018  . Pneumococcal Polysaccharide-23 10/30/2019  . Tdap 09/27/2014  . Zoster 01/31/2013  . Zoster Recombinat (Shingrix) 02/07/2019    ROS: Denies dizziness, LOC, polyuria, polydipsia, foot ulcerations, numbness or tingling in extremities, shortness of breath or chest pain.  2.  Depression/anxiety History: Longstanding history of anxiety and depression.  Patient has seen a counselor in the past but has no intention of seeing 1 again.  Previously failed Effexor.  Patient is treated with Lexapro 20 mg daily, Abilify 5 mg daily and amitriptyline 10 mg daily by previous PCP. She reports stability.  She saw neurology recently for memory loss/Parkinson-like symptoms and there was concern that Abilify may be contributing.  She has not yet started the taper.  She has a brain scan coming up soon.  She reports ongoing weight loss that is unplanned.  She reports good appetite denies any medic easy,  melena, night sweats, change in exercise tolerance, hemoptysis.  Her laboratory work-up thus far has been unremarkable.  ROS: Per HPI  Allergies  Allergen Reactions  . Statins Other (See Comments)    myalgias  . E-Mycin [Erythromycin] Other (See Comments)    Medication intolerance stomach cramps  . Estrogens Other (See Comments)    Estrogen replacement Therapy intolerance mood changes  . Sulfa Antibiotics Other (See Comments)    Causes severe stomach cramps   . Vioxx [Rofecoxib] Other (See Comments)    Unknown reaction/interaction. Possible stomach cramps   Past Medical History:  Diagnosis Date  . Adrenal adenoma    left side   . Allergic rhinitis   . 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 Anxiety and depression   . Asthmatic bronchitis   . Bilateral carpal tunnel syndrome   . Condyloma acuminata   . Diabetes mellitus without complication ([place])   . Diverticulosis   . Eczema    STABLE   . GERD (gastroesophageal reflux disease)   . H. pylori infection 11/16/1993  . Hiatal hernia   . History of benign adrenal tumor 2007  . History of benign adrenal tumor [facility]   [facility]   . History of pneumonia 2007, 2008  . Hyperlipidemia   . Hypertension   . IBS (irritable bowel syndrome)   . Internal hemorrhoids   . Kyphosis   . Memory loss   . Metabolic syndrome   . Otitis media, serous, acute   . Sleep apnea    uses CPAP  . Symptomatic menopausal or female climacteric states   . Vitamin D deficiency     Current Outpatient Medications:  .  amitriptyline (ELAVIL) 10 MG  tablet, TAKE 1 TABLET BY MOUTH AT  BEDTIME, Disp: 90 tablet, Rfl: 3 .  amLODipine (NORVASC) 5 MG tablet, TAKE 1 TABLET BY MOUTH  DAILY, Disp: 90 tablet, Rfl: 0 .  ARIPiprazole (ABILIFY) 5 MG tablet, TAKE 1 TABLET BY MOUTH  DAILY, Disp: 90 tablet, Rfl: 0 .  Ascorbic Acid (VITAMIN C) 1000 MG tablet, Take 1,000 mg by mouth daily., Disp: , Rfl:  .  aspirin EC 81 MG tablet, Take 1 tablet (81 mg total) by mouth daily., Disp: , Rfl:  .   Calcium Carb-Cholecalciferol (CALCIUM 600+D3) 600-800 MG-UNIT TABS, Take 2 tablets by mouth daily., Disp: , Rfl:  .  Cholecalciferol (VITAMIN D-3) 5000 UNITS TABS, Take 5,000 Units by mouth daily. , Disp: , Rfl:  .  escitalopram (LEXAPRO) 20 MG tablet, TAKE 1 TABLET BY MOUTH  DAILY, Disp: 90 tablet, Rfl: 0 .  fenofibrate 160 MG tablet, TAKE 1 TABLET BY MOUTH  DAILY, Disp: 90 tablet, Rfl: 0 .  fluticasone (FLONASE) 50 MCG/ACT nasal spray, Use 2 sprays in each  nostril daily, Disp: 48 g, Rfl: 2 .  losartan-hydrochlorothiazide (HYZAAR) 100-25 MG tablet, TAKE 1 TABLET BY MOUTH  EVERY DAY AS DIRECTED, Disp: 90 tablet, Rfl: 3 .  metFORMIN (GLUCOPHAGE) 1000 MG tablet, Take 1 tablet (1,000 mg total) by mouth daily with breakfast., Disp: 90 tablet, Rfl: 3 .  montelukast (SINGULAIR) 10 MG tablet, TAKE 1 TABLET BY MOUTH  DAILY, Disp: 90 tablet, Rfl: 3 .  mupirocin ointment (BACTROBAN) 2 %, Apply 1 application topically 2 (two) times daily., Disp: 22 g, Rfl: 3 .  omeprazole (PRILOSEC) 40 MG capsule, TAKE 1 CAPSULE BY MOUTH  DAILY, Disp: 90 capsule, Rfl: 0 .  PROAIR HFA 108 (90 BASE) MCG/ACT inhaler, Use 2 puffs into the lungs  every 6 hours as needed for wheezing or shortness of  breath, Disp: 25.5 g, Rfl: 2 .  SYMBICORT 160-4.5 MCG/ACT inhaler, Use 2 puffs two times daily, Disp: 30.6 g, Rfl: 2 .  valACYclovir (VALTREX) 1000 MG tablet, 2 po BID for 1 day at fever blister onset. May repeat if reoccurs, Disp: 12 tablet, Rfl: 0 Social History   Socioeconomic History  . Marital status: Married    Spouse name: Melissa Bowman  . Number of children: 2  . Years of education: some college  . Highest education level: Some college, no degree  Occupational History  . Occupation: retired    Comment: daycare  Tobacco Use  . Smoking status: Never Smoker  . Smokeless tobacco: Never Used  Vaping Use  . Vaping Use: Never used  Substance and Sexual Activity  . Alcohol use: No  . Drug use: No  . Sexual activity: Not on  file  Other Topics Concern  . Not on file  Social History Narrative   Melissa Bowman is retired. She lives with her husband Melissa Bowman. They have two grown daughters that live locally. Estha enjoys reading and puzzles.    Social Determinants of Health   Financial Resource Strain:   . Difficulty of Paying Living Expenses: Not on file  Food Insecurity:   . Worried About Charity fundraiser in the Last Year: Not on file  . Ran Out of Food in the Last Year: Not on file  Transportation Needs:   . Lack of Transportation (Medical): Not on file  . Lack of Transportation (Non-Medical): Not on file  Physical Activity:   . Days of Exercise per Week: Not on file  . Minutes of Exercise per Session:  Not on file  Stress:   . Feeling of Stress : Not on file  Social Connections:   . Frequency of Communication with Friends and Family: Not on file  . Frequency of Social Gatherings with Friends and Family: Not on file  . Attends Religious Services: Not on file  . Active Member of Clubs or Organizations: Not on file  . Attends Archivist Meetings: Not on file  . Marital Status: Not on file  Intimate Partner Violence:   . Fear of Current or Ex-Partner: Not on file  . Emotionally Abused: Not on file  . Physically Abused: Not on file  . Sexually Abused: Not on file   Family History  Problem Relation Age of Onset  . Parkinsonism Father   . Emphysema Father   . Hip fracture Father   . Heart disease Mother   . COPD Brother   . Hypertension Brother   . Heart disease Brother   . Heart failure Sister   . Ovarian cancer Sister   . COPD Sister   . Lymphoma Sister   . COPD Sister   . COPD Brother     Objective: Office vital signs reviewed. BP 121/77   Pulse (!) 57   Temp 98.4 F (36.9 C) (Temporal)   Ht 5' (1.524 m)   Wt 170 lb (77.1 kg)   SpO2 100%   BMI 33.20 kg/m   Physical Examination:  General: Awake, alert, No acute distress HEENT: Normal.  Sclera white.   Cardio: regular rate  and rhythm, S1S2 heard.  Soft systolic murmur noted at the left sternal border. Pulm: clear to auscultation bilaterally, no wheezes, rhonchi or rales; normal work of breathing on room air Extremities: warm, well perfused, No edema, cyanosis or clubbing; +2 pulses bilaterally Psych: Mood stable, speech normal, affect flat.   Depression screen [facility] 2/9 09/01/2020 05/27/2020 03/04/2020  Decreased Interest 2 0 0  Down, Depressed, Hopeless 1 0 0  PHQ - 2 Score 3 0 0  Altered sleeping 0 0 -  Tired, decreased energy 0 0 -  Change in appetite 0 0 -  Feeling bad or failure about yourself  1 0 -  Trouble concentrating 1 0 -  Moving slowly or fidgety/restless 1 0 -  Suicidal thoughts 0 0 -  PHQ-9 Score 6 0 -   GAD 7 : Generalized Anxiety Score 09/01/2020 05/27/2020 10/30/2019  Nervous, Anxious, on Edge 0 0 0  Control/stop worrying 3 0 1  Worry too much - different things 3 0 2  Trouble relaxing 0 0 0  Restless 0 0 0  Easily annoyed or irritable 1 0 0  Afraid - awful might happen 1 0 0  Total GAD 7 Score 8 0 3  Anxiety Difficulty - - Not difficult at all     Assessment/ Plan: 69 y.o. female   1. Type 2 diabetes mellitus with other specified complication, without long-term current use of insulin (HCC) Continues to be under excellent control with A1c of 5.4 today.  I am having her stop the Metformin, particularly given unplanned weight loss as below. - Bayer DCA Hb A1c Waived  2. Hyperlipidemia associated with type 2 diabetes mellitus (HCC) Continue fenofibrate  3. Hypertension associated with diabetes ([place]) Stable.  Continue current regimen  4. Unintended weight loss Uncertain etiology.  Laboratory work-up thus far has been negative.  Referral back to gastroenterology to rule out colonic malignancy - Ambulatory referral to Gastroenterology  5. Anxiety and depression Reports stability  though scores have gone up some since last visit.  I am going to reduce her Abilify to 2.5 mg daily for 4  weeks and then she may stop the medication.  I consulted with pharmacy to make sure that this was appropriate duration of taper.    No orders of the defined types were placed in this encounter.  No orders of the defined types were placed in this encounter.    Melissa Norlander, Melissa [place] [phone]

## 2020-09-02 DIAGNOSIS — Z23 Encounter for immunization: Secondary | ICD-10-CM | POA: Diagnosis not present

## 2020-09-02 DIAGNOSIS — E1169 Type 2 diabetes mellitus with other specified complication: Secondary | ICD-10-CM | POA: Diagnosis not present

## 2020-09-04 ENCOUNTER — Telehealth: Payer: Self-pay | Admitting: Neurology

## 2020-09-04 ENCOUNTER — Encounter: Payer: Self-pay | Admitting: Neurology

## 2020-09-04 NOTE — Telephone Encounter (Signed)
Please see my chart message for reference.  Since she is in the process of weaning completely off of Abilify, I wonder if it is possible to schedule her DaTscan after she is off of the Abilify, maybe a week after?  Please check with her husband if the scheduled appointment for next week can be moved out a little bit.  If it is hard to come by the appointment then we will leave it in place.

## 2020-09-04 NOTE — Telephone Encounter (Signed)
DAT scan canceled for 09/09/2020 because of weaning of Abilify . I will Get Patient rescheduled for around the 25 th of October. I will reauthorize DAT Scan . Zacarias Pontes will Call You Mr. Thomason for October date . I hope this helps. Thanks Hinton Dyer

## 2020-09-04 NOTE — Telephone Encounter (Signed)
Called patient's apt will be CX . For DAT scan . Will you let me know a good date to get her rescheduled. Thanks Hinton Dyer

## 2020-09-09 ENCOUNTER — Encounter (HOSPITAL_COMMUNITY): Payer: Medicare Other

## 2020-09-09 ENCOUNTER — Encounter (HOSPITAL_COMMUNITY): Payer: Self-pay

## 2020-09-23 ENCOUNTER — Other Ambulatory Visit: Payer: Self-pay | Admitting: Family Medicine

## 2020-09-27 ENCOUNTER — Other Ambulatory Visit: Payer: Self-pay | Admitting: Family Medicine

## 2020-10-05 ENCOUNTER — Encounter: Payer: Self-pay | Admitting: Neurology

## 2020-10-09 ENCOUNTER — Other Ambulatory Visit: Payer: Self-pay | Admitting: Family Medicine

## 2020-10-14 ENCOUNTER — Encounter: Payer: Self-pay | Admitting: Neurology

## 2020-11-05 ENCOUNTER — Other Ambulatory Visit: Payer: Self-pay | Admitting: Family Medicine

## 2020-11-10 ENCOUNTER — Ambulatory Visit: Payer: Medicare Other | Admitting: Neurology

## 2020-11-11 ENCOUNTER — Ambulatory Visit: Payer: Medicare Other | Admitting: Neurology

## 2020-11-13 ENCOUNTER — Other Ambulatory Visit: Payer: Self-pay

## 2020-11-13 ENCOUNTER — Encounter (HOSPITAL_COMMUNITY)
Admission: RE | Admit: 2020-11-13 | Discharge: 2020-11-13 | Disposition: A | Discharge status: Home / Self Care | Payer: Medicare Other | Source: Ambulatory Visit | Attending: Neurology | Admitting: Neurology

## 2020-11-13 ENCOUNTER — Ambulatory Visit (HOSPITAL_COMMUNITY)
Admission: RE | Admit: 2020-11-13 | Discharge: 2020-11-13 | Disposition: A | Discharge status: Home / Self Care | Payer: Medicare Other | Source: Ambulatory Visit | Attending: Neurology | Admitting: Neurology

## 2020-11-13 DIAGNOSIS — R413 Other amnesia: Secondary | ICD-10-CM

## 2020-11-13 DIAGNOSIS — R410 Disorientation, unspecified: Secondary | ICD-10-CM

## 2020-11-13 DIAGNOSIS — G20 Parkinson's disease: Secondary | ICD-10-CM | POA: Diagnosis not present

## 2020-11-13 DIAGNOSIS — F39 Unspecified mood [affective] disorder: Secondary | ICD-10-CM | POA: Diagnosis present

## 2020-11-13 DIAGNOSIS — G20C Parkinsonism, unspecified: Secondary | ICD-10-CM

## 2020-11-13 DIAGNOSIS — R2689 Other abnormalities of gait and mobility: Secondary | ICD-10-CM | POA: Diagnosis not present

## 2020-11-13 MED ORDER — POTASSIUM IODIDE (ANTIDOTE) 130 MG PO TABS
ORAL_TABLET | ORAL | Status: AC
  Administered 2020-11-13: 130 mg via ORAL
  Filled 2020-11-13: qty 1

## 2020-11-13 MED ORDER — POTASSIUM IODIDE (ANTIDOTE) 130 MG PO TABS: 130.0000 mg | ORAL_TABLET | Freq: Once | ORAL | Status: AC

## 2020-11-13 MED ORDER — IOFLUPANE I 123 185 MBQ/2.5ML IV SOLN
4.7000 | Freq: Once | INTRAVENOUS | Status: AC | PRN
  Administered 2020-11-13: 4.7 via INTRAVENOUS
  Filled 2020-11-13: qty 5

## 2020-11-14 NOTE — Progress Notes (Signed)
Please call patient or her family member on DPR regarding her 57.  Findings are not suggestive of an underlying parkinsonian syndrome and scan was reported as normal, which is very reassuring.  We can continue to monitor her symptoms and examination.  She can keep her appointment as scheduled.

## 2020-11-17 ENCOUNTER — Telehealth: Payer: Self-pay

## 2020-11-17 NOTE — Telephone Encounter (Signed)
-----   Message from Star Age, MD sent at 11/14/2020  9:09 AM EST ----- Please call patient or her family member on DPR regarding her 57.  Findings are not suggestive of an underlying parkinsonian syndrome and scan was reported as normal, which is very reassuring.  We can continue to monitor her symptoms and examination.  She can keep her appointment as scheduled.

## 2020-11-17 NOTE — Telephone Encounter (Signed)
I called pt. Spoke to husband, [place], per [employer]. I advised him of pt's normal DatScan results. Pt will keep the appt on 11/20/2020. Pt's husband verbalized understanding of results and had no questions or concerns.

## 2020-11-19 ENCOUNTER — Other Ambulatory Visit: Payer: Self-pay | Admitting: Family Medicine

## 2020-11-20 ENCOUNTER — Encounter: Payer: Self-pay | Admitting: Neurology

## 2020-11-20 ENCOUNTER — Ambulatory Visit: Payer: Medicare Other | Admitting: Neurology

## 2020-11-20 VITALS — BP 136/69 | HR 57 | Ht 59.0 in | Wt 174.5 lb

## 2020-11-20 DIAGNOSIS — R410 Disorientation, unspecified: Secondary | ICD-10-CM | POA: Diagnosis not present

## 2020-11-20 DIAGNOSIS — G20 Parkinson's disease: Secondary | ICD-10-CM | POA: Diagnosis not present

## 2020-11-20 DIAGNOSIS — R413 Other amnesia: Secondary | ICD-10-CM

## 2020-11-20 DIAGNOSIS — Z9189 Other specified personal risk factors, not elsewhere classified: Secondary | ICD-10-CM | POA: Diagnosis not present

## 2020-11-20 DIAGNOSIS — R6889 Other general symptoms and signs: Secondary | ICD-10-CM

## 2020-11-20 NOTE — Patient Instructions (Signed)
We will continue to monitor your exam. I think it would help if you used hearing aids, it would help your cognitive function and help you communicate better.  Please talk to Dr. Lajuana Ripple about going back to Abilify 5 mg daily as you felt better on it.  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 For your episode of confusion back in April of this year and risk factors for stroke, I would like to proceed with a brain MRI without contrast, you have not had a brain MRI yet.  Please continue using your CPAP, if you need reevaluation for sleep apnea and to qualify for a new CPAP machine through your insurance, I would be happy to see you for this as well. For now, please follow-up in 6 months in this office, sooner if needed.

## 2020-11-20 NOTE — Progress Notes (Signed)
Subjective:    Patient ID: Melissa Bowman is a 69 y.o. female.  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  The patient is accompanied by her daughter today.  I first met her at the request of her primary care physician on 08/06/2020, at which time she had a several month history of difficulty with her balance and mobility, changes in her posture and her gait.  She had evidence of parkinsonism without telltale lateralization.  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  She was on Abilify at the time. I suggested we proceed with a DaTscan.  We delayed the DaTscan until she was off of Abilify as she was weaned off of it.    She had a DaT scan on 11/13/20 and I reviewed the results: IMPRESSION: Normal DATscan. Findings are NOT suggestive of Parkinson's syndrome Pathology  They were notified of the test results by phone call.  Today, 11/20/20: She reports feeling the same, not much different, certainly not worse. Per daughter, her trembling and mobility may be a little bit better. She was never actually completely off of the Abilify and reduce it to 2.5 mg daily but felt better on 5 mg daily. Daughter reports that patient's husband has also noted decline in the mood since the Abilify was reduced. The patient has hearing aids but never liked using them and does not typically use them now. She is compliant with her CPAP but has not had reevaluation in years, she purchased a CPAP machine or AutoPap machine out-of-pocket several years ago, she had a gap in her insurance at the time and needed a  new machine. Sleep testing was probably 15 or 20 years ago. Memory is more or less stable, she is forgetful. Her daughter is concerned that she may have had a lacunar stroke before. Back in April, around Easter, in fact when they had a family gathering and were taking pictures for Easter, patient had an episode of not being fully attentive, not acting her typical self, no obvious one-sided weakness or numbness or slurring of speech or droopy face at the time. She has not had a recent brain MRI.  The patient's allergies, current medications, family history, past medical history, past social history, past surgical history and problem list were reviewed and updated as appropriate.   Previously:   08/06/20: (She) reports very little of her own history, primarily due to hearing loss. Her history is primarily provided by her husband, who reports that since approximately April of this year the patient has had more difficulty with her balance and mobility.  She has had changes in her mood.  He reports that she has a longstanding history of depression and anxiety and has tried different medications.  She was on Effexor for years and recently she was placed on Abilify and Lexapro.  This was around March 2021.  She has had changes in her posture and her gait, she has been noted to be slower in her movements.  This has also been noted by her daughter who is a nurse.  She endorses stress, particularly with regards to her other daughter.  Both daughters live close by.  She has a total of   5 grandchildren and has been taking care of 2 grandchildren ages 5 and 3 but the 5-year-old recently started school which has improved her stress level she indicates.  She has not fallen recently but has fallen in the past.  She is the youngest of a total of 16 children and most of her siblings passed away, she denies any history of tremor in her siblings or memory loss but reports that her father had Parkinson's disease and died at age 64  and her brother had Parkinson's disease and Alzheimer's dementia and died in his 70s, probably around 74.  She has now 3 sisters alive and one more brother alive.  She is a non-smoker and drinks no alcohol, she likes to drink coffee and drinks about 2 to 3 cups/day.  She has had unintended weight loss since approximately April of this year and had some work-up for this.  She denies any loss of appetite.  She has hearing aids but does not wear them.  She had surgery for sleep apnea in 1997 and also uses a CPAP machine.  She reports trouble with her right shoulder and had 3 rotator cuff surgeries.  She has had some memory loss including forgetfulness.  She has been sleeping fairly well but does have a history of dream enactment behavior for the past 1+ year.  Within the past year she fell out of bed twice, she reports that she rolled out of bed and did not injure herself and went back to bed.  She takes melatonin for sleep.   She had a head CT and cervical spine CT without contrast after a fall on 11/17/2017 reviewed the results: IMPRESSION: Left frontoparietal scalp contusion.   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 No acute intracranial abnormality.   No acute cervical spine fracture or posttraumatic listhesis.   Her Past Medical History Is Significant For: Past Medical History:  Diagnosis Date  . Adrenal adenoma    left side   . Allergic rhinitis   . 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 Anxiety and depression   . Asthmatic bronchitis   . Bilateral carpal tunnel syndrome   . Condyloma acuminata   . Diabetes mellitus without complication (HCC)   . Diverticulosis   . Eczema    STABLE   . GERD (gastroesophageal reflux disease)   . H. pylori infection 11/16/1993  . Hiatal hernia   . History of benign adrenal tumor 2007  . History of benign adrenal tumor [facility]   [facility]   . History of pneumonia 2007, 2008  . Hyperlipidemia   . Hypertension   . IBS (irritable bowel syndrome)   .  Internal hemorrhoids   . Kyphosis   . Memory loss   . Metabolic syndrome   . Otitis media, serous, acute   . Sleep apnea    uses CPAP  . Symptomatic menopausal or female climacteric states   . Vitamin D deficiency     Her Past Surgical History Is Significant For: Past Surgical History:  Procedure Laterality Date  . abdominal wall lematoma of fabdd hys.    . APPENDECTOMY    . CARPAL TUNNEL RELEASE    . CESAREAN SECTION     X2  . INCISIONAL HERNIA REPAIR    . right breast biopsy  10/96  . ROTATOR CUFF REPAIR  2002   right  . ROTATOR CUFF REPAIR/ bone spur  08/30/09  . SHOULDER ARTHROSCOPY WITH ROTATOR CUFF REPAIR AND SUBACROMIAL DECOMPRESSION Right 01/19/2018     Procedure: REVISION RIGHT MINI OPEN ROTATOR CUFF REPAIR AND SUBACROMIAL DECOMPRESSION;  Surgeon: Beane, Jeffrey, MD;  Location: [facility];  Service: Orthopedics;  Laterality: Right;  90 MINS  . sleep apnea surgery  5/96  . torn cartiledge rt knee x2  05/13/06   Dr. Lucey Smoc   . TOTAL ABDOMINAL HYSTERECTOMY W/ BILATERAL SALPINGOOPHORECTOMY  8/94   fibroids     Her Family History Is Significant For: Family History  Problem Relation Age of Onset  . Parkinsonism Father   . Emphysema Father   . Hip fracture Father   . Heart disease Mother   . COPD Brother   . Hypertension Brother   . Heart disease Brother   . Heart failure Sister   . Ovarian cancer Sister   . COPD Sister   . Lymphoma Sister   . COPD Sister   . COPD Brother     Her Social History Is Significant For: Social History   Socioeconomic History  . Marital status: Married    Spouse name: [name]  . Number of children: 2  . Years of education: some college  . Highest education level: Some college, no degree  Occupational History  . Occupation: retired    Comment: daycare  Tobacco Use  . Smoking status: Never Smoker  . Smokeless tobacco: Never Used  Vaping Use  . Vaping Use: Never used  Substance and Sexual Activity  . Alcohol use: No  . Drug use:  No  . Sexual activity: Not on file  Other Topics Concern  . Not on file  Social History Narrative   Kaydan is retired. She lives with her husband [name]. They have two grown daughters that live locally. Jnaya enjoys reading and puzzles.    Social Determinants of Health   Financial Resource Strain: Not on file  Food Insecurity: Not on file  Transportation Needs: Not on file  Physical Activity: Not on file  Stress: Not on file  Social Connections: Not on file    Her Allergies Are:  Allergies  Allergen Reactions  . Statins Other (See Comments)    myalgias  . E-Mycin [Erythromycin] Other (See Comments)    Medication intolerance stomach cramps  . Estrogens Other (See Comments)    Estrogen replacement Therapy intolerance mood changes  . Sulfa Antibiotics Other (See Comments)    Causes severe stomach cramps   . Vioxx [Rofecoxib] Other (See Comments)    Unknown reaction/interaction. Possible stomach cramps  :   Her Current Medications Are:  Outpatient Encounter Medications as of 11/20/2020  Medication Sig  . amitriptyline (ELAVIL) 10 MG tablet TAKE 1 TABLET BY MOUTH AT  BEDTIME  . amLODipine (NORVASC) 5 MG tablet TAKE 1 TABLET BY MOUTH  DAILY  . ARIPiprazole (ABILIFY) 5 MG tablet TAKE 1 TABLET BY MOUTH  DAILY  . Ascorbic Acid (VITAMIN C) 1000 MG tablet Take 1,000 mg by mouth daily.  . aspirin EC 81 MG tablet Take 1 tablet (81 mg total) by mouth daily.  . Calcium Carb-Cholecalciferol 600-800 MG-UNIT TABS Take 2 tablets by mouth daily.  . Cholecalciferol (VITAMIN D-3) 5000 UNITS TABS Take 5,000 Units by mouth daily.  . escitalopram (LEXAPRO) 20 MG tablet TAKE 1 TABLET BY MOUTH  DAILY  . fenofibrate 160 MG tablet TAKE 1 TABLET BY MOUTH  DAILY  . fluticasone (FLONASE) 50 MCG/ACT nasal spray Use 2 sprays in each  nostril daily  . losartan-hydrochlorothiazide (HYZAAR) 100-25 MG tablet TAKE 1 TABLET BY MOUTH  DAILY AS DIRECTED  .   montelukast (SINGULAIR) 10 MG tablet TAKE 1 TABLET BY  MOUTH  DAILY  . mupirocin ointment (BACTROBAN) 2 % Apply 1 application topically 2 (two) times daily.  . omeprazole (PRILOSEC) 40 MG capsule TAKE 1 CAPSULE BY MOUTH  DAILY  . PROAIR HFA 108 (90 BASE) MCG/ACT inhaler Use 2 puffs into the lungs  every 6 hours as needed for wheezing or shortness of  breath  . SYMBICORT 160-4.5 MCG/ACT inhaler Use 2 puffs two times daily  . valACYclovir (VALTREX) 1000 MG tablet 2 po BID for 1 day at fever blister onset. May repeat if reoccurs  . metFORMIN (GLUCOPHAGE) 1000 MG tablet TAKE 1 TABLET BY MOUTH  TWICE DAILY WITH A MEAL (Patient not taking: Reported on 11/20/2020)   No facility-administered encounter medications on file as of 11/20/2020.  :  Review of Systems:  Out of a complete 14 point review of systems, all are reviewed and negative with the exception of these symptoms as listed below: Review of Systems  Neurological:       Patient here with daughter, Melissa Bowman for FU post DAT scan. Tapering off Abilify, but prefers to stay on it because she feels better.    Objective:  Neurological Exam  Physical Exam Physical Examination:   Vitals:   11/20/20 0904  BP: 136/69  Pulse: (!) 57    General Examination: The patient is a very pleasant 69 y.o. female in no acute distress. She appears well-developed and well-nourished and well groomed. More interactive today.  HEENT: Normocephalic, atraumatic, pupils are equal, round and reactive to light. Extraocular tracking is mildly impaired with saccadic breakdown of smooth pursuit.  She has mild facial masking.  She has mild nuchal rigidity.  She has a lower lip and jaw tremor, seems stable.  She has mild hypophonia.  She is rather hard of hearing, no hearing aids in place.  Speech is otherwise clear, but scant, no dysarthria, airway examination reveals mild mouth dryness, status post UPPP.  Tongue protrudes centrally in palate elevates symmetrically. Facial sensation is intact.  No carotid bruits.  Chest:  Clear to auscultation without wheezing, rhonchi or crackles noted.  Heart: S1+S2+0, regular and normal without murmurs, rubs or gallops noted.   Abdomen: Soft, non-tender and non-distended with normal bowel sounds appreciated on auscultation.  Extremities: There is no pitting edema in the distal lower extremities bilaterally.  Skin: Warm and dry without trophic changes noted.  Musculoskeletal: exam reveals decreased mobility and right shoulder.   Neurologically:  Mental status: The patient is awake, alert and oriented in all 4 spheres. Her immediate and remote memory, attention, language skills and fund of knowledge are mildly impaired, she has some bradyphrenia, but she is also quite hard of hearing.  Mood is constricted and affect is blunted.  Cranial nerves II - XII are as described above under HEENT exam.  Motor exam: Normal bulk, tone is mildly increased in the upper extremities, no cogwheeling noted in the right upper extremity today.  She has no significant resting tremor or postural or action tremor in both upper extremities.  There is no drift or rebound. Reflexes are 1+.  Romberg is not tested for safety concerns.  On fine motor testing, she has mild impairment with finger taps, hand movements, and rapid alternating patting with both hands.  In the lower extremities with foot taps, she has mild impairment bilaterally, overall, no significant lateralization noted.  She has overall mild slowness in her movements, no dyskinesias.  Cerebellar testing: No dysmetria or   intention tremor on finger to nose testing. There is no truncal or gait ataxia.  Sensory exam: intact to light touch in the upper and lower extremities. Gait, station and balance: She stands up with mild difficulty and pushes herself up.  Her posture is mildly stooped for age, seems a little better, she walks slowly and has decreased swing bilaterally, however, overall, compared to last time, gait, posture and turns seem a  little better today  Assessment and Plan:   In summary, Melissa Bowman is a very pleasant 69 year old female with an underlying medical history of vitamin D deficiency, hypertension, hyperlipidemia, irritable bowel syndrome, eczema, reflux disease, diverticulosis, diabetes, anxiety, depression, allergic rhinitis, adrenal tumor, sleep apnea and obesity, who presents for evaluation of her gait disorder, cognitive complaints, changes in posture and fine motor dyscontrol. She had symptoms ongoing for several months. They were more noticeable since April 2021. She had a lip quiver before she started the Abilify. A DaTscan in November 2021 was reported as normal. Exam is improved slightly. She certainly has not become worse. 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 She is on a lower dose of Abilify at this time, 2.5 mg daily but felt better on 5 mg daily and her husband also noted a decline in her mood. The patient and her daughter are advised to talk to the primary care physician about maintaining Abilify at 5 mg if she felt better. We will continue to monitor her exam.  I do believe she would benefit from having her hearing rechecked and readjusted for her hearing aids. It would help her cognitive function and help her communicate better. She is encouraged to think about it. In addition, we mutually agreed to proceed with a brain MRI. Her daughter is concerned that she may have had some vascular event back in April when she had an episode of not being fully attentive when they were all gathered for Easter. She did not have any sudden onset of one-sided weakness or numbness or tingling or droopy face or slurring of speech. Nevertheless, a brain MRI seems reasonable. I will order an MRI brain without contrast and we will call them with the results. For now, I suggested a follow-up for recheck in 6 months, sooner if needed. Also, we talked about her sleep apnea. She has been  on CPAP therapy for years, has purchased another machine some years ago but had testing many years ago, probably over 15 years ago per daughter's report. I would be happy to see her for sleep apnea if the need arises and if the primary care physician requests for me to handle her sleep apnea. We will await input from her primary care for this. She reports full compliance with her CPAP machine. She may even be eligible for a new machine through her insurance at this time. I answered all the questions today and the patient and her daughter were in agreement with the plan.  I spent 30 minutes in total face-to-face time and in reviewing records during pre-charting, more than 50% of which was spent in counseling and coordination of care, reviewing test results, reviewing medications and treatment regimen and/or in discussing or reviewing the diagnosis of parkinsonism, the prognosis and treatment options. Pertinent laboratory and imaging test results that were available during this visit with the patient were reviewed by me and considered in my medical decision making (see chart for details).

## 2020-11-24 ENCOUNTER — Telehealth: Payer: Self-pay | Admitting: Neurology

## 2020-11-24 NOTE — Telephone Encounter (Signed)
[employer] medicare order sent to [facility]. No auth they will reach out to the patient to schedule.  

## 2020-11-29 ENCOUNTER — Other Ambulatory Visit: Payer: Self-pay | Admitting: Family Medicine

## 2020-11-30 ENCOUNTER — Ambulatory Visit
Admission: RE | Admit: 2020-11-30 | Discharge: 2020-11-30 | Disposition: A | Discharge status: Home / Self Care | Payer: Medicare Other | Source: Ambulatory Visit | Attending: Neurology | Admitting: Neurology

## 2020-11-30 DIAGNOSIS — Z9189 Other specified personal risk factors, not elsewhere classified: Secondary | ICD-10-CM

## 2020-11-30 DIAGNOSIS — G20 Parkinson's disease: Secondary | ICD-10-CM | POA: Diagnosis not present

## 2020-11-30 DIAGNOSIS — R410 Disorientation, unspecified: Secondary | ICD-10-CM

## 2020-11-30 DIAGNOSIS — R413 Other amnesia: Secondary | ICD-10-CM | POA: Diagnosis not present

## 2020-11-30 DIAGNOSIS — R6889 Other general symptoms and signs: Secondary | ICD-10-CM

## 2020-12-01 ENCOUNTER — Telehealth: Payer: Self-pay

## 2020-12-01 ENCOUNTER — Ambulatory Visit: Payer: Medicare Other | Admitting: Family Medicine

## 2020-12-01 NOTE — Telephone Encounter (Signed)
I called pt, spoke to pt's husband, [name], per DPR. I advised him of pt's MRI results. Pt's husband verbalized understanding of results and had no further questions at this time.

## 2020-12-01 NOTE — Progress Notes (Signed)
Please call patient or family member per DPR regarding her brain scan results: MRI brain without contrast showed no acute findings, chronic changes were seen. Compared to the head CT scan from 2018 there were no significant changes, which is reassuring.

## 2020-12-01 NOTE — Telephone Encounter (Signed)
-----   Message from Star Age, MD sent at 12/01/2020  9:39 AM EST ----- Please call patient or family member per [facility] regarding her brain scan results: MRI brain without contrast showed no acute findings, chronic changes were seen. Compared to the head CT scan from 2018 there were no significant changes, which is reassuring.

## 2020-12-08 ENCOUNTER — Other Ambulatory Visit: Payer: Self-pay | Admitting: Family Medicine

## 2020-12-24 ENCOUNTER — Ambulatory Visit: Payer: Medicare Other | Admitting: Internal Medicine

## 2020-12-24 ENCOUNTER — Ambulatory Visit: Payer: Medicare Other | Admitting: Gastroenterology

## 2021-01-02 ENCOUNTER — Other Ambulatory Visit: Payer: Self-pay | Admitting: *Deleted

## 2021-01-02 ENCOUNTER — Other Ambulatory Visit: Payer: Self-pay | Admitting: Family Medicine

## 2021-01-02 ENCOUNTER — Ambulatory Visit (INDEPENDENT_AMBULATORY_CARE_PROVIDER_SITE_OTHER): Payer: Medicare Other | Admitting: Nurse Practitioner

## 2021-01-02 ENCOUNTER — Encounter: Payer: Self-pay | Admitting: Nurse Practitioner

## 2021-01-02 DIAGNOSIS — R0981 Nasal congestion: Secondary | ICD-10-CM

## 2021-01-02 DIAGNOSIS — Z20822 Contact with and (suspected) exposure to covid-19: Secondary | ICD-10-CM

## 2021-01-02 DIAGNOSIS — R059 Cough, unspecified: Secondary | ICD-10-CM

## 2021-01-02 MED ORDER — PROMETHAZINE-DM 6.25-15 MG/5ML PO SYRP: 5.0000 mL | ORAL_SOLUTION | Freq: Four times a day (QID) | ORAL | 0 refills | Status: DC | PRN

## 2021-01-02 NOTE — Progress Notes (Signed)
Virtual Visit via telephone Note Due to COVID-19 pandemic this visit was conducted virtually. This visit type was conducted due to national recommendations for restrictions regarding the COVID-19 Pandemic (e.g. social distancing, sheltering in place) in an effort to limit this patient's exposure and mitigate transmission in our community. All issues noted in this document were discussed and addressed.  A physical exam was not performed with this format.  I connected with Melissa Bowman on 01/02/21 at 11:40 by telephone and verified that I am speaking with the correct person using two identifiers. Melissa Bowman is currently located at home and her husband is currently with her during visit. The provider, Mary-Margaret Hassell Done, FNP is located in their office at time of visit.  I discussed the limitations, risks, security and privacy concerns of performing an evaluation and management service by telephone and the availability of in person appointments. I also discussed with the patient that there may be a patient responsible charge related to this service. The patient expressed understanding and agreed to proceed.   History and Present Illness:   Chief Complaint: URI   HPI Patient calls in c/o of cough and congestion that started Wednesday. She developed a fever 100.5 and body aches developed yesterday. Her husband had covid last week. She has had vaccines.    Review of Systems  Constitutional: Positive for chills and fever.  HENT: Positive for congestion and sore throat. Negative for sinus pain.   Respiratory: Positive for cough and sputum production. Negative for shortness of breath.   Musculoskeletal: Positive for myalgias.  Neurological: Positive for dizziness and headaches.  All other systems reviewed and are negative.    Observations/Objective: Alert and oriented- answers all questions appropriately No distress Voice hoarse  * patient wanted her husband to speak for  her   Assessment and Plan: Melissa Bowman in today with chief complaint of URI   1. Cough 1. Take meds as prescribed 2. Use a cool mist humidifier especially during the winter months and when heat has been humid. 3. Use saline nose sprays frequently 4. Saline irrigations of the nose can be very helpful if done frequently.  * 4X daily for 1 week*  * Use of a nettie pot can be helpful with this. Follow directions with this* 5. Drink plenty of fluids 6. Keep thermostat turn down low 7.For any cough or congestion  Use plain Mucinex- regular strength or max strength is fine   * Children- consult with Pharmacist for dosing 8. For fever or aces or pains- take tylenol or ibuprofen appropriate for age and weight.  * for fevers greater than 101 orally you may alternate ibuprofen and tylenol every  3 hours.    - promethazine-dextromethorphan (PROMETHAZINE-DM) 6.25-15 MG/5ML syrup; Take 5 mLs by mouth 4 (four) times daily as needed for cough.  Dispense: 180 mL; Refill: 0  2. Congestion of nasal sinus  3. Suspected COVID-19 virus infection Labs pending TO ED if SOB worsens - Novel Coronavirus, NAA ([facility]); Future    Follow Up Instructions: prn    I discussed the assessment and treatment plan with the patient. The patient was provided an opportunity to ask questions and all were answered. The patient agreed with the plan and demonstrated an understanding of the instructions.   The patient was advised to call back or seek an in-person evaluation if the symptoms worsen or if the condition fails to improve as anticipated.  The above assessment and management plan was discussed with the  patient. The patient verbalized understanding of and has agreed to the management plan. Patient is aware to call the clinic if symptoms persist or worsen. Patient is aware when to return to the clinic for a follow-up visit. Patient educated on when it is appropriate to go to the emergency department.    Time call ended:  11:54 I provided 14 minutes of non-face-to-face time during this encounter.    Mary-Margaret Hassell Done, FNP

## 2021-01-04 LAB — SARS-COV-2, NAA 2 DAY TAT

## 2021-01-04 LAB — NOVEL CORONAVIRUS, NAA: SARS-CoV-2, NAA: DETECTED — AB

## 2021-01-05 ENCOUNTER — Telehealth: Payer: Self-pay

## 2021-01-05 ENCOUNTER — Telehealth (HOSPITAL_COMMUNITY): Payer: Self-pay | Admitting: Family

## 2021-01-05 NOTE — Telephone Encounter (Signed)
Called to discuss with Melissa Bowman about Covid symptoms and the use of antiviral therapy,  for those with mild to moderate Covid symptoms and at a high risk of hospitalization.     Pt is qualified for antiviral therapy due to co-morbid conditions and/or a member of an at-risk group. Spoke with husband via phone as patient is asleep. He states she is feeling better today, and declines therapy at this time. Symptoms tier reviewed as well as criteria for ending isolation.  Symptoms reviewed that would warrant ED/Hospital evaluation.    Patient Active Problem List   Diagnosis Date Noted  . Hyperlipidemia associated with type 2 diabetes mellitus ([place]) 10/30/2019  . Osteoarthritis of multiple joints 04/02/2019  . Right rotator cuff tear 01/19/2018  . Osteopenia 12/26/2013  . Type 2 diabetes mellitus ([place]) 10/31/2013  . Hypertension associated with diabetes ([place]) 05/14/2013  . Allergic rhinitis 05/14/2013  . Vitamin D deficiency 05/14/2013  . Hemorrhage of rectum and anus 09/15/2011  . Dysphagia, pharyngoesophageal phase 09/15/2011  . Chronic RUQ pain 09/15/2011  . Diverticulosis of colon (without mention of hemorrhage) 09/15/2011  . BRBPR (bright red blood per rectum) 08/27/2011  . Internal hemorrhoids without mention of complication 07/37/1062  . Esophageal reflux 08/27/2011  . Abdominal pain 08/27/2011  . Esophageal dysphagia 08/27/2011  . Obesity 08/27/2011  . Anxiety and depression 08/27/2011  . 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

## 2021-01-06 ENCOUNTER — Ambulatory Visit (INDEPENDENT_AMBULATORY_CARE_PROVIDER_SITE_OTHER): Payer: Medicare Other | Admitting: Family Medicine

## 2021-01-06 ENCOUNTER — Encounter: Payer: Self-pay | Admitting: Family Medicine

## 2021-01-06 DIAGNOSIS — G4730 Sleep apnea, unspecified: Secondary | ICD-10-CM

## 2021-01-06 DIAGNOSIS — E1169 Type 2 diabetes mellitus with other specified complication: Secondary | ICD-10-CM

## 2021-01-06 DIAGNOSIS — U071 COVID-19: Secondary | ICD-10-CM

## 2021-01-06 NOTE — Progress Notes (Signed)
Telephone visit  Subjective: CC: mood/ COVID19 infection PCP: Janora Norlander, DO [id number] Melissa Bowman is a 70 y.o. female calls for telephone consult today. Patient provides verbal consent for consult held via phone.  Due to COVID-19 pandemic this visit was conducted virtually. This visit type was conducted due to national recommendations for restrictions regarding the COVID-19 Pandemic (e.g. social distancing, sheltering in place) in an effort to limit this patient's exposure and mitigate transmission in our community. All issues noted in this document were discussed and addressed.  A physical exam was not performed with this format.   Location of patient: home Location of provider: [facility] Others present for call: spouse, grandchild  82. Mood Patient reports that she has been back on the Abilify 5 mg daily.  She seems stable with this.  She saw her neurologist back in December and made them aware of having resumed Abilify.  She apparently will be closely followed up by her neurologist for ongoing issues but thus far eval for Parkinson's has been negative and MRI was unremarkable.  She has not had a new CPAP machine in a while and would be willing to undergo repeat sleep study.     2. COVID-19 infection  Fever resolved.  She refused COVID antiviral treatment.  She reports mild cough at bedtime.  No myalgia, shortness of breath, diarrhea, vomiting.  She is drinking plenty of fluids.  She is taking Tylenol/ Advil and uses cough syrup at night.   ROS: Per HPI  Allergies  Allergen Reactions  . Statins Other (See Comments)    myalgias  . E-Mycin [Erythromycin] Other (See Comments)    Medication intolerance stomach cramps  . Estrogens Other (See Comments)    Estrogen replacement Therapy intolerance mood changes  . Sulfa Antibiotics Other (See Comments)    Causes severe stomach cramps   . Vioxx [Rofecoxib] Other (See Comments)    Unknown reaction/interaction. Possible stomach cramps    Past Medical History:  Diagnosis Date  . Adrenal adenoma    left side   . Allergic rhinitis   . 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 Anxiety and depression   . Asthmatic bronchitis   . Bilateral carpal tunnel syndrome   . Condyloma acuminata   . Diabetes mellitus without complication ([place])   . Diverticulosis   . Eczema    STABLE   . GERD (gastroesophageal reflux disease)   . H. pylori infection 11/16/1993  . Hiatal hernia   . History of benign adrenal tumor 2007  . History of benign adrenal tumor SEC   [facility]   . History of pneumonia 2007, 2008  . Hyperlipidemia   . Hypertension   . IBS (irritable bowel syndrome)   . Internal hemorrhoids   . Kyphosis   . Memory loss   . Metabolic syndrome   . Otitis media, serous, acute   . Sleep apnea    uses CPAP  . Symptomatic menopausal or female climacteric states   . Vitamin D deficiency     Current Outpatient Medications:  .  amitriptyline (ELAVIL) 10 MG tablet, TAKE 1 TABLET BY MOUTH AT  BEDTIME, Disp: 90 tablet, Rfl: 3 .  amLODipine (NORVASC) 5 MG tablet, TAKE 1 TABLET BY MOUTH  DAILY, Disp: 90 tablet, Rfl: 3 .  ARIPiprazole (ABILIFY) 5 MG tablet, TAKE 1 TABLET BY MOUTH  DAILY, Disp: 90 tablet, Rfl: 3 .  Ascorbic Acid (VITAMIN C) 1000 MG tablet, Take 1,000 mg by mouth daily., Disp: , Rfl:  .  aspirin EC 81 MG tablet, Take 1 tablet (81 mg total) by mouth daily., Disp: , Rfl:  .  Calcium Carb-Cholecalciferol 600-800 MG-UNIT TABS, Take 2 tablets by mouth daily., Disp: , Rfl:  .  Cholecalciferol (VITAMIN D-3) 5000 UNITS TABS, Take 5,000 Units by mouth daily., Disp: , Rfl:  .  escitalopram (LEXAPRO) 20 MG tablet, TAKE 1 TABLET BY MOUTH  DAILY, Disp: 90 tablet, Rfl: 0 .  fenofibrate 160 MG tablet, TAKE 1 TABLET BY MOUTH  DAILY, Disp: 90 tablet, Rfl: 1 .  fluticasone (FLONASE) 50 MCG/ACT nasal spray, Use 2 sprays in each  nostril daily, Disp: 48 g, Rfl: 2 .  losartan-hydrochlorothiazide (HYZAAR) 100-25 MG tablet, TAKE 1 TABLET BY MOUTH  DAILY AS  DIRECTED, Disp: 90 tablet, Rfl: 3 .  metFORMIN (GLUCOPHAGE) 1000 MG tablet, TAKE 1 TABLET BY MOUTH  TWICE DAILY WITH A MEAL (Patient not taking: Reported on 11/20/2020), Disp: 180 tablet, Rfl: 3 .  montelukast (SINGULAIR) 10 MG tablet, TAKE 1 TABLET BY MOUTH  DAILY, Disp: 90 tablet, Rfl: 3 .  mupirocin ointment (BACTROBAN) 2 %, Apply 1 application topically 2 (two) times daily., Disp: 22 g, Rfl: 3 .  omeprazole (PRILOSEC) 40 MG capsule, TAKE 1 CAPSULE BY MOUTH  DAILY, Disp: 90 capsule, Rfl: 0 .  PROAIR HFA 108 (90 BASE) MCG/ACT inhaler, Use 2 puffs into the lungs  every 6 hours as needed for wheezing or shortness of  breath, Disp: 25.5 g, Rfl: 2 .  promethazine-dextromethorphan (PROMETHAZINE-DM) 6.25-15 MG/5ML syrup, Take 5 mLs by mouth 4 (four) times daily as needed for cough., Disp: 180 mL, Rfl: 0 .  SYMBICORT 160-4.5 MCG/ACT inhaler, Use 2 puffs two times daily, Disp: 30.6 g, Rfl: 2 .  valACYclovir (VALTREX) 1000 MG tablet, 2 po BID for 1 day at fever blister onset. May repeat if reoccurs, Disp: 12 tablet, Rfl: 0  Assessment/ Plan: 70 y.o. female   COVID-19 virus infection  Sleep apnea with use of continuous positive airway pressure (CPAP) - Plan: Ambulatory referral to Neurology  Type 2 diabetes mellitus with other specified complication, without long-term current use of insulin ([place]) - Plan: Bayer DCA Hb A1c Waived, CMP14+EGFR, Lipid panel  Seems to be getting better. Declined antivirals. No red flags but we discussed red flag symptoms warranting further evaluation.  She voiced good understanding  Referral for CPAP/ PSG ordered with Dr Rexene Alberts  She will come in for fasting labs/ repeat A1c in a few weeks once she has recovered from [place].  Start time: 8:55am (Left message with spouse, pt sleeping); 12:07pm End time: 8:56am; 12:19  Total time spent on patient care (including telephone call/ virtual visit): 12 minutes  [name], [place] [phone]

## 2021-01-15 ENCOUNTER — Encounter: Payer: Self-pay | Admitting: Family Medicine

## 2021-01-20 ENCOUNTER — Other Ambulatory Visit: Payer: Self-pay | Admitting: Family Medicine

## 2021-01-20 MED ORDER — IRBESARTAN-HYDROCHLOROTHIAZIDE 150-12.5 MG PO TABS: 1.0000 | ORAL_TABLET | Freq: Every day | ORAL | 1 refills | Status: DC

## 2021-01-21 ENCOUNTER — Other Ambulatory Visit: Payer: Self-pay

## 2021-01-21 ENCOUNTER — Ambulatory Visit (INDEPENDENT_AMBULATORY_CARE_PROVIDER_SITE_OTHER): Payer: Medicare Other | Admitting: Family Medicine

## 2021-01-21 VITALS — BP 127/72 | HR 58 | Temp 97.0°F | Ht 59.0 in | Wt 174.0 lb

## 2021-01-21 DIAGNOSIS — R001 Bradycardia, unspecified: Secondary | ICD-10-CM | POA: Diagnosis not present

## 2021-01-21 DIAGNOSIS — E119 Type 2 diabetes mellitus without complications: Secondary | ICD-10-CM

## 2021-01-21 DIAGNOSIS — E1169 Type 2 diabetes mellitus with other specified complication: Secondary | ICD-10-CM | POA: Diagnosis not present

## 2021-01-21 LAB — BAYER DCA HB A1C WAIVED: HB A1C (BAYER DCA - WAIVED): 5.8 % (ref <7.0)

## 2021-01-21 NOTE — Patient Instructions (Signed)
Your EKG is essentially unchanged from the one that we did in 2018 except for a little bit slower.  I am going to send you to the cardiologist in [place] to evaluate this further. Sometimes I will perform an ultrasound of your heart.  You had labs performed today.  You will be contacted with the results of the labs once they are available, usually in the next 3 business days for routine lab work.  If you have an active my chart account, they will be released to your MyChart.  If you prefer to have these labs released to you via telephone, please let us know.   Bradycardia, Adult Bradycardia is a slower-than-normal heartbeat. A normal resting heart rate for an adult ranges from 60 to 100 beats per minute. With bradycardia, the resting heart rate is less than 60 beats per minute. Bradycardia can prevent enough oxygen from reaching certain areas of your body when you are active. It can be serious if it keeps enough oxygen from reaching your brain and other parts of your body. Bradycardia is not a problem for everyone. For some healthy adults, a slow resting heart rate is normal. What are the causes? This condition may be caused by:  A problem with the heart, including: ? A problem with the heart's electrical system, such as a heart block. With a heart block, electrical signals between the chambers of the heart are partially or completely blocked, so they are not able to work as they should. ? A problem with the heart's natural pacemaker (sinus node). ? Heart disease. ? A heart attack. ? Heart damage. ? Lyme disease. ? A heart infection. ? A heart condition that is present at birth (congenital heart defect).  Certain medicines that treat heart conditions.  Certain conditions, such as hypothyroidism and obstructive sleep apnea.  Problems with the balance of chemicals and other substances, like potassium, in the blood.  Trauma.  Radiation therapy.   What increases the risk? You are  more likely to develop this condition if you:  Are age 8 or older.  Have high blood pressure (hypertension), high cholesterol (hyperlipidemia), or diabetes.  Drink heavily, use tobacco or nicotine products, or use drugs. What are the signs or symptoms? Symptoms of this condition include:  Light-headedness.  Feeling faint or fainting.  Fatigue and weakness.  Trouble with activity or exercise.  Shortness of breath.  Chest pain (angina).  Drowsiness.  Confusion.  Dizziness. How is this diagnosed? This condition may be diagnosed based on:  Your symptoms.  Your medical history.  A physical exam. During the exam, your health care provider will listen to your heartbeat and check your pulse. To confirm the diagnosis, your health care provider may order tests, such as:  Blood tests.  An electrocardiogram (ECG). This test records the heart's electrical activity. The test can show how fast your heart is beating and whether the heartbeat is steady.  A test in which you wear a portable device (event recorder or Holter monitor) to record your heart's electrical activity while you go about your day.  Anexercise test. How is this treated? Treatment for this condition depends on the cause of the condition and how severe your symptoms are. Treatment may involve:  Treatment of the underlying condition.  Changing your medicines or how much medicine you take.  Having a small, battery-operated device called a pacemaker implanted under the skin. When bradycardia occurs, this device can be used to increase your heart rate and help your  heart beat in a regular rhythm. Follow these instructions at home: Lifestyle  Manage any health conditions that contribute to bradycardia as told by your health care provider.  Follow a heart-healthy diet. A nutrition specialist (dietitian) can help educate you about healthy food options and changes.  Follow an exercise program that is approved by  your health care provider.  Maintain a healthy weight.  Try to reduce or manage your stress, such as with yoga or meditation. If you need help reducing stress, ask your health care provider.  Do not use any products that contain nicotine or tobacco, such as cigarettes, e-cigarettes, and chewing tobacco. If you need help quitting, ask your health care provider.  Do not use illegal drugs.  Limit alcohol intake to no more than 1 drink a day for nonpregnant women and 2 drinks a day for men. Be aware of how much alcohol is in your drink. In the [place] one drink equals one 12 oz bottle of beer (355 mL), one 5 oz glass of wine (148 mL), or one 1 oz glass of hard liquor (44 mL).   General instructions  Take over-the-counter and prescription medicines only as told by your health care provider.  Keep all follow-up visits as told by your health care provider. This is important. How is this prevented? In some cases, bradycardia may be prevented by:  Treating underlying medical problems.  Stopping behaviors or medicines that can trigger the condition. Contact a health care provider if you:  Feel light-headed or dizzy.  Almost faint.  Feel weak or are easily fatigued during physical activity.  Experience confusion or have memory problems. Get help right away if:  You faint.  You have: ? An irregular heartbeat (palpitations). ? Chest pain. ? Trouble breathing. Summary  Bradycardia is a slower-than-normal heartbeat. With bradycardia, the resting heart rate is less than 60 beats per minute.  Treatment for this condition depends on the cause.  Manage any health conditions that contribute to bradycardia as told by your health care provider.  Do not use any products that contain nicotine or tobacco, such as cigarettes, e-cigarettes, and chewing tobacco, and limit alcohol intake.  Keep all follow-up visits as told by your health care provider. This is important. This information is not  intended to replace advice given to you by your health care provider. Make sure you discuss any questions you have with your health care provider. Document Revised: 06/12/2018 Document Reviewed: 05/10/2018 Elsevier Patient Education  [place].

## 2021-01-21 NOTE — Progress Notes (Signed)
Subjective: CC: Bradycardia PCP: Janora Norlander, DO ZOX:WRUEAV Melissa Bowman is a 70 y.o. female presenting to clinic today for:  1.  Bradycardia Patient has been having heart rate down into the 40s intermittently.  She is never had heart rates this low.  No new medications.  She was sick with COVID-19 in January.  She admits to being fatigued but otherwise has been asymptomatic.  No reports of edema, syncope, presyncope, dizziness.  No shortness of breath.   ROS: Per HPI  Allergies  Allergen Reactions  . Statins Other (See Comments)    myalgias  . E-Mycin [Erythromycin] Other (See Comments)    Medication intolerance stomach cramps  . Estrogens Other (See Comments)    Estrogen replacement Therapy intolerance mood changes  . Sulfa Antibiotics Other (See Comments)    Causes severe stomach cramps   . Vioxx [Rofecoxib] Other (See Comments)    Unknown reaction/interaction. Possible stomach cramps   Past Medical History:  Diagnosis Date  . Adrenal adenoma    left side   . Allergic rhinitis   . 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 Anxiety and depression   . Asthmatic bronchitis   . Bilateral carpal tunnel syndrome   . Condyloma acuminata   . Diabetes mellitus without complication ([place])   . Diverticulosis   . Eczema    STABLE   . GERD (gastroesophageal reflux disease)   . H. pylori infection 11/16/1993  . Hiatal hernia   . History of benign adrenal tumor 2007  . History of benign adrenal tumor [facility]   [facility]   . History of pneumonia 2007, 2008  . Hyperlipidemia   . Hypertension   . IBS (irritable bowel syndrome)   . Internal hemorrhoids   . Kyphosis   . Memory loss   . Metabolic syndrome   . Otitis media, serous, acute   . Sleep apnea    uses CPAP  . Symptomatic menopausal or female climacteric states   . Vitamin D deficiency     Current Outpatient Medications:  .  amitriptyline (ELAVIL) 10 MG tablet, TAKE 1 TABLET BY MOUTH AT  BEDTIME, Disp: 90 tablet, Rfl: 3 .  amLODipine  (NORVASC) 5 MG tablet, TAKE 1 TABLET BY MOUTH  DAILY, Disp: 90 tablet, Rfl: 3 .  ARIPiprazole (ABILIFY) 5 MG tablet, TAKE 1 TABLET BY MOUTH  DAILY, Disp: 90 tablet, Rfl: 3 .  Ascorbic Acid (VITAMIN C) 1000 MG tablet, Take 1,000 mg by mouth daily., Disp: , Rfl:  .  aspirin EC 81 MG tablet, Take 1 tablet (81 mg total) by mouth daily., Disp: , Rfl:  .  Calcium Carb-Cholecalciferol 600-800 MG-UNIT TABS, Take 2 tablets by mouth daily., Disp: , Rfl:  .  Cholecalciferol (VITAMIN D-3) 5000 UNITS TABS, Take 5,000 Units by mouth daily., Disp: , Rfl:  .  escitalopram (LEXAPRO) 20 MG tablet, TAKE 1 TABLET BY MOUTH  DAILY, Disp: 90 tablet, Rfl: 0 .  fenofibrate 160 MG tablet, TAKE 1 TABLET BY MOUTH  DAILY, Disp: 90 tablet, Rfl: 1 .  fluticasone (FLONASE) 50 MCG/ACT nasal spray, Use 2 sprays in each  nostril daily, Disp: 48 g, Rfl: 2 .  irbesartan-hydrochlorothiazide (AVALIDE) 150-12.5 MG tablet, Take 1 tablet by mouth daily. To replace Valsartan- HCTZ, Disp: 90 tablet, Rfl: 1 .  montelukast (SINGULAIR) 10 MG tablet, TAKE 1 TABLET BY MOUTH  DAILY, Disp: 90 tablet, Rfl: 3 .  mupirocin ointment (BACTROBAN) 2 %, Apply 1 application topically 2 (two) times daily., Disp: 22 g,  Rfl: 3 .  omeprazole (PRILOSEC) 40 MG capsule, TAKE 1 CAPSULE BY MOUTH  DAILY, Disp: 90 capsule, Rfl: 0 .  PROAIR HFA 108 (90 BASE) MCG/ACT inhaler, Use 2 puffs into the lungs  every 6 hours as needed for wheezing or shortness of  breath, Disp: 25.5 g, Rfl: 2 .  promethazine-dextromethorphan (PROMETHAZINE-DM) 6.25-15 MG/5ML syrup, Take 5 mLs by mouth 4 (four) times daily as needed for cough., Disp: 180 mL, Rfl: 0 .  SYMBICORT 160-4.5 MCG/ACT inhaler, Use 2 puffs two times daily, Disp: 30.6 g, Rfl: 2 .  valACYclovir (VALTREX) 1000 MG tablet, 2 po BID for 1 day at fever blister onset. May repeat if reoccurs, Disp: 12 tablet, Rfl: 0 Social History   Socioeconomic History  . Marital status: Married    Spouse name: [name]  . Number of children:  2  . Years of education: some college  . Highest education level: Some college, no degree  Occupational History  . Occupation: retired    Comment: daycare  Tobacco Use  . Smoking status: Never Smoker  . Smokeless tobacco: Never Used  Vaping Use  . Vaping Use: Never used  Substance and Sexual Activity  . Alcohol use: No  . Drug use: No  . Sexual activity: Not on file  Other Topics Concern  . Not on file  Social History Narrative   Melissa Bowman is retired. She lives with her husband [name]. They have two grown daughters that live locally. Gracemarie enjoys reading and puzzles.    Social Determinants of Health   Financial Resource Strain: Not on file  Food Insecurity: Not on file  Transportation Needs: Not on file  Physical Activity: Not on file  Stress: Not on file  Social Connections: Not on file  Intimate Partner Violence: Not on file   Family History  Problem Relation Age of Onset  . Parkinsonism Father   . Emphysema Father   . Hip fracture Father   . Heart disease Mother   . COPD Brother   . Hypertension Brother   . Heart disease Brother   . Heart failure Sister   . Ovarian cancer Sister   . COPD Sister   . Lymphoma Sister   . COPD Sister   . COPD Brother     Objective: Office vital signs reviewed. BP 127/72   Pulse (!) 58   Temp (!) 97 Melissa (36.1 C) (Temporal)   Ht $R'4\' 11"'Bk$  (1.499 m)   Wt 174 lb (78.9 kg)   BMI 35.14 kg/m   Physical Examination:  General: Awake, alert, well nourished, No acute distress HEENT: Normal, sclera white Cardio: bradycardic but regular rhythm, S1S2 heard, no murmurs appreciated Pulm: clear to auscultation bilaterally, no wheezes, rhonchi or rales; normal work of breathing on room air  Assessment/ Plan: 70 y.o. female   Bradycardia - Plan: EKG 12-Lead, CMP14+EGFR, Magnesium, Thyroid Panel With TSH, Ambulatory referral to Cardiology  Diet-controlled diabetes mellitus ([place]) - Plan: Bayer DCA Hb A1c Waived  I reviewed her EKG which  looks relatively unchanged from her check in 2018.  Uncertain etiology of the bradycardia but she did have a COVID-19 infection prior to onset of bradycardia.  Her heart rate is not in the 40s, which is reassuring.  Blood pressure was appropriate.  I am sending her to cardiology for further evaluation.  Structural eval with echocardiogram may be warranted given recent events but will defer to the expertise of her specialist.  In the interim, I have obtained CMP, mag, thyroid  levels.  We will see if there is any metabolic etiology  Diabetes was not discussed during today's visit but A1c was checked since we are checking the above labs.  Sugar is under excellent control with A1c of 5.8.  Orders Placed This Encounter  Procedures  . CMP14+EGFR  . Magnesium  . Thyroid Panel With TSH  . Bayer DCA Hb A1c Waived  . EKG 12-Lead   No orders of the defined types were placed in this encounter.    Janora Norlander, DO [place] [phone]

## 2021-01-22 LAB — CMP14+EGFR
ALT: 14 IU/L (ref 0–32)
AST: 14 IU/L (ref 0–40)
Albumin/Globulin Ratio: 1.6 (ref 1.2–2.2)
Albumin: 4.6 g/dL (ref 3.8–4.8)
Alkaline Phosphatase: 46 IU/L (ref 44–121)
BUN/Creatinine Ratio: 25 (ref 12–28)
BUN: 20 mg/dL (ref 8–27)
Bilirubin Total: 0.3 mg/dL (ref 0.0–1.2)
CO2: 25 mmol/L (ref 20–29)
Calcium: 10.4 mg/dL — ABNORMAL HIGH (ref 8.7–10.3)
Chloride: 100 mmol/L (ref 96–106)
Creatinine, Ser: 0.79 mg/dL (ref 0.57–1.00)
GFR calc Af Amer: 88 mL/min/{1.73_m2} (ref >59)
GFR calc non Af Amer: 76 mL/min/{1.73_m2} (ref >59)
Globulin, Total: 2.8 g/dL (ref 1.5–4.5)
Glucose: 108 mg/dL — ABNORMAL HIGH (ref 65–99)
Potassium: 4.3 mmol/L (ref 3.5–5.2)
Sodium: 143 mmol/L (ref 134–144)
Total Protein: 7.4 g/dL (ref 6.0–8.5)

## 2021-01-22 LAB — THYROID PANEL WITH TSH
Free Thyroxine Index: 2.1 (ref 1.2–4.9)
T3 Uptake Ratio: 20 % — ABNORMAL LOW (ref 24–39)
T4, Total: 10.6 ug/dL (ref 4.5–12.0)
TSH: 2.29 u[IU]/mL (ref 0.450–4.500)

## 2021-01-22 LAB — MAGNESIUM: Magnesium: 2 mg/dL (ref 1.6–2.3)

## 2021-02-05 ENCOUNTER — Encounter: Payer: Self-pay | Admitting: Neurology

## 2021-02-05 ENCOUNTER — Ambulatory Visit: Payer: Medicare Other | Admitting: Neurology

## 2021-02-05 VITALS — BP 136/73 | HR 58 | Ht 60.0 in | Wt 176.0 lb

## 2021-02-05 DIAGNOSIS — E669 Obesity, unspecified: Secondary | ICD-10-CM | POA: Diagnosis not present

## 2021-02-05 DIAGNOSIS — G4719 Other hypersomnia: Secondary | ICD-10-CM | POA: Diagnosis not present

## 2021-02-05 DIAGNOSIS — Z9889 Other specified postprocedural states: Secondary | ICD-10-CM

## 2021-02-05 DIAGNOSIS — Z9989 Dependence on other enabling machines and devices: Secondary | ICD-10-CM | POA: Diagnosis not present

## 2021-02-05 DIAGNOSIS — G4733 Obstructive sleep apnea (adult) (pediatric): Secondary | ICD-10-CM | POA: Diagnosis not present

## 2021-02-05 NOTE — Patient Instructions (Addendum)
It was nice to see you both again today. Based on your symptoms and your exam I believe you are still at risk for obstructive sleep apnea and would benefit from reevaluation as it has been many years and you need new supplies and an updated machine.   As discussed, we will proceed with a home sleep test for reevaluation of your sleep apnea at home with test equipment.  You should not use your CPAP for that test night.    Please remember, the risks and ramifications of moderate to severe obstructive sleep apnea or OSA are: Cardiovascular disease, including congestive heart failure, stroke, difficult to control hypertension, arrhythmias, and even type 2 diabetes has been linked to untreated OSA. Sleep apnea causes disruption of sleep and sleep deprivation in most cases, which, in turn, can cause recurrent headaches, problems with memory, mood, concentration, focus, and vigilance. Most people with untreated sleep apnea report excessive daytime sleepiness, which can affect their ability to drive. Please do not drive if you feel sleepy.   We will plan to follow-up after testing.  We will keep you posted as to your test results by phone call as well.  Based on your sleep study results, I will write for a new machine, likely add new AutoPap machine.  Our sleep lab administrative assistant will call you to schedule your sleep study. If you don't hear back from her by about 2 weeks from now, please feel free to call her at [phone]. You can leave a message with your phone number and concerns, if you get the voicemail box. She will call back as soon as possible.

## 2021-02-05 NOTE — Progress Notes (Signed)
Subjective:    Patient ID: Melissa Bowman is a 70 y.o. female.  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  The patient is accompanied by her husband today, and is referred by her primary care physician, Dr. Lajuana Ripple.  I have evaluated her for her gait disorder and parkinsonism and last saw her on 11/20/2020, at which time we talked about her benign DaTscan results.  She was advised to proceed with a brain MRI without contrast.  She had an interim brain MRI without contrast on 11/30/2020 and I reviewed the results:    IMPRESSION: This MRI of the brain without contrast shows the following: 1.   Scattered T2/FLAIR hyperintense foci in the hemispheres, right internal capsule and right basal ganglia consistent with chronic microvascular ischemic change.  None of the foci appear to be acute.  Compared to the CT scan from 2018, there do do not appear to be any new lesions. 2.   Left chronic sphenoid sinusitis.   3.   No acute findings.  We called her with her test results.  Today, 02/05/2021: She reports being consistent with her CPAP.  She did not bring her machine, per husband, it is automatic PAP machine.  He purchased this machine from a local pharmacy after her first 1 broke.  He gets supplies on [employer].  He reports that she was diagnosed with severe sleep apnea.  Testing was over 10 years ago and this machine is at least 71 years old.  She uses a nasal mask.  She has benefited from treatment.  Her original sleep apnea diagnosis may date back much further since she had UPPP and tonsillectomy under Dr.  Erik Obey in 1497 per patient (per chart review: in May 1996).  She goes to bed around 930 and sleeps typically till 730.  If her husband is not there in the morning because he is picking up grandchildren or running an errand, she may sleep till 930.  She does feel sleepy during the day.  Her Epworth sleepiness score is 12 out of 24, fatigue severity score is 57 out of 63.  She drinks caffeine in the form of coffee, 2 cups in the morning.  She is a non-smoker and does not drink alcohol.   She reports that she cannot sleep without CPAP.  The patient's allergies, current medications, family history, past medical history, past social history, past surgical history and problem list were reviewed and updated as appropriate.    Previously:   I first met her at the request of her primary care physician on 08/06/2020, at which time she had a several month history of difficulty with her balance and mobility, changes in her posture and her gait.  She had evidence of parkinsonism without telltale lateralization.  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  She was on Abilify at the time. I suggested we proceed with a DaTscan.  We delayed the DaTscan until she was off of Abilify as she was weaned off of it.     She had a DaT scan on 11/13/20 and I reviewed the results: IMPRESSION: Normal DATscan. Findings are NOT suggestive of Parkinson's syndrome Pathology   They were  notified of the test results by phone call.   11/20/20: She reports feeling the same, not much different, certainly not worse. Per daughter, her trembling and mobility may be a little bit better. She was never actually completely off of the Abilify and reduce it to 2.5 mg daily but felt better on 5 mg daily. Daughter reports that patient's husband has also noted decline in the mood since the Abilify was reduced. The patient has hearing aids but never liked using them and does not typically use them now. She is compliant with her CPAP but  has not had reevaluation in years, she purchased a CPAP machine or AutoPap machine out-of-pocket several years ago, she had a gap in her insurance at the time and needed a new machine. Sleep testing was probably 15 or 20 years ago. Memory is more or less stable, she is forgetful. Her daughter is concerned that she may have had a lacunar stroke before. Back in April, around Easter, in fact when they had a family gathering and were taking pictures for Easter, patient had an episode of not being fully attentive, not acting her typical self, no obvious one-sided weakness or numbness or slurring of speech or droopy face at the time. She has not had a recent brain MRI.     08/06/20: (She) reports very little of her own history, primarily due to hearing loss. Her history is primarily provided by her husband, who reports that since approximately April of this year the patient has had more difficulty with her balance and mobility.  She has had changes in her mood.  He reports that she has a longstanding history of depression and anxiety and has tried different medications.  She was on Effexor for years and recently she was placed on Abilify and Lexapro.  This was around March 2021.  She has had changes in her posture and her gait, she has been noted to be slower in her movements.  This has also been noted by her daughter who is a Marine scientist.  She endorses stress, particularly with regards to her other daughter.  Both daughters live close by.  She has a total of 5 grandchildren and has been taking care of 2 grandchildren ages 53 and 39 but the 70-year-old recently started school which has improved her stress level she indicates.  She has not fallen recently but has fallen in the past.  She is the youngest of a total of 16 children and most of her siblings passed away, she denies any history of tremor in her siblings or memory loss but reports that her father had Parkinson's disease and died at age 76 and her brother had Parkinson's  disease and Alzheimer's dementia and died in his 45s, probably around 70.  She has now 3 sisters alive and one more brother alive.  She is a non-smoker and drinks no alcohol, she likes to drink coffee and drinks about 2 to 3 cups/day.  She has had unintended weight loss since approximately April of this year and had some work-up for this.  She denies any loss of appetite.  She has hearing aids but does not wear them.  She had surgery for sleep apnea in 1997 and also uses a CPAP machine.  She reports trouble with her right shoulder and had 3 rotator cuff surgeries.  She has had some memory loss including forgetfulness.  She has been sleeping fairly well but does have a history of dream enactment behavior for the past 1+ year.  Within the past year she fell out of bed twice, she reports that she rolled out of bed and did not injure herself and went back to bed.  She takes melatonin for sleep.   She had a head CT and cervical spine CT without contrast after a fall on 11/17/2017 reviewed the results: IMPRESSION: Left frontoparietal scalp contusion.   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 No acute intracranial abnormality.   No acute cervical spine fracture or posttraumatic listhesis.  Her Past Medical History Is Significant For: Past Medical History:  Diagnosis Date  . Adrenal adenoma    left side   . Allergic rhinitis   . 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 Anxiety and depression   . Asthmatic bronchitis   . Bilateral carpal tunnel syndrome   . Condyloma acuminata   . Diabetes mellitus without complication ([place])   . Diverticulosis   . Eczema    STABLE   . GERD (gastroesophageal reflux disease)   . H. pylori infection 11/16/1993  . Hiatal hernia   . History of benign adrenal tumor 2007  . History of benign adrenal tumor [facility]   [facility]   . History of pneumonia 2007, 2008  . Hyperlipidemia   . Hypertension   . IBS (irritable bowel syndrome)   . Internal hemorrhoids   . Kyphosis    . Memory loss   . Metabolic syndrome   . Otitis media, serous, acute   . Sleep apnea    uses CPAP  . Symptomatic menopausal or female climacteric states   . Vitamin D deficiency     Her Past Surgical History Is Significant For: Past Surgical History:  Procedure Laterality Date  . abdominal wall lematoma of fabdd hys.    . APPENDECTOMY    . CARPAL TUNNEL RELEASE    . CESAREAN SECTION     [place]    . right breast biopsy  10/96  . ROTATOR CUFF REPAIR  2002   right  . ROTATOR CUFF REPAIR/ bone spur  08/30/09  . SHOULDER ARTHROSCOPY WITH ROTATOR CUFF REPAIR AND SUBACROMIAL DECOMPRESSION Right 01/19/2018   Procedure: REVISION RIGHT MINI OPEN ROTATOR CUFF REPAIR AND SUBACROMIAL DECOMPRESSION;  Surgeon: Susa Day, MD;  Location: [facility];  Service: Orthopedics;  Laterality: Right;  90 MINS  . sleep apnea surgery  5/96  . torn cartiledge rt knee x2  05/13/06   Dr. Charlotta Newton   . TOTAL ABDOMINAL HYSTERECTOMY W/ BILATERAL SALPINGOOPHORECTOMY  8/94   fibroids     Her Family History Is Significant For: Family History  Problem Relation Age of Onset  . Parkinsonism Father   . Emphysema Father   . Hip fracture Father   . Heart disease Mother   . COPD Brother   . Hypertension Brother   . Heart disease Brother   . Heart failure Sister   . Ovarian cancer Sister   . COPD Sister   . Lymphoma Sister   . COPD Sister   . COPD Brother     Her Social History Is Significant For: Social History   Socioeconomic History  . Marital status: Married    Spouse name: [name]  . Number of children: 2  . Years of education: some college  . Highest education level: Some college, no degree  Occupational History  . Occupation: retired    Comment: daycare  Tobacco Use  . Smoking status: Never Smoker  . Smokeless tobacco: Never Used  Vaping Use  .  Vaping Use: Never used  Substance and Sexual Activity  . Alcohol use: No  . Drug use: No  . Sexual activity: Not on file   Other Topics Concern  . Not on file  Social History Narrative   Kayleana is retired. She lives with her husband [name]. They have two grown daughters that live locally. Brindley enjoys reading and puzzles.    Social Determinants of Health   Financial Resource Strain: Not on file  Food Insecurity: Not on file  Transportation Needs: Not on file  Physical Activity: Not on file  Stress: Not on file  Social Connections: Not on file    Her Allergies Are:  Allergies  Allergen Reactions  . Statins Other (See Comments)    myalgias  . E-Mycin [Erythromycin] Other (See Comments)    Medication intolerance stomach cramps  . Estrogens Other (See Comments)    Estrogen replacement Therapy intolerance mood changes  . Sulfa Antibiotics Other (See Comments)    Causes severe stomach cramps   . Vioxx [Rofecoxib] Other (See Comments)    Unknown reaction/interaction. Possible stomach cramps  :   Her Current Medications Are:  Outpatient Encounter Medications as of 02/05/2021  Medication Sig  . amitriptyline (ELAVIL) 10 MG tablet TAKE 1 TABLET BY MOUTH AT  BEDTIME  . amLODipine (NORVASC) 5 MG tablet TAKE 1 TABLET BY MOUTH  DAILY  . ARIPiprazole (ABILIFY) 5 MG tablet TAKE 1 TABLET BY MOUTH  DAILY  . Ascorbic Acid (VITAMIN C) 1000 MG tablet Take 1,000 mg by mouth daily.  Marland Kitchen aspirin EC 81 MG tablet Take 1 tablet (81 mg total) by mouth daily.  . Calcium Carb-Cholecalciferol 600-800 MG-UNIT TABS Take 2 tablets by mouth daily.  . Cholecalciferol (VITAMIN D-3) 5000 UNITS TABS Take 5,000 Units by mouth daily.  Marland Kitchen escitalopram (LEXAPRO) 20 MG tablet TAKE 1 TABLET BY MOUTH  DAILY  . fenofibrate 160 MG tablet TAKE 1 TABLET BY MOUTH  DAILY  . fluticasone (FLONASE) 50 MCG/ACT nasal spray Use 2 sprays in each  nostril daily  . irbesartan-hydrochlorothiazide (AVALIDE) 150-12.5 MG tablet Take 1 tablet by mouth daily. To replace Valsartan- HCTZ  . montelukast (SINGULAIR) 10 MG tablet TAKE 1 TABLET BY MOUTH  DAILY  .  mupirocin ointment (BACTROBAN) 2 % Apply 1 application topically 2 (two) times daily.  Marland Kitchen omeprazole (PRILOSEC) 40 MG capsule TAKE 1 CAPSULE BY MOUTH  DAILY  . PROAIR HFA 108 (90 BASE) MCG/ACT inhaler Use 2 puffs into the lungs  every 6 hours as needed for wheezing or shortness of  breath  . promethazine-dextromethorphan (PROMETHAZINE-DM) 6.25-15 MG/5ML syrup Take 5 mLs by mouth 4 (four) times daily as needed for cough.  . SYMBICORT 160-4.5 MCG/ACT inhaler Use 2 puffs two times daily  . valACYclovir (VALTREX) 1000 MG tablet 2 po BID for 1 day at fever blister onset. May repeat if reoccurs   No facility-administered encounter medications on file as of 02/05/2021.  :  Review of Systems:  Out of a complete 14 point review of systems, all are reviewed and negative with the exception of these symptoms as listed below: Review of Systems  Neurological:       Here for sleep consult. Prior sleep pt reports she does use cpap at home every night. Did not bring her machine today. Pt thinks her machine is around 71 years old. She does not have a local DME company, supplies are purchased online.  Epworth Sleepiness Scale 0= would never doze 1= slight chance of dozing  2= moderate chance of dozing 3= high chance of dozing  Sitting and reading:3 Watching TV:3 Sitting inactive in a public place (ex. Theater or meeting):0 As a passenger in a car for an hour without a break:0 Lying down to rest in the afternoon:0 Sitting and talking to someone:0 Sitting quietly after lunch (no alcohol):3 In a car, while stopped in traffic:0 Total:12     Objective:  Neurological Exam  Physical Exam Physical Examination:   Vitals:   02/05/21 0857  BP: 136/73  Pulse: (!) 58   General Examination: The patient is a very pleasant 70 y.o. female in no acute distress. She appears well-developed and well-nourished and well groomed.   HEENT:Normocephalic, atraumatic, pupils are equal, round and reactive to light.  Tracking is mildly impaired with saccadic breakdown of smooth pursuit. She has mild facial masking. She has mild nuchal rigidity. She has a lower lip and jaw tremor, seems stable. She has mild hypophonia. She is hard of hearing, no hearing aids in place. Speech is otherwise scant, no dysarthria, airway examination reveals mild mouth dryness, status post UPPP, thicker tongue noted, tonsils absent.  Neck circumference of 19 inches. No carotid bruits.  Chest:Clear to auscultation without wheezing, rhonchi or crackles noted.  Heart:S1+S2+0, regular and normal without murmurs, rubs or gallops noted.   Abdomen:Soft, non-tender and non-distended with normal bowel sounds appreciated on auscultation.  Extremities:There isnopitting edema in the distal lower extremities bilaterally.  Skin: Warm and dry without trophic changes noted.  Musculoskeletal: exam revealsdecreased mobility in her right shoulder.   Neurologically:  Mental status: The patient is awake, alert and oriented in all 4 spheres.Herimmediate and remote memory, attention, language skills and fund of knowledge are mildly impaired, she has some bradyphrenia, butshe is also quite hard of hearing.Mood isconstrictedand affect is blunted, stable.  She looks to her husband for answers to most questions. Cranial nerves II - XII are as described above under HEENT exam.  Motor exam:Normal bulk, tone is mildly increased in the upper extremities, no cogwheeling noted in the right upper extremity today. She has no a minimal resting tremor in both upper extremities, mild postural tremor but intermittent in both upper extremities, no significant action tremor.Romberg is not tested for safety concerns. On fine motor testing, she has mild impairment in both upper and lower extremities.  She has overall mild to moderate slowness in her movements, no dyskinesias.  Cerebellar testing: No dysmetria or intention tremor. There is no  truncal or gait ataxia.  Sensory exam: intact to light touch in the upper and lower extremities. Gait, station and balance:Shestands up with mild difficulty and pushes herself up. Her posture is mildly stooped for age, she walks slowly and has decreased swing bilaterally.  Assessmentand Plan:   In summary,Kamirah F Tilleyis avery pleasant20 year oldfemalewith an underlying medical history of vitamin D deficiency, hypertension, hyperlipidemia, irritable bowel syndrome, eczema, reflux disease, diverticulosis, diabetes, anxiety, depression, allergic rhinitis, adrenal tumor, sleep apnea and obesity, whopresents for evaluation of her obstructive sleep apnea of several years duration.  She is on AutoPap therapy per husband.  She is compliant with treatment per patient and husband.  She has benefited from treatment.  A CPAP or AutoPap compliance download was not available today, she did not bring her machine but machine is also older, at least 71 years old and testing was several years prior.  She would benefit from reevaluation and updating and upgrading her machine and supplies.  She is advised that a new machine can be  issued from a local durable medical equipment company and filed with insurance, also her supplies hopefully.  She is agreeable to pursuing a sleep test and we mutually agreed to plan for a home sleep test for better tolerance and convenience for her.  She has anxiety and her husband believes that she would not sleep well or at all in the sleep lab.  For purposes of reevaluation a home sleep test should suffice.  She is advised that we will be in touch with them arrange for her pickup date and time of her testing equipment.  That night she should not use her PAP machine.   As far as her gait disorder, cognitive complaints, changes in posture and fine motor dyscontrol, she seems stable.  She is on Abilify low-dose. A DaTscan in November 2021 was reported as normal. 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  We will continue to monitor her exam.  We will plan to follow-up after sleep testing.  Based on sleep study results I will prescribe a new machine.  I answered all their questions today and the patient and her husband were in agreement.   I spent 30 minutes in total face-to-face time and in reviewing records during pre-charting, more than 50% of which was spent in counseling and coordination of care, reviewing test results, reviewing medications and treatment regimen and/or in discussing or reviewing the diagnosis of OSA, the prognosis and treatment options. Pertinent laboratory and imaging test results that were available during this visit with the patient were reviewed by me and considered in my medical decision making (see chart for details).

## 2021-03-03 ENCOUNTER — Ambulatory Visit: Payer: Medicare Other | Admitting: Neurology

## 2021-03-03 DIAGNOSIS — E669 Obesity, unspecified: Secondary | ICD-10-CM

## 2021-03-03 DIAGNOSIS — G4733 Obstructive sleep apnea (adult) (pediatric): Secondary | ICD-10-CM

## 2021-03-03 DIAGNOSIS — Z9889 Other specified postprocedural states: Secondary | ICD-10-CM

## 2021-03-03 DIAGNOSIS — G4719 Other hypersomnia: Secondary | ICD-10-CM

## 2021-03-12 ENCOUNTER — Other Ambulatory Visit: Payer: Self-pay | Admitting: Family Medicine

## 2021-03-16 ENCOUNTER — Ambulatory Visit (INDEPENDENT_AMBULATORY_CARE_PROVIDER_SITE_OTHER): Payer: Medicare Other | Admitting: Neurology

## 2021-03-16 DIAGNOSIS — G4733 Obstructive sleep apnea (adult) (pediatric): Secondary | ICD-10-CM

## 2021-03-23 ENCOUNTER — Other Ambulatory Visit: Payer: Self-pay | Admitting: Family Medicine

## 2021-03-23 NOTE — Progress Notes (Signed)
See procedure note.

## 2021-03-24 NOTE — Progress Notes (Signed)
Patient referred by PCP for re-eval of her OSA, seen by me on 02/05/21, patient had a HST on 03/16/21 (repeat from 03/03/21).    Please call and notify the patient that the recent home sleep test showed obstructive sleep apnea in the severe range. She has an older machine, likely autoPAP. I would like to write for a new machine. Until she can get new equipment, please advise her to continue with her current machine. We can use a DME of her choice. We will need a FU in sleep clinic for 10 weeks post-PAP set up, please arrange that with me or one of our NPs. Also reinforce the need for compliance with treatment. Thanks,   Star Age, MD, PhD [facility])

## 2021-03-24 NOTE — Procedures (Signed)
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   Epworth sleepiness score: 12/24  BMI: 35.1 kg/m  Neck Circumference: 19 "  FINDINGS:   Total Record Time (hours, min):         9 H  Total Sleep Time (hours, min):            8 H      Percent REM (%):       14.0       Calculated pAHI (per hour):    59.6                 REM pAHI:    61.0           NREM pAHI:  59.4 Supine AHI:67.1   Oxygen Saturation (%) Mean: 92         Minimum oxygen saturation (%):    81              O2 Saturation Range (%): 81 - 98                  O2Saturation (minutes) <=88%:12.2   Pulse Mean (bpm):  48                         Pulse Range (38 - 66)   IMPRESSION: OSA (obstructive sleep apnea), severe  RECOMMENDATION:  This home sleep test demonstrates severe obstructive sleep apnea with a total AHI of 59.6/hour and O2 nadir of 81%. Ongoing treatment with positive airway pressure is recommended. The patient will be advised to proceed with an autoPAP titration/trial at home for now. A full night titration study may be considered to optimize treatment settings, if needed down the road. Please note that untreated obstructive sleep apnea may carry additional perioperative morbidity. Patients with significant obstructive sleep apnea should receive perioperative PAP therapy and the surgeons and particularly the  anesthesiologist should be informed of the diagnosis and the severity of the sleep disordered breathing. The patient should be cautioned not to drive, work at heights, or operate dangerous or heavy equipment when tired or sleepy. Review and reiteration of good sleep hygiene measures should be pursued with any patient. Other causes of the patient's symptoms, including circadian rhythm disturbances, an underlying mood disorder, medication effect and/or an underlying medical problem cannot be ruled out based on this test. Clinical correlation is recommended. The patient and her referring provider will be notified of the test results. The patient will be seen in follow up in sleep clinic at [facility].  I certify that I have reviewed the raw data recording prior to the issuance of this report in accordance with the standards of the American Academy of Sleep Medicine (AASM).   INTERPRETING PHYSICIAN:  Huston Foley, MD, PhD  Board Certified in Neurology and Sleep Medicine  [facility] [address], [address] [place], [place] [address] [phone]

## 2021-03-26 ENCOUNTER — Telehealth: Payer: Self-pay

## 2021-03-26 NOTE — Telephone Encounter (Signed)
-----   Message from Star Age, MD sent at 03/24/2021  5:51 PM EDT ----- Patient referred by PCP for re-eval of her OSA, seen by me on 02/05/21, patient had a HST on 03/16/21 (repeat from 03/03/21).    Please call and notify the patient that the recent home sleep test showed obstructive sleep apnea in the severe range. She has an older machine, likely autoPAP. I would like to write for a new machine. Until she can get new equipment, please advise her to continue with her current machine. We can use a DME of her choice. We will need a FU in sleep clinic for 10 weeks post-PAP set up, please arrange that with me or one of our NPs. Also reinforce the need for compliance with treatment. Thanks,   Star Age, MD, PhD [facility])

## 2021-03-26 NOTE — Telephone Encounter (Signed)
I called pt's husband Letitia Libra ok per dpr) and advised of message. He verbalized understanding and order for new machine has been sent to [facility]. Husband understand insurance guidelines and tentative appt has been scheduled for 06/08/21 at 100 pm. Letter mailed to pt reiterating information as well.

## 2021-03-30 ENCOUNTER — Other Ambulatory Visit: Payer: Self-pay

## 2021-03-30 ENCOUNTER — Other Ambulatory Visit: Payer: Self-pay | Admitting: Cardiology

## 2021-03-30 ENCOUNTER — Encounter: Payer: Self-pay | Admitting: Cardiology

## 2021-03-30 ENCOUNTER — Ambulatory Visit: Payer: Medicare Other | Admitting: Cardiology

## 2021-03-30 ENCOUNTER — Ambulatory Visit (INDEPENDENT_AMBULATORY_CARE_PROVIDER_SITE_OTHER): Payer: Medicare Other

## 2021-03-30 VITALS — BP 130/66 | HR 64 | Ht 60.0 in | Wt 171.0 lb

## 2021-03-30 DIAGNOSIS — I10 Essential (primary) hypertension: Secondary | ICD-10-CM

## 2021-03-30 DIAGNOSIS — R001 Bradycardia, unspecified: Secondary | ICD-10-CM

## 2021-03-30 DIAGNOSIS — R011 Cardiac murmur, unspecified: Secondary | ICD-10-CM

## 2021-03-30 NOTE — Progress Notes (Signed)
Cardiology Office Note  Date: 03/30/2021   ID: Melissa Bowman, DOB 1951-02-16, MRN [id number]  PCP:  [name], DO  Cardiologist:  Nona Dell, MD Electrophysiologist:  None   Chief Complaint  Patient presents with  . Referred with history of bradycardia    History of Present Illness: Melissa Bowman is a 70 y.o. female referred for cardiology consultation by Dr. Nadine Counts for the evaluation of bradycardia.  She is here today with her husband.  She tells me that since having COVID-19 earlier in the year she has been periodically checking her oxygen saturation with a pulse oximeter at home.  She has noticed that her heart rate sometimes has been in the 50s, occasionally in the 40s, not obviously symptomatic at the time.  She does not describe any sudden dizziness or syncope.  No history of cardiac arrhythmias.  Chart review finds previous evaluation by Dr. Purvis Sheffield in 2019 for preoperative cardiac evaluation.  Echocardiogram at that time revealed LVEF 65 to 70%, calcified and sclerotic aortic valve without evidence of stenosis, normal estimated PASP of 30 mmHg.  We went over her medications, listed below.  Lab work in February included normal TSH.  Past Medical History:  Diagnosis Date  . Allergic rhinitis   . 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 Anxiety and depression   . Asthmatic bronchitis   . Bilateral carpal tunnel syndrome   . Condyloma acuminata   . Diverticulosis   . Eczema   . GERD (gastroesophageal reflux disease)   . H. pylori infection 11/16/1993  . Hiatal hernia   . History of benign adrenal tumor 2007   [facility]  . History of pneumonia 2007, 2008  . Hyperlipidemia   . Hypertension   . IBS (irritable bowel syndrome)   . Internal hemorrhoids   . Kyphosis   . Memory loss   . Menopause   . Metabolic syndrome   . OSA on CPAP   . Type 2 diabetes mellitus (HCC)   . Vitamin D deficiency     Past Surgical History:  Procedure Laterality Date  .  abdominal wall lematoma of fabdd hys.    . APPENDECTOMY    . CARPAL TUNNEL RELEASE    . CESAREAN SECTION     X2  . INCISIONAL HERNIA REPAIR    . right breast biopsy  10/96  . ROTATOR CUFF REPAIR  2002   right  . ROTATOR CUFF REPAIR/ bone spur  08/30/09  . SHOULDER ARTHROSCOPY WITH ROTATOR CUFF REPAIR AND SUBACROMIAL DECOMPRESSION Right 01/19/2018   Procedure: REVISION RIGHT MINI OPEN ROTATOR CUFF REPAIR AND SUBACROMIAL DECOMPRESSION;  Surgeon: Jene Every, MD;  Location: [facility];  Service: Orthopedics;  Laterality: Right;  90 MINS  . sleep apnea surgery  5/96  . torn cartiledge rt knee x2  05/13/06   Dr. Trenton Founds   . TOTAL ABDOMINAL HYSTERECTOMY W/ BILATERAL SALPINGOOPHORECTOMY  8/94   fibroids     Current Outpatient Medications  Medication Sig Dispense Refill  . amitriptyline (ELAVIL) 10 MG tablet TAKE 1 TABLET BY MOUTH AT  BEDTIME 90 tablet 3  . amLODipine (NORVASC) 5 MG tablet TAKE 1 TABLET BY MOUTH  DAILY 90 tablet 3  . ARIPiprazole (ABILIFY) 5 MG tablet TAKE 1 TABLET BY MOUTH  DAILY 90 tablet 3  . Ascorbic Acid (VITAMIN C) 1000 MG tablet Take 1,000 mg by mouth daily.    Marland Kitchen aspirin EC 81 MG tablet Take 1 tablet (81 mg total) by mouth daily.    Marland Kitchen  Calcium Carb-Cholecalciferol 600-800 MG-UNIT TABS Take 2 tablets by mouth daily.    . Cholecalciferol (VITAMIN D-3) 5000 UNITS TABS Take 5,000 Units by mouth daily.    Marland Kitchen escitalopram (LEXAPRO) 20 MG tablet TAKE 1 TABLET BY MOUTH  DAILY 90 tablet 0  . fenofibrate 160 MG tablet TAKE 1 TABLET BY MOUTH  DAILY 90 tablet 0  . fluticasone (FLONASE) 50 MCG/ACT nasal spray Use 2 sprays in each  nostril daily 48 g 2  . irbesartan-hydrochlorothiazide (AVALIDE) 150-12.5 MG tablet Take 1 tablet by mouth daily. To replace Valsartan- HCTZ 90 tablet 1  . montelukast (SINGULAIR) 10 MG tablet TAKE 1 TABLET BY MOUTH  DAILY 90 tablet 3  . mupirocin ointment (BACTROBAN) 2 % Apply 1 application topically 2 (two) times daily. 22 g 3  . omeprazole (PRILOSEC) 40  MG capsule TAKE 1 CAPSULE BY MOUTH  DAILY 90 capsule 0  . PROAIR HFA 108 (90 BASE) MCG/ACT inhaler Use 2 puffs into the lungs  every 6 hours as needed for wheezing or shortness of  breath 25.5 g 2  . promethazine-dextromethorphan (PROMETHAZINE-DM) 6.25-15 MG/5ML syrup Take 5 mLs by mouth 4 (four) times daily as needed for cough. 180 mL 0  . SYMBICORT 160-4.5 MCG/ACT inhaler Use 2 puffs two times daily 30.6 g 2  . valACYclovir (VALTREX) 1000 MG tablet 2 po BID for 1 day at fever blister onset. May repeat if reoccurs 12 tablet 0   No current facility-administered medications for this visit.   Allergies:  Statins, E-mycin [erythromycin], Estrogens, Sulfa antibiotics, and Vioxx [rofecoxib]   Social History: The patient  reports that she has never smoked. She has never used smokeless tobacco. She reports that she does not drink alcohol and does not use drugs.   Family History: The patient's family history includes COPD in her brother, brother, sister, and sister; Emphysema in her father; Heart disease in her brother and mother; Heart failure in her sister; Hip fracture in her father; Hypertension in her brother; Lymphoma in her sister; Ovarian cancer in her sister; Parkinsonism in her father.   ROS: No syncope.  Physical Exam: VS:  BP 130/66 (BP Location: Left Arm)   Pulse 64   Ht 5' (1.524 m)   Wt 171 lb (77.6 kg)   SpO2 97%   BMI 33.40 kg/m , BMI Body mass index is 33.4 kg/m.  Wt Readings from Last 3 Encounters:  03/30/21 171 lb (77.6 kg)  02/05/21 176 lb (79.8 kg)  01/21/21 174 lb (78.9 kg)    General: Patient appears comfortable at rest. HEENT: Conjunctiva and lids normal, wearing a mask. Neck: Supple, no elevated JVP or carotid bruits, no thyromegaly. Lungs: Clear to auscultation, nonlabored breathing at rest. Cardiac: Regular rate and rhythm, no S3, 2-3/6 systolic murmur, no pericardial rub. Abdomen: Soft, nontender, bowel sounds present. Extremities: No pitting edema.  ECG:   An ECG dated 01/21/2021 was personally reviewed today and demonstrated:  Sinus bradycardia with nonspecific T wave changes.  Recent Labwork: 05/27/2020: Hemoglobin 12.3; Platelets 299 01/21/2021: ALT 14; AST 14; BUN 20; Creatinine, Ser 0.79; Magnesium 2.0; Potassium 4.3; Sodium 143; TSH 2.290     Component Value Date/Time   CHOL 160 10/30/2019 1035   CHOL 208 (H) 05/09/2013 1052   TRIG 194 (H) 10/30/2019 1035   TRIG 356 (H) 01/09/2016 1445   TRIG 325 (H) 05/09/2013 1052   HDL 34 (L) 10/30/2019 1035   HDL 30 (L) 01/09/2016 1445   HDL 34 (L) 05/09/2013 1052  CHOLHDL 4.7 (H) 10/30/2019 1035   LDLCALC 93 10/30/2019 1035   LDLCALC NOTES: 10/11/2013 0909   LDLCALC 109 (H) 05/09/2013 1052    Other Studies Reviewed Today:  Echocardiogram 12/29/2017: - Left ventricle: The cavity size was normal. Wall thickness was  normal. Systolic function was vigorous. The estimated ejection  fraction was in the range of 65% to 70%. Wall motion was normal;  there were no regional wall motion abnormalities. Left  ventricular diastolic function parameters were normal for the  patient&'s age.  - Aortic valve: Mildly calcified annulus. Probably trileaflet. Peak  gradient (S): 18 mm Hg. Valve area (Vmax): 4.18 cm^2.  - Mitral valve: Mildly calcified annulus. There was trivial  regurgitation.  - Left atrium: The atrium was at the upper limits of normal in  size.  - Right atrium: Central venous pressure (est): 3 mm Hg.  - Atrial septum: No defect or patent foramen ovale was identified.  - Tricuspid valve: There was trivial regurgitation.  - Pulmonary arteries: PA peak pressure: 30 mm Hg (S).  - Pericardium, extracardiac: A prominent pericardial fat pad was  present.   Impressions:   - Normal LV wall thickness with LVEF 65-70% and grossly normal  diastolic function. Upper normal left atrial chamber size. Mildly  calcified mitral valve with trivial mitral regurgitation. Mildly   sclerotic aortic valve. Trivial tricuspid regurgitation with PASP  estimated 30 mmHg. Prominent pericardial fat pad.   Assessment and Plan:  1.  Intermittently documented bradycardia as discussed above, not clearly symptomatic.  ECG from February showed sinus bradycardia in the 50s with nonspecific T wave changes.  TSH normal.  Plan to obtain a 72-hour Zio patch to better investigate heart rate variability.  Unless she has pauses or significant prolonged episodes of bradycardia, no further work-up will be necessary at this time.  2.  Cardiac murmur and prior documented aortic sclerosis without stenosis as of 2019.  Follow-up echocardiogram will be obtained.  3.  Essential hypertension, systolic 130 today.  She is on Norvasc and Avalide.  Medication Adjustments/Labs and Tests Ordered: Current medicines are reviewed at length with the patient today.  Concerns regarding medicines are outlined above.   Tests Ordered: Orders Placed This Encounter  Procedures  . ECHOCARDIOGRAM COMPLETE    Medication Changes: No orders of the defined types were placed in this encounter.   Disposition:  Follow up test results.  Signed, Jonelle Sidle, MD, [facility] 03/30/2021 10:59 AM    [facility] [address] [place], [place], [place] [address] Phone: [phone]; Fax: [phone]

## 2021-03-30 NOTE — Patient Instructions (Signed)
Medication Instructions:  Continue all current medications.  Labwork: none  Testing/Procedures:  Your physician has recommended that you wear a 3 day event monitor. Event monitors are medical devices that record the heart's electrical activity. Doctors most often [place] these monitors to diagnose arrhythmias. Arrhythmias are problems with the speed or rhythm of the heartbeat. The monitor is a small, portable device. You can wear one while you do your normal daily activities. This is usually used to diagnose what is causing palpitations/syncope (passing out).  Your physician has requested that you have an echocardiogram. Echocardiography is a painless test that uses sound waves to create images of your heart. It provides your doctor with information about the size and shape of your heart and how well your heart's chambers and valves are working. This procedure takes approximately one hour. There are no restrictions for this procedure.  Office will contact with results via phone or letter.    Follow-Up: Pending test results   Any Other Special Instructions Will Be Listed Below (If Applicable).  If you need a refill on your cardiac medications before your next appointment, please call your pharmacy.

## 2021-04-06 DIAGNOSIS — R001 Bradycardia, unspecified: Secondary | ICD-10-CM | POA: Diagnosis not present

## 2021-04-07 ENCOUNTER — Telehealth: Payer: Self-pay | Admitting: *Deleted

## 2021-04-07 NOTE — Telephone Encounter (Signed)
Patient's husband [name] informed. Copy sent to PCP

## 2021-04-07 NOTE — Telephone Encounter (Signed)
-----   Message from Satira Sark, MD sent at 04/07/2021 10:01 AM EDT ----- Results reviewed.  Overall cardiac rhythm is normal with average heart rate 50 bpm.  She does have slower rates even as low as the high 30s, but this is generally during the early morning hours presumably while she is asleep.  Importantly, there were no pauses or sustained arrhythmias.  She has a known history of OSA on CPAP.  At this point no further rhythm work-up is indicated.  Follow-up on echocardiogram results.

## 2021-04-16 ENCOUNTER — Ambulatory Visit (INDEPENDENT_AMBULATORY_CARE_PROVIDER_SITE_OTHER): Payer: Medicare Other

## 2021-04-16 DIAGNOSIS — R011 Cardiac murmur, unspecified: Secondary | ICD-10-CM

## 2021-04-16 LAB — ECHOCARDIOGRAM COMPLETE
Area-P 1/2: 1.59 cm2
Calc EF: 64 %
MV M vel: 5.36 m/s
MV Peak grad: 114.9 mmHg
S' Lateral: 2.73 cm
Single Plane A2C EF: 62.2 %
Single Plane A4C EF: 68.5 %

## 2021-04-17 ENCOUNTER — Telehealth: Payer: Self-pay | Admitting: *Deleted

## 2021-04-17 NOTE — Telephone Encounter (Signed)
Patient's husband informed. Copy sent to PCP 

## 2021-04-17 NOTE — Telephone Encounter (Signed)
-----   Message from Satira Sark, MD sent at 04/16/2021  7:41 PM EDT ----- Results reviewed.  LVEF vigorous at 65 to 70%, normal RV contraction as well.  Aortic valve is calcified but not stenotic.  No further cardiac work-up at this point.  Would keep follow-up with PCP.

## 2021-04-22 ENCOUNTER — Ambulatory Visit (INDEPENDENT_AMBULATORY_CARE_PROVIDER_SITE_OTHER): Payer: Medicare Other

## 2021-04-22 VITALS — Ht 60.0 in | Wt 170.0 lb

## 2021-04-22 DIAGNOSIS — Z0000 Encounter for general adult medical examination without abnormal findings: Secondary | ICD-10-CM

## 2021-04-22 DIAGNOSIS — Z1211 Encounter for screening for malignant neoplasm of colon: Secondary | ICD-10-CM

## 2021-04-22 DIAGNOSIS — Z1231 Encounter for screening mammogram for malignant neoplasm of breast: Secondary | ICD-10-CM

## 2021-04-22 NOTE — Progress Notes (Signed)
Subjective:   Melissa Bowman is a 70 y.o. female who presents for Medicare Annual (Subsequent) preventive examination.  Virtual Visit via Telephone Note  I connected with  Melissa Bowman on 04/22/21 at  2:00 PM EDT by telephone and verified that I am speaking with the correct person using two identifiers.  Location: Patient: Home Provider: [facility] Persons participating in the virtual visit: patient/Nurse Health Advisor   I discussed the limitations, risks, security and privacy concerns of performing an evaluation and management service by telephone and the availability of in person appointments. The patient expressed understanding and agreed to proceed.  Interactive audio and video telecommunications were attempted between this nurse and patient, however failed, due to patient having technical difficulties OR patient did not have access to video capability.  We continued and completed visit with audio only.  Some vital signs may be absent or patient reported.   Daniela Siebers E Benji Poynter, LPN   Review of Systems     Cardiac Risk Factors include: advanced age (>23men, >58 women);diabetes mellitus;dyslipidemia;obesity (BMI >30kg/m2);sedentary lifestyle;hypertension     Objective:    Today's Vitals   04/22/21 1320  Weight: 170 lb (77.1 kg)  Height: 5' (1.524 m)   Body mass index is 33.2 kg/m.  Advanced Directives 04/22/2021 03/04/2020 02/07/2019 01/19/2018 01/12/2018  Does Patient Have a Medical Advance Directive? No No No No No  Would patient like information on creating a medical advance directive? No - Patient declined No - Patient declined Yes (MAU/Ambulatory/Procedural Areas - Information given) No - Patient declined No - Patient declined    Current Medications (verified) Outpatient Encounter Medications as of 04/22/2021  Medication Sig  . amitriptyline (ELAVIL) 10 MG tablet TAKE 1 TABLET BY MOUTH AT  BEDTIME  . amLODipine (NORVASC) 5 MG tablet TAKE 1 TABLET BY MOUTH  DAILY  .  ARIPiprazole (ABILIFY) 5 MG tablet TAKE 1 TABLET BY MOUTH  DAILY  . Ascorbic Acid (VITAMIN C) 1000 MG tablet Take 1,000 mg by mouth daily.  Marland Kitchen aspirin EC 81 MG tablet Take 1 tablet (81 mg total) by mouth daily.  . Calcium Carb-Cholecalciferol 600-800 MG-UNIT TABS Take 2 tablets by mouth daily.  . Cholecalciferol (VITAMIN D-3) 5000 UNITS TABS Take 5,000 Units by mouth daily.  Marland Kitchen escitalopram (LEXAPRO) 20 MG tablet TAKE 1 TABLET BY MOUTH  DAILY  . fenofibrate 160 MG tablet TAKE 1 TABLET BY MOUTH  DAILY  . fluticasone (FLONASE) 50 MCG/ACT nasal spray Use 2 sprays in each  nostril daily  . irbesartan-hydrochlorothiazide (AVALIDE) 150-12.5 MG tablet Take 1 tablet by mouth daily. To replace Valsartan- HCTZ  . montelukast (SINGULAIR) 10 MG tablet TAKE 1 TABLET BY MOUTH  DAILY  . mupirocin ointment (BACTROBAN) 2 % Apply 1 application topically 2 (two) times daily.  Marland Kitchen omeprazole (PRILOSEC) 40 MG capsule TAKE 1 CAPSULE BY MOUTH  DAILY  . PROAIR HFA 108 (90 BASE) MCG/ACT inhaler Use 2 puffs into the lungs  every 6 hours as needed for wheezing or shortness of  breath  . promethazine-dextromethorphan (PROMETHAZINE-DM) 6.25-15 MG/5ML syrup Take 5 mLs by mouth 4 (four) times daily as needed for cough.  . SYMBICORT 160-4.5 MCG/ACT inhaler Use 2 puffs two times daily  . valACYclovir (VALTREX) 1000 MG tablet 2 po BID for 1 day at fever blister onset. May repeat if reoccurs   No facility-administered encounter medications on file as of 04/22/2021.    Allergies (verified) Statins, E-mycin [erythromycin], Estrogens, Sulfa antibiotics, and Vioxx [rofecoxib]   History: Past  Medical History:  Diagnosis Date  . Allergic rhinitis   . 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 Anxiety and depression   . Asthmatic bronchitis   . Bilateral carpal tunnel syndrome   . Condyloma acuminata   . Diverticulosis   . Eczema   . GERD (gastroesophageal reflux disease)   . H. pylori infection 11/16/1993  . Hiatal hernia   . History of benign  adrenal tumor 2007   [facility]  . History of pneumonia 2007, 2008  . Hyperlipidemia   . Hypertension   . IBS (irritable bowel syndrome)   . Internal hemorrhoids   . Kyphosis   . Memory loss   . Menopause   . Metabolic syndrome   . OSA on CPAP   . Type 2 diabetes mellitus ([place])   . Vitamin D deficiency    Past Surgical History:  Procedure Laterality Date  . abdominal wall lematoma of fabdd hys.    . APPENDECTOMY    . CARPAL TUNNEL RELEASE    . CESAREAN SECTION     [place]    . right breast biopsy  10/96  . ROTATOR CUFF REPAIR  2002   right  . ROTATOR CUFF REPAIR/ bone spur  08/30/09  . SHOULDER ARTHROSCOPY WITH ROTATOR CUFF REPAIR AND SUBACROMIAL DECOMPRESSION Right 01/19/2018   Procedure: REVISION RIGHT MINI OPEN ROTATOR CUFF REPAIR AND SUBACROMIAL DECOMPRESSION;  Surgeon: Susa Day, MD;  Location: [facility];  Service: Orthopedics;  Laterality: Right;  90 MINS  . sleep apnea surgery  5/96  . torn cartiledge rt knee x2  05/13/06   Dr. Charlotta Newton   . TOTAL ABDOMINAL HYSTERECTOMY W/ BILATERAL SALPINGOOPHORECTOMY  8/94   fibroids    Family History  Problem Relation Age of Onset  . Parkinsonism Father   . Emphysema Father   . Hip fracture Father   . Heart disease Mother   . COPD Brother   . Hypertension Brother   . Heart disease Brother   . Heart failure Sister   . Ovarian cancer Sister   . COPD Sister   . Lymphoma Sister   . COPD Sister   . COPD Brother    Social History   Socioeconomic History  . Marital status: Married    Spouse name: [name]  . Number of children: 2  . Years of education: some college  . Highest education level: Some college, no degree  Occupational History  . Occupation: retired    Comment: daycare  Tobacco Use  . Smoking status: Never Smoker  . Smokeless tobacco: Never Used  Vaping Use  . Vaping Use: Never used  Substance and Sexual Activity  . Alcohol use: No  . Drug use: No  . Sexual activity: Not on file  Other  Topics Concern  . Not on file  Social History Narrative   [place] is retired. She lives with her husband [name]. They have two grown daughters that live locally. Sunjai enjoys reading and puzzles.    Social Determinants of Health   Financial Resource Strain: Low Risk   . Difficulty of Paying Living Expenses: Not hard at all  Food Insecurity: No Food Insecurity  . Worried About Charity fundraiser in the Last Year: Never true  . Ran Out of Food in the Last Year: Never true  Transportation Needs: No Transportation Needs  . Lack of Transportation (Medical): No  . Lack of Transportation (Non-Medical): No  Physical Activity: Insufficiently Active  . Days of Exercise  per Week: 7 days  . Minutes of Exercise per Session: 20 min  Stress: No Stress Concern Present  . Feeling of Stress : Only a little  Social Connections: Moderately Isolated  . Frequency of Communication with Friends and Family: More than three times a week  . Frequency of Social Gatherings with Friends and Family: More than three times a week  . Attends Religious Services: Never  . Active Member of Clubs or Organizations: No  . Attends Archivist Meetings: Never  . Marital Status: Married    Tobacco Counseling Counseling given: Not Answered   Clinical Intake:  Pre-visit preparation completed: Yes  Pain : No/denies pain     BMI - recorded: 33.2 Nutritional Status: BMI > 30  Obese Nutritional Risks: None Diabetes: Yes CBG done?: No Did pt. bring in CBG monitor from home?: No  How often do you need to have someone help you when you read instructions, pamphlets, or other written materials from your doctor or pharmacy?: 1 - Never  Nutrition Risk Assessment:  Has the patient had any N/V/D within the last 2 months?  No  Does the patient have any non-healing wounds?  No  Has the patient had any unintentional weight loss or weight gain?  No   Diabetes:  Is the patient diabetic?  Yes  If diabetic, was  a CBG obtained today?  No  Did the patient bring in their glucometer from home?  No  How often do you monitor your CBG's? Never.   Financial Strains and Diabetes Management:  Are you having any financial strains with the device, your supplies or your medication? No .  Does the patient want to be seen by Chronic Care Management for management of their diabetes?  No  Would the patient like to be referred to a Nutritionist or for Diabetic Management?  No   Diabetic Exams:  Diabetic Eye Exam: Completed 01/09/2020. Overdue for diabetic eye exam. Pt has been advised about the importance in completing this exam.  Diabetic Foot Exam: Completed 05/27/2020. Pt has been advised about the importance in completing this exam. Pt is scheduled for diabetic foot exam on 05/01/2021.    Interpreter Needed?: No  Information entered by :: Deliah Strehlow, LPN   Activities of Daily Living In your present state of health, do you have any difficulty performing the following activities: 04/22/2021  Hearing? Y  Vision? N  Difficulty concentrating or making decisions? N  Walking or climbing stairs? N  Dressing or bathing? N  Doing errands, shopping? N  Preparing Food and eating ? N  Using the Toilet? N  In the past six months, have you accidently leaked urine? Y  Comment wears pads for protection  Do you have problems with loss of bowel control? N  Managing your Medications? N  Managing your Finances? N  Housekeeping or managing your Housekeeping? N  Some recent data might be hidden    Patient Care Team: Janora Norlander, DO as PCP - General (Family Medicine) Satira Sark, MD as PCP - Cardiology (Cardiology) Susa Day, MD as Consulting Physician (Orthopedic Surgery)  Indicate any recent Medical Services you may have received from other than [facility] providers in the past year (date may be approximate).     Assessment:   This is a routine wellness examination for Lenoria.  Hearing/Vision  screen  Hearing Screening   125Hz  250Hz  500Hz  1000Hz  2000Hz  3000Hz  4000Hz  6000Hz  8000Hz   Right ear:  Left ear:           Comments: Moderate hearing loss - Has hearing aids but they aren't comfortable and easily fall out, so she doesn't wear them much  Vision Screening Comments: Wears eyeglasses - Annual visits with [facility] in [place] - behind on eye exam - plans to make appt soon  Dietary issues and exercise activities discussed: Current Exercise Habits: Home exercise routine, Type of exercise: walking, Time (Minutes): 20, Frequency (Times/Week): 7, Weekly Exercise (Minutes/Week): 140, Intensity: Mild, Exercise limited by: None identified  Goals Addressed            This Visit's Progress   . Patient Stated   On track    Start eating 3 balanced meals per day and continue daily walking for exercise.       Depression Screen PHQ 2/9 Scores 04/22/2021 01/21/2021 09/01/2020 05/27/2020 03/04/2020 10/30/2019 02/07/2019  PHQ - 2 Score 0 0 3 0 0 0 0  PHQ- 9 Score - 0 6 0 - 0 -    Fall Risk Fall Risk  04/22/2021 01/21/2021 05/27/2020 03/04/2020 10/30/2019  Falls in the past year? 0 0 0 0 0  Number falls in past yr: 0 - - - -  Injury with Fall? 0 - - - -  Comment - - - - -  Risk for fall due to : No Fall Risks - - - -  Follow up Falls prevention discussed - - - -    FALL RISK PREVENTION PERTAINING TO THE HOME:  Any stairs in or around the home? Yes  If so, are there any without handrails? No  Home free of loose throw rugs in walkways, pet beds, electrical cords, etc? Yes  Adequate lighting in your home to reduce risk of falls? Yes   ASSISTIVE DEVICES UTILIZED TO PREVENT FALLS:  Life alert? No  Use of a cane, walker or w/c? No  Grab bars in the bathroom? No  Shower chair or bench in shower? No  Elevated toilet seat or a handicapped toilet? No   TIMED UP AND GO:  Was the test performed? No . Telephonic visit.  Cognitive Function: Normal cognitive status assessed by direct  observation by this Nurse Health Advisor. No abnormalities found.   MMSE - Mini Mental State Exam 02/07/2019  Orientation to time 5  Orientation to Place 5  Registration 3  Attention/ Calculation 5  Recall 3  Language- name 2 objects 2  Language- repeat 1  Language- follow 3 step command 3  Language- read & follow direction 1  Write a sentence 1  Copy design 1  Total score 30     6CIT Screen 03/04/2020  What Year? 0 points  What month? 0 points  What time? 0 points  Count back from 20 0 points  Months in reverse 0 points  Repeat phrase 0 points  Total Score 0    Immunizations Immunization History  Administered Date(s) Administered  . Fluad Quad(high Dose 65+) 10/30/2019, 09/02/2020  . Influenza, High Dose Seasonal PF 10/06/2018  . Influenza,inj,Quad PF,6+ Mos 09/28/2013, 09/27/2014, 10/15/2015, 11/03/2016  . Moderna Sars-Covid-2 Vaccination 02/26/2020, 03/25/2020, 11/04/2020  . Pneumococcal Conjugate-13 05/25/2018  . Pneumococcal Polysaccharide-23 10/30/2019  . Tdap 09/27/2014  . Zoster 01/31/2013  . Zoster Recombinat (Shingrix) 02/07/2019    TDAP status: Up to date  Flu Vaccine status: Up to date  Pneumococcal vaccine status: Up to date  Covid-19 vaccine status: Completed vaccines  Qualifies for Shingles Vaccine? Yes   Zostavax completed Yes  Shingrix Completed?: No.    Education has been provided regarding the importance of this vaccine. Patient has been advised to call insurance company to determine out of pocket expense if they have not yet received this vaccine. Advised may also receive vaccine at local pharmacy or Health Dept. Verbalized acceptance and understanding.  Screening Tests Health Maintenance  Topic Date Due  . DEXA SCAN  05/29/2020  . OPHTHALMOLOGY EXAM  01/08/2021  . Hepatitis C Screening  03/28/2024 (Originally 09/23/1969)  . FOOT EXAM  05/27/2021  . MAMMOGRAM  07/09/2021  . INFLUENZA VACCINE  07/13/2021  . HEMOGLOBIN A1C  07/21/2021  .  COLONOSCOPY (Pts 45-65yrs Insurance coverage will need to be confirmed)  09/14/2021  . TETANUS/TDAP  09/27/2024  . COVID-19 Vaccine  Completed  . PNA vac Low Risk Adult  Completed  . HPV VACCINES  Aged Out    Health Maintenance  Health Maintenance Due  Topic Date Due  . DEXA SCAN  05/29/2020  . OPHTHALMOLOGY EXAM  01/08/2021    Colorectal cancer screening: Referral to GI placed 04/22/21. Pt aware the office will call re: appt.  Mammogram status: Completed 07/09/2020. Repeat every year  Bone Density status: Completed 05/29/2018. Results reflect: Bone density results: OSTEOPENIA. Repeat every 2 years.  Lung Cancer Screening: (Low Dose CT Chest recommended if Age 49-80 years, 30 pack-year currently smoking OR have quit w/in 15years.) does not qualify.   Additional Screening:  Hepatitis C Screening: does qualify; needs this drawn  Vision Screening: Recommended annual ophthalmology exams for early detection of glaucoma and other disorders of the eye. Is the patient up to date with their annual eye exam?  Yes  Who is the provider or what is the name of the office in which the patient attends annual eye exams? [place] If pt is not established with a provider, would they like to be referred to a provider to establish care? No .   Dental Screening: Recommended annual dental exams for proper oral hygiene  Community Resource Referral / Chronic Care Management: CRR required this visit?  No   CCM required this visit?  No      Plan:     I have personally reviewed and noted the following in the patient's chart:   . Medical and social history . Use of alcohol, tobacco or illicit drugs  . Current medications and supplements including opioid prescriptions.  . Functional ability and status . Nutritional status . Physical activity . Advanced directives . List of other physicians . Hospitalizations, surgeries, and ER visits in previous 12 months . Vitals . Screenings to  include cognitive, depression, and falls . Referrals and appointments  In addition, I have reviewed and discussed with patient certain preventive protocols, quality metrics, and best practice recommendations. A written personalized care plan for preventive services as well as general preventive health recommendations were provided to patient.     Sandrea Hammond, LPN   12/14/5850   Nurse Notes: None

## 2021-04-22 NOTE — Patient Instructions (Signed)
Melissa Bowman , Thank you for taking time to come for your Medicare Wellness Visit. I appreciate your ongoing commitment to your health goals. Please review the following plan we discussed and let me know if I can assist you in the future.   Screening recommendations/referrals: Colonoscopy: Done 09/15/2011 - Repeat 10 years - referral sent today Mammogram: Done 07/09/2020 - Repeat every year Bone Density: Done 05/29/2018 - Repeat every 2 years - next office visit Recommended yearly ophthalmology/optometry visit for glaucoma screening and checkup Recommended yearly dental visit for hygiene and checkup  Vaccinations: Influenza vaccine: Done 09/02/2020 - Repeat every fall Pneumococcal vaccine: Done 05/25/2018 & 10/30/2019 Tdap vaccine: Done 09/27/2014 -Repeat 10 years Shingles vaccine: Zostavax done 01/31/13, Shingrix #1 done 02/07/2019 - due for second dose   Covid-19: Done 02/26/20, 03/25/20, & 11/04/20  Advanced directives: Please bring a copy of your health care power of attorney and living will to the office to be added to your chart at your convenience.  Conditions/risks identified: Aim for 30 minutes of exercise or brisk walking each day, drink 6-8 glasses of water and eat lots of fruits and vegetables.  Next appointment: Follow up in one year for your annual wellness visit    Preventive Care 65 Years and Older, Female Preventive care refers to lifestyle choices and visits with your health care provider that can promote health and wellness. What does preventive care include?  A yearly physical exam. This is also called an annual well check.  Dental exams once or twice a year.  Routine eye exams. Ask your health care provider how often you should have your eyes checked.  Personal lifestyle choices, including:  Daily care of your teeth and gums.  Regular physical activity.  Eating a healthy diet.  Avoiding tobacco and drug use.  Limiting alcohol use.  Practicing safe  sex.  Taking low-dose aspirin every day.  Taking vitamin and mineral supplements as recommended by your health care provider. What happens during an annual well check? The services and screenings done by your health care provider during your annual well check will depend on your age, overall health, lifestyle risk factors, and family history of disease. Counseling  Your health care provider may ask you questions about your:  Alcohol use.  Tobacco use.  Drug use.  Emotional well-being.  Home and relationship well-being.  Sexual activity.  Eating habits.  History of falls.  Memory and ability to understand (cognition).  Work and work Statistician.  Reproductive health. Screening  You may have the following tests or measurements:  Height, weight, and BMI.  Blood pressure.  Lipid and cholesterol levels. These may be checked every 5 years, or more frequently if you are over 14 years old.  Skin check.  Lung cancer screening. You may have this screening every year starting at age 73 if you have a 30-pack-year history of smoking and currently smoke or have quit within the past 15 years.  Fecal occult blood test (FOBT) of the stool. You may have this test every year starting at age 66.  Flexible sigmoidoscopy or colonoscopy. You may have a sigmoidoscopy every 5 years or a colonoscopy every 10 years starting at age 30.  Hepatitis C blood test.  Hepatitis B blood test.  Sexually transmitted disease (STD) testing.  Diabetes screening. This is done by checking your blood sugar (glucose) after you have not eaten for a while (fasting). You may have this done every 1-3 years.  Bone density scan. This is done to  screen for osteoporosis. You may have this done starting at age 32.  Mammogram. This may be done every 1-2 years. Talk to your health care provider about how often you should have regular mammograms. Talk with your health care provider about your test results,  treatment options, and if necessary, the need for more tests. Vaccines  Your health care provider may recommend certain vaccines, such as:  Influenza vaccine. This is recommended every year.  Tetanus, diphtheria, and acellular pertussis (Tdap, Td) vaccine. You may need a Td booster every 10 years.  Zoster vaccine. You may need this after age 59.  Pneumococcal 13-valent conjugate (PCV13) vaccine. One dose is recommended after age 59.  Pneumococcal polysaccharide (PPSV23) vaccine. One dose is recommended after age 52. Talk to your health care provider about which screenings and vaccines you need and how often you need them. This information is not intended to replace advice given to you by your health care provider. Make sure you discuss any questions you have with your health care provider. Document Released: 12/26/2015 Document Revised: 08/18/2016 Document Reviewed: 09/30/2015 Elsevier Interactive Patient Education  2017 [employer] Prevention in the Home Falls can cause injuries. They can happen to people of all ages. There are many things you can do to make your home safe and to help prevent falls. What can I do on the outside of my home?  Regularly fix the edges of walkways and driveways and fix any cracks.  Remove anything that might make you trip as you walk through a door, such as a raised step or threshold.  Trim any bushes or trees on the path to your home.  Use bright outdoor lighting.  Clear any walking paths of anything that might make someone trip, such as rocks or tools.  Regularly check to see if handrails are loose or broken. Make sure that both sides of any steps have handrails.  Any raised decks and porches should have guardrails on the edges.  Have any leaves, snow, or ice cleared regularly.  Use sand or salt on walking paths during winter.  Clean up any spills in your garage right away. This includes oil or grease spills. What can I do in the  bathroom?  Use night lights.  Install grab bars by the toilet and in the tub and shower. Do not use towel bars as grab bars.  Use non-skid mats or decals in the tub or shower.  If you need to sit down in the shower, use a plastic, non-slip stool.  Keep the floor dry. Clean up any water that spills on the floor as soon as it happens.  Remove soap buildup in the tub or shower regularly.  Attach bath mats securely with double-sided non-slip rug tape.  Do not have throw rugs and other things on the floor that can make you trip. What can I do in the bedroom?  Use night lights.  Make sure that you have a light by your bed that is easy to reach.  Do not use any sheets or blankets that are too big for your bed. They should not hang down onto the floor.  Have a firm chair that has side arms. You can use this for support while you get dressed.  Do not have throw rugs and other things on the floor that can make you trip. What can I do in the kitchen?  Clean up any spills right away.  Avoid walking on wet floors.  Keep items that  you use a lot in easy-to-reach places.  If you need to reach something above you, use a strong step stool that has a grab bar.  Keep electrical cords out of the way.  Do not use floor polish or wax that makes floors slippery. If you must use wax, use non-skid floor wax.  Do not have throw rugs and other things on the floor that can make you trip. What can I do with my stairs?  Do not leave any items on the stairs.  Make sure that there are handrails on both sides of the stairs and use them. Fix handrails that are broken or loose. Make sure that handrails are as long as the stairways.  Check any carpeting to make sure that it is firmly attached to the stairs. Fix any carpet that is loose or worn.  Avoid having throw rugs at the top or bottom of the stairs. If you do have throw rugs, attach them to the floor with carpet tape.  Make sure that you have a  light switch at the top of the stairs and the bottom of the stairs. If you do not have them, ask someone to add them for you. What else can I do to help prevent falls?  Wear shoes that:  Do not have high heels.  Have rubber bottoms.  Are comfortable and fit you well.  Are closed at the toe. Do not wear sandals.  If you use a stepladder:  Make sure that it is fully opened. Do not climb a closed stepladder.  Make sure that both sides of the stepladder are locked into place.  Ask someone to hold it for you, if possible.  Clearly mark and make sure that you can see:  Any grab bars or handrails.  First and last steps.  Where the edge of each step is.  Use tools that help you move around (mobility aids) if they are needed. These include:  Canes.  Walkers.  Scooters.  Crutches.  Turn on the lights when you go into a dark area. Replace any light bulbs as soon as they burn out.  Set up your furniture so you have a clear path. Avoid moving your furniture around.  If any of your floors are uneven, fix them.  If there are any pets around you, be aware of where they are.  Review your medicines with your doctor. Some medicines can make you feel dizzy. This can increase your chance of falling. Ask your doctor what other things that you can do to help prevent falls. This information is not intended to replace advice given to you by your health care provider. Make sure you discuss any questions you have with your health care provider. Document Released: 09/25/2009 Document Revised: 05/06/2016 Document Reviewed: 01/03/2015 Elsevier Interactive Patient Education  2017 [employer].

## 2021-05-01 ENCOUNTER — Other Ambulatory Visit: Payer: Self-pay

## 2021-05-01 ENCOUNTER — Encounter: Payer: Self-pay | Admitting: Family Medicine

## 2021-05-01 ENCOUNTER — Ambulatory Visit (INDEPENDENT_AMBULATORY_CARE_PROVIDER_SITE_OTHER): Payer: Medicare Other | Admitting: Family Medicine

## 2021-05-01 VITALS — BP 111/69 | HR 63 | Temp 97.5°F | Resp 20 | Ht 60.0 in | Wt 165.0 lb

## 2021-05-01 DIAGNOSIS — M609 Myositis, unspecified: Secondary | ICD-10-CM

## 2021-05-01 DIAGNOSIS — R634 Abnormal weight loss: Secondary | ICD-10-CM | POA: Diagnosis not present

## 2021-05-01 DIAGNOSIS — E1169 Type 2 diabetes mellitus with other specified complication: Secondary | ICD-10-CM

## 2021-05-01 DIAGNOSIS — E1159 Type 2 diabetes mellitus with other circulatory complications: Secondary | ICD-10-CM | POA: Diagnosis not present

## 2021-05-01 DIAGNOSIS — E119 Type 2 diabetes mellitus without complications: Secondary | ICD-10-CM

## 2021-05-01 DIAGNOSIS — F32A Depression, unspecified: Secondary | ICD-10-CM | POA: Diagnosis not present

## 2021-05-01 DIAGNOSIS — E785 Hyperlipidemia, unspecified: Secondary | ICD-10-CM

## 2021-05-01 DIAGNOSIS — I152 Hypertension secondary to endocrine disorders: Secondary | ICD-10-CM

## 2021-05-01 DIAGNOSIS — T466X5A Adverse effect of antihyperlipidemic and antiarteriosclerotic drugs, initial encounter: Secondary | ICD-10-CM

## 2021-05-01 DIAGNOSIS — F419 Anxiety disorder, unspecified: Secondary | ICD-10-CM

## 2021-05-01 LAB — BAYER DCA HB A1C WAIVED: HB A1C (BAYER DCA - WAIVED): 5.5 % (ref <7.0)

## 2021-05-01 MED ORDER — VALACYCLOVIR HCL 1 G PO TABS: ORAL_TABLET | ORAL | 0 refills | Status: DC

## 2021-05-01 NOTE — Patient Instructions (Signed)
Sugar is perfect.  We talked about seeing a psychiatrist for medication assistance at [facility] in [place].  Consider this.  I worry about you taking lexapro, abilify and amitriptyline.  This puts you at high risk of heart rhythm abnormalities AND your symptoms aren't even controlled.  I think they'd be helpful in at least getting you on the right medications.

## 2021-05-01 NOTE — Progress Notes (Signed)
Subjective: CC: Follow-up diabetes PCP: [name], DO Melissa Bowman is a 70 y.o. female presenting to clinic today for:  1. Type 2 Diabetes with hypertension, hyperlipidemia:  Patient with diet-controlled diabetes here for interval follow-up.  She is compliant with amlodipine, Avalide.  She has history of statin induced myalgias and is not on a statin for cholesterol.  She does take fenofibrate however.  No chest pain, shortness of breath.  She does report some weight loss.  She does not feel that she is had any significant changes in diet or exercise.  Denies any night sweats, hemoptysis, hematochezia or melena.  Last eye exam: Needs  Last foot exam: Up-to-date Last A1c:  Lab Results  Component Value Date   HGBA1C 5.5 05/01/2021   Nephropathy screen indicated?:  On ARB Last flu, zoster and/or pneumovax:  Immunization History  Administered Date(s) Administered  . Fluad Quad(high Dose 65+) 10/30/2019, 09/02/2020  . Influenza, High Dose Seasonal PF 10/06/2018  . Influenza,inj,Quad PF,6+ Mos 09/28/2013, 09/27/2014, 10/15/2015, 11/03/2016  . Moderna Sars-Covid-2 Vaccination 02/26/2020, 03/25/2020, 11/04/2020  . Pneumococcal Conjugate-13 05/25/2018  . Pneumococcal Polysaccharide-23 10/30/2019  . Tdap 09/27/2014  . Zoster 01/31/2013  . Zoster Recombinat (Shingrix) 02/07/2019     ROS: Per HPI  Allergies  Allergen Reactions  . Statins Other (See Comments)    myalgias  . E-Mycin [Erythromycin] Other (See Comments)    Medication intolerance stomach cramps  . Estrogens Other (See Comments)    Estrogen replacement Therapy intolerance mood changes  . Sulfa Antibiotics Other (See Comments)    Causes severe stomach cramps   . Vioxx [Rofecoxib] Other (See Comments)    Unknown reaction/interaction. Possible stomach cramps   Past Medical History:  Diagnosis Date  . Allergic rhinitis   . 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 Anxiety and depression   . Asthmatic bronchitis   .  Bilateral carpal tunnel syndrome   . Condyloma acuminata   . Diverticulosis   . Eczema   . GERD (gastroesophageal reflux disease)   . H. pylori infection 11/16/1993  . Hiatal hernia   . History of benign adrenal tumor 2007   [facility]  . History of pneumonia 2007, 2008  . Hyperlipidemia   . Hypertension   . IBS (irritable bowel syndrome)   . Internal hemorrhoids   . Kyphosis   . Memory loss   . Menopause   . Metabolic syndrome   . OSA on CPAP   . Type 2 diabetes mellitus (HCC)   . Vitamin D deficiency     Current Outpatient Medications:  .  amitriptyline (ELAVIL) 10 MG tablet, TAKE 1 TABLET BY MOUTH AT  BEDTIME, Disp: 90 tablet, Rfl: 3 .  amLODipine (NORVASC) 5 MG tablet, TAKE 1 TABLET BY MOUTH  DAILY, Disp: 90 tablet, Rfl: 3 .  ARIPiprazole (ABILIFY) 5 MG tablet, TAKE 1 TABLET BY MOUTH  DAILY, Disp: 90 tablet, Rfl: 3 .  Ascorbic Acid (VITAMIN C) 1000 MG tablet, Take 1,000 mg by mouth daily., Disp: , Rfl:  .  aspirin EC 81 MG tablet, Take 1 tablet (81 mg total) by mouth daily., Disp: , Rfl:  .  Calcium Carb-Cholecalciferol 600-800 MG-UNIT TABS, Take 2 tablets by mouth daily., Disp: , Rfl:  .  Cholecalciferol (VITAMIN D-3) 5000 UNITS TABS, Take 5,000 Units by mouth daily., Disp: , Rfl:  .  escitalopram (LEXAPRO) 20 MG tablet, TAKE 1 TABLET BY MOUTH  DAILY, Disp: 90 tablet, Rfl: 0 .  fenofibrate 160 MG tablet, TAKE  1 TABLET BY MOUTH  DAILY, Disp: 90 tablet, Rfl: 0 .  fluticasone (FLONASE) 50 MCG/ACT nasal spray, Use 2 sprays in each  nostril daily, Disp: 48 g, Rfl: 2 .  irbesartan-hydrochlorothiazide (AVALIDE) 150-12.5 MG tablet, Take 1 tablet by mouth daily. To replace Valsartan- HCTZ, Disp: 90 tablet, Rfl: 1 .  montelukast (SINGULAIR) 10 MG tablet, TAKE 1 TABLET BY MOUTH  DAILY, Disp: 90 tablet, Rfl: 3 .  mupirocin ointment (BACTROBAN) 2 %, Apply 1 application topically 2 (two) times daily., Disp: 22 g, Rfl: 3 .  omeprazole (PRILOSEC) 40 MG capsule, TAKE 1 CAPSULE BY MOUTH  DAILY,  Disp: 90 capsule, Rfl: 0 .  SYMBICORT 160-4.5 MCG/ACT inhaler, Use 2 puffs two times daily, Disp: 30.6 g, Rfl: 2 .  PROAIR HFA 108 (90 BASE) MCG/ACT inhaler, Use 2 puffs into the lungs  every 6 hours as needed for wheezing or shortness of  breath (Patient not taking: Reported on 05/01/2021), Disp: 25.5 g, Rfl: 2 .  valACYclovir (VALTREX) 1000 MG tablet, 2 po BID for 1 day at fever blister onset. May repeat if reoccurs (Patient not taking: Reported on 05/01/2021), Disp: 12 tablet, Rfl: 0 Social History   Socioeconomic History  . Marital status: Married    Spouse name: [name]  . Number of children: 2  . Years of education: some college  . Highest education level: Some college, no degree  Occupational History  . Occupation: retired    Comment: daycare  Tobacco Use  . Smoking status: Never Smoker  . Smokeless tobacco: Never Used  Vaping Use  . Vaping Use: Never used  Substance and Sexual Activity  . Alcohol use: No  . Drug use: No  . Sexual activity: Not on file  Other Topics Concern  . Not on file  Social History Narrative   Melissa Bowman is retired. She lives with her husband [name]. They have two grown daughters that live locally. Melissa Bowman enjoys reading and puzzles.    Social Determinants of Health   Financial Resource Strain: Low Risk   . Difficulty of Paying Living Expenses: Not hard at all  Food Insecurity: No Food Insecurity  . Worried About Charity fundraiser in the Last Year: Never true  . Ran Out of Food in the Last Year: Never true  Transportation Needs: No Transportation Needs  . Lack of Transportation (Medical): No  . Lack of Transportation (Non-Medical): No  Physical Activity: Insufficiently Active  . Days of Exercise per Week: 7 days  . Minutes of Exercise per Session: 20 min  Stress: No Stress Concern Present  . Feeling of Stress : Only a little  Social Connections: Moderately Isolated  . Frequency of Communication with Friends and Family: More than three times a week   . Frequency of Social Gatherings with Friends and Family: More than three times a week  . Attends Religious Services: Never  . Active Member of Clubs or Organizations: No  . Attends Archivist Meetings: Never  . Marital Status: Married  Human resources officer Violence: Not At Risk  . Fear of Current or Ex-Partner: No  . Emotionally Abused: No  . Physically Abused: No  . Sexually Abused: No   Family History  Problem Relation Age of Onset  . Parkinsonism Father   . Emphysema Father   . Hip fracture Father   . Heart disease Mother   . COPD Brother   . Hypertension Brother   . Heart disease Brother   . Heart failure Sister   .  Ovarian cancer Sister   . COPD Sister   . Lymphoma Sister   . COPD Sister   . COPD Brother     Objective: Office vital signs reviewed. Resp 20   Ht 5' (1.524 m)   Wt 165 lb (74.8 kg)   BMI 32.22 kg/m   Physical Examination:  General: Awake, alert, nontoxic, No acute distress Cardio: regular rate and rhythm, S1S2 heard, no murmurs appreciated Pulm: clear to auscultation bilaterally, no wheezes, rhonchi or rales; normal work of breathing on room air GI: soft, non-tender, non-distended, bowel sounds present x4, no hepatomegaly, no splenomegaly, no masses Psych: Affect flat  Assessment/ Plan: 70 y.o. female   Diet-controlled diabetes mellitus ([place]) - Plan: Lipid panel, CMP14+EGFR, Bayer DCA Hb A1c Waived, CANCELED: Bayer DCA Hb A1c Waived  Hyperlipidemia associated with type 2 diabetes mellitus (HCC)  Statin-induced myositis  Hypertension associated with diabetes ([place])  Weight loss  Anxiety and depression  Sugars well controlled.  Unable to tolerate statin.  Continue fenofibrate  Blood pressure well controlled.  No changes  I am concerned about her weight loss.  She was 174 pounds when she saw me in February and sent down to 165 pounds today.  No apparent etiology.  She has referral into GI already in place but may need to see if we  can get this expedited given weight loss.  Will [facility] [employer].  May need new referral for weight loss specifically  She was concerned about insomnia and ongoing anxiety and depressive symptoms.  She is treated with Abilify, Lexapro and Elavil.  I do think this medication regimen is extremely high risk and I am going to see if Almyra Free might be able to take a look at her meds and see if perhaps we can make things a little more concise for her.  I would favor getting rid of the amitriptyline and replacing it with some type of other sleep aid.  I did talk to the patient about consideration for psychiatric evaluation and would [facility]  I think that she would have a good experience there.  No orders of the defined types were placed in this encounter.  No orders of the defined types were placed in this encounter.    Janora Norlander, DO [place] [phone]

## 2021-05-02 LAB — CMP14+EGFR
ALT: 15 IU/L (ref 0–32)
AST: 10 IU/L (ref 0–40)
Albumin/Globulin Ratio: 2.4 — ABNORMAL HIGH (ref 1.2–2.2)
Albumin: 4.6 g/dL (ref 3.8–4.8)
Alkaline Phosphatase: 44 IU/L (ref 44–121)
BUN/Creatinine Ratio: 27 (ref 12–28)
BUN: 20 mg/dL (ref 8–27)
Bilirubin Total: 0.3 mg/dL (ref 0.0–1.2)
CO2: 24 mmol/L (ref 20–29)
Calcium: 9.7 mg/dL (ref 8.7–10.3)
Chloride: 102 mmol/L (ref 96–106)
Creatinine, Ser: 0.73 mg/dL (ref 0.57–1.00)
Globulin, Total: 1.9 g/dL (ref 1.5–4.5)
Glucose: 102 mg/dL — ABNORMAL HIGH (ref 65–99)
Potassium: 3.8 mmol/L (ref 3.5–5.2)
Sodium: 142 mmol/L (ref 134–144)
Total Protein: 6.5 g/dL (ref 6.0–8.5)
eGFR: 88 mL/min/{1.73_m2} (ref >59)

## 2021-05-02 LAB — LIPID PANEL
Chol/HDL Ratio: 4.4 ratio (ref 0.0–4.4)
Cholesterol, Total: 144 mg/dL (ref 100–199)
HDL: 33 mg/dL — ABNORMAL LOW (ref >39)
LDL Chol Calc (NIH): 83 mg/dL (ref 0–99)
Triglycerides: 163 mg/dL — ABNORMAL HIGH (ref 0–149)
VLDL Cholesterol Cal: 28 mg/dL (ref 5–40)

## 2021-05-06 ENCOUNTER — Other Ambulatory Visit: Payer: Medicare Other

## 2021-05-20 ENCOUNTER — Other Ambulatory Visit: Payer: Self-pay | Admitting: Family Medicine

## 2021-05-26 ENCOUNTER — Ambulatory Visit: Payer: Medicare Other | Admitting: Neurology

## 2021-06-02 ENCOUNTER — Other Ambulatory Visit: Payer: Self-pay | Admitting: Family Medicine

## 2021-06-08 ENCOUNTER — Ambulatory Visit: Payer: Self-pay | Admitting: Neurology

## 2021-06-18 ENCOUNTER — Encounter: Payer: Self-pay | Admitting: Family Medicine

## 2021-06-19 ENCOUNTER — Ambulatory Visit: Payer: Medicare Other | Admitting: Gastroenterology

## 2021-06-19 ENCOUNTER — Other Ambulatory Visit (INDEPENDENT_AMBULATORY_CARE_PROVIDER_SITE_OTHER): Payer: Medicare Other

## 2021-06-19 ENCOUNTER — Encounter: Payer: Self-pay | Admitting: Gastroenterology

## 2021-06-19 VITALS — BP 118/72 | HR 57 | Ht 60.0 in | Wt 162.1 lb

## 2021-06-19 DIAGNOSIS — R634 Abnormal weight loss: Secondary | ICD-10-CM

## 2021-06-19 LAB — COMPREHENSIVE METABOLIC PANEL
ALT: 14 U/L (ref 0–35)
AST: 15 U/L (ref 0–37)
Albumin: 4.7 g/dL (ref 3.5–5.2)
Alkaline Phosphatase: 38 U/L — ABNORMAL LOW (ref 39–117)
BUN: 24 mg/dL — ABNORMAL HIGH (ref 6–23)
CO2: 28 mEq/L (ref 19–32)
Calcium: 10 mg/dL (ref 8.4–10.5)
Chloride: 103 mEq/L (ref 96–112)
Creatinine, Ser: 0.78 mg/dL (ref 0.40–1.20)
GFR: 76.78 mL/min (ref >60.00)
Glucose, Bld: 96 mg/dL (ref 70–99)
Potassium: 3.8 mEq/L (ref 3.5–5.1)
Sodium: 139 mEq/L (ref 135–145)
Total Bilirubin: 0.4 mg/dL (ref 0.2–1.2)
Total Protein: 7.4 g/dL (ref 6.0–8.3)

## 2021-06-19 LAB — CBC WITH DIFFERENTIAL/PLATELET
Basophils Absolute: 0.1 10*3/uL (ref 0.0–0.1)
Basophils Relative: 0.7 % (ref 0.0–3.0)
Eosinophils Absolute: 0.1 10*3/uL (ref 0.0–0.7)
Eosinophils Relative: 1.5 % (ref 0.0–5.0)
HCT: 37.5 % (ref 36.0–46.0)
Hemoglobin: 13.3 g/dL (ref 12.0–15.0)
Lymphocytes Relative: 49.2 % — ABNORMAL HIGH (ref 12.0–46.0)
Lymphs Abs: 4.2 10*3/uL — ABNORMAL HIGH (ref 0.7–4.0)
MCHC: 35.4 g/dL (ref 30.0–36.0)
MCV: 85.1 fl (ref 78.0–100.0)
Monocytes Absolute: 0.4 10*3/uL (ref 0.1–1.0)
Monocytes Relative: 4.7 % (ref 3.0–12.0)
Neutro Abs: 3.7 10*3/uL (ref 1.4–7.7)
Neutrophils Relative %: 43.9 % (ref 43.0–77.0)
Platelets: 306 10*3/uL (ref 150.0–400.0)
RBC: 4.41 Mil/uL (ref 3.87–5.11)
RDW: 13.8 % (ref 11.5–15.5)
WBC: 8.5 10*3/uL (ref 4.0–10.5)

## 2021-06-19 MED ORDER — NA SULFATE-K SULFATE-MG SULF 17.5-3.13-1.6 GM/177ML PO SOLN: 1.0000 | Freq: Once | ORAL | 0 refills | Status: AC

## 2021-06-19 NOTE — Patient Instructions (Addendum)
If you are age 71 or older, your body mass index should be between 23-30. Your Body mass index is 31.66 kg/m. If this is out of the aforementioned range listed, please consider follow up with your Primary Care Provider.  If you are age 45 or younger, your body mass index should be between 19-25. Your Body mass index is 31.66 kg/m. If this is out of the aformentioned range listed, please consider follow up with your Primary Care Provider.   Your provider has requested that you go to the basement level for lab work before leaving today. Press "B" on the elevator. The lab is located at the first door on the left as you exit the elevator.     You have been scheduled for an endoscopy and colonoscopy. Please follow the written instructions given to you at your visit today. Please pick up your prep supplies at the pharmacy within the next 1-3 days. If you use inhalers (even only as needed), please bring them with you on the day of your procedure.    You have been scheduled for a CT scan of the abdomen and pelvis at [facility], 1st floor Radiology. You are scheduled on 06/29/21  at 3:30pm. You should arrive 15 minutes prior to your appointment time for registration.  Please pick up 2 bottles of contrast from [place] at least 3 days prior to your scan. The solution may taste better if refrigerated, but do NOT add ice or any other liquid to this solution. Shake well before drinking.   Please follow the written instructions below on the day of your exam:   1) Do not eat anything after 11am (4 hours prior to your test)   2) Drink 1 bottle of contrast @ 1:30pm (2 hours prior to your exam)  Remember to shake well before drinking and do NOT pour over ice.     Drink 1 bottle of contrast @ 2:30pm (1 hour prior to your exam)   You may take any medications as prescribed with a small amount of water, if necessary. If you take any of the following medications: METFORMIN, GLUCOPHAGE, GLUCOVANCE,  AVANDAMET, RIOMET, FORTAMET, [place] MET, JANUMET, GLUMETZA or METAGLIP, you MAY be asked to HOLD this medication 48 hours AFTER the exam.   The purpose of you drinking the oral contrast is to aid in the visualization of your intestinal tract. The contrast solution may cause some diarrhea. Depending on your individual set of symptoms, you may also receive an intravenous injection of x-ray contrast/dye. Plan on being at [facility] for 45 minutes or longer, depending on the type of exam you are having performed.   If you have any questions regarding your exam or if you need to reschedule, you may call [facility] at [phone] between the hours of 8:00 am and 5:00 pm, Monday-Friday.   The [facility] providers would like to encourage you to use [facility] to communicate with providers for non-urgent requests or questions.  Due to long hold times on the telephone, sending your provider a message by [facility] may be a faster and more efficient way to get a response.  Please allow 48 business hours for a response.  Please remember that this is for non-urgent requests.    It was a pleasure to see you today!  Thank you for trusting me with your gastrointestinal care!     Scott E. Cunnigham

## 2021-06-19 NOTE — Progress Notes (Signed)
HPI : Melissa Bowman is a pleasant 70 year old female who was referred from Dr. Lajuana Ripple for further evaluation of unintentional weight loss.  She is accompanied by her husband who helps provide some of the medical history.  Patient states that she has lost over 30 pounds in the last 6 months.  She denies any prior history of significant weight fluctuations or unintentional weight loss.  She denies any changes in her diet.  She denies any changes in her appetite and reports that she is eating essentially the same foods in the same amounts that she always has.  She also denies any abdominal pain, nausea or vomiting or changes in her bowel habits.  Specifically she denies any diarrhea or blood in her stool.  Periodically, she will deal with constipation but this is not new for her.  She has chronic GERD and has been on omeprazole for many years.  She states that as long as she takes the omeprazole, her symptoms are well controlled.  She had an upper and lower endoscopy done in 2012.  Colonoscopy was notable only for mild diverticulosis and internal hemorrhoids.  The upper endoscopy was notable only for mild antral nodularity and erythema, negative for H. Pylori.   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 pylori infection 11/16/1993   Hiatal hernia    History of benign adrenal tumor 2007   [facility]   History of pneumonia 2007, 2008   Hyperlipidemia    Hypertension    IBS (irritable bowel syndrome)    Internal hemorrhoids    Kyphosis    Memory loss    Menopause    Metabolic syndrome    OSA on CPAP    Type 2 diabetes mellitus ([place])    Vitamin D deficiency      Past Surgical History:  Procedure Laterality Date   abdominal wall lematoma of fabdd hys.     APPENDECTOMY      CARPAL TUNNEL RELEASE     CESAREAN SECTION     X2   COLONOSCOPY     INCISIONAL HERNIA REPAIR     right breast biopsy  09/1995   ROTATOR CUFF REPAIR  2002   right   ROTATOR CUFF REPAIR/ bone spur  08/30/2009   SHOULDER ARTHROSCOPY WITH ROTATOR CUFF REPAIR AND SUBACROMIAL DECOMPRESSION Right 01/19/2018   Procedure: REVISION RIGHT MINI OPEN ROTATOR CUFF REPAIR AND SUBACROMIAL DECOMPRESSION;  Surgeon: Susa Day, MD;  Location: [facility];  Service: Orthopedics;  Laterality: Right;  29 MINS   sleep apnea surgery  04/1995   torn cartiledge rt knee x2  05/13/2006   Dr. Ronnie Derby Smoc    TOTAL ABDOMINAL HYSTERECTOMY W/ BILATERAL SALPINGOOPHORECTOMY  07/1993   fibroids    Family History  Problem Relation Age of Onset   Heart disease Mother    Parkinsonism Father    Emphysema Father    Hip fracture Father    Heart failure Sister    Ovarian cancer Sister    COPD Sister    Lymphoma Sister    COPD Sister    COPD Brother    Hypertension Brother    Heart disease Brother    COPD Brother    Colon cancer Neg  Hx    Esophageal cancer Neg Hx    Pancreatic cancer Neg Hx    Stomach cancer Neg Hx    Social History   Tobacco Use   Smoking status: Never   Smokeless tobacco: Never  Vaping Use   Vaping Use: Never used  Substance Use Topics   Alcohol use: No   Drug use: No   Current Outpatient Medications  Medication Sig Dispense Refill   amitriptyline (ELAVIL) 10 MG tablet TAKE 1 TABLET BY MOUTH AT  BEDTIME 90 tablet 3   amLODipine (NORVASC) 5 MG tablet TAKE 1 TABLET BY MOUTH  DAILY 90 tablet 3   ARIPiprazole (ABILIFY) 5 MG tablet TAKE 1 TABLET BY MOUTH  DAILY 90 tablet 3   Ascorbic Acid (VITAMIN C) 1000 MG tablet Take 1,000 mg by mouth daily.     aspirin EC 81 MG tablet Take 1 tablet (81 mg total) by mouth daily.     Calcium Carb-Cholecalciferol 600-800 MG-UNIT TABS Take 2 tablets by mouth daily.     Cholecalciferol (VITAMIN D-3) 5000 UNITS TABS Take 5,000 Units by mouth daily.      escitalopram (LEXAPRO) 20 MG tablet TAKE 1 TABLET BY MOUTH  DAILY 90 tablet 0   fenofibrate 160 MG tablet TAKE 1 TABLET BY MOUTH  DAILY 90 tablet 0   fluticasone (FLONASE) 50 MCG/ACT nasal spray Use 2 sprays in each  nostril daily 48 g 2   irbesartan-hydrochlorothiazide (AVALIDE) 150-12.5 MG tablet Take 1 tablet by mouth daily. To replace Valsartan- HCTZ 90 tablet 1   montelukast (SINGULAIR) 10 MG tablet TAKE 1 TABLET BY MOUTH  DAILY 90 tablet 3   Na Sulfate-K Sulfate-Mg Sulf 17.5-3.13-1.6 GM/177ML SOLN Take 1 kit by mouth once for 1 dose. 354 mL 0   omeprazole (PRILOSEC) 40 MG capsule TAKE 1 CAPSULE BY MOUTH  DAILY 90 capsule 0   PROAIR HFA 108 (90 BASE) MCG/ACT inhaler Use 2 puffs into the lungs  every 6 hours as needed for wheezing or shortness of  breath 25.5 g 2   SYMBICORT 160-4.5 MCG/ACT inhaler Use 2 puffs two times daily 30.6 g 2   valACYclovir (VALTREX) 1000 MG tablet 2 po BID for 1 day at fever blister onset. May repeat if reoccurs 12 tablet 0   No current facility-administered medications for this visit.   Allergies  Allergen Reactions   Statins Other (See Comments)    myalgias   E-Mycin [Erythromycin] Other (See Comments)    Medication intolerance stomach cramps   Estrogens Other (See Comments)    Estrogen replacement Therapy intolerance mood changes   Sulfa Antibiotics Other (See Comments)    Causes severe stomach cramps    Vioxx [Rofecoxib] Other (See Comments)    Unknown reaction/interaction. Possible stomach cramps     Review of Systems: All systems reviewed and negative except where noted in HPI.    Physical Exam: BP 118/72 (BP Location: Left Arm, Patient Position: Sitting, Cuff Size: Normal)   Pulse (!) 57   Ht 5' (1.524 m)   Wt 162 lb 2 oz (73.5 kg)   SpO2 97%   BMI 31.66 kg/m  Constitutional: Pleasant,well-developed, Caucasian female in no acute distress, accompanied by husband HEENT: Normocephalic and atraumatic. Conjunctivae are normal. No scleral  icterus.  Mallampati 2, tongue tremors noted Cardiovascular: Normal rate, regular rhythm.  Pulmonary/chest: Effort normal and breath sounds normal. No wheezing, rales or rhonchi. Abdominal: Soft, nondistended, nontender. Bowel sounds active throughout. There are no masses palpable. No hepatomegaly.  Ventral hernia present Extremities: no edema Neurological: Alert and oriented to person place and time.  Shuffling gait Skin: Skin is warm and dry. No rashes noted. Psychiatric: Normal mood and affect. Behavior is normal.  CBC    Component Value Date/Time   WBC 7.5 05/27/2020 1710   WBC 6.6 01/12/2018 1338   RBC 4.00 05/27/2020 1710   RBC 4.41 01/12/2018 1338   HGB 12.3 05/27/2020 1710   HCT 35.1 05/27/2020 1710   PLT 299 05/27/2020 1710   MCV 88 05/27/2020 1710   MCH 30.8 05/27/2020 1710   MCH 30.8 01/12/2018 1338   MCHC 35.0 05/27/2020 1710   MCHC 34.1 01/12/2018 1338   RDW 13.7 05/27/2020 1710   LYMPHSABS 3.4 (H) 05/27/2020 1710   EOSABS 0.1 05/27/2020 1710   BASOSABS 0.1 05/27/2020 1710    CMP     Component Value Date/Time   NA 142 05/01/2021 0832   K 3.8 05/01/2021 0832   CL 102 05/01/2021 0832   CO2 24 05/01/2021 0832   GLUCOSE 102 (H) 05/01/2021 0832   GLUCOSE 126 (H) 01/12/2018 1338   BUN 20 05/01/2021 0832   CREATININE 0.73 05/01/2021 0832   CREATININE 0.54 05/09/2013 1052   CALCIUM 9.7 05/01/2021 0832   PROT 6.5 05/01/2021 0832   ALBUMIN 4.6 05/01/2021 0832   AST 10 05/01/2021 0832   ALT 15 05/01/2021 0832   ALKPHOS 44 05/01/2021 0832   BILITOT 0.3 05/01/2021 0832   GFRNONAA 76 01/21/2021 1417   GFRNONAA >89 05/09/2013 1052   GFRAA 88 01/21/2021 1417   GFRAA >89 05/09/2013 1052     ASSESSMENT AND PLAN: 71 year old female with Parkinson's and sleep apnea referred for 30 pound unintentional weight loss without associated changes in diet, appetite or accompanying GI symptoms.  This weight loss may just be age-related, but given significant amount of weight  lost, further evaluation should be performed. There have been no recent medication or diet changes that may explain the weight loss. Obviously, malignancy is high on the differential for unintentional weight loss in a 70 year old patient.    We will obtain a CT abdomen pelvis with contrast and performed upper and lower endoscopy.  If no etiology is found, I think a period of observation is reasonable  Unintentional weight loss: - EGD/colonoscopy, - CT abdomen pelvis with contrast - CBC, CMP  The details, risks (including bleeding, perforation, infection, missed lesions, medication reactions and possible hospitalization or surgery if complications occur), benefits, and alternatives to EGD/colonoscopy with possible biopsy and possible polypectomy were discussed with the patient and she consents to proceed.       Ronnie Doss M, DO

## 2021-06-19 NOTE — Progress Notes (Deleted)
Unintentional weight loss 30#/6 months Excessive gas/bloating. Eating same foods and same amount. Regular bowel movements, no blood, no diarrhea.  No dysphagia.  No medication changes to explain weight loss. No smoking history  Sisters: lung cancer, ovarian (41)  Colonoscopy 2012?  Heartburn well controlled with Prilosec.  Been taking it for years.  Heartburn returns when not taking

## 2021-06-23 ENCOUNTER — Other Ambulatory Visit: Payer: Self-pay | Admitting: Family Medicine

## 2021-06-23 DIAGNOSIS — Z1231 Encounter for screening mammogram for malignant neoplasm of breast: Secondary | ICD-10-CM

## 2021-06-29 ENCOUNTER — Ambulatory Visit (HOSPITAL_COMMUNITY)
Admission: RE | Admit: 2021-06-29 | Discharge: 2021-06-29 | Disposition: A | Discharge status: Home / Self Care | Payer: Medicare Other | Source: Ambulatory Visit | Attending: Gastroenterology | Admitting: Gastroenterology

## 2021-06-29 ENCOUNTER — Other Ambulatory Visit: Payer: Self-pay

## 2021-06-29 DIAGNOSIS — K3189 Other diseases of stomach and duodenum: Secondary | ICD-10-CM | POA: Diagnosis not present

## 2021-06-29 DIAGNOSIS — R634 Abnormal weight loss: Secondary | ICD-10-CM | POA: Insufficient documentation

## 2021-06-29 DIAGNOSIS — I10 Essential (primary) hypertension: Secondary | ICD-10-CM | POA: Diagnosis not present

## 2021-06-29 DIAGNOSIS — K589 Irritable bowel syndrome without diarrhea: Secondary | ICD-10-CM | POA: Diagnosis not present

## 2021-06-29 MED ORDER — IOHEXOL 350 MG/ML SOLN
100.0000 mL | Freq: Once | INTRAVENOUS | Status: AC | PRN
  Administered 2021-06-29: 80 mL via INTRAVENOUS

## 2021-06-30 NOTE — Progress Notes (Signed)
Melissa Bowman,  Your CT scan did not reveal any obvious causes for your weight loss.  There was a slight increase in size (only a few millimeters) of a growth on your left adrenal gland compared to 2016, but I don't think that would explain your weight loss.  We will have more information after your upper and lower endoscopy later this month. Please let us know if you have further questions

## 2021-07-09 ENCOUNTER — Ambulatory Visit (AMBULATORY_SURGERY_CENTER): Payer: Medicare Other | Admitting: Gastroenterology

## 2021-07-09 ENCOUNTER — Encounter: Payer: Self-pay | Admitting: Gastroenterology

## 2021-07-09 ENCOUNTER — Other Ambulatory Visit: Payer: Self-pay

## 2021-07-09 VITALS — BP 147/76 | HR 48 | Temp 98.4°F | Resp 18 | Ht 60.0 in | Wt 162.0 lb

## 2021-07-09 DIAGNOSIS — K317 Polyp of stomach and duodenum: Secondary | ICD-10-CM | POA: Diagnosis not present

## 2021-07-09 DIAGNOSIS — D122 Benign neoplasm of ascending colon: Secondary | ICD-10-CM | POA: Diagnosis not present

## 2021-07-09 DIAGNOSIS — K644 Residual hemorrhoidal skin tags: Secondary | ICD-10-CM | POA: Diagnosis not present

## 2021-07-09 DIAGNOSIS — R634 Abnormal weight loss: Secondary | ICD-10-CM

## 2021-07-09 DIAGNOSIS — R194 Change in bowel habit: Secondary | ICD-10-CM | POA: Diagnosis not present

## 2021-07-09 MED ORDER — SODIUM CHLORIDE 0.9 % IV SOLN
500.0000 mL | Freq: Once | INTRAVENOUS | Status: AC
Start: 2021-07-09 — End: ?

## 2021-07-09 NOTE — Progress Notes (Signed)
Medical history reviewed with no changes noted. VS assessed by C.W 

## 2021-07-09 NOTE — Op Note (Signed)
[facility] Patient Name: Melissa Bowman Procedure Date: 07/09/2021 3:03 PM MRN: [id number] Endoscopist: Lorin Picket E. Tomasa Rand , MD Age: 70 Referring MD:  Date of Birth: March 15, 1951 Gender: Female Account #: [id number] Procedure:                Upper GI endoscopy Indications:              Weight loss Medicines:                Monitored Anesthesia Care Procedure:                Pre-Anesthesia Assessment:                           - Prior to the procedure, a History and Physical                            was performed, and patient medications and                            allergies were reviewed. The patient's tolerance of                            previous anesthesia was also reviewed. The risks                            and benefits of the procedure and the sedation                            options and risks were discussed with the patient.                            All questions were answered, and informed consent                            was obtained. Prior Anticoagulants: The patient has                            taken no previous anticoagulant or antiplatelet                            agents. ASA Grade Assessment: III - A patient with                            severe systemic disease. After reviewing the risks                            and benefits, the patient was deemed in                            satisfactory condition to undergo the procedure.                           After obtaining informed consent, the endoscope was  passed under direct vision. Throughout the                            procedure, the patient's blood pressure, pulse, and                            oxygen saturations were monitored continuously. The                            Endoscope was introduced through the mouth, and                            advanced to the second part of duodenum. The upper                            GI endoscopy was accomplished  without difficulty.                            The patient tolerated the procedure well. Scope In: Scope Out: Findings:                 The examined esophagus was normal.                           Diffuse nodular mucosa was found in the gastric                            fundus and in the gastric body. Biopsies were taken                            with a cold forceps for histology. Estimated blood                            loss was minimal.                           The exam of the stomach was otherwise normal.                           The examined duodenum was normal. Complications:            No immediate complications. Estimated Blood Loss:     Estimated blood loss was minimal. Impression:               - Normal esophagus.                           - Nodular mucosa in the gastric fundus and in the                            gastric body. Biopsied. This is likely related to                            chronic PPI use.                           -  Normal examined duodenum.                           - No abnormalities to explain weight loss. Recommendation:           - Patient has a contact number available for                            emergencies. The signs and symptoms of potential                            delayed complications were discussed with the                            patient. Return to normal activities tomorrow.                            Written discharge instructions were provided to the                            patient.                           - Resume previous diet.                           - Continue present medications.                           - Await pathology results.                           - Follow up with Dr. Vickii Penna E. Tomasa Rand, MD 07/09/2021 3:58:02 PM This report has been signed electronically.

## 2021-07-09 NOTE — Progress Notes (Signed)
Called to room to assist during endoscopic procedure.  Patient ID and intended procedure confirmed with present staff. Received instructions for my participation in the procedure from the performing physician.  

## 2021-07-09 NOTE — Progress Notes (Signed)
pt tolerated well. VSS. awake and to recovery. Report given to RN. Bite block left insitu. No trauma.

## 2021-07-09 NOTE — Patient Instructions (Signed)
Handout provided on polyps.   Follow-up with Dr. Lajuana Ripple.   YOU HAD AN ENDOSCOPIC PROCEDURE TODAY AT [facility]:   Refer to the procedure report that was given to you for any specific questions about what was found during the examination.  If the procedure report does not answer your questions, please call your gastroenterologist to clarify.  If you requested that your care partner not be given the details of your procedure findings, then the procedure report has been included in a sealed envelope for you to review at your convenience later.  YOU SHOULD EXPECT: Some feelings of bloating in the abdomen. Passage of more gas than usual.  Walking can help get rid of the air that was put into your GI tract during the procedure and reduce the bloating. If you had a lower endoscopy (such as a colonoscopy or flexible sigmoidoscopy) you may notice spotting of blood in your stool or on the toilet paper. If you underwent a bowel prep for your procedure, you may not have a normal bowel movement for a few days.  Please Note:  You might notice some irritation and congestion in your nose or some drainage.  This is from the oxygen used during your procedure.  There is no need for concern and it should clear up in a day or so.  SYMPTOMS TO REPORT IMMEDIATELY:  Following lower endoscopy (colonoscopy or flexible sigmoidoscopy):  Excessive amounts of blood in the stool  Significant tenderness or worsening of abdominal pains  Swelling of the abdomen that is new, acute  Fever of 100F or higher  Following upper endoscopy (EGD)  Vomiting of blood or coffee ground material  New chest pain or pain under the shoulder blades  Painful or persistently difficult swallowing  New shortness of breath  Fever of 100F or higher  Black, tarry-looking stools  For urgent or emergent issues, a gastroenterologist can be reached at any hour by calling [phone]. Do not use MyChart messaging for urgent  concerns.    DIET:  We do recommend a small meal at first, but then you may proceed to your regular diet.  Drink plenty of fluids but you should avoid alcoholic beverages for 24 hours.  ACTIVITY:  You should plan to take it easy for the rest of today and you should NOT DRIVE or use heavy machinery until tomorrow (because of the sedation medicines used during the test).    FOLLOW UP: Our staff will call the number listed on your records 48-72 hours following your procedure to check on you and address any questions or concerns that you may have regarding the information given to you following your procedure. If we do not reach you, we will leave a message.  We will attempt to reach you two times.  During this call, we will ask if you have developed any symptoms of COVID 19. If you develop any symptoms (ie: fever, flu-like symptoms, shortness of breath, cough etc.) before then, please call [phone].  If you test positive for Covid 19 in the 2 weeks post procedure, please call and report this information to [place].    If any biopsies were taken you will be contacted by phone or by letter within the next 1-3 weeks.  Please call us at [phone] if you have not heard about the biopsies in 3 weeks.    SIGNATURES/CONFIDENTIALITY: You and/or your care partner have signed paperwork which will be entered into your electronic medical record.  These signatures  attest to the fact that that the information above on your After Visit Summary has been reviewed and is understood.  Full responsibility of the confidentiality of this discharge information lies with you and/or your care-partner.

## 2021-07-09 NOTE — Op Note (Signed)
[facility] Patient Name: Melissa Bowman Procedure Date: 07/09/2021 3:03 PM MRN: [id number] Endoscopist: Lorin Picket E. Tomasa Rand , MD Age: 70 Referring MD:  Date of Birth: 1951/06/29 Gender: Female Account #: [id number] Procedure:                Colonoscopy Indications:              Weight loss Medicines:                Monitored Anesthesia Care Procedure:                Pre-Anesthesia Assessment:                           - Prior to the procedure, a History and Physical                            was performed, and patient medications and                            allergies were reviewed. The patient's tolerance of                            previous anesthesia was also reviewed. The risks                            and benefits of the procedure and the sedation                            options and risks were discussed with the patient.                            All questions were answered, and informed consent                            was obtained. Prior Anticoagulants: The patient has                            taken no previous anticoagulant or antiplatelet                            agents. ASA Grade Assessment: III - A patient with                            severe systemic disease. After reviewing the risks                            and benefits, the patient was deemed in                            satisfactory condition to undergo the procedure.                           After obtaining informed consent, the colonoscope  was passed under direct vision. Throughout the                            procedure, the patient's blood pressure, pulse, and                            oxygen saturations were monitored continuously. The                            Colonoscope was introduced through the anus and                            advanced to the the terminal ileum, with                            identification of the appendiceal orifice and IC                             valve. The colonoscopy was performed with moderate                            difficulty due to significant looping and a                            tortuous colon. Successful completion of the                            procedure was aided by using manual pressure. The                            patient tolerated the procedure well. The quality                            of the bowel preparation was adequate. The quality                            of the bowel preparation was adequate. The terminal                            ileum, ileocecal valve, appendiceal orifice, and                            rectum were photographed. The bowel preparation                            used was MoviPrep via split dose instruction. Scope In: 3:20:12 PM Scope Out: 3:51:55 PM Scope Withdrawal Time: 0 hours 23 minutes 26 seconds  Total Procedure Duration: 0 hours 31 minutes 43 seconds  Findings:                 Skin tags were found on perianal exam.                           The digital rectal exam was  normal. Pertinent                            negatives include normal sphincter tone and no                            palpable rectal lesions.                           Two sessile polyps were found in the ascending                            colon. The polyps were 3 to 5 mm in size. These                            polyps were removed with a cold snare. Resection                            and retrieval were complete. Estimated blood loss                            was minimal.                           The exam was otherwise normal throughout the                            examined colon.                           The terminal ileum appeared normal.                           The retroflexed view of the distal rectum and anal                            verge was normal and showed no anal or rectal                            abnormalities. Complications:            No  immediate complications. Estimated Blood Loss:     Estimated blood loss was minimal. Impression:               - Perianal skin tags found on perianal exam.                           - Two 3 to 5 mm polyps in the ascending colon,                            removed with a cold snare. Resected and retrieved.                           - The examined portion of the ileum was normal.                           -  The distal rectum and anal verge are normal on                            retroflexion view. Recommendation:           - Patient has a contact number available for                            emergencies. The signs and symptoms of potential                            delayed complications were discussed with the                            patient. Return to normal activities tomorrow.                            Written discharge instructions were provided to the                            patient.                           - Resume previous diet.                           - Continue present medications.                           - Await pathology results.                           - Recommendations for surveillance colonoscopy will                            be based on pathology results of resected polyps. Liara Holm E. Tomasa Rand, MD 07/09/2021 4:02:31 PM This report has been signed electronically.

## 2021-07-10 ENCOUNTER — Other Ambulatory Visit: Payer: Self-pay | Admitting: Family Medicine

## 2021-07-13 ENCOUNTER — Telehealth: Payer: Self-pay | Admitting: *Deleted

## 2021-07-13 NOTE — Telephone Encounter (Signed)
Attempted f/u phone call. No answer. Left message. °

## 2021-07-13 NOTE — Telephone Encounter (Signed)
  Follow up Call-  Call back number 07/09/2021  Post procedure Call Back phone  # [phone]  Permission to leave phone message Yes  Some recent data might be hidden    LMOM to call back with any questions or concerns.  Also, call back if patient has developed fever, respiratory issues or been dx with COVID or had any family members or close contacts diagnosed since her procedure.

## 2021-07-15 ENCOUNTER — Encounter: Payer: Self-pay | Admitting: Gastroenterology

## 2021-07-22 ENCOUNTER — Other Ambulatory Visit: Payer: Self-pay | Admitting: Family Medicine

## 2021-08-03 ENCOUNTER — Other Ambulatory Visit: Payer: Self-pay

## 2021-08-03 ENCOUNTER — Encounter: Payer: Self-pay | Admitting: Family Medicine

## 2021-08-03 ENCOUNTER — Ambulatory Visit (INDEPENDENT_AMBULATORY_CARE_PROVIDER_SITE_OTHER): Payer: Medicare Other | Admitting: Family Medicine

## 2021-08-03 VITALS — BP 145/81 | HR 103 | Temp 97.8°F | Ht 60.0 in | Wt 161.0 lb

## 2021-08-03 DIAGNOSIS — F419 Anxiety disorder, unspecified: Secondary | ICD-10-CM

## 2021-08-03 DIAGNOSIS — E119 Type 2 diabetes mellitus without complications: Secondary | ICD-10-CM | POA: Diagnosis not present

## 2021-08-03 DIAGNOSIS — R634 Abnormal weight loss: Secondary | ICD-10-CM

## 2021-08-03 DIAGNOSIS — F32A Depression, unspecified: Secondary | ICD-10-CM | POA: Diagnosis not present

## 2021-08-03 DIAGNOSIS — Z23 Encounter for immunization: Secondary | ICD-10-CM

## 2021-08-03 DIAGNOSIS — E1159 Type 2 diabetes mellitus with other circulatory complications: Secondary | ICD-10-CM | POA: Diagnosis not present

## 2021-08-03 DIAGNOSIS — I152 Hypertension secondary to endocrine disorders: Secondary | ICD-10-CM

## 2021-08-03 LAB — BAYER DCA HB A1C WAIVED: HB A1C (BAYER DCA - WAIVED): 5.2 % (ref <7.0)

## 2021-08-03 NOTE — Patient Instructions (Addendum)
Diabetes remains under excellent control. A1c 5.2 today  I am glad to hear you got a good report from the Gastric doctor.  Make sure to eat a balanced diet.  Mediterranean Diet A Mediterranean diet refers to food and lifestyle choices that are based on the traditions of countries located on the [employer]. This way of eating has been shown to help prevent certain conditions and improve outcomes forpeople who have chronic diseases, like kidney disease and heart disease. What are tips for following this plan? Lifestyle Cook and eat meals together with your family, when possible. Drink enough fluid to keep your urine clear or pale yellow. Be physically active every day. This includes: Aerobic exercise like running or swimming. Leisure activities like gardening, walking, or housework. Get 7-8 hours of sleep each night. If recommended by your health care provider, drink red wine in moderation. This means 1 glass a day for nonpregnant women and 2 glasses a day for men. A glass of wine equals 5 oz (150 mL). Reading food labels  Check the serving size of packaged foods. For foods such as rice and pasta, the serving size refers to the amount of cooked product, not dry. Check the total fat in packaged foods. Avoid foods that have saturated fat or trans fats. Check the ingredients list for added sugars, such as corn syrup.  Shopping At the grocery store, buy most of your food from the areas near the walls of the store. This includes: Fresh fruits and vegetables (produce). Grains, beans, nuts, and seeds. Some of these may be available in unpackaged forms or large amounts (in bulk). Fresh seafood. Poultry and eggs. Low-fat dairy products. Buy whole ingredients instead of prepackaged foods. Buy fresh fruits and vegetables in-season from local farmers markets. Buy frozen fruits and vegetables in resealable bags. If you do not have access to quality fresh seafood, buy precooked frozen shrimp  or canned fish, such as tuna, salmon, or sardines. Buy small amounts of raw or cooked vegetables, salads, or olives from the deli or salad bar at your store. Stock your pantry so you always have certain foods on hand, such as olive oil, canned tuna, canned tomatoes, rice, pasta, and beans. Cooking Cook foods with extra-virgin olive oil instead of using butter or other vegetable oils. Have meat as a side dish, and have vegetables or grains as your main dish. This means having meat in small portions or adding small amounts of meat to foods like pasta or stew. Use beans or vegetables instead of meat in common dishes like chili or lasagna. Experiment with different cooking methods. Try roasting or broiling vegetables instead of steaming or sauteing them. Add frozen vegetables to soups, stews, pasta, or rice. Add nuts or seeds for added healthy fat at each meal. You can add these to yogurt, salads, or vegetable dishes. Marinate fish or vegetables using olive oil, lemon juice, garlic, and fresh herbs. Meal planning  Plan to eat 1 vegetarian meal one day each week. Try to work up to 2 vegetarian meals, if possible. Eat seafood 2 or more times a week. Have healthy snacks readily available, such as: Vegetable sticks with hummus. Greek yogurt. Fruit and nut trail mix. Eat balanced meals throughout the week. This includes: Fruit: 2-3 servings a day Vegetables: 4-5 servings a day Low-fat dairy: 2 servings a day Fish, poultry, or lean meat: 1 serving a day Beans and legumes: 2 or more servings a week Nuts and seeds: 1-2 servings a day Whole grains:  6-8 servings a day Extra-virgin olive oil: 3-4 servings a day Limit red meat and sweets to only a few servings a month  What are my food choices? Mediterranean diet Recommended Grains: Whole-grain pasta. Brown rice. Bulgar wheat. Polenta. Couscous. Whole-wheat bread. [place]. Vegetables: Artichokes. Beets. Broccoli. Cabbage. Carrots.  Eggplant. Green beans. Chard. Kale. Spinach. Onions. Leeks. Peas. Squash. Tomatoes. Peppers. Radishes. Fruits: Apples. Apricots. Avocado. Berries. Bananas. Cherries. Dates. Figs. Grapes. Lemons. Melon. Oranges. Peaches. Plums. Pomegranate. Meats and other protein foods: Beans. Almonds. Sunflower seeds. Pine nuts. Peanuts. Foard. Salmon. Scallops. Shrimp. [place]. Tilapia. Clams. Oysters. Eggs. Dairy: Low-fat milk. Cheese. Greek yogurt. Beverages: Water. Red wine. Herbal tea. Fats and oils: Extra virgin olive oil. Avocado oil. Grape seed oil. Sweets and desserts: [place] yogurt with honey. Baked apples. Poached pears. Trail mix. Seasoning and other foods: Basil. Cilantro. Coriander. Cumin. Mint. Parsley. Sage. Rosemary. Tarragon. Garlic. Oregano. Thyme. Pepper. Balsalmic vinegar. Tahini. Hummus. Tomato sauce. Olives. Mushrooms. Limit these Grains: Prepackaged pasta or rice dishes. Prepackaged cereal with added sugar. Vegetables: Deep fried potatoes (french fries). Fruits: Fruit canned in syrup. Meats and other protein foods: Beef. Pork. Lamb. Poultry with skin. Hot dogs. Berniece Salines. Dairy: Ice cream. Sour cream. Whole milk. Beverages: Juice. Sugar-sweetened soft drinks. Beer. Liquor and spirits. Fats and oils: Butter. Canola oil. Vegetable oil. Beef fat (tallow). Lard. Sweets and desserts: Cookies. Cakes. Pies. Candy. Seasoning and other foods: Mayonnaise. Premade sauces and marinades. The items listed may not be a complete list. Talk with your dietitian aboutwhat dietary choices are right for you. Summary The Mediterranean diet includes both food and lifestyle choices. Eat a variety of fresh fruits and vegetables, beans, nuts, seeds, and whole grains. Limit the amount of red meat and sweets that you eat. Talk with your health care provider about whether it is safe for you to drink red wine in moderation. This means 1 glass a day for nonpregnant women and 2 glasses a day for men. A glass of wine equals 5  oz (150 mL). This information is not intended to replace advice given to you by your health care provider. Make sure you discuss any questions you have with your healthcare provider. Document Revised: 07/29/2016 Document Reviewed: 07/22/2016 Elsevier Patient Education  [place].

## 2021-08-03 NOTE — Progress Notes (Signed)
Subjective: CC: Weight loss/ GAD/ Depression PCP: Janora Norlander, DO [id number] F Cata is a 70 y.o. female presenting to clinic today for:  1.  Weight loss Patient had evaluation by gastroenterology by EGD and colonoscopy.  She had a couple of polyps but nothing that really was concerning for cancerous processes.  They attributed her weight loss to "getting older".  Overall she feels well.  She has a mammogram coming up at the end of the month.  Denies any depressive or anxiety symptoms.  2.  Diet-controlled diabetes Patient due for foot exam and would like to have this done today.   ROS: Per HPI  Allergies  Allergen Reactions   Statins Other (See Comments)    myalgias   E-Mycin [Erythromycin] Other (See Comments)    Medication intolerance stomach cramps   Estrogens Other (See Comments)    Estrogen replacement Therapy intolerance mood changes   Sulfa Antibiotics Other (See Comments)    Causes severe stomach cramps    Vioxx [Rofecoxib] Other (See Comments)    Unknown reaction/interaction. 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 pylori infection 11/16/1993   Hiatal hernia    History of benign adrenal tumor 2007   [facility]   History of pneumonia 2007, 2008   Hyperlipidemia    Hypertension    IBS (irritable bowel syndrome)    Internal hemorrhoids    Kyphosis    Memory loss    Menopause    Metabolic syndrome    OSA on CPAP    Type 2 diabetes mellitus (HCC)    Vitamin D deficiency     Current Outpatient Medications:    amitriptyline (ELAVIL) 10 MG tablet, TAKE 1 TABLET BY MOUTH AT  BEDTIME, Disp: 90 tablet, Rfl: 3   amLODipine (NORVASC) 5 MG tablet, TAKE 1 TABLET BY MOUTH  DAILY, Disp: 90 tablet, Rfl: 3    ARIPiprazole (ABILIFY) 5 MG tablet, TAKE 1 TABLET BY MOUTH  DAILY, Disp: 90 tablet, Rfl: 3   Ascorbic Acid (VITAMIN C) 1000 MG tablet, Take 1,000 mg by mouth daily., Disp: , Rfl:    aspirin EC 81 MG tablet, Take 1 tablet (81 mg total) by mouth daily., Disp: , Rfl:    Calcium Carb-Cholecalciferol 600-800 MG-UNIT TABS, Take 2 tablets by mouth daily., Disp: , Rfl:    Cholecalciferol (VITAMIN D-3) 5000 UNITS TABS, Take 5,000 Units by mouth daily., Disp: , Rfl:    escitalopram (LEXAPRO) 20 MG tablet, TAKE 1 TABLET BY MOUTH  DAILY, Disp: 90 tablet, Rfl: 0   fenofibrate 160 MG tablet, TAKE 1 TABLET BY MOUTH  DAILY, Disp: 90 tablet, Rfl: 0   fluticasone (FLONASE) 50 MCG/ACT nasal spray, Use 2 sprays in each  nostril daily, Disp: 48 g, Rfl: 2   irbesartan-hydrochlorothiazide (AVALIDE) 150-12.5 MG tablet, Take 1 tablet by mouth daily. To replace Valsartan- HCTZ, Disp: 90 tablet, Rfl: 1   montelukast (SINGULAIR) 10 MG tablet, TAKE 1 TABLET BY MOUTH  DAILY, Disp: 90 tablet, Rfl: 3   omeprazole (PRILOSEC) 40 MG capsule, TAKE 1 CAPSULE BY MOUTH  DAILY, Disp: 90 capsule, Rfl: 0   PROAIR HFA 108 (90 BASE) MCG/ACT inhaler, Use 2 puffs  into the lungs  every 6 hours as needed for wheezing or shortness of  breath, Disp: 25.5 g, Rfl: 2   SYMBICORT 160-4.5 MCG/ACT inhaler, Use 2 puffs two times daily, Disp: 30.6 g, Rfl: 2   valACYclovir (VALTREX) 1000 MG tablet, 2 po BID for 1 day at fever blister onset. May repeat if reoccurs, Disp: 12 tablet, Rfl: 0  Current Facility-Administered Medications:    0.9 %  sodium chloride infusion, 500 mL, Intravenous, Once, Candis Schatz, Gladstone Pih, MD Social History   Socioeconomic History   Marital status: Married    Spouse name: [name]   Number of children: 2   Years of education: some college   Highest education level: Some college, no degree  Occupational History   Occupation: retired    Comment: daycare  Tobacco Use   Smoking status: Never   Smokeless tobacco: Never  IT trainer Use: Never used  Substance and Sexual Activity   Alcohol use: No   Drug use: No   Sexual activity: Not on file  Other Topics Concern   Not on file  Social History Narrative   Jenisis is retired. She lives with her husband [name]. They have two grown daughters that live locally. Scarlet enjoys reading and puzzles.    Social Determinants of Health   Financial Resource Strain: Low Risk    Difficulty of Paying Living Expenses: Not hard at all  Food Insecurity: No Food Insecurity   Worried About Charity fundraiser in the Last Year: Never true   [place] in the Last Year: Never true  Transportation Needs: No Transportation Needs   Lack of Transportation (Medical): No   Lack of Transportation (Non-Medical): No  Physical Activity: Insufficiently Active   Days of Exercise per Week: 7 days   Minutes of Exercise per Session: 20 min  Stress: No Stress Concern Present   Feeling of Stress : Only a little  Social Connections: Moderately Isolated   Frequency of Communication with Friends and Family: More than three times a week   Frequency of Social Gatherings with Friends and Family: More than three times a week   Attends Religious Services: Never   Marine scientist or Organizations: No   Attends Music therapist: Never   Marital Status: Married  Human resources officer Violence: Not At Risk   Fear of Current or Ex-Partner: No   Emotionally Abused: No   Physically Abused: No   Sexually Abused: No   Family History  Problem Relation Age of Onset   Heart disease Mother    Parkinsonism Father    Emphysema Father    Hip fracture Father    Heart failure Sister    Ovarian cancer Sister    COPD Sister    Lymphoma Sister    COPD Sister    COPD Brother    Hypertension Brother    Heart disease Brother    COPD Brother    Colon cancer Neg Hx    Esophageal cancer Neg Hx    Pancreatic cancer Neg Hx    Stomach cancer Neg Hx     Objective: Office vital  signs reviewed. BP (!) 145/81   Pulse (!) 103   Temp 97.8 F (36.6 C)   Ht 5' (1.524 m)   Wt 161 lb (73 kg)   SpO2 98%   BMI 31.44 kg/m   Physical Examination:  General: Awake, alert, nontoxic elderly female, No acute distress HEENT: Normal; sclera white  Cardio: regular rate and rhythm, S1S2 heard, soft systolic murmur appreciated Pulm: clear to auscultation bilaterally, no wheezes, rhonchi or rales; normal work of breathing on room air Skin: dry; intact; no rashes or lesions Neuro: See DM foot Psych: Mood stable, speech normal.  Depression screen [facility] 2/9 08/03/2021 05/01/2021 04/22/2021  Decreased Interest 0 0 0  Down, Depressed, Hopeless 0 0 0  PHQ - 2 Score 0 0 0  Altered sleeping - 0 -  Tired, decreased energy - 0 -  Change in appetite - 0 -  Feeling bad or failure about yourself  - 0 -  Trouble concentrating - 0 -  Moving slowly or fidgety/restless - 0 -  Suicidal thoughts - 0 -  PHQ-9 Score - 0 -  Difficult doing work/chores - Not difficult at all -  Some recent data might be hidden   GAD 7 : Generalized Anxiety Score 08/03/2021 05/01/2021 09/01/2020 05/27/2020  Nervous, Anxious, on Edge 0 0 0 0  Control/stop worrying 0 1 3 0  Worry too much - different things 0 1 3 0  Trouble relaxing 0 1 0 0  Restless 0 0 0 0  Easily annoyed or irritable 0 0 1 0  Afraid - awful might happen 0 0 1 0  Total GAD 7 Score 0 3 8 0  Anxiety Difficulty Not difficult at all Not difficult at all - -   Diabetic Foot Exam - Simple   Simple Foot Form Diabetic Foot exam was performed with the following findings: Yes 08/03/2021  1:03 PM  Visual Inspection No deformities, no ulcerations, no other skin breakdown bilaterally: Yes Sensation Testing Intact to touch and monofilament testing bilaterally: Yes Pulse Check Posterior Tibialis and Dorsalis pulse intact bilaterally: Yes Comments     Assessment/ Plan: 70 y.o. female   Anxiety and depression  Weight loss  Hypertension associated  with diabetes ([place]) - Plan: Bayer DCA Hb A1c Waived  Diet-controlled diabetes mellitus ([place])  Anxiety and depression are stable.  Weight loss does not appear to be secondary to malignancy with normal work-up by gastroenterology recently.  Blood pressure is controlled for age.  A1c demonstrates ongoing diet-controlled diabetes.  Foot exam was performed today  Orders Placed This Encounter  Procedures   Varicella-zoster vaccine IM (Shingrix)   Bayer DCA Hb A1c Waived   No orders of the defined types were placed in this encounter.    Janora Norlander, DO [place] [phone]

## 2021-08-11 ENCOUNTER — Encounter: Payer: Self-pay | Admitting: Neurology

## 2021-08-12 ENCOUNTER — Ambulatory Visit
Admission: RE | Admit: 2021-08-12 | Discharge: 2021-08-12 | Disposition: A | Discharge status: Home / Self Care | Payer: Medicare Other | Source: Ambulatory Visit | Attending: Family Medicine | Admitting: Family Medicine

## 2021-08-12 ENCOUNTER — Other Ambulatory Visit: Payer: Self-pay

## 2021-08-12 DIAGNOSIS — Z1231 Encounter for screening mammogram for malignant neoplasm of breast: Secondary | ICD-10-CM

## 2021-08-12 NOTE — Telephone Encounter (Signed)
I called patient spoke to husband.  They did speak to Caryl Pina and they have an appointment for September 1 at 130 at the DME in [place] on [address].  I relayed that was great.  Please give [place] a call back to get her scheduled for that compliance 31 to 89-day timeframe after she starts using the machine.  He said he would call in 2 weeks.  He stated she was still having times when not attentive, and still losing weight, this is not new, she had has tests.    Just has an update.  He is to call back to make appt.

## 2021-08-13 ENCOUNTER — Other Ambulatory Visit: Payer: Self-pay | Admitting: Family Medicine

## 2021-08-13 DIAGNOSIS — G4733 Obstructive sleep apnea (adult) (pediatric): Secondary | ICD-10-CM | POA: Diagnosis not present

## 2021-08-26 ENCOUNTER — Other Ambulatory Visit: Payer: Self-pay | Admitting: Family Medicine

## 2021-09-12 DIAGNOSIS — G4733 Obstructive sleep apnea (adult) (pediatric): Secondary | ICD-10-CM | POA: Diagnosis not present

## 2021-09-22 DIAGNOSIS — G4733 Obstructive sleep apnea (adult) (pediatric): Secondary | ICD-10-CM | POA: Diagnosis not present

## 2021-10-01 ENCOUNTER — Other Ambulatory Visit: Payer: Self-pay | Admitting: Family Medicine

## 2021-10-13 DIAGNOSIS — G4733 Obstructive sleep apnea (adult) (pediatric): Secondary | ICD-10-CM | POA: Diagnosis not present

## 2021-10-22 ENCOUNTER — Telehealth: Payer: Self-pay | Admitting: Neurology

## 2021-10-22 NOTE — Telephone Encounter (Signed)
R/s initial cpap from dr. Rexene Alberts to Janett Billow, sent mychart msg informing pt of change.

## 2021-10-28 DIAGNOSIS — G4733 Obstructive sleep apnea (adult) (pediatric): Secondary | ICD-10-CM | POA: Diagnosis not present

## 2021-11-03 ENCOUNTER — Ambulatory Visit: Payer: Medicare Other | Admitting: Neurology

## 2021-11-03 ENCOUNTER — Ambulatory Visit: Payer: Medicare Other | Admitting: Adult Health

## 2021-11-03 ENCOUNTER — Encounter: Payer: Self-pay | Admitting: Adult Health

## 2021-11-03 ENCOUNTER — Other Ambulatory Visit: Payer: Self-pay

## 2021-11-03 VITALS — BP 122/68 | HR 69 | Ht 60.0 in | Wt 159.8 lb

## 2021-11-03 DIAGNOSIS — G20C Parkinsonism, unspecified: Secondary | ICD-10-CM

## 2021-11-03 DIAGNOSIS — Z9989 Dependence on other enabling machines and devices: Secondary | ICD-10-CM | POA: Diagnosis not present

## 2021-11-03 DIAGNOSIS — G20 Parkinson's disease: Secondary | ICD-10-CM

## 2021-11-03 DIAGNOSIS — G4700 Insomnia, unspecified: Secondary | ICD-10-CM

## 2021-11-03 DIAGNOSIS — G4733 Obstructive sleep apnea (adult) (pediatric): Secondary | ICD-10-CM

## 2021-11-03 NOTE — Progress Notes (Signed)
[facility] [address] [place]. [place] [address] [phone]       OFFICE FOLLOW UP NOTE  Ms. [name] Date of Birth:  [birth year] Medical Record Number:  [id number]   Primary neurologist: Dr. Frances Furbish Reason for visit: Initial CPAP follow up    SUBJECTIVE:   CHIEF COMPLAINT:  Chief Complaint  Patient presents with   Follow-up    Room 2. Pt here with husband. Pt was having some difficulty with the machine and mask, but doing better with it now.     HPI:   Ms. Malen is a 70 year old right-handed woman with an underlying medical history of vitamin D deficiency, hypertension, hyperlipidemia, irritable bowel syndrome, eczema, reflux disease, diverticulosis, diabetes, anxiety, depression, allergic rhinitis, adrenal tumor, gait disorder, concern for drug-induce parkinsonism, sleep apnea and obesity.  She is followed by Dr. Frances Furbish for sleep apnea (initial eval for apnea 02/05/2021 with known hx of CPAP prior) and prior eval for gait disorder and parkinsonism.  Completed HST 03/16/2021 which showed severe OSA with total AHI 59.6/h and O2 nadir of 81%.  Recommend initiating AutoPap with new machine (prior machine at least 71 years old) and consider possible for my titration study indicated.  Returns today for initial CPAP versus visit.  Review of compliance report shows 30 out of 30 usage days with 30 days greater than 4 hours per 100% compliance with average usage 10 hours and 29 minutes.  Residual AHI 3.5 with mean pressure 8.9 and average leak 3.2.  AutoPap setting: 7-12.5 with ramp pressure 4.  Husband initially in room with patient but went to the bathroom - did not return prior to end of visit.  She reports initially had difficulty with new mask but now tolerating well.  Does report occasional continued insomnia but this is not new nor has worsened.  She is currently on Abilify as well as amitriptyline and Lexapro.  Reports her PCP recommended establishing care  with psychiatry to further manage but she is still debating this.  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 pylori infection 11/16/1993   Hiatal hernia    History of benign adrenal tumor 2007   [facility]   History of pneumonia 2007, 2008   Hyperlipidemia    Hypertension    IBS (irritable bowel syndrome)    Internal hemorrhoids    Kyphosis    Memory loss    Menopause    Metabolic syndrome    OSA on CPAP    Type 2 diabetes mellitus (HCC)    Vitamin D deficiency     PSH:  Past Surgical History:  Procedure Laterality Date   abdominal wall lematoma of fabdd hys.     APPENDECTOMY     CARPAL TUNNEL RELEASE     CESAREAN SECTION     X2   COLONOSCOPY     INCISIONAL HERNIA REPAIR     right breast biopsy  09/1995   ROTATOR CUFF REPAIR  2002   right  ROTATOR CUFF REPAIR/ bone spur  08/30/2009   SHOULDER ARTHROSCOPY WITH ROTATOR CUFF REPAIR AND SUBACROMIAL DECOMPRESSION Right 01/19/2018   Procedure: REVISION RIGHT MINI OPEN ROTATOR CUFF REPAIR AND SUBACROMIAL DECOMPRESSION;  Surgeon: Jene Every, MD;  Location: [facility];  Service: Orthopedics;  Laterality: Right;  90 MINS   sleep apnea surgery  04/1995   torn cartiledge rt knee x2  05/13/2006   Dr. Sherlean Foot [facility]    TOTAL ABDOMINAL HYSTERECTOMY W/ BILATERAL SALPINGOOPHORECTOMY  07/1993   fibroids     Social History:  Social History   Socioeconomic History   Marital status: Married    Spouse name: [name]   Number of children: 2   Years of education: some college   Highest education level: Some college, no degree  Occupational  History   Occupation: retired    Comment: daycare  Tobacco Use   Smoking status: Never   Smokeless tobacco: Never  Vaping Use   Vaping Use: Never used  Substance and Sexual Activity   Alcohol use: No   Drug use: No   Sexual activity: Not on file  Other Topics Concern   Not on file  Social History Narrative   Calyx is retired. She lives with her husband [name]. They have two grown daughters that live locally. Embrey enjoys reading and puzzles.    Social Determinants of Health   Financial Resource Strain: Low Risk    Difficulty of Paying Living Expenses: Not hard at all  Food Insecurity: No Food Insecurity   Worried About Programme researcher, broadcasting/film/video in the Last Year: Never true   Ran Out of Food in the Last Year: Never true  Transportation Needs: No Transportation Needs   Lack of Transportation (Medical): No   Lack of Transportation (Non-Medical): No  Physical Activity: Insufficiently Active   Days of Exercise per Week: 7 days   Minutes of Exercise per Session: 20 min  Stress: No Stress Concern Present   Feeling of Stress : Only a little  Social Connections: Moderately Isolated   Frequency of Communication with Friends and Family: More than three times a week   Frequency of Social Gatherings with Friends and Family: More than three times a week   Attends Religious Services: Never   Database administrator or Organizations: No   Attends Engineer, structural: Never   Marital Status: Married  Catering manager Violence: Not At Risk   Fear of Current or Ex-Partner: No   Emotionally Abused: No   Physically Abused: No   Sexually Abused: No    Family History:  Family History  Problem Relation Age of Onset   Heart disease Mother    Parkinsonism Father    Emphysema Father    Hip fracture Father    Heart failure Sister    Ovarian cancer Sister    COPD Sister    Lymphoma Sister    COPD Sister    COPD Brother    Hypertension Brother    Heart disease Brother    COPD  Brother    Colon cancer Neg Hx    Esophageal cancer Neg Hx    Pancreatic cancer Neg Hx    Stomach cancer Neg Hx    Breast cancer Neg Hx     Medications:   Current Outpatient Medications on File Prior to Visit  Medication Sig Dispense Refill   amitriptyline (ELAVIL) 10 MG tablet TAKE 1 TABLET BY MOUTH AT  BEDTIME 90 tablet 3   amLODipine (NORVASC) 5 MG tablet  TAKE 1 TABLET BY MOUTH  DAILY 90 tablet 3   ARIPiprazole (ABILIFY) 5 MG tablet TAKE 1 TABLET BY MOUTH  DAILY 90 tablet 1   Ascorbic Acid (VITAMIN C) 1000 MG tablet Take 1,000 mg by mouth daily.     aspirin EC 81 MG tablet Take 1 tablet (81 mg total) by mouth daily.     Calcium Carb-Cholecalciferol 600-800 MG-UNIT TABS Take 2 tablets by mouth daily.     Cholecalciferol (VITAMIN D-3) 5000 UNITS TABS Take 5,000 Units by mouth daily.     escitalopram (LEXAPRO) 20 MG tablet TAKE 1 TABLET BY MOUTH  DAILY 90 tablet 1   fenofibrate 160 MG tablet TAKE 1 TABLET BY MOUTH  DAILY 90 tablet 1   fluticasone (FLONASE) 50 MCG/ACT nasal spray Use 2 sprays in each  nostril daily 48 g 2   irbesartan-hydrochlorothiazide (AVALIDE) 150-12.5 MG tablet TAKE 1 TABLET BY MOUTH  DAILY TO REPLACE VALSARTAN- HCTZ 90 tablet 1   montelukast (SINGULAIR) 10 MG tablet TAKE 1 TABLET BY MOUTH  DAILY 90 tablet 3   omeprazole (PRILOSEC) 40 MG capsule TAKE 1 CAPSULE BY MOUTH  DAILY 90 capsule 3   PROAIR HFA 108 (90 BASE) MCG/ACT inhaler Use 2 puffs into the lungs  every 6 hours as needed for wheezing or shortness of  breath 25.5 g 2   valACYclovir (VALTREX) 1000 MG tablet 2 po BID for 1 day at fever blister onset. May repeat if reoccurs 12 tablet 0   Current Facility-Administered Medications on File Prior to Visit  Medication Dose Route Frequency Provider Last Rate Last Admin   0.9 %  sodium chloride infusion  500 mL Intravenous Once Jenel Lucks, MD        Allergies:   Allergies  Allergen Reactions   Statins Other (See Comments)    myalgias   E-Mycin  [Erythromycin] Other (See Comments)    Medication intolerance stomach cramps   Estrogens Other (See Comments)    Estrogen replacement Therapy intolerance mood changes   Sulfa Antibiotics Other (See Comments)    Causes severe stomach cramps    Vioxx [Rofecoxib] Other (See Comments)    Unknown reaction/interaction. Possible stomach cramps      OBJECTIVE:  Physical Exam  Vitals:   11/03/21 0919  BP: 122/68  Pulse: 69  Weight: 159 lb 12.8 oz (72.5 kg)  Height: 5' (1.524 m)   Body mass index is 31.21 kg/m. No results found.  General: well developed, well nourished, very pleasant elderly Caucasian female, seated, in no evident distress Head: head normocephalic and atraumatic.   Neck: supple with no carotid or supraclavicular bruits Cardiovascular: regular rate and rhythm, no murmurs Musculoskeletal: no deformity Skin:  no rash/petichiae Vascular:  Normal pulses all extremities   Neurologic Exam Mental Status: Awake and fully alert. Oriented to place and time. Recent and remote memory intact. Attention span, concentration and fund of knowledge appropriate during visit. Mood and affect appropriate.  Cranial Nerves: Pupils equal, briskly reactive to light. Extraocular movements full without nystagmus. Visual fields full to confrontation.  HOH bilaterally. Facial sensation intact. Face, tongue, palate moves normally and symmetrically.  Motor: Normal bulk and tone. Normal strength in all tested extremity muscles.  Mild postural tremor noted in BUE Sensory.: intact to touch , pinprick , position and vibratory sensation.  Coordination: Rapid alternating movements mildly slowed in all extremities. Finger-to-nose mild slowness bilaterally although no evidence of dysmetria or ataxia Gait and Station: Arises from chair without difficulty. Stance is  slightly hunched. Gait demonstrates short shuffled steps with decreased stride length and step height without use of assistive device Reflexes:  1+ and symmetric. Toes downgoing.         ASSESSMENT/PLAN: CARSHENA SHAIKH is a 70 y.o. year old female  1.  Severe OSA on AutoPap -Excellent compliance with optimal residual AHI -Continue current settings -Continue to follow with DME company for any needed supplies or CPAP related concerns -Encouraged her to establish care with psychiatry for chronic insomnia as well as underlying anxiety/depression  2.  Parkinsonian syndrome -stable -monitored by Dr. Frances Furbish     Follow up in 1 year with Dr. Frances Furbish or call earlier if needed   CC:  PCP: [name], DO    I spent 23 minutes of face-to-face and non-face-to-face time with patient.  This included previsit chart review including review of recent hospitalization, lab review, study review, order entry, electronic health record documentation, patient education and discussion regarding prior HST and receiving new CPAP machine, review and discussion of compliance report, chronic insomnia and answered all other questions to patient satisfaction  Ihor Austin, AGNP-BC  [facility] [address] [place], [place] [address]  Phone [phone] Fax [phone] Note: This document was prepared with digital dictation and possible smart phrase technology. Any transcriptional errors that result from this process are unintentional.

## 2021-11-03 NOTE — Addendum Note (Signed)
Addended by: Frann Rider L on: 11/03/2021 09:56 AM   Modules accepted: Orders

## 2021-11-03 NOTE — Patient Instructions (Signed)
No changes today - continue current treatment with CPAP  Continue to follow with your DME company, [employer], for any needed supplies or CPAP related concerns      Followup in the future with me in 1 year with Dr. Rexene Alberts or call earlier if needed       Thank you for coming to see [place] at [facility]. I hope we have been able to provide you high quality care today.  You may receive a patient satisfaction survey over the next few weeks. We would appreciate your feedback and comments so that we may continue to improve ourselves and the health of our patients.

## 2021-11-04 NOTE — Progress Notes (Signed)
Supply reorder has been sent to [facility].

## 2021-11-12 DIAGNOSIS — G4733 Obstructive sleep apnea (adult) (pediatric): Secondary | ICD-10-CM | POA: Diagnosis not present

## 2021-12-01 ENCOUNTER — Ambulatory Visit (INDEPENDENT_AMBULATORY_CARE_PROVIDER_SITE_OTHER): Payer: Medicare Other | Admitting: Family Medicine

## 2021-12-01 ENCOUNTER — Encounter: Payer: Self-pay | Admitting: Family Medicine

## 2021-12-01 VITALS — BP 105/61 | HR 54 | Temp 97.2°F | Ht 60.0 in | Wt 157.0 lb

## 2021-12-01 DIAGNOSIS — M545 Low back pain, unspecified: Secondary | ICD-10-CM | POA: Diagnosis not present

## 2021-12-01 DIAGNOSIS — Z23 Encounter for immunization: Secondary | ICD-10-CM | POA: Diagnosis not present

## 2021-12-01 MED ORDER — TIZANIDINE HCL 4 MG PO TABS: 4.0000 mg | ORAL_TABLET | Freq: Four times a day (QID) | ORAL | 0 refills | Status: DC | PRN

## 2021-12-01 MED ORDER — LIDOCAINE 5 % EX OINT: 1.0000 "application " | TOPICAL_OINTMENT | CUTANEOUS | 0 refills | Status: DC | PRN

## 2021-12-01 NOTE — Progress Notes (Signed)
Acute Office Visit  Subjective:    Patient ID: Melissa Bowman, female    DOB: 1951-07-11, 70 y.o.   MRN: [id number]  Chief Complaint  Patient presents with   Back Pain    HPI Patient is in today for lower back pain that started 3 days ago. It is on the left side in her gluteal area. The pain is constant. It is hard to describe. It is moderate. The pain is worse when going from sitting to standing or when walking up stairs. She did take some advil and tried heat with some help. She denies fall, injury, fever, numbness, tingling, or saddle anesthesia. 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 pylori infection 11/16/1993   Hiatal hernia    History of benign adrenal tumor 2007   [facility]   History of pneumonia 2007, 2008   Hyperlipidemia    Hypertension    IBS (irritable bowel syndrome)    Internal hemorrhoids    Kyphosis    Memory loss    Menopause    Metabolic syndrome    OSA on CPAP    Type 2 diabetes mellitus (HCC)    Vitamin D deficiency     Past Surgical History:  Procedure Laterality Date   abdominal wall lematoma of fabdd hys.     APPENDECTOMY     CARPAL TUNNEL RELEASE     CESAREAN SECTION     X2   COLONOSCOPY     INCISIONAL HERNIA REPAIR     right breast biopsy  09/1995   ROTATOR CUFF REPAIR  2002   right   ROTATOR CUFF REPAIR/ bone spur  08/30/2009   SHOULDER ARTHROSCOPY WITH ROTATOR CUFF REPAIR AND SUBACROMIAL DECOMPRESSION Right 01/19/2018   Procedure: REVISION RIGHT MINI OPEN ROTATOR CUFF REPAIR AND SUBACROMIAL DECOMPRESSION;  Surgeon: Jene Every, MD;  Location: [facility];  Service: Orthopedics;  Laterality: Right;  90 MINS   sleep apnea surgery  04/1995   torn  cartiledge rt knee x2  05/13/2006   Dr. Sherlean Foot [facility]    TOTAL ABDOMINAL HYSTERECTOMY W/ BILATERAL SALPINGOOPHORECTOMY  07/1993   fibroids     Family History  Problem Relation Age of Onset   Heart disease Mother    Parkinsonism Father    Emphysema Father    Hip fracture Father    Heart failure Sister    Ovarian cancer Sister    COPD Sister    Lymphoma Sister    COPD Sister    COPD Brother    Hypertension Brother    Heart disease Brother    COPD Brother    Colon cancer Neg Hx    Esophageal cancer Neg Hx    Pancreatic cancer Neg Hx    Stomach cancer Neg Hx    Breast cancer Neg Hx     Social History   Socioeconomic History   Marital status: Married    Spouse name: [name]   Number of children: 2   Years of education: some college   Highest education level: Some college, no degree  Occupational History   Occupation: retired    Comment: daycare  Tobacco Use  Smoking status: Never   Smokeless tobacco: Never  Vaping Use   Vaping Use: Never used  Substance and Sexual Activity   Alcohol use: No   Drug use: No   Sexual activity: Not on file  Other Topics Concern   Not on file  Social History Narrative   Melissa Bowman is retired. She lives with her husband [name]. They have two grown daughters that live locally. [name] enjoys reading and puzzles.    Social Determinants of Health   Financial Resource Strain: Low Risk    Difficulty of Paying Living Expenses: Not hard at all  Food Insecurity: No Food Insecurity   Worried About Programme researcher, broadcasting/film/video in the Last Year: Never true   Ran Out of Food in the Last Year: Never true  Transportation Needs: No Transportation Needs   Lack of Transportation (Medical): No   Lack of Transportation (Non-Medical): No  Physical Activity: Insufficiently Active   Days of Exercise per Week: 7 days   Minutes of Exercise per Session: 20 min  Stress: No Stress Concern Present   Feeling of Stress : Only a little  Social Connections: Moderately  Isolated   Frequency of Communication with Friends and Family: More than three times a week   Frequency of Social Gatherings with Friends and Family: More than three times a week   Attends Religious Services: Never   Database administrator or Organizations: No   Attends Engineer, structural: Never   Marital Status: Married  Catering manager Violence: Not At Risk   Fear of Current or Ex-Partner: No   Emotionally Abused: No   Physically Abused: No   Sexually Abused: No    Outpatient Medications Prior to Visit  Medication Sig Dispense Refill   amitriptyline (ELAVIL) 10 MG tablet TAKE 1 TABLET BY MOUTH AT  BEDTIME 90 tablet 3   amLODipine (NORVASC) 5 MG tablet TAKE 1 TABLET BY MOUTH  DAILY 90 tablet 3   ARIPiprazole (ABILIFY) 5 MG tablet TAKE 1 TABLET BY MOUTH  DAILY 90 tablet 1   Ascorbic Acid (VITAMIN C) 1000 MG tablet Take 1,000 mg by mouth daily.     aspirin EC 81 MG tablet Take 1 tablet (81 mg total) by mouth daily.     Calcium Carb-Cholecalciferol 600-800 MG-UNIT TABS Take 2 tablets by mouth daily.     Cholecalciferol (VITAMIN D-3) 5000 UNITS TABS Take 5,000 Units by mouth daily.     escitalopram (LEXAPRO) 20 MG tablet TAKE 1 TABLET BY MOUTH  DAILY 90 tablet 1   fenofibrate 160 MG tablet TAKE 1 TABLET BY MOUTH  DAILY 90 tablet 1   fluticasone (FLONASE) 50 MCG/ACT nasal spray Use 2 sprays in each  nostril daily 48 g 2   irbesartan-hydrochlorothiazide (AVALIDE) 150-12.5 MG tablet TAKE 1 TABLET BY MOUTH  DAILY TO REPLACE VALSARTAN- HCTZ 90 tablet 1   montelukast (SINGULAIR) 10 MG tablet TAKE 1 TABLET BY MOUTH  DAILY 90 tablet 3   omeprazole (PRILOSEC) 40 MG capsule TAKE 1 CAPSULE BY MOUTH  DAILY 90 capsule 3   valACYclovir (VALTREX) 1000 MG tablet 2 po BID for 1 day at fever blister onset. May repeat if reoccurs 12 tablet 0   PROAIR HFA 108 (90 BASE) MCG/ACT inhaler Use 2 puffs into the lungs  every 6 hours as needed for wheezing or shortness of  breath (Patient not taking:  Reported on 12/01/2021) 25.5 g 2   Facility-Administered Medications Prior to Visit  Medication Dose Route Frequency Provider  Last Rate Last Admin   0.9 %  sodium chloride infusion  500 mL Intravenous Once Jenel Lucks, MD        Allergies  Allergen Reactions   Statins Other (See Comments)    myalgias   E-Mycin [Erythromycin] Other (See Comments)    Medication intolerance stomach cramps   Estrogens Other (See Comments)    Estrogen replacement Therapy intolerance mood changes   Sulfa Antibiotics Other (See Comments)    Causes severe stomach cramps    Vioxx [Rofecoxib] Other (See Comments)    Unknown reaction/interaction. Possible stomach cramps    Review of Systems As per HPI.     Objective:    Physical Exam Vitals and nursing note reviewed.  Constitutional:      General: She is not in acute distress.    Appearance: She is not ill-appearing, toxic-appearing or diaphoretic.  Pulmonary:     Effort: Pulmonary effort is normal. No respiratory distress.  Musculoskeletal:     Lumbar back: Tenderness (left gluteal) present. No swelling, edema, deformity or bony tenderness.     Right lower leg: No edema.     Left lower leg: No edema.  Skin:    General: Skin is warm and dry.  Neurological:     Mental Status: She is alert and oriented to person, place, and time.     Gait: Gait normal.  Psychiatric:        Mood and Affect: Mood normal.        Behavior: Behavior normal.    BP 105/61    Pulse (!) 54    Temp (!) 97.2 F (36.2 C) (Temporal)    Ht 5' (1.524 m)    Wt 157 lb (71.2 kg)    BMI 30.66 kg/m  Wt Readings from Last 3 Encounters:  12/01/21 157 lb (71.2 kg)  11/03/21 159 lb 12.8 oz (72.5 kg)  08/03/21 161 lb (73 kg)    Health Maintenance Due  Topic Date Due   DEXA SCAN  05/29/2020   COVID-19 Vaccine (4 - Booster for Moderna series) 12/30/2020   OPHTHALMOLOGY EXAM  01/08/2021   INFLUENZA VACCINE  07/13/2021    There are no preventive care reminders to  display for this patient.   Lab Results  Component Value Date   TSH 2.290 01/21/2021   Lab Results  Component Value Date   WBC 8.5 06/19/2021   HGB 13.3 06/19/2021   HCT 37.5 06/19/2021   MCV 85.1 06/19/2021   PLT 306.0 06/19/2021   Lab Results  Component Value Date   NA 139 06/19/2021   K 3.8 06/19/2021   CO2 28 06/19/2021   GLUCOSE 96 06/19/2021   BUN 24 (H) 06/19/2021   CREATININE 0.78 06/19/2021   BILITOT 0.4 06/19/2021   ALKPHOS 38 (L) 06/19/2021   AST 15 06/19/2021   ALT 14 06/19/2021   PROT 7.4 06/19/2021   ALBUMIN 4.7 06/19/2021   CALCIUM 10.0 06/19/2021   ANIONGAP 10 01/12/2018   EGFR 88 05/01/2021   GFR 76.78 06/19/2021   Lab Results  Component Value Date   CHOL 144 05/01/2021   Lab Results  Component Value Date   HDL 33 (L) 05/01/2021   Lab Results  Component Value Date   LDLCALC 83 05/01/2021   Lab Results  Component Value Date   TRIG 163 (H) 05/01/2021   Lab Results  Component Value Date   CHOLHDL 4.4 05/01/2021   Lab Results  Component Value Date   HGBA1C 5.2 08/03/2021  Assessment & Plan:   Melissa Bowman was seen today for back pain.  Diagnoses and all orders for this visit:  Left lumbar pain Musclar. Zanaflex and lidocaine prn. Tylenol, heat, ice, stretching. Return to office for new or worsening symptoms. -     tiZANidine (ZANAFLEX) 4 MG tablet; Take 1 tablet (4 mg total) by mouth every 6 (six) hours as needed for muscle spasms. -     lidocaine (XYLOCAINE) 5 % ointment; Apply 1 application topically as needed.  Need for immunization against influenza -     Flu Vaccine QUAD High Dose(Fluad)  The patient indicates understanding of these issues and agrees with the plan.  Gabriel Earing, FNP

## 2021-12-01 NOTE — Patient Instructions (Signed)

## 2021-12-13 DIAGNOSIS — G4733 Obstructive sleep apnea (adult) (pediatric): Secondary | ICD-10-CM | POA: Diagnosis not present

## 2021-12-16 ENCOUNTER — Other Ambulatory Visit: Payer: Self-pay | Admitting: Family Medicine

## 2021-12-24 DIAGNOSIS — R011 Cardiac murmur, unspecified: Secondary | ICD-10-CM | POA: Insufficient documentation

## 2021-12-24 DIAGNOSIS — H02135 Senile ectropion of left lower eyelid: Secondary | ICD-10-CM | POA: Diagnosis not present

## 2021-12-24 DIAGNOSIS — H02142 Spastic ectropion of right lower eyelid: Secondary | ICD-10-CM | POA: Diagnosis not present

## 2021-12-24 DIAGNOSIS — G4730 Sleep apnea, unspecified: Secondary | ICD-10-CM | POA: Insufficient documentation

## 2021-12-24 DIAGNOSIS — H04523 Eversion of bilateral lacrimal punctum: Secondary | ICD-10-CM | POA: Diagnosis not present

## 2021-12-24 DIAGNOSIS — H02532 Eyelid retraction right lower eyelid: Secondary | ICD-10-CM | POA: Diagnosis not present

## 2021-12-24 DIAGNOSIS — H02145 Spastic ectropion of left lower eyelid: Secondary | ICD-10-CM | POA: Diagnosis not present

## 2021-12-24 DIAGNOSIS — H04123 Dry eye syndrome of bilateral lacrimal glands: Secondary | ICD-10-CM | POA: Diagnosis not present

## 2021-12-24 DIAGNOSIS — H02535 Eyelid retraction left lower eyelid: Secondary | ICD-10-CM | POA: Diagnosis not present

## 2021-12-24 DIAGNOSIS — H02112 Cicatricial ectropion of right lower eyelid: Secondary | ICD-10-CM | POA: Diagnosis not present

## 2021-12-24 DIAGNOSIS — H16213 Exposure keratoconjunctivitis, bilateral: Secondary | ICD-10-CM | POA: Diagnosis not present

## 2021-12-24 DIAGNOSIS — H02132 Senile ectropion of right lower eyelid: Secondary | ICD-10-CM | POA: Diagnosis not present

## 2021-12-24 DIAGNOSIS — H02115 Cicatricial ectropion of left lower eyelid: Secondary | ICD-10-CM | POA: Diagnosis not present

## 2021-12-27 ENCOUNTER — Other Ambulatory Visit: Payer: Self-pay | Admitting: Family Medicine

## 2022-01-12 ENCOUNTER — Other Ambulatory Visit: Payer: Self-pay | Admitting: Family Medicine

## 2022-01-13 DIAGNOSIS — G4733 Obstructive sleep apnea (adult) (pediatric): Secondary | ICD-10-CM | POA: Diagnosis not present

## 2022-02-03 ENCOUNTER — Ambulatory Visit (INDEPENDENT_AMBULATORY_CARE_PROVIDER_SITE_OTHER): Payer: Medicare Other | Admitting: Family Medicine

## 2022-02-03 ENCOUNTER — Encounter: Payer: Self-pay | Admitting: Family Medicine

## 2022-02-03 VITALS — BP 120/68 | HR 51 | Temp 98.4°F | Ht 60.0 in | Wt 157.8 lb

## 2022-02-03 DIAGNOSIS — M609 Myositis, unspecified: Secondary | ICD-10-CM | POA: Diagnosis not present

## 2022-02-03 DIAGNOSIS — F32A Depression, unspecified: Secondary | ICD-10-CM | POA: Diagnosis not present

## 2022-02-03 DIAGNOSIS — I152 Hypertension secondary to endocrine disorders: Secondary | ICD-10-CM

## 2022-02-03 DIAGNOSIS — E1169 Type 2 diabetes mellitus with other specified complication: Secondary | ICD-10-CM | POA: Diagnosis not present

## 2022-02-03 DIAGNOSIS — E119 Type 2 diabetes mellitus without complications: Secondary | ICD-10-CM | POA: Diagnosis not present

## 2022-02-03 DIAGNOSIS — T466X5A Adverse effect of antihyperlipidemic and antiarteriosclerotic drugs, initial encounter: Secondary | ICD-10-CM

## 2022-02-03 DIAGNOSIS — F419 Anxiety disorder, unspecified: Secondary | ICD-10-CM | POA: Diagnosis not present

## 2022-02-03 DIAGNOSIS — E785 Hyperlipidemia, unspecified: Secondary | ICD-10-CM | POA: Diagnosis not present

## 2022-02-03 DIAGNOSIS — E1159 Type 2 diabetes mellitus with other circulatory complications: Secondary | ICD-10-CM

## 2022-02-03 LAB — CMP14+EGFR
ALT: 12 IU/L (ref 0–32)
AST: 13 IU/L (ref 0–40)
Albumin/Globulin Ratio: 2.8 — ABNORMAL HIGH (ref 1.2–2.2)
Albumin: 4.8 g/dL — ABNORMAL HIGH (ref 3.7–4.7)
Alkaline Phosphatase: 38 IU/L — ABNORMAL LOW (ref 44–121)
BUN/Creatinine Ratio: 34 — ABNORMAL HIGH (ref 12–28)
BUN: 28 mg/dL — ABNORMAL HIGH (ref 8–27)
Bilirubin Total: 0.3 mg/dL (ref 0.0–1.2)
CO2: 27 mmol/L (ref 20–29)
Calcium: 9.8 mg/dL (ref 8.7–10.3)
Chloride: 102 mmol/L (ref 96–106)
Creatinine, Ser: 0.83 mg/dL (ref 0.57–1.00)
Globulin, Total: 1.7 g/dL (ref 1.5–4.5)
Glucose: 93 mg/dL (ref 70–99)
Potassium: 3.8 mmol/L (ref 3.5–5.2)
Sodium: 142 mmol/L (ref 134–144)
Total Protein: 6.5 g/dL (ref 6.0–8.5)
eGFR: 75 mL/min/{1.73_m2} (ref >59)

## 2022-02-03 LAB — LIPID PANEL
Chol/HDL Ratio: 3.9 ratio (ref 0.0–4.4)
Cholesterol, Total: 165 mg/dL (ref 100–199)
HDL: 42 mg/dL (ref >39)
LDL Chol Calc (NIH): 105 mg/dL — ABNORMAL HIGH (ref 0–99)
Triglycerides: 100 mg/dL (ref 0–149)
VLDL Cholesterol Cal: 18 mg/dL (ref 5–40)

## 2022-02-03 LAB — BAYER DCA HB A1C WAIVED: HB A1C (BAYER DCA - WAIVED): 5.2 % (ref 4.8–5.6)

## 2022-02-03 NOTE — Progress Notes (Signed)
Subjective: CC:Dm PCP: Melissa Norlander, DO Melissa Bowman is a 71 y.o. female presenting to clinic today for:  1. Type 2 Diabetes with hypertension, hyperlipidemia:  Diabetes has been diet controlled.  She continues to keep a fairly good diet.  She is compliant with her fenofibrate, Avalide, Norvasc and daily aspirin  Last eye exam: Up-to-date Last foot exam: Up-to-date Last A1c:  Lab Results  Component Value Date   HGBA1C 5.2 02/03/2022   Nephropathy screen indicated?:  On ARB Last flu, zoster and/or pneumovax:  Immunization History  Administered Date(s) Administered   Fluad Quad(high Dose 65+) 10/30/2019, 09/02/2020, 12/01/2021   Influenza, High Dose Seasonal PF 10/06/2018   Influenza,inj,Quad PF,6+ Mos 09/28/2013, 09/27/2014, 10/15/2015, 11/03/2016   Moderna Sars-Covid-2 Vaccination 02/26/2020, 03/25/2020, 11/04/2020   Pneumococcal Conjugate-13 05/25/2018   Pneumococcal Polysaccharide-23 10/30/2019   Tdap 09/27/2014   Zoster Recombinat (Shingrix) 02/07/2019, 08/03/2021   Zoster, Live 01/31/2013    ROS: Denies dizziness, LOC, polyuria, polydipsia, foot ulcerations, numbness or tingling in extremities, shortness of breath or chest pain.  ROS: Per HPI  Allergies  Allergen Reactions   Statins Other (See Comments)    myalgias   E-Mycin [Erythromycin] Other (See Comments)    Medication intolerance stomach cramps   Estrogens Other (See Comments)    Estrogen replacement Therapy intolerance mood changes   Sulfa Antibiotics Other (See Comments)    Causes severe stomach cramps    Vioxx [Rofecoxib] Other (See Comments)    Unknown reaction/interaction. 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 pylori infection  11/16/1993   Hiatal hernia    History of benign adrenal tumor 2007   [facility]   History of pneumonia 2007, 2008   Hyperlipidemia    Hypertension    IBS (irritable bowel syndrome)    Internal hemorrhoids    Kyphosis    Memory loss    Menopause    Metabolic syndrome    OSA on CPAP    Type 2 diabetes mellitus (HCC)    Vitamin D deficiency     Current Outpatient Medications:    amitriptyline (ELAVIL) 10 MG tablet, TAKE 1 TABLET BY MOUTH AT  BEDTIME, Disp: 90 tablet, Rfl: 3   amLODipine (NORVASC) 5 MG tablet, TAKE 1 TABLET BY MOUTH  DAILY, Disp: 90 tablet, Rfl: 0   ARIPiprazole (ABILIFY) 5 MG tablet, TAKE 1 TABLET BY MOUTH  DAILY, Disp: 90 tablet, Rfl: 0   Ascorbic Acid (VITAMIN C) 1000 MG tablet, Take 1,000 mg by mouth daily., Disp: , Rfl:    aspirin EC 81 MG tablet, Take 1 tablet (81 mg total) by mouth daily., Disp: , Rfl:    Calcium Carb-Cholecalciferol 600-800 MG-UNIT TABS, Take 2 tablets by mouth daily., Disp: , Rfl:    Cholecalciferol (VITAMIN D-3) 5000 UNITS TABS, Take 5,000 Units by mouth daily., Disp: , Rfl:    escitalopram (LEXAPRO) 20 MG tablet, TAKE 1 TABLET BY MOUTH  DAILY, Disp: 90 tablet, Rfl: 1   fenofibrate 160 MG tablet, TAKE 1 TABLET BY MOUTH  DAILY, Disp: 90 tablet, Rfl: 1   fluticasone (FLONASE) 50 MCG/ACT nasal spray, Use 2 sprays in each  nostril daily, Disp: 48 g, Rfl:  2   irbesartan-hydrochlorothiazide (AVALIDE) 150-12.5 MG tablet, TAKE 1 TABLET BY MOUTH  DAILY, Disp: 90 tablet, Rfl: 0   lidocaine (XYLOCAINE) 5 % ointment, Apply 1 application topically as needed., Disp: 35.44 g, Rfl: 0   montelukast (SINGULAIR) 10 MG tablet, TAKE 1 TABLET BY MOUTH  DAILY, Disp: 90 tablet, Rfl: 0   omeprazole (PRILOSEC) 40 MG capsule, TAKE 1 CAPSULE BY MOUTH  DAILY, Disp: 90 capsule, Rfl: 3   PROAIR HFA 108 (90 BASE) MCG/ACT inhaler, Use 2 puffs into the lungs  every 6 hours as needed for wheezing or shortness of  breath (Patient not taking: Reported on 12/01/2021), Disp: 25.5 g, Rfl:  2   tiZANidine (ZANAFLEX) 4 MG tablet, Take 1 tablet (4 mg total) by mouth every 6 (six) hours as needed for muscle spasms., Disp: 30 tablet, Rfl: 0   valACYclovir (VALTREX) 1000 MG tablet, 2 po BID for 1 day at fever blister onset. May repeat if reoccurs, Disp: 12 tablet, Rfl: 0  Current Facility-Administered Medications:    0.9 %  sodium chloride infusion, 500 mL, Intravenous, Once, Tomasa Rand, Dub Amis, MD Social History   Socioeconomic History   Marital status: Married    Spouse name: [name]   Number of children: 2   Years of education: some college   Highest education level: Some college, no degree  Occupational History   Occupation: retired    Comment: daycare  Tobacco Use   Smoking status: Never   Smokeless tobacco: Never  Building services engineer Use: Never used  Substance and Sexual Activity   Alcohol use: No   Drug use: No   Sexual activity: Not on file  Other Topics Concern   Not on file  Social History Narrative   [name] is retired. She lives with her husband [name]. They have two grown daughters that live locally. [name] enjoys reading and puzzles.    Social Determinants of Health   Financial Resource Strain: Low Risk    Difficulty of Paying Living Expenses: Not hard at all  Food Insecurity: No Food Insecurity   Worried About Programme researcher, broadcasting/film/video in the Last Year: Never true   Ran Out of Food in the Last Year: Never true  Transportation Needs: No Transportation Needs   Lack of Transportation (Medical): No   Lack of Transportation (Non-Medical): No  Physical Activity: Insufficiently Active   Days of Exercise per Week: 7 days   Minutes of Exercise per Session: 20 min  Stress: No Stress Concern Present   Feeling of Stress : Only a little  Social Connections: Moderately Isolated   Frequency of Communication with Friends and Family: More than three times a week   Frequency of Social Gatherings with Friends and Family: More than three times a week   Attends Religious  Services: Never   Database administrator or Organizations: No   Attends Engineer, structural: Never   Marital Status: Married  Catering manager Violence: Not At Risk   Fear of Current or Ex-Partner: No   Emotionally Abused: No   Physically Abused: No   Sexually Abused: No   Family History  Problem Relation Age of Onset   Heart disease Mother    Parkinsonism Father    Emphysema Father    Hip fracture Father    Heart failure Sister    Ovarian cancer Sister    COPD Sister    Lymphoma Sister    COPD Sister    COPD Brother  Hypertension Brother    Heart disease Brother    COPD Brother    Colon cancer Neg Hx    Esophageal cancer Neg Hx    Pancreatic cancer Neg Hx    Stomach cancer Neg Hx    Breast cancer Neg Hx     Objective: Office vital signs reviewed. BP 120/68    Pulse (!) 51    Temp 98.4 F (36.9 C)    Ht 5' (1.524 m)    Wt 157 lb 12.8 oz (71.6 kg)    SpO2 98%    BMI 30.82 kg/m   Physical Examination:  General: Awake, alert, nontoxic female, No acute distress HEENT: Sclera white.  Moist mucous membranes Cardio: regular rate and rhythm, S1S2 heard, no murmurs appreciated Pulm: clear to auscultation bilaterally, no wheezes, rhonchi or rales; normal work of breathing on room air Extremities: warm, well perfused, No edema, cyanosis or clubbing; +2 pulses bilaterally MSK: Ambulating independently Neuro: Jaw tremor present.  Follows commands  Assessment/ Plan: 71 y.o. female   Diet-controlled diabetes mellitus ([place]) - Plan: Bayer DCA Hb A1c Waived  Hypertension associated with diabetes ([place]) - Plan: CMP14+EGFR  Hyperlipidemia associated with type 2 diabetes mellitus ([place]) - Plan: CMP14+EGFR, Lipid Panel  Statin-induced myositis  Anxiety and depression  Diabetes remains diet controlled.  Blood pressure is well controlled.  Continue current regimen.  Check fasting lipid, CMP.  We will need to continue fenofibrate as she has had statin intolerance in the  past  Anxiety and depression are controlled with current medications.  I would like to get an EKG on her at the next visit given use of atypical antipsychotic and we will plan for DEXA scan at that time as well  No orders of the defined types were placed in this encounter.  No orders of the defined types were placed in this encounter.    Melissa Norlander, DO [place] [phone]

## 2022-02-06 DIAGNOSIS — G4733 Obstructive sleep apnea (adult) (pediatric): Secondary | ICD-10-CM | POA: Diagnosis not present

## 2022-02-07 ENCOUNTER — Other Ambulatory Visit: Payer: Self-pay | Admitting: Family Medicine

## 2022-02-10 DIAGNOSIS — G4733 Obstructive sleep apnea (adult) (pediatric): Secondary | ICD-10-CM | POA: Diagnosis not present

## 2022-02-16 ENCOUNTER — Other Ambulatory Visit: Payer: Self-pay | Admitting: Family Medicine

## 2022-03-01 DIAGNOSIS — H02135 Senile ectropion of left lower eyelid: Secondary | ICD-10-CM | POA: Diagnosis not present

## 2022-03-01 DIAGNOSIS — H02535 Eyelid retraction left lower eyelid: Secondary | ICD-10-CM | POA: Diagnosis not present

## 2022-03-01 DIAGNOSIS — H02115 Cicatricial ectropion of left lower eyelid: Secondary | ICD-10-CM | POA: Diagnosis not present

## 2022-03-01 DIAGNOSIS — H11822 Conjunctivochalasis, left eye: Secondary | ICD-10-CM | POA: Diagnosis not present

## 2022-03-01 DIAGNOSIS — H04562 Stenosis of left lacrimal punctum: Secondary | ICD-10-CM | POA: Diagnosis not present

## 2022-03-13 ENCOUNTER — Other Ambulatory Visit: Payer: Self-pay | Admitting: Family Medicine

## 2022-03-13 DIAGNOSIS — G4733 Obstructive sleep apnea (adult) (pediatric): Secondary | ICD-10-CM | POA: Diagnosis not present

## 2022-03-17 ENCOUNTER — Other Ambulatory Visit: Payer: Self-pay | Admitting: Family Medicine

## 2022-03-30 ENCOUNTER — Encounter: Payer: Self-pay | Admitting: Family Medicine

## 2022-03-31 ENCOUNTER — Other Ambulatory Visit: Payer: Self-pay | Admitting: Family Medicine

## 2022-04-04 ENCOUNTER — Other Ambulatory Visit: Payer: Self-pay | Admitting: Family Medicine

## 2022-04-07 ENCOUNTER — Encounter: Payer: Self-pay | Admitting: *Deleted

## 2022-04-12 DIAGNOSIS — G4733 Obstructive sleep apnea (adult) (pediatric): Secondary | ICD-10-CM | POA: Diagnosis not present

## 2022-04-12 DIAGNOSIS — H04123 Dry eye syndrome of bilateral lacrimal glands: Secondary | ICD-10-CM | POA: Diagnosis not present

## 2022-04-12 DIAGNOSIS — H16211 Exposure keratoconjunctivitis, right eye: Secondary | ICD-10-CM | POA: Diagnosis not present

## 2022-04-12 DIAGNOSIS — H02112 Cicatricial ectropion of right lower eyelid: Secondary | ICD-10-CM | POA: Diagnosis not present

## 2022-04-12 DIAGNOSIS — H02132 Senile ectropion of right lower eyelid: Secondary | ICD-10-CM | POA: Diagnosis not present

## 2022-04-12 DIAGNOSIS — H04521 Eversion of right lacrimal punctum: Secondary | ICD-10-CM | POA: Diagnosis not present

## 2022-04-12 DIAGNOSIS — H02532 Eyelid retraction right lower eyelid: Secondary | ICD-10-CM | POA: Diagnosis not present

## 2022-04-12 DIAGNOSIS — H04561 Stenosis of right lacrimal punctum: Secondary | ICD-10-CM | POA: Diagnosis not present

## 2022-04-12 DIAGNOSIS — H02032 Senile entropion of right lower eyelid: Secondary | ICD-10-CM | POA: Diagnosis not present

## 2022-04-12 DIAGNOSIS — H11821 Conjunctivochalasis, right eye: Secondary | ICD-10-CM | POA: Diagnosis not present

## 2022-04-12 DIAGNOSIS — H02142 Spastic ectropion of right lower eyelid: Secondary | ICD-10-CM | POA: Diagnosis not present

## 2022-04-23 ENCOUNTER — Ambulatory Visit (INDEPENDENT_AMBULATORY_CARE_PROVIDER_SITE_OTHER): Payer: Medicare Other

## 2022-04-23 VITALS — Wt 155.0 lb

## 2022-04-23 DIAGNOSIS — Z0000 Encounter for general adult medical examination without abnormal findings: Secondary | ICD-10-CM | POA: Diagnosis not present

## 2022-04-23 NOTE — Progress Notes (Signed)
? ?Subjective:  ? Melissa Bowman is a 71 y.o. female who presents for Medicare Annual (Subsequent) preventive examination. ? ?Virtual Visit via Telephone Note ? ?I connected with  Melissa Bowman on 04/23/22 at  4:00 PM EDT by telephone and verified that I am speaking with the correct person using two identifiers. ? ?Location: ?Patient: Home ?Provider: [facility] ?Persons participating in the virtual visit: patient/Nurse Health Advisor ?  ?I discussed the limitations, risks, security and privacy concerns of performing an evaluation and management service by telephone and the availability of in person appointments. The patient expressed understanding and agreed to proceed. ? ?Interactive audio and video telecommunications were attempted between this nurse and patient, however failed, due to patient having technical difficulties OR patient did not have access to video capability.  We continued and completed visit with audio only. ? ?Some vital signs may be absent or patient reported.  ? ?Melissa Gillingham Dionne Ano, LPN  ? ?Review of Systems    ? ?Cardiac Risk Factors include: advanced age (>25mn, >>60women);dyslipidemia;hypertension;sedentary lifestyle;obesity (BMI >30kg/m2);Other (see comment), Risk factor comments: OSA on CPAP, ? ?   ?Objective:  ?  ?Today's Vitals  ? 04/23/22 1605  ?Weight: 155 lb (70.3 kg)  ? ?Body mass index is 30.27 kg/m?. ? ? ?  04/23/2022  ?  4:22 PM 04/22/2021  ?  1:30 PM 03/04/2020  ?  8:32 AM 02/07/2019  ? 10:38 AM 01/19/2018  ?  6:11 AM 01/12/2018  ?  1:26 PM  ?Advanced Directives  ?Does Patient Have a Medical Advance Directive? No No No No No No  ?Would patient like information on creating a medical advance directive? No - Patient declined No - Patient declined No - Patient declined Yes (MAU/Ambulatory/Procedural Areas - Information given) No - Patient declined No - Patient declined  ? ? ?Current Medications (verified) ?Outpatient Encounter Medications as of 04/23/2022  ?Medication Sig  ? amitriptyline (ELAVIL)  10 MG tablet TAKE 1 TABLET BY MOUTH AT  BEDTIME  ? ARIPiprazole (ABILIFY) 5 MG tablet TAKE 1 TABLET BY MOUTH  DAILY  ? Ascorbic Acid (VITAMIN C) 1000 MG tablet Take 1,000 mg by mouth daily.  ? aspirin EC 81 MG tablet Take 1 tablet (81 mg total) by mouth daily.  ? Calcium Carb-Cholecalciferol 600-800 MG-UNIT TABS Take 2 tablets by mouth daily.  ? Cholecalciferol (VITAMIN D-3) 5000 UNITS TABS Take 5,000 Units by mouth daily.  ? escitalopram (LEXAPRO) 20 MG tablet TAKE 1 TABLET BY MOUTH  DAILY  ? fenofibrate 160 MG tablet TAKE 1 TABLET BY MOUTH  DAILY  ? irbesartan-hydrochlorothiazide (AVALIDE) 150-12.5 MG tablet TAKE 1 TABLET BY MOUTH DAILY  ? montelukast (SINGULAIR) 10 MG tablet TAKE 1 TABLET BY MOUTH DAILY  ? omeprazole (PRILOSEC) 40 MG capsule TAKE 1 CAPSULE BY MOUTH  DAILY  ? fluticasone (FLONASE) 50 MCG/ACT nasal spray Use 2 sprays in each  nostril daily (Patient not taking: Reported on 04/23/2022)  ? lidocaine (XYLOCAINE) 5 % ointment Apply 1 application topically as needed. (Patient not taking: Reported on 04/23/2022)  ? PROAIR HFA 108 (90 BASE) MCG/ACT inhaler Use 2 puffs into the lungs  every 6 hours as needed for wheezing or shortness of  breath (Patient not taking: Reported on 04/23/2022)  ? tiZANidine (ZANAFLEX) 4 MG tablet Take 1 tablet (4 mg total) by mouth every 6 (six) hours as needed for muscle spasms. (Patient not taking: Reported on 04/23/2022)  ? valACYclovir (VALTREX) 1000 MG tablet 2 po BID for 1 day at  fever blister onset. May repeat if reoccurs (Patient not taking: Reported on 04/23/2022)  ? ?Facility-Administered Encounter Medications as of 04/23/2022  ?Medication  ? 0.9 %  sodium chloride infusion  ? ? ?Allergies (verified) ?Statins, E-mycin [erythromycin], Estrogens, Sulfa antibiotics, and Vioxx [rofecoxib]  ? ?History: ?Past Medical History:  ?Diagnosis Date  ? Allergic rhinitis   ? Allergy to ertapenem   ? Anxiety and depression   ? Asthmatic bronchitis   ? Bilateral carpal tunnel syndrome   ?  Condyloma acuminata   ? Diverticulosis   ? Eczema   ? GERD (gastroesophageal reflux disease)   ? H. pylori infection 11/16/1993  ? Hiatal hernia   ? History of benign adrenal tumor 2007  ? [facility]  ? History of pneumonia 2007, 2008  ? Hyperlipidemia   ? Hypertension   ? IBS (irritable bowel syndrome)   ? Internal hemorrhoids   ? Kyphosis   ? Memory loss   ? Menopause   ? Metabolic syndrome   ? OSA on CPAP   ? Type 2 diabetes mellitus ([place])   ? Vitamin D deficiency   ? ?Past Surgical History:  ?Procedure Laterality Date  ? abdominal wall lematoma of fabdd hys.    ? APPENDECTOMY    ? CARPAL TUNNEL RELEASE    ? CESAREAN SECTION    ? X2  ? COLONOSCOPY    ? INCISIONAL HERNIA REPAIR    ? right breast biopsy  09/1995  ? ROTATOR CUFF REPAIR  2002  ? right  ? ROTATOR CUFF REPAIR/ bone spur  08/30/2009  ? SHOULDER ARTHROSCOPY WITH ROTATOR CUFF REPAIR AND SUBACROMIAL DECOMPRESSION Right 01/19/2018  ? Procedure: REVISION RIGHT MINI OPEN ROTATOR CUFF REPAIR AND SUBACROMIAL DECOMPRESSION;  Surgeon: Susa Day, MD;  Location: [facility];  Service: Orthopedics;  Laterality: Right;  90 MINS  ? sleep apnea surgery  04/1995  ? torn cartiledge rt knee x2  05/13/2006  ? Dr. Charlotta Newton   ? TOTAL ABDOMINAL HYSTERECTOMY W/ BILATERAL SALPINGOOPHORECTOMY  07/1993  ? fibroids   ? ?Family History  ?Problem Relation Age of Onset  ? Heart disease Mother   ? Parkinsonism Father   ? Emphysema Father   ? Hip fracture Father   ? Heart failure Sister   ? Ovarian cancer Sister   ? COPD Sister   ? Lymphoma Sister   ? COPD Sister   ? COPD Brother   ? Hypertension Brother   ? Heart disease Brother   ? COPD Brother   ? Colon cancer Neg Hx   ? Esophageal cancer Neg Hx   ? Pancreatic cancer Neg Hx   ? Stomach cancer Neg Hx   ? Breast cancer Neg Hx   ? ?Social History  ? ?Socioeconomic History  ? Marital status: Married  ?  Spouse name: [name]  ? Number of children: 2  ? Years of education: some college  ? Highest education level: Some college, no degree   ?Occupational History  ? 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 Smoking status: Never  ? Smokeless tobacco: Never  ?Vaping Use  ? Vaping Use: Never used  ?Substance and Sexual Activity  ? Alcohol use: No  ? Drug use: No  ? Sexual activity: Not on file  ?Other Topics Concern  ? Not on file  ?Social History Narrative  ? Jayliah is retired. She lives with her husband [name]. They have two grown daughters that live locally. Mallika enjoys reading and puzzles.   ? ?  Social Determinants of Health  ? ?Financial Resource Strain: Not on file  ?Food Insecurity: Not on file  ?Transportation Needs: Not on file  ?Physical Activity: Insufficiently Active  ? Days of Exercise per Week: 7 days  ? Minutes of Exercise per Session: 20 min  ?Stress: No Stress Concern Present  ? Feeling of Stress : Only a little  ?Social Connections: Not on file  ? ? ?Tobacco Counseling ?Counseling given: Not Answered ? ? ?Clinical Intake: ? ?Pre-visit preparation completed: Yes ? ?Pain : No/denies pain ? ?  ? ?BMI - recorded: 30.27 ?Nutritional Status: BMI > 30  Obese ?Nutritional Risks: None ?Diabetes: Yes ?CBG done?: No ?Did pt. bring in CBG monitor from home?: No ? ?How often do you need to have someone help you when you read instructions, pamphlets, or other written materials from your doctor or pharmacy?: 1 - Never ? ?Diabetic? Nutrition Risk Assessment: ? ?Has the patient had any N/V/D within the last 2 months?  No  ?Does the patient have any non-healing wounds?  No  ?Has the patient had any unintentional weight loss or weight gain?  Yes  ? ?Diabetes: ? ?Is the patient diabetic?  Yes  ?If diabetic, was a CBG obtained today?  No  ?Did the patient bring in their glucometer from home?  No  ?How often do you monitor your CBG's? never.  ? ?Financial Strains and Diabetes Management: ? ?Are you having any financial strains with the device, your supplies or your medication? No .  ?Does the patient want to be seen by Chronic Care  Management for management of their diabetes?  No  ?Would the patient like to be referred to a Nutritionist or for Diabetic Management?  No  ? ?Diabetic Exams: ? ?Diabetic Eye Exam: Completed 11/19/2021.  ? ?Diabetic Fo

## 2022-04-23 NOTE — Patient Instructions (Signed)
Melissa Bowman , ?Thank you for taking time to come for your Medicare Wellness Visit. I appreciate your ongoing commitment to your health goals. Please review the following plan we discussed and let me know if I can assist you in the future.  ? ?Screening recommendations/referrals: ?Colonoscopy: Done 07/09/2021 - repeat in 7 years ?Mammogram: Done 08/12/2021 - Repeat annually ?Bone Density: Done 05/29/2018 - Repeat every 2 years ?Recommended yearly ophthalmology/optometry visit for glaucoma screening and checkup ?Recommended yearly dental visit for hygiene and checkup ? ?Vaccinations: ?Influenza vaccine: Done 12/01/2021 - Repeat annually ?Pneumococcal vaccine: Done  05/25/2018 & 10/30/2019   ?Tdap vaccine: Done 09/27/2014 - Repeat in 10 years ?Shingles vaccine: Done 02/07/2019 & 08/03/2021 ?Covid-19: Done  02/26/2020, 03/25/2020, & 11/04/2020 ? ?Advanced directives: Advance directive discussed with you today. Even though you declined this today, please call our office should you change your mind, and we can give you the proper paperwork for you to fill out.  ? ?Conditions/risks identified: Aim for 30 minutes of exercise or brisk walking, 6-8 glasses of water, and 5 servings of fruits and vegetables each day.  ? ?Next appointment: Follow up in one year for your annual wellness visit  ? ? ?Preventive Care 100 Years and Older, Female ?Preventive care refers to lifestyle choices and visits with your health care provider that can promote health and wellness. ?What does preventive care include? ?A yearly physical exam. This is also called an annual well check. ?Dental exams once or twice a year. ?Routine eye exams. Ask your health care provider how often you should have your eyes checked. ?Personal lifestyle choices, including: ?Daily care of your teeth and gums. ?Regular physical activity. ?Eating a healthy diet. ?Avoiding tobacco and drug use. ?Limiting alcohol use. ?Practicing safe sex. ?Taking low-dose aspirin every day. ?Taking  vitamin and mineral supplements as recommended by your health care provider. ?What happens during an annual well check? ?The services and screenings done by your health care provider during your annual well check will depend on your age, overall health, lifestyle risk factors, and family history of disease. ?Counseling  ?Your health care provider may ask you questions about your: ?Alcohol use. ?Tobacco use. ?Drug use. ?Emotional well-being. ?Home and relationship well-being. ?Sexual activity. ?Eating habits. ?History of falls. ?Memory and ability to understand (cognition). ?Work and work Statistician. ?Reproductive health. ?Screening  ?You may have the following tests or measurements: ?Height, weight, and BMI. ?Blood pressure. ?Lipid and cholesterol levels. These may be checked every 5 years, or more frequently if you are over 47 years old. ?Skin check. ?Lung cancer screening. You may have this screening every year starting at age 14 if you have a 30-pack-year history of smoking and currently smoke or have quit within the past 15 years. ?Fecal occult blood test (FOBT) of the stool. You may have this test every year starting at age 36. ?Flexible sigmoidoscopy or colonoscopy. You may have a sigmoidoscopy every 5 years or a colonoscopy every 10 years starting at age 74. ?Hepatitis C blood test. ?Hepatitis B blood test. ?Sexually transmitted disease (STD) testing. ?Diabetes screening. This is done by checking your blood sugar (glucose) after you have not eaten for a while (fasting). You may have this done every 1-3 years. ?Bone density scan. This is done to screen for osteoporosis. You may have this done starting at age 57. ?Mammogram. This may be done every 1-2 years. Talk to your health care provider about how often you should have regular mammograms. ?Talk with your health care  provider about your test results, treatment options, and if necessary, the need for more tests. ?Vaccines  ?Your health care provider may  recommend certain vaccines, such as: ?Influenza vaccine. This is recommended every year. ?Tetanus, diphtheria, and acellular pertussis (Tdap, Td) vaccine. You may need a Td booster every 10 years. ?Zoster vaccine. You may need this after age 62. ?Pneumococcal 13-valent conjugate (PCV13) vaccine. One dose is recommended after age 30. ?Pneumococcal polysaccharide (PPSV23) vaccine. One dose is recommended after age 23. ?Talk to your health care provider about which screenings and vaccines you need and how often you need them. ?This information is not intended to replace advice given to you by your health care provider. Make sure you discuss any questions you have with your health care provider. ?Document Released: 12/26/2015 Document Revised: 08/18/2016 Document Reviewed: 09/30/2015 ?Elsevier Interactive Patient Education ? 2017 [place]. ? ?Fall Prevention in the Home ?Falls can cause injuries. They can happen to people of all ages. There are many things you can do to make your home safe and to help prevent falls. ?What can I do on the outside of my home? ?Regularly fix the edges of walkways and driveways and fix any cracks. ?Remove anything that might make you trip as you walk through a door, such as a raised step or threshold. ?Trim any bushes or trees on the path to your home. ?Use bright outdoor lighting. ?Clear any walking paths of anything that might make someone trip, such as rocks or tools. ?Regularly check to see if handrails are loose or broken. Make sure that both sides of any steps have handrails. ?Any raised decks and porches should have guardrails on the edges. ?Have any leaves, snow, or ice cleared regularly. ?Use sand or salt on walking paths during winter. ?Clean up any spills in your garage right away. This includes oil or grease spills. ?What can I do in the bathroom? ?Use night lights. ?Install grab bars by the toilet and in the tub and shower. Do not use towel bars as grab bars. ?Use non-skid  mats or decals in the tub or shower. ?If you need to sit down in the shower, use a plastic, non-slip stool. ?Keep the floor dry. Clean up any water that spills on the floor as soon as it happens. ?Remove soap buildup in the tub or shower regularly. ?Attach bath mats securely with double-sided non-slip rug tape. ?Do not have throw rugs and other things on the floor that can make you trip. ?What can I do in the bedroom? ?Use night lights. ?Make sure that you have a light by your bed that is easy to reach. ?Do not use any sheets or blankets that are too big for your bed. They should not hang down onto the floor. ?Have a firm chair that has side arms. You can use this for support while you get dressed. ?Do not have throw rugs and other things on the floor that can make you trip. ?What can I do in the kitchen? ?Clean up any spills right away. ?Avoid walking on wet floors. ?Keep items that you use a lot in easy-to-reach places. ?If you need to reach something above you, use a strong step stool that has a grab bar. ?Keep electrical cords out of the way. ?Do not use floor polish or wax that makes floors slippery. If you must use wax, use non-skid floor wax. ?Do not have throw rugs and other things on the floor that can make you trip. ?What can I  do with my stairs? ?Do not leave any items on the stairs. ?Make sure that there are handrails on both sides of the stairs and use them. Fix handrails that are broken or loose. Make sure that handrails are as long as the stairways. ?Check any carpeting to make sure that it is firmly attached to the stairs. Fix any carpet that is loose or worn. ?Avoid having throw rugs at the top or bottom of the stairs. If you do have throw rugs, attach them to the floor with carpet tape. ?Make sure that you have a light switch at the top of the stairs and the bottom of the stairs. If you do not have them, ask someone to add them for you. ?What else can I do to help prevent falls? ?Wear shoes  that: ?Do not have high heels. ?Have rubber bottoms. ?Are comfortable and fit you well. ?Are closed at the toe. Do not wear sandals. ?If you use a stepladder: ?Make sure that it is fully opened. Do not climb a cl

## 2022-05-02 ENCOUNTER — Other Ambulatory Visit: Payer: Self-pay | Admitting: Family Medicine

## 2022-05-13 DIAGNOSIS — G4733 Obstructive sleep apnea (adult) (pediatric): Secondary | ICD-10-CM | POA: Diagnosis not present

## 2022-05-19 ENCOUNTER — Other Ambulatory Visit: Payer: Self-pay | Admitting: Family Medicine

## 2022-05-21 ENCOUNTER — Other Ambulatory Visit: Payer: Self-pay | Admitting: Family Medicine

## 2022-05-29 ENCOUNTER — Other Ambulatory Visit: Payer: Self-pay | Admitting: Family Medicine

## 2022-06-02 ENCOUNTER — Encounter: Payer: Medicare Other | Admitting: Family Medicine

## 2022-06-06 ENCOUNTER — Other Ambulatory Visit: Payer: Self-pay | Admitting: Family Medicine

## 2022-06-21 ENCOUNTER — Other Ambulatory Visit: Payer: Self-pay | Admitting: Family Medicine

## 2022-06-23 ENCOUNTER — Ambulatory Visit (INDEPENDENT_AMBULATORY_CARE_PROVIDER_SITE_OTHER): Payer: Medicare Other | Admitting: Family Medicine

## 2022-06-23 ENCOUNTER — Encounter: Payer: Self-pay | Admitting: Family Medicine

## 2022-06-23 ENCOUNTER — Ambulatory Visit (INDEPENDENT_AMBULATORY_CARE_PROVIDER_SITE_OTHER): Payer: Medicare Other

## 2022-06-23 VITALS — BP 137/71 | HR 53 | Temp 97.3°F | Ht 60.0 in | Wt 159.8 lb

## 2022-06-23 DIAGNOSIS — E119 Type 2 diabetes mellitus without complications: Secondary | ICD-10-CM | POA: Diagnosis not present

## 2022-06-23 DIAGNOSIS — W57XXXA Bitten or stung by nonvenomous insect and other nonvenomous arthropods, initial encounter: Secondary | ICD-10-CM | POA: Diagnosis not present

## 2022-06-23 DIAGNOSIS — Z79899 Other long term (current) drug therapy: Secondary | ICD-10-CM | POA: Diagnosis not present

## 2022-06-23 DIAGNOSIS — M8580 Other specified disorders of bone density and structure, unspecified site: Secondary | ICD-10-CM | POA: Diagnosis not present

## 2022-06-23 DIAGNOSIS — M8588 Other specified disorders of bone density and structure, other site: Secondary | ICD-10-CM | POA: Diagnosis not present

## 2022-06-23 DIAGNOSIS — E785 Hyperlipidemia, unspecified: Secondary | ICD-10-CM

## 2022-06-23 DIAGNOSIS — Z780 Asymptomatic menopausal state: Secondary | ICD-10-CM

## 2022-06-23 DIAGNOSIS — I152 Hypertension secondary to endocrine disorders: Secondary | ICD-10-CM

## 2022-06-23 DIAGNOSIS — Z0001 Encounter for general adult medical examination with abnormal findings: Secondary | ICD-10-CM

## 2022-06-23 DIAGNOSIS — E1159 Type 2 diabetes mellitus with other circulatory complications: Secondary | ICD-10-CM

## 2022-06-23 DIAGNOSIS — Z5181 Encounter for therapeutic drug level monitoring: Secondary | ICD-10-CM

## 2022-06-23 DIAGNOSIS — Z0000 Encounter for general adult medical examination without abnormal findings: Secondary | ICD-10-CM

## 2022-06-23 DIAGNOSIS — F5104 Psychophysiologic insomnia: Secondary | ICD-10-CM | POA: Diagnosis not present

## 2022-06-23 DIAGNOSIS — S20461A Insect bite (nonvenomous) of right back wall of thorax, initial encounter: Secondary | ICD-10-CM

## 2022-06-23 DIAGNOSIS — E1169 Type 2 diabetes mellitus with other specified complication: Secondary | ICD-10-CM | POA: Diagnosis not present

## 2022-06-23 LAB — BAYER DCA HB A1C WAIVED: HB A1C (BAYER DCA - WAIVED): 5.3 % (ref 4.8–5.6)

## 2022-06-23 MED ORDER — BELSOMRA 5 MG PO TABS: 5.0000 mg | ORAL_TABLET | Freq: Every evening | ORAL | 0 refills | Status: DC | PRN

## 2022-06-23 MED ORDER — BELSOMRA 10 MG PO TABS: 10.0000 mg | ORAL_TABLET | Freq: Every evening | ORAL | 0 refills | Status: DC | PRN

## 2022-06-23 MED ORDER — DOXYCYCLINE HYCLATE 100 MG PO TABS: 100.0000 mg | ORAL_TABLET | Freq: Two times a day (BID) | ORAL | 0 refills | Status: AC

## 2022-06-23 NOTE — Patient Instructions (Signed)
Taper OFF the Amitriptyline.  Take 1/2 tablet daily x3 weeks OR if it is too difficult to cut in half, take 1 tablet EVERY OTHER day x3 weeks.  Then stop.  Belsomra sent to pharmacy for sleep.  Take 1 tablet nightly.   I have sent '10mg'$  as well that you can increase to next month if the '5mg'$  is not helpful.  Doxycyline for the tick.  Take WITH FOOD!

## 2022-06-23 NOTE — Progress Notes (Signed)
Melissa Bowman is a 71 y.o. female presents to office today for annual physical exam examination.    Concerns today include: 1.  Sleep Patient reports difficulty with sleep despite use of amitriptyline.  She wonders if she should increase the dose.  She is concurrently treated with Lexapro, Abilify.  She denies any further use of tizanidine.  Occupation: Retired, Marital status: Married, Substance use: None Diet: Fair, Exercise: No structured Last eye exam: Up-to-date.  Just got new glasses Last colonoscopy: Up-to-date Last mammogram: Up-to-date Last pap smear: N/A Refills needed today: None Immunizations needed: Immunization History  Administered Date(s) Administered   Fluad Quad(high Dose 65+) 10/30/2019, 09/02/2020, 12/01/2021   Influenza, High Dose Seasonal PF 10/06/2018   Influenza,inj,Quad PF,6+ Mos 09/28/2013, 09/27/2014, 10/15/2015, 11/03/2016   Moderna Sars-Covid-2 Vaccination 02/26/2020, 03/25/2020, 11/04/2020   Pneumococcal Conjugate-13 05/25/2018   Pneumococcal Polysaccharide-23 10/30/2019   Tdap 09/27/2014   Zoster Recombinat (Shingrix) 02/07/2019, 08/03/2021   Zoster, Live 01/31/2013     Past Medical History:  Diagnosis Date   Allergic rhinitis    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 pylori infection 11/16/1993   Hiatal hernia    History of benign adrenal tumor 2007   [facility]   History of pneumonia 2007, 2008   Hyperlipidemia    Hypertension    IBS (irritable bowel syndrome)    Internal hemorrhoids    Kyphosis    Memory loss    Menopause    Metabolic syndrome    OSA on CPAP    Type 2 diabetes mellitus (HCC)    Vitamin D deficiency    Social History   Socioeconomic History   Marital status: Married    Spouse name: [name]   Number of children: 2   Years of education: some  college   Highest education level: Some college, no degree  Occupational History   Occupation: retired    Comment: daycare  Tobacco Use   Smoking status: Never   Smokeless tobacco: Never  Vaping Use   Vaping Use: Never used  Substance and Sexual Activity   Alcohol use: No   Drug use: No   Sexual activity: Not on file  Other Topics Concern   Not on file  Social History Narrative   Marabelle is retired. She lives with her husband [name]. They have two grown daughters that live locally. Ellyson enjoys reading and puzzles.    Social Determinants of Health   Financial Resource Strain: Low Risk  (04/22/2021)   Overall Financial Resource Strain (CARDIA)    Difficulty of Paying Living Expenses: Not hard at all  Food Insecurity: No Food Insecurity (04/22/2021)   Hunger Vital Sign    Worried About Running Out of Food in the Last Year: Never true    Ran Out of Food in the Last Year: Never true  Transportation Needs: No Transportation Needs (04/22/2021)   PRAPARE - Hydrologist (Medical): No    Lack of Transportation (Non-Medical): No  Physical Activity: Insufficiently Active (04/23/2022)   Exercise Vital Sign    Days of Exercise per Week: 7 days    Minutes of Exercise per Session: 20 min  Stress: No Stress Concern Present (04/23/2022)   [place]  Feeling of Stress : Only a little  Social Connections: Moderately Isolated (04/22/2021)   Social Connection and Isolation Panel [NHANES]    Frequency of Communication with Friends and Family: More than three times a week    Frequency of Social Gatherings with Friends and Family: More than three times a week    Attends Religious Services: Never    Marine scientist or Organizations: No    Attends Archivist Meetings: Never    Marital Status: Married  Human resources officer Violence: Not At Risk (04/22/2021)   Humiliation, Afraid, Rape, and Kick  questionnaire    Fear of Current or Ex-Partner: No    Emotionally Abused: No    Physically Abused: No    Sexually Abused: No   Past Surgical History:  Procedure Laterality Date   abdominal wall lematoma of fabdd hys.     APPENDECTOMY     CARPAL TUNNEL RELEASE     CESAREAN SECTION     X2   COLONOSCOPY     INCISIONAL HERNIA REPAIR     right breast biopsy  09/1995   ROTATOR CUFF REPAIR  2002   right   ROTATOR CUFF REPAIR/ bone spur  08/30/2009   SHOULDER ARTHROSCOPY WITH ROTATOR CUFF REPAIR AND SUBACROMIAL DECOMPRESSION Right 01/19/2018   Procedure: REVISION RIGHT MINI OPEN ROTATOR CUFF REPAIR AND SUBACROMIAL DECOMPRESSION;  Surgeon: Susa Day, MD;  Location: [facility];  Service: Orthopedics;  Laterality: Right;  38 MINS   sleep apnea surgery  04/1995   torn cartiledge rt knee x2  05/13/2006   Dr. Ronnie Derby Smoc    TOTAL ABDOMINAL HYSTERECTOMY W/ BILATERAL SALPINGOOPHORECTOMY  07/1993   fibroids    Family History  Problem Relation Age of Onset   Heart disease Mother    Parkinsonism Father    Emphysema Father    Hip fracture Father    Heart failure Sister    Ovarian cancer Sister    COPD Sister    Lymphoma Sister    COPD Sister    COPD Brother    Hypertension Brother    Heart disease Brother    COPD Brother    Colon cancer Neg Hx    Esophageal cancer Neg Hx    Pancreatic cancer Neg Hx    Stomach cancer Neg Hx    Breast cancer Neg Hx     Current Outpatient Medications:    amitriptyline (ELAVIL) 10 MG tablet, TAKE 1 TABLET BY MOUTH AT  BEDTIME, Disp: 90 tablet, Rfl: 0   ARIPiprazole (ABILIFY) 5 MG tablet, TAKE 1 TABLET BY MOUTH DAILY, Disp: 90 tablet, Rfl: 0   Ascorbic Acid (VITAMIN C) 1000 MG tablet, Take 1,000 mg by mouth daily., Disp: , Rfl:    aspirin EC 81 MG tablet, Take 1 tablet (81 mg total) by mouth daily., Disp: , Rfl:    Calcium Carb-Cholecalciferol 600-800 MG-UNIT TABS, Take 2 tablets by mouth daily., Disp: , Rfl:    Cholecalciferol (VITAMIN D-3) 5000 UNITS  TABS, Take 5,000 Units by mouth daily., Disp: , Rfl:    escitalopram (LEXAPRO) 20 MG tablet, TAKE 1 TABLET BY MOUTH  DAILY, Disp: 90 tablet, Rfl: 1   fenofibrate 160 MG tablet, TAKE 1 TABLET BY MOUTH DAILY, Disp: 90 tablet, Rfl: 0   irbesartan-hydrochlorothiazide (AVALIDE) 150-12.5 MG tablet, TAKE 1 TABLET BY MOUTH DAILY, Disp: 90 tablet, Rfl: 1   montelukast (SINGULAIR) 10 MG tablet, TAKE 1 TABLET BY MOUTH DAILY, Disp: 100 tablet, Rfl: 2   omeprazole (PRILOSEC) 40  MG capsule, TAKE 1 CAPSULE BY MOUTH  DAILY, Disp: 100 capsule, Rfl: 0   fluticasone (FLONASE) 50 MCG/ACT nasal spray, Use 2 sprays in each  nostril daily (Patient not taking: Reported on 04/23/2022), Disp: 48 g, Rfl: 2   lidocaine (XYLOCAINE) 5 % ointment, Apply 1 application topically as needed. (Patient not taking: Reported on 04/23/2022), Disp: 35.44 g, Rfl: 0   PROAIR HFA 108 (90 BASE) MCG/ACT inhaler, Use 2 puffs into the lungs  every 6 hours as needed for wheezing or shortness of  breath (Patient not taking: Reported on 04/23/2022), Disp: 25.5 g, Rfl: 2   tiZANidine (ZANAFLEX) 4 MG tablet, Take 1 tablet (4 mg total) by mouth every 6 (six) hours as needed for muscle spasms. (Patient not taking: Reported on 04/23/2022), Disp: 30 tablet, Rfl: 0   valACYclovir (VALTREX) 1000 MG tablet, 2 po BID for 1 day at fever blister onset. May repeat if reoccurs (Patient not taking: Reported on 04/23/2022), Disp: 12 tablet, Rfl: 0  Current Facility-Administered Medications:    0.9 %  sodium chloride infusion, 500 mL, Intravenous, Once, Daryel November, MD  Allergies  Allergen Reactions   Statins Other (See Comments)    myalgias   E-Mycin [Erythromycin] Other (See Comments)    Medication intolerance stomach cramps   Estrogens Other (See Comments)    Estrogen replacement Therapy intolerance mood changes   Sulfa Antibiotics Other (See Comments)    Causes severe stomach cramps    Vioxx [Rofecoxib] Other (See Comments)    Unknown  reaction/interaction. Possible stomach cramps     ROS: Review of Systems A comprehensive review of systems was negative except for: Ears, nose, mouth, throat, and face: positive for hearing loss Hematologic/lymphatic: positive for multiple pigmented skin lesions Behavioral/Psych: positive for sleep disturbance    Physical exam BP 137/71   Pulse (!) 53   Temp (!) 97.3 F (36.3 C)   Ht 5' (1.524 m)   Wt 159 lb 12.8 oz (72.5 kg)   SpO2 98%   BMI 31.21 kg/m  General appearance: alert, cooperative, appears stated age, no distress, and moderately obese Head: Normocephalic, without obvious abnormality, atraumatic Eyes: negative findings: lids and lashes normal, conjunctivae and sclerae normal, corneas clear, pupils equal, round, reactive to light and accomodation, and tearing of right eye noted today Ears: normal TM's and external ear canals both ears Nose: Nares normal. Septum midline. Mucosa normal. No drainage or sinus tenderness. Throat: lips, mucosa, and tongue normal; teeth and gums normal Neck: no adenopathy, supple, symmetrical, trachea midline, thyroid not enlarged, symmetric, no tenderness/mass/nodules, and shortened neck Back: symmetric, no curvature. ROM normal. No CVA tenderness. Lungs: clear to auscultation bilaterally Heart: regular rate and rhythm, S1, S2 normal, no murmur, click, rub or gallop Abdomen: soft, non-tender; bowel sounds normal; no masses,  no organomegaly Extremities: extremities normal, atraumatic, no cyanosis or edema Pulses: 2+ and symmetric Skin:  Multiple pigmented nevi.  She has multiple seborrheic keratoses noted throughout the trunk.  Multiple cherry nevi.  She actually has a white spot a tick attached on the right flank.  No target lesions appreciated Lymph nodes: Cervical, supraclavicular, and axillary nodes normal. Neurologic: Follows commands.  Patellar DTRs symmetric bilaterally.  She has difficulty hearing as well as some delay in speech but  speech is otherwise normal    06/23/2022    9:25 AM 02/03/2022   10:09 AM 08/03/2021   10:27 AM  Depression screen PHQ 2/9  Decreased Interest 0 0 0  Down, Depressed,  Hopeless 0 0 0  PHQ - 2 Score 0 0 0      06/23/2022    9:25 AM 02/03/2022   10:09 AM 08/03/2021   10:27 AM 05/01/2021    8:32 AM  GAD 7 : Generalized Anxiety Score  Nervous, Anxious, on Edge 0 0 0 0  Control/stop worrying 0 0 0 1  Worry too much - different things 0 0 0 1  Trouble relaxing 0 0 0 1  Restless 0 0 0 0  Easily annoyed or irritable 0 0 0 0  Afraid - awful might happen 0 0 0 0  Total GAD 7 Score 0 0 0 3  Anxiety Difficulty Not difficult at all Not difficult at all Not difficult at all Not difficult at all     Assessment/ Plan: Melissa Bowman here for annual physical exam.   Annual physical exam - Plan: EKG 12-Lead  Diet-controlled diabetes mellitus ([place]) - Plan: Bayer DCA Hb A1c Waived  Hypertension associated with diabetes ([place])  Hyperlipidemia associated with type 2 diabetes mellitus ([place])  High risk medication use - Plan: EKG 12-Lead  Medication monitoring encounter - Plan: EKG 12-Lead  Osteopenia after menopause - Plan: DG [facility] DEXA  Psychophysiological insomnia - Plan: Suvorexant (BELSOMRA) 5 MG TABS, Suvorexant (BELSOMRA) 10 MG TABS  Tick bite of right back wall of thorax, initial encounter - Plan: doxycycline (VIBRA-TABS) 100 MG tablet  Sugar remains under excellent control.  Blood pressure controlled.  No changes  Not yet due for fasting lipid.  Statin intolerant.  Continue fenofibrate  EKG was obtained to evaluate given use of multiple QTc prolonging medications including Elavil, Abilify, Lexapro.  QTc was normal.  We will wean from the Elavil since this was primarily being used for sleep and has high risk in this elderly patient.  She will try and cut it down to 5 mg nightly for the next 3 weeks or otherwise will take it every other night for 3 weeks at 10 mg.  She will then stop.   I will cross-taper her with low-dose Belsomra in efforts to sleep.  5 mg have been sent to pharmacy and she may advance to 10 mg in 1 month if she finds the 5 mg insufficient.  National narcotic database reviewed and there were no red flags.  I would like her to follow-up in 6 weeks with regards to sleep and we completed controlled substance contract, urine drug screen and extend out her prescription if she finds that to be helpful  Physical exam was remarkable for a tick bite on the right flank today.  Since the tick was alive and had been attached for an unknown amount of time I have gone ahead and treat her with doxycycline.  We discussed need for consumption of food prior to use of this medicine so as not to precipitate nausea.  She voiced good understanding of follow-up as needed on this issue  DEXA scan also updated to monitor osteopenia noted on 2019 scan  Counseled on healthy lifestyle choices, including diet (rich in fruits, vegetables and lean meats and low in salt and simple carbohydrates) and exercise (at least 30 minutes of moderate physical activity daily).     Horacio Werth M. Lajuana Ripple, DO

## 2022-06-25 DIAGNOSIS — M85852 Other specified disorders of bone density and structure, left thigh: Secondary | ICD-10-CM | POA: Diagnosis not present

## 2022-06-25 DIAGNOSIS — M85851 Other specified disorders of bone density and structure, right thigh: Secondary | ICD-10-CM | POA: Diagnosis not present

## 2022-07-08 DIAGNOSIS — H04223 Epiphora due to insufficient drainage, bilateral lacrimal glands: Secondary | ICD-10-CM | POA: Diagnosis not present

## 2022-07-08 DIAGNOSIS — H04553 Acquired stenosis of bilateral nasolacrimal duct: Secondary | ICD-10-CM | POA: Diagnosis not present

## 2022-07-09 ENCOUNTER — Other Ambulatory Visit: Payer: Self-pay | Admitting: Family Medicine

## 2022-07-13 ENCOUNTER — Other Ambulatory Visit: Payer: Self-pay | Admitting: Family Medicine

## 2022-07-19 ENCOUNTER — Encounter: Payer: Self-pay | Admitting: Family Medicine

## 2022-07-24 ENCOUNTER — Other Ambulatory Visit: Payer: Self-pay | Admitting: Family Medicine

## 2022-07-26 ENCOUNTER — Other Ambulatory Visit: Payer: Self-pay | Admitting: Family Medicine

## 2022-08-03 ENCOUNTER — Ambulatory Visit (INDEPENDENT_AMBULATORY_CARE_PROVIDER_SITE_OTHER): Payer: Medicare Other | Admitting: Family Medicine

## 2022-08-03 ENCOUNTER — Encounter: Payer: Self-pay | Admitting: Family Medicine

## 2022-08-03 VITALS — BP 148/71 | HR 58 | Temp 97.5°F | Ht 61.0 in | Wt 158.6 lb

## 2022-08-03 DIAGNOSIS — F5104 Psychophysiologic insomnia: Secondary | ICD-10-CM

## 2022-08-03 DIAGNOSIS — F419 Anxiety disorder, unspecified: Secondary | ICD-10-CM

## 2022-08-03 DIAGNOSIS — F32A Depression, unspecified: Secondary | ICD-10-CM | POA: Diagnosis not present

## 2022-08-03 MED ORDER — RAMELTEON 8 MG PO TABS: 8.0000 mg | ORAL_TABLET | Freq: Every day | ORAL | 3 refills | Status: DC

## 2022-08-03 NOTE — Patient Instructions (Signed)
Going to try and prescribed Ramelteon for sleep. If your insurance does not cover, I will see if maybe increasing the Belsomra dose will help.  I've placed a referral to Dr Nehemiah Settle for med assistance as well.

## 2022-08-03 NOTE — Progress Notes (Signed)
Subjective: CC: Insomnia PCP: Janora Norlander, DO ENI:DPOEUM F Lippe is a 71 y.o. female presenting to clinic today for:  1.  Insomnia associate with anxiety and depression Patient previously on tricyclic antidepressants.  This was discontinued given its high risk, particularly in conjunction with Abilify, Lexapro.  She has come off of the amitriptyline and is now being treated with Belsomra.  She notes that even after advancing to 10 mg daily she did not find that this has been helpful.  She has not slept well, citing that she frequently wakes up in the middle the night.   ROS: Per HPI  Allergies  Allergen Reactions   Statins Other (See Comments)    myalgias   E-Mycin [Erythromycin] Other (See Comments)    Medication intolerance stomach cramps   Estrogens Other (See Comments)    Estrogen replacement Therapy intolerance mood changes   Sulfa Antibiotics Other (See Comments)    Causes severe stomach cramps    Vioxx [Rofecoxib] Other (See Comments)    Unknown reaction/interaction. 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 pylori infection 11/16/1993   Hiatal hernia    History of benign adrenal tumor 2007   [facility]   History of pneumonia 2007, 2008   Hyperlipidemia    Hypertension    IBS (irritable bowel syndrome)    Internal hemorrhoids    Kyphosis    Memory loss    Menopause    Metabolic syndrome    OSA on CPAP    Type 2 diabetes mellitus (HCC)    Vitamin D deficiency     Current Outpatient Medications:    ARIPiprazole (ABILIFY) 5 MG tablet, TAKE 1 TABLET BY MOUTH DAILY, Disp: 90 tablet, Rfl: 0   Ascorbic Acid (VITAMIN C) 1000 MG tablet, Take 1,000 mg by mouth daily., Disp: , Rfl:    aspirin EC 81 MG tablet,  Take 1 tablet (81 mg total) by mouth daily., Disp: , Rfl:    Calcium Carb-Cholecalciferol 600-800 MG-UNIT TABS, Take 2 tablets by mouth daily., Disp: , Rfl:    Cholecalciferol (VITAMIN D-3) 5000 UNITS TABS, Take 5,000 Units by mouth daily., Disp: , Rfl:    escitalopram (LEXAPRO) 20 MG tablet, Take 1 tablet (20 mg total) by mouth daily., Disp: 90 tablet, Rfl: 0   fenofibrate 160 MG tablet, TAKE 1 TABLET BY MOUTH DAILY, Disp: 90 tablet, Rfl: 0   fluticasone (FLONASE) 50 MCG/ACT nasal spray, Use 2 sprays in each  nostril daily, Disp: 48 g, Rfl: 2   irbesartan-hydrochlorothiazide (AVALIDE) 150-12.5 MG tablet, TAKE 1 TABLET BY MOUTH DAILY, Disp: 90 tablet, Rfl: 1   montelukast (SINGULAIR) 10 MG tablet, TAKE 1 TABLET BY MOUTH DAILY, Disp: 100 tablet, Rfl: 2   omeprazole (PRILOSEC) 40 MG capsule, TAKE 1 CAPSULE BY MOUTH DAILY, Disp: 100 capsule, Rfl: 1   PROAIR HFA 108 (90 BASE) MCG/ACT inhaler, Use 2 puffs into the lungs  every 6 hours as needed for wheezing or shortness of  breath, Disp: 25.5 g, Rfl: 2   ramelteon (ROZEREM) 8 MG tablet, Take 1 tablet (8 mg total) by mouth at bedtime. For sleep, Disp: 90 tablet, Rfl: 3   Suvorexant (BELSOMRA) 10  MG TABS, Take 10 mg by mouth at bedtime as needed., Disp: 30 tablet, Rfl: 0   valACYclovir (VALTREX) 1000 MG tablet, 2 po BID for 1 day at fever blister onset. May repeat if reoccurs, Disp: 12 tablet, Rfl: 0   Suvorexant (BELSOMRA) 5 MG TABS, Take 5 mg by mouth at bedtime as needed (for sleep). May go up to '10mg'$  if '5mg'$  not helpful after 1 month (Patient not taking: Reported on 08/03/2022), Disp: 30 tablet, Rfl: 0  Current Facility-Administered Medications:    0.9 %  sodium chloride infusion, 500 mL, Intravenous, Once, Daryel November, MD Social History   Socioeconomic History   Marital status: Married    Spouse name: [name]   Number of children: 2   Years of education: some college   Highest education level: Some college, no degree  Occupational History    Occupation: retired    Comment: daycare  Tobacco Use   Smoking status: Never   Smokeless tobacco: Never  Scientific laboratory technician Use: Never used  Substance and Sexual Activity   Alcohol use: No   Drug use: No   Sexual activity: Not on file  Other Topics Concern   Not on file  Social History Narrative   Micheala is retired. She lives with her husband [name]. They have two grown daughters that live locally. Hazeline enjoys reading and puzzles.    Social Determinants of Health   Financial Resource Strain: Low Risk  (04/22/2021)   Overall Financial Resource Strain (CARDIA)    Difficulty of Paying Living Expenses: Not hard at all  Food Insecurity: No Food Insecurity (04/22/2021)   Hunger Vital Sign    Worried About Running Out of Food in the Last Year: Never true    Ran Out of Food in the Last Year: Never true  Transportation Needs: No Transportation Needs (04/22/2021)   PRAPARE - Hydrologist (Medical): No    Lack of Transportation (Non-Medical): No  Physical Activity: Insufficiently Active (04/23/2022)   Exercise Vital Sign    Days of Exercise per Week: 7 days    Minutes of Exercise per Session: 20 min  Stress: No Stress Concern Present (04/23/2022)   [place]    Feeling of Stress : Only a little  Social Connections: Moderately Isolated (04/22/2021)   Social Connection and Isolation Panel [NHANES]    Frequency of Communication with Friends and Family: More than three times a week    Frequency of Social Gatherings with Friends and Family: More than three times a week    Attends Religious Services: Never    Marine scientist or Organizations: No    Attends Archivist Meetings: Never    Marital Status: Married  Human resources officer Violence: Not At Risk (04/22/2021)   Humiliation, Afraid, Rape, and Kick questionnaire    Fear of Current or Ex-Partner: No    Emotionally Abused: No     Physically Abused: No    Sexually Abused: No   Family History  Problem Relation Age of Onset   Heart disease Mother    Parkinsonism Father    Emphysema Father    Hip fracture Father    Heart failure Sister    Ovarian cancer Sister    COPD Sister    Lymphoma Sister    COPD Sister    COPD Brother    Hypertension Brother    Heart disease Brother    COPD  Brother    Colon cancer Neg Hx    Esophageal cancer Neg Hx    Pancreatic cancer Neg Hx    Stomach cancer Neg Hx    Breast cancer Neg Hx     Objective: Office vital signs reviewed. BP (!) 148/71   Pulse (!) 58   Temp (!) 97.5 F (36.4 C) (Temporal)   Ht '5\' 1"'$  (1.549 m)   Wt 158 lb 9.6 oz (71.9 kg)   SpO2 97%   BMI 29.97 kg/m   Physical Examination:  General: Awake, alert, nontoxic female, No acute distress HEENT: Sclera white Cardio: regular rate and rhythm  Pulm: Normal breathing on room air Neuro: Lip quiver and fasciculation appreciated  Assessment/ Plan: 71 y.o. female   Psychophysiological insomnia - Plan: ramelteon (ROZEREM) 8 MG tablet, Ambulatory referral to Psychiatry  Anxiety and depression - Plan: Ambulatory referral to Psychiatry  We will try her on ramelteon instead.  If not covered we will advance Belsomra to 15 mg.  Referral to psychiatry also placed.  I think that her meds in general need a revisit.  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  She was agreeable to referral  Orders Placed This Encounter  Procedures   Ambulatory referral to Psychiatry    Referral Priority:   Routine    Referral Type:   Psychiatric    Referral Reason:   Specialty Services Required    Requested Specialty:   Psychiatry    Number of Visits Requested:   1   Meds ordered this encounter  Medications   ramelteon (ROZEREM) 8 MG tablet    Sig: Take 1 tablet (8 mg total) by mouth at bedtime. For sleep    Dispense:  90 tablet     Refill:  3    Failed Belsomra, TCA. Cannot take Trazodone due to QTc prolongation     Janora Norlander, DO [place] [phone]

## 2022-08-11 ENCOUNTER — Encounter: Payer: Self-pay | Admitting: Family Medicine

## 2022-08-17 DIAGNOSIS — G4733 Obstructive sleep apnea (adult) (pediatric): Secondary | ICD-10-CM | POA: Diagnosis not present

## 2022-08-18 ENCOUNTER — Telehealth (HOSPITAL_COMMUNITY): Payer: Medicare Other | Admitting: Psychiatry

## 2022-08-19 ENCOUNTER — Telehealth (INDEPENDENT_AMBULATORY_CARE_PROVIDER_SITE_OTHER): Payer: Medicare Other | Admitting: Psychiatry

## 2022-08-19 ENCOUNTER — Encounter (HOSPITAL_COMMUNITY): Payer: Self-pay | Admitting: Psychiatry

## 2022-08-19 DIAGNOSIS — F3342 Major depressive disorder, recurrent, in full remission: Secondary | ICD-10-CM | POA: Insufficient documentation

## 2022-08-19 DIAGNOSIS — F3341 Major depressive disorder, recurrent, in partial remission: Secondary | ICD-10-CM | POA: Diagnosis not present

## 2022-08-19 DIAGNOSIS — Z79899 Other long term (current) drug therapy: Secondary | ICD-10-CM

## 2022-08-19 DIAGNOSIS — G2401 Drug induced subacute dyskinesia: Secondary | ICD-10-CM

## 2022-08-19 DIAGNOSIS — R9431 Abnormal electrocardiogram [ECG] [EKG]: Secondary | ICD-10-CM

## 2022-08-19 DIAGNOSIS — G4737 Central sleep apnea in conditions classified elsewhere: Secondary | ICD-10-CM

## 2022-08-19 DIAGNOSIS — G4700 Insomnia, unspecified: Secondary | ICD-10-CM

## 2022-08-19 DIAGNOSIS — F330 Major depressive disorder, recurrent, mild: Secondary | ICD-10-CM | POA: Insufficient documentation

## 2022-08-19 HISTORY — DX: Other long term (current) drug therapy: Z79.899

## 2022-08-19 HISTORY — DX: Abnormal electrocardiogram (ECG) (EKG): R94.31

## 2022-08-19 MED ORDER — MIRTAZAPINE 15 MG PO TABS: 7.5000 mg | ORAL_TABLET | Freq: Every day | ORAL | 0 refills | Status: DC

## 2022-08-19 NOTE — Progress Notes (Signed)
Psychiatric Initial Adult Assessment  Patient Identification: Melissa Bowman MRN:  [id number] Date of Evaluation:  08/19/2022 Referral Source: PCP  Assessment:  Melissa Bowman is a 71 y.o. y.o. female with a history of major depressive disorder, anxiety, insomnia, neuroleptic tardive dyskinesia, bradycardia, and central sleep apnea who presents to [facility] via video conferencing for initial evaluation of insomnia.  Patient reports recent worsening of her sleep after discontinuation of amitriptyline due to possible drug interaction with her heart. She is able to fall asleep readily but wakes repeatedly during the night with early awakening. Her CPAP was adjusted in March by her provider and her husband fine tunes at home. Trials of belsombra and ramelteon proved ineffective. Mirtazapine will have similar receptor profile to amitripyline at the histamine receptor as well as slightly varied effect at the serotonin and norepinephrine receptors. Will try this as next trial of sleep aid. Ultimately, the goal will be to discontinue patient's abilify as her depression appears to be in remission outside of the symptoms of insomnia and some decreased appetite. We did discuss possibility that patient's worsening sleep could also be related to CPAP settings but will attempt medication changes first since this was recently adjusted as above. It does appear that she is having neuroleptic induced tardive dyskinesia. Hopefully once mirtazapine is on board, this can be an alternate augmentation strategy for her depression treatment in place of abilify. She also appears to have Qtc prolongation along with sinus bradycardia on independent review of ecg from 06/2022 which is another reason to ultimately discontinue the abilify as outlined in plan. It is unclear if patient's delay in response was completely due to difficulty hearing vs some delay from abilify use vs early stages of a neurocognitive  disorder. Increased concern for the latter due to patient's age, prolonged use of amitriptyline which has a higher anticholinergic load, and frequent checking with husband for answers to some questions during interview. Will try to coordinate getting a MOCA done with PCPs office.   Plan:  # Insomnia  Central sleep apnea Past medication trials: amitriptyline (effective), belsombra, ramelteon Status of problem: new to provider Interventions: -- discontinue ramelteon, belsombra -- start mirtazapine 7.'5mg'$  once nightly -- CMP is up to date with normal sodium  # Major depressive disorder, in partial remission Past medication trials: abilify, lexapro, amitriptyline. Others she cannot remember Status of problem: new to provider Interventions: -- decrease abilify to 2.'5mg'$  for one week with plan to discontinue next week at [facility] check in -- continue escitalopram '20mg'$  daily -- mirtazapine as above  # Neuroleptic induced tardive dyskinesia  long term use of antipsychotic  prolonged Qtc of 448m Past medication trials: none Status of problem: new to provider Interventions: -- plan to discontinue abilify as outlined above -- if still present will consider VMAT2 inhibitor -- ecg, lipid panel and a1c up to date -- will try to obtain repeat ecg likely within the next month  Patient was given contact information for behavioral health clinic and was instructed to call 911 for emergencies.   Subjective:  Chief Complaint:  Chief Complaint  Patient presents with   Insomnia    History of Present Illness:  Husband present for duration of interview; provides assistance with repeating physician's questions (patient hard of hearing). Needs something to help her sleep; falling asleep, stay asleep, and waking up too early. Since stopping amitriptyline around May/June. Has tried belsombra but kept her awake. Melatonin wakes her up a couple times per night. No  nightmares. Getting around 4-5hrs of sleep  per night. Has CPAP with last adjustment in the spring.   Lives at home with husband Melissa Bowman of 51 years. Has chickens for eggs. Have two daughters that live away from home and 5 grandchildren. Everyone gets along. Likes to read for fun, still enjoys some. Eats 3 meals per day but not much of an appetite, normal. Has enough energy. Can concentrate. Has motivation. No guilt feelings. No SI past or present. No HI. No AVH.   Has some worries but not bothersome. No panic attacks. Had period of 2 days with no sleep but felt tired. No excess spending or excess projects.   No alcohol. No smoking. No other drugs. No trauma.   Has been evaluated for Parkinsons and was negative. 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   Called husband separately after patient appointment to provide additional history. In the last year, had a shift where she wanted to stay out of the light and had difficulty with heat. Had a lot of sadness with the death of her daughter. Had some paranoia around others as well which appeared to be more lifelong and did not improve with abilify. MRI from about a year ago did not show strokes or Parkinson's change.  Associated Signs/Symptoms: Depression Symptoms:  insomnia, fatigue, loss of energy/fatigue, disturbed sleep, weight loss, decreased appetite, (Hypo) Manic Symptoms:   none Anxiety Symptoms:   none Psychotic Symptoms:  Hallucinations: None PTSD Symptoms: NA  Past Psychiatric History: see HPI Diagnoses: depression for decades Suicide attempts: none Hospitalizations: none Therapy: did biofeedback many years ago  Previous Psychotropic Medications: Yes   Substance Abuse History in the last 12 months:  No.  Consequences of Substance Abuse: NA  Past Medical History:  Past Medical History:  Diagnosis Date   Allergic rhinitis    Allergy to ertapenem    Anxiety 08/27/2011   Anxiety and depression    Asthmatic bronchitis    Bilateral carpal tunnel syndrome     Condyloma acuminata    Diverticulosis    Eczema    GERD (gastroesophageal reflux disease)    H. pylori infection 11/16/1993   Hiatal hernia    History of benign adrenal tumor 2007   [facility]   History of pneumonia 2007, 2008   Hyperlipidemia    Hypertension    IBS (irritable bowel syndrome)    Internal hemorrhoids    Kyphosis    Memory loss    Menopause    Metabolic syndrome    OSA on CPAP    Type 2 diabetes mellitus ([place])    Vitamin D deficiency     Past Surgical History:  Procedure Laterality Date   abdominal wall lematoma of fabdd hys.     APPENDECTOMY     CARPAL TUNNEL RELEASE     CESAREAN SECTION     X2   COLONOSCOPY     INCISIONAL HERNIA REPAIR     right breast biopsy  09/1995   ROTATOR CUFF REPAIR  2002   right   ROTATOR CUFF REPAIR/ bone spur  08/30/2009   SHOULDER ARTHROSCOPY WITH ROTATOR CUFF REPAIR AND SUBACROMIAL DECOMPRESSION Right 01/19/2018   Procedure: REVISION RIGHT MINI OPEN ROTATOR CUFF REPAIR AND SUBACROMIAL DECOMPRESSION;  Surgeon: Susa Day, MD;  Location: [facility];  Service: Orthopedics;  Laterality: Right;  90 MINS   sleep apnea surgery  04/1995   torn cartiledge rt knee x2  05/13/2006   Dr. Charlotta Newton    TOTAL  ABDOMINAL HYSTERECTOMY W/ BILATERAL SALPINGOOPHORECTOMY  07/1993   fibroids     Family Psychiatric History: both daughters with depression  Family History:  Family History  Problem Relation Age of Onset   Heart disease Mother    Parkinsonism Father    Emphysema Father    Hip fracture Father    Heart failure Sister    Ovarian cancer Sister    COPD Sister    Lymphoma Sister    COPD Sister    COPD Brother    Hypertension Brother    Heart disease Brother    COPD Brother    Colon cancer Neg Hx    Esophageal cancer Neg Hx    Pancreatic cancer Neg Hx    Stomach cancer Neg Hx    Breast cancer Neg Hx     Social History:   Social History   Socioeconomic History   Marital status: Married    Spouse name: [name]   Number of  children: 2   Years of education: some college   Highest education level: Some college, no degree  Occupational History   Occupation: retired    Comment: daycare  Tobacco Use   Smoking status: Never   Smokeless tobacco: Never  Vaping Use   Vaping Use: Never used  Substance and Sexual Activity   Alcohol use: No   Drug use: No   Sexual activity: Not on file  Other Topics Concern   Not on file  Social History Narrative   Laquida is retired. She lives with her husband [name]. They have two grown daughters that live locally. Keilana enjoys reading and puzzles.    Social Determinants of Health   Financial Resource Strain: Low Risk  (04/22/2021)   Overall Financial Resource Strain (CARDIA)    Difficulty of Paying Living Expenses: Not hard at all  Food Insecurity: No Food Insecurity (04/22/2021)   Hunger Vital Sign    Worried About Running Out of Food in the Last Year: Never true    Ran Out of Food in the Last Year: Never true  Transportation Needs: No Transportation Needs (04/22/2021)   PRAPARE - Hydrologist (Medical): No    Lack of Transportation (Non-Medical): No  Physical Activity: Insufficiently Active (04/23/2022)   Exercise Vital Sign    Days of Exercise per Week: 7 days    Minutes of Exercise per Session: 20 min  Stress: No Stress Concern Present (04/23/2022)   [place]    Feeling of Stress : Only a little  Social Connections: Moderately Isolated (04/22/2021)   Social Connection and Isolation Panel [NHANES]    Frequency of Communication with Friends and Family: More than three times a week    Frequency of Social Gatherings with Friends and Family: More than three times a week    Attends Religious Services: Never    Marine scientist or Organizations: No    Attends Archivist Meetings: Never    Marital Status: Married    Additional Social History: see  HPI  Allergies:   Allergies  Allergen Reactions   Statins Other (See Comments)    myalgias   E-Mycin [Erythromycin] Other (See Comments)    Medication intolerance stomach cramps   Estrogens Other (See Comments)    Estrogen replacement Therapy intolerance mood changes   Sulfa Antibiotics Other (See Comments)    Causes severe stomach cramps    Vioxx [Rofecoxib] Other (See Comments)  Unknown reaction/interaction. Possible stomach cramps    Current Medications: Current Outpatient Medications  Medication Sig Dispense Refill   ARIPiprazole (ABILIFY) 5 MG tablet TAKE 1 TABLET BY MOUTH DAILY 90 tablet 0   Ascorbic Acid (VITAMIN C) 1000 MG tablet Take 1,000 mg by mouth daily.     aspirin EC 81 MG tablet Take 1 tablet (81 mg total) by mouth daily.     Calcium Carb-Cholecalciferol 600-800 MG-UNIT TABS Take 2 tablets by mouth daily.     Cholecalciferol (VITAMIN D-3) 5000 UNITS TABS Take 5,000 Units by mouth daily.     escitalopram (LEXAPRO) 20 MG tablet Take 1 tablet (20 mg total) by mouth daily. 90 tablet 0   fenofibrate 160 MG tablet TAKE 1 TABLET BY MOUTH DAILY 90 tablet 0   fluticasone (FLONASE) 50 MCG/ACT nasal spray Use 2 sprays in each  nostril daily 48 g 2   irbesartan-hydrochlorothiazide (AVALIDE) 150-12.5 MG tablet TAKE 1 TABLET BY MOUTH DAILY 90 tablet 1   montelukast (SINGULAIR) 10 MG tablet TAKE 1 TABLET BY MOUTH DAILY 100 tablet 2   omeprazole (PRILOSEC) 40 MG capsule TAKE 1 CAPSULE BY MOUTH DAILY 100 capsule 1   PROAIR HFA 108 (90 BASE) MCG/ACT inhaler Use 2 puffs into the lungs  every 6 hours as needed for wheezing or shortness of  breath 25.5 g 2   ramelteon (ROZEREM) 8 MG tablet Take 1 tablet (8 mg total) by mouth at bedtime. For sleep 90 tablet 3   Suvorexant (BELSOMRA) 10 MG TABS Take 10 mg by mouth at bedtime as needed. 30 tablet 0   Suvorexant (BELSOMRA) 5 MG TABS Take 5 mg by mouth at bedtime as needed (for sleep). May go up to '10mg'$  if '5mg'$  not helpful after 1 month  (Patient not taking: Reported on 08/03/2022) 30 tablet 0   Current Facility-Administered Medications  Medication Dose Route Frequency Provider Last Rate Last Admin   0.9 %  sodium chloride infusion  500 mL Intravenous Once Daryel November, MD        ROS: Review of Systems  Constitutional:  Positive for appetite change and fatigue.  HENT:  Positive for hearing loss.   Psychiatric/Behavioral:  Positive for sleep disturbance.     Objective:  Psychiatric Specialty Exam: There were no vitals taken for this visit.There is no height or weight on file to calculate BMI.  General Appearance: Casual, Neat, Well Groomed, and wearing glasses  Eye Contact:  Good  Speech:  Clear and Coherent and Normal Rate  Volume:  Normal  Mood:   "I'm having trouble sleeping"  Affect:  Constricted and calm, cooperative. Able to smile.  Thought Process:  Goal Directed and Linear. Concrete  Orientation:  Full (Time, Place, and Person)  Thought Content:  Logical and Hallucinations: None  Suicidal Thoughts:  No  Homicidal Thoughts:  No  Memory:   elements of remote and immediate memory intact. Frequently checked with husband for confirmation of facts  Judgment:  Fair  Insight:  Fair  Psychomotor Activity:   resting orofacial and buccal movements that continued on distraction and intention  Concentration:  Concentration: Fair and Attention Span: Fair  Recall:   not formerly assessed today  Fund of Knowledge:Fair  Language: Good  Akathisia:  No  Handed:  Right  AIMS (if indicated):  done  Assets:  Communication Skills  ADL's:  Intact  Cognition: WNL though not formally assessed today  Sleep:  Poor   PE: General: sits comfortably in view of camera;  no acute distress  Pulm: no increased work of breathing on room air  MSK: all extremity movements appear intact  Neuro: AIMS score of 6: 2 for lips, jaw, and tongue on 08/19/22 Gait & Station: normal gait without antalgia or wide base   Metabolic Disorder  Labs: Lab Results  Component Value Date   HGBA1C 5.3 06/23/2022   MPG 134.11 01/12/2018   No results found for: "PROLACTIN" Lab Results  Component Value Date   CHOL 165 02/03/2022   TRIG 100 02/03/2022   HDL 42 02/03/2022   CHOLHDL 3.9 02/03/2022   LDLCALC 105 (H) 02/03/2022   LDLCALC 83 05/01/2021   Lab Results  Component Value Date   TSH 2.290 01/21/2021    Therapeutic Level Labs: No results found for: "LITHIUM" No results found for: "CBMZ" No results found for: "VALPROATE"  Screenings:  GAD-7    Flowsheet Row Office Visit from 08/03/2022 in [place] Visit from 06/23/2022 in [place] Visit from 02/03/2022 in [place] Office Visit from 08/03/2021 in [place] Visit from 05/01/2021 in [place]  Total GAD-7 Score 0 0 0 0 3      Mini-Mental    Flowsheet Row Clinical Support from 02/07/2019 in [employer]  Total Score (max 30 points ) 30      PHQ2-9    Flowsheet Row Video Visit from 08/19/2022 in [place] Office Visit from 08/03/2022 in [place] Office Visit from 06/23/2022 in [place] Visit from 02/03/2022 in [place] Visit from 08/03/2021 in [place]  PHQ-2 Total Score 0 0 0 0 0  PHQ-9 Total Score 6 3 -- -- --      Flowsheet Row Video Visit from 08/19/2022 in [place] No Risk       Collaboration of Care: Collaboration of Care: Primary Care Provider AEB obtaining [place]  Patient/Guardian was advised Release of Information must be obtained prior to any record release in order to collaborate their care with an outside provider. Patient/Guardian was advised if they have not already  done so to contact the registration department to sign all necessary forms in order for [place] to release information regarding their care.   Consent: Patient/Guardian gives verbal consent for treatment and assignment of benefits for services provided during this visit. Patient/Guardian expressed understanding and agreed to proceed.   Televisit via video: I connected with JADAE STEINKE on 08/19/22 at 10:00 AM EDT by a video enabled telemedicine application and verified that I am speaking with the correct person using two identifiers.  Location: Patient: [facility] Provider: home office   I discussed the limitations of evaluation and management by telemedicine and the availability of in person appointments. The patient expressed understanding and agreed to proceed.  I discussed the assessment and treatment plan with the patient. The patient was provided an opportunity to ask questions and all were answered. The patient agreed with the plan and demonstrated an understanding of the instructions.   The patient was advised to call back or seek an in-person evaluation if the symptoms worsen or if the condition fails to improve as anticipated.  I provided 60 minutes of non-face-to-face time during this encounter. An additional 15 minutes was spent involved in chart review, documentation, and care coordination.   Debby Bud 9/7/202311:15 AM

## 2022-08-19 NOTE — Patient Instructions (Signed)
We made two changes to your medication regimen today. First, decrease your abilify to 2.'5mg'$  daily (cut a '5mg'$  tablet in half). Take this for the next week and once we check in via mychart we will likely discontinue this completely. We also will start mirtazapine 7.'5mg'$  (cut a '15mg'$  tablet in half) once nightly. After two weeks, will check in via mychart and may increase this dose if ineffective for sleep.

## 2022-08-20 NOTE — Progress Notes (Signed)
Pt has been scheduled with dr. Lajuana Ripple

## 2022-08-23 ENCOUNTER — Other Ambulatory Visit: Payer: Self-pay | Admitting: Family Medicine

## 2022-08-30 NOTE — Progress Notes (Signed)
[facility] Quality Team Note  Name: MARIAN MENEELY Date of Birth: 1950-12-30 MRN: [id number] Date: 08/30/2022  [facility] Quality Team has reviewed this patient's chart, please see recommendations below:  [facility] Quality Other; (KED: Kidney Health Evaluation Gap- Patient needs Urine Albumin Creatinine Ratio Test completed for gap closure. EGFR has already been completed, Patient has upcoming appointment with [facility] 09/10/2022.)

## 2022-08-31 ENCOUNTER — Telehealth (HOSPITAL_COMMUNITY): Payer: Self-pay

## 2022-08-31 NOTE — Telephone Encounter (Signed)
Contacted [facility] and confirmed that they received the presciption. They wanted to confirm that she was no longer taking Elavil 10 mg due to drug interaction with Remeron. I confirmed that Ronnie Doss, MD discontinued the medication on 06/23/22. Hilda Blades, Pharmacists with [facility] confirmed and will process the RX. Pts husband was notified.

## 2022-08-31 NOTE — Telephone Encounter (Signed)
Pt not sleeping and not having a reaction to halving the medication he was asked to halve for her. [place] to get an update on Rx. Pts husband states that he has not got meds yet. He says to send Rx to [facility] or [facility] in [place], [place]  if [facility] cant process.

## 2022-09-01 ENCOUNTER — Encounter (HOSPITAL_COMMUNITY): Payer: Self-pay

## 2022-09-01 NOTE — Telephone Encounter (Signed)
Please send this to Dr. Nehemiah Settle

## 2022-09-07 ENCOUNTER — Telehealth: Payer: Self-pay | Admitting: *Deleted

## 2022-09-07 NOTE — Telephone Encounter (Signed)
Pt called in saying she got a call that she needed her A1C checked, which usually runs about 5.2, she is not sure who called her and I do not see any documentation. She does have an appt this Friday 9/29 at 3 pm w/ her PCP. I added this to her appt notes just in case. Sending message to PCP to make her aware.

## 2022-09-10 ENCOUNTER — Ambulatory Visit (INDEPENDENT_AMBULATORY_CARE_PROVIDER_SITE_OTHER): Payer: Medicare Other | Admitting: Family Medicine

## 2022-09-10 ENCOUNTER — Encounter: Payer: Self-pay | Admitting: Family Medicine

## 2022-09-10 VITALS — BP 148/72 | HR 56 | Temp 97.6°F | Ht 61.0 in | Wt 159.8 lb

## 2022-09-10 DIAGNOSIS — F09 Unspecified mental disorder due to known physiological condition: Secondary | ICD-10-CM | POA: Diagnosis not present

## 2022-09-10 DIAGNOSIS — F5104 Psychophysiologic insomnia: Secondary | ICD-10-CM | POA: Diagnosis not present

## 2022-09-10 DIAGNOSIS — F32A Depression, unspecified: Secondary | ICD-10-CM | POA: Diagnosis not present

## 2022-09-10 DIAGNOSIS — E119 Type 2 diabetes mellitus without complications: Secondary | ICD-10-CM

## 2022-09-10 DIAGNOSIS — F419 Anxiety disorder, unspecified: Secondary | ICD-10-CM

## 2022-09-10 LAB — BAYER DCA HB A1C WAIVED: HB A1C (BAYER DCA - WAIVED): 5.2 % (ref 4.8–5.6)

## 2022-09-10 NOTE — Progress Notes (Signed)
Subjective: CC: Mood, diabetes PCP: Melissa Norlander, Melissa Bowman [id number] F Vo is a 71 y.o. female presenting to clinic today for:  1.  Anxiety, depression, insomnia Patient has started seeing Melissa Bowman in [place].  She was recently started on 7.5 mg of mirtazapine at bedtime.  She is continued on her Lexapro 20 mg and half a tablet of Abilify.  She thinks that the Abilify will be weaned off.  She reports excellent sleep with the addition of the mirtazapine and feels overall very well.  She is very pleased with how things have been going with Dr. Sheran Bowman  2.  Type 2 diabetes Patient is diet-controlled diabetes but would like to get her A1c checked today.  No chest pain, shortness of breath or dizziness reported   ROS: Per HPI  Allergies  Allergen Reactions   Statins Other (See Comments)    myalgias   E-Mycin [Erythromycin] Other (See Comments)    Medication intolerance stomach cramps   Estrogens Other (See Comments)    Estrogen replacement Therapy intolerance mood changes   Sulfa Antibiotics Other (See Comments)    Causes severe stomach cramps    Melissa [Rofecoxib] Other (See Comments)    Unknown reaction/interaction. 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 pylori infection 11/16/1993   Hiatal hernia    History of benign adrenal tumor 2007   [facility]   History of pneumonia 2007, 2008   Hyperlipidemia    Hypertension    IBS (irritable bowel syndrome)    Internal hemorrhoids    Kyphosis    Memory loss    Menopause    Metabolic syndrome    OSA on CPAP    Type 2 diabetes mellitus (HCC)    Vitamin D deficiency     Current Outpatient Medications:    Ascorbic Acid (VITAMIN C) 1000 MG tablet,  Take 1,000 mg by mouth daily., Disp: , Rfl:    aspirin EC 81 MG tablet, Take 1 tablet (81 mg total) by mouth daily., Disp: , Rfl:    Calcium Carb-Cholecalciferol 600-800 MG-UNIT TABS, Take 2 tablets by mouth daily., Disp: , Rfl:    Cholecalciferol (VITAMIN D-3) 5000 UNITS TABS, Take 5,000 Units by mouth daily., Disp: , Rfl:    escitalopram (LEXAPRO) 20 MG tablet, Take 1 tablet (20 mg total) by mouth daily., Disp: 90 tablet, Rfl: 0   fenofibrate 160 MG tablet, TAKE 1 TABLET BY MOUTH DAILY, Disp: 90 tablet, Rfl: 1   fluticasone (FLONASE) 50 MCG/ACT nasal spray, Use 2 sprays in each  nostril daily, Disp: 48 g, Rfl: 2   irbesartan-hydrochlorothiazide (AVALIDE) 150-12.5 MG tablet, TAKE 1 TABLET BY MOUTH DAILY, Disp: 90 tablet, Rfl: 1   mirtazapine (REMERON) 15 MG tablet, Take 0.5 tablets (7.5 mg total) by mouth at bedtime., Disp: 30 tablet, Rfl: 0   montelukast (SINGULAIR) 10 MG tablet, TAKE 1 TABLET BY MOUTH DAILY, Disp: 100 tablet, Rfl: 2   omeprazole (PRILOSEC) 40 MG capsule, TAKE 1 CAPSULE BY MOUTH DAILY, Disp: 100 capsule, Rfl: 1   PROAIR HFA 108 (90 BASE) MCG/ACT inhaler, Use 2 puffs into the lungs  every 6 hours as needed for wheezing  or shortness of  breath, Disp: 25.5 g, Rfl: 2  Current Facility-Administered Medications:    0.9 %  sodium chloride infusion, 500 mL, Intravenous, Once, Melissa Bowman, Melissa Pih, MD Social History   Socioeconomic History   Marital status: Married    Spouse name: Melissa Bowman   Number of children: 2   Years of education: some college   Highest education level: Some college, no degree  Occupational History   Occupation: retired    Comment: daycare  Tobacco Use   Smoking status: Never   Smokeless tobacco: Never  Scientific laboratory technician Use: Never used  Substance and Sexual Activity   Alcohol use: No   Drug use: No   Sexual activity: Not on file  Other Topics Concern   Not on file  Social History Narrative   Melissa Bowman is retired. She lives with her husband Melissa Bowman.  They have two grown daughters that live locally. Melissa Bowman enjoys reading and puzzles.    Social Determinants of Health   Financial Resource Strain: Low Risk  (04/22/2021)   Overall Financial Resource Strain (CARDIA)    Difficulty of Paying Living Expenses: Not hard at all  Food Insecurity: No Food Insecurity (04/22/2021)   Hunger Vital Sign    Worried About Running Out of Food in the Last Year: Never true    Ran Out of Food in the Last Year: Never true  Transportation Needs: No Transportation Needs (04/22/2021)   PRAPARE - Hydrologist (Medical): No    Lack of Transportation (Non-Medical): No  Physical Activity: Insufficiently Active (04/23/2022)   Exercise Vital Sign    Days of Exercise per Week: 7 days    Minutes of Exercise per Session: 20 min  Stress: No Stress Concern Present (04/23/2022)   [place]    Feeling of Stress : Only a little  Social Connections: Moderately Isolated (04/22/2021)   Social Connection and Isolation Panel [NHANES]    Frequency of Communication with Friends and Family: More than three times a week    Frequency of Social Gatherings with Friends and Family: More than three times a week    Attends Religious Services: Never    Marine scientist or Organizations: No    Attends Archivist Meetings: Never    Marital Status: Married  Human resources officer Violence: Not At Risk (04/22/2021)   Humiliation, Afraid, Rape, and Kick questionnaire    Fear of Current or Ex-Partner: No    Emotionally Abused: No    Physically Abused: No    Sexually Abused: No   Family History  Problem Relation Age of Onset   Heart disease Mother    Parkinsonism Father    Emphysema Father    Hip fracture Father    Heart failure Sister    Ovarian cancer Sister    COPD Sister    Lymphoma Sister    COPD Sister    COPD Brother    Hypertension Brother    Heart disease Brother    COPD  Brother    Colon cancer Neg Hx    Esophageal cancer Neg Hx    Pancreatic cancer Neg Hx    Stomach cancer Neg Hx    Breast cancer Neg Hx     Objective: Office vital signs reviewed. BP (!) 148/72   Pulse (!) 56   Temp 97.6 F (36.4 C)   Ht $R'5\' 1"'cc$  (1.549 m)   Wt 159 lb 12.8  oz (72.5 kg)   SpO2 98%   BMI 30.19 kg/m   Physical Examination:  General: Awake, alert, well nourished, No acute distress HEENT: quiver of lip present Cardio: regular rate and rhythm, S1S2 heard, no murmurs appreciated Pulm: clear to auscultation bilaterally, no wheezes, rhonchi or rales; normal work of breathing on room air Psych: brighter and happier appearing today.    Assessment/ Plan: 71 y.o. female   Anxiety and depression - Plan: EKG 12-Lead  Psychophysiological insomnia - Plan: EKG 12-Lead  Diet-controlled diabetes mellitus ([place]) - Plan: EKG 12-Lead, Bayer DCA Hb A1c Waived, CMP14+EGFR  Mild cognitive disorder  Anxiety/ depression and sleep are doing better already.  She's extremely pleased with her visit with Dr Nehemiah Bowman  EKG cc'd to Dr Nehemiah Bowman  MoCA does show MCI.  This has been updated in her file.  Will plan to discuss starting Aricept/ Namenda at her follow up visit.  Would like Dr Glenna Durand opinion on any possible impact of her current meds on her scoring today 23/30.  If no impact, plan to initiate meds.  Orders Placed This Encounter  Procedures   EKG 12-Lead   No orders of the defined types were placed in this encounter.    Melissa Norlander, Melissa Bowman [place] [phone]

## 2022-09-11 LAB — CMP14+EGFR
ALT: 13 IU/L (ref 0–32)
AST: 15 IU/L (ref 0–40)
Albumin/Globulin Ratio: 2.6 — ABNORMAL HIGH (ref 1.2–2.2)
Albumin: 4.6 g/dL (ref 3.8–4.8)
Alkaline Phosphatase: 37 IU/L — ABNORMAL LOW (ref 44–121)
BUN/Creatinine Ratio: 38 — ABNORMAL HIGH (ref 12–28)
BUN: 30 mg/dL — ABNORMAL HIGH (ref 8–27)
Bilirubin Total: 0.3 mg/dL (ref 0.0–1.2)
CO2: 24 mmol/L (ref 20–29)
Calcium: 10 mg/dL (ref 8.7–10.3)
Chloride: 105 mmol/L (ref 96–106)
Creatinine, Ser: 0.8 mg/dL (ref 0.57–1.00)
Globulin, Total: 1.8 g/dL (ref 1.5–4.5)
Glucose: 94 mg/dL (ref 70–99)
Potassium: 3.8 mmol/L (ref 3.5–5.2)
Sodium: 143 mmol/L (ref 134–144)
Total Protein: 6.4 g/dL (ref 6.0–8.5)
eGFR: 79 mL/min/{1.73_m2} (ref >59)

## 2022-09-14 ENCOUNTER — Telehealth: Payer: Self-pay

## 2022-09-14 NOTE — Telephone Encounter (Signed)
Called and scheduled

## 2022-09-15 ENCOUNTER — Encounter: Payer: Self-pay | Admitting: Family Medicine

## 2022-09-15 ENCOUNTER — Ambulatory Visit (INDEPENDENT_AMBULATORY_CARE_PROVIDER_SITE_OTHER): Payer: Medicare Other | Admitting: Family Medicine

## 2022-09-15 DIAGNOSIS — G3184 Mild cognitive impairment, so stated: Secondary | ICD-10-CM | POA: Diagnosis not present

## 2022-09-15 MED ORDER — MEMANTINE HCL 5 MG PO TABS: ORAL_TABLET | ORAL | 0 refills | Status: DC

## 2022-09-15 NOTE — Patient Instructions (Signed)
Dementia Caregiver Guide Dementia is a term used to describe a number of symptoms that affect memory and thinking. The most common symptoms include: Memory loss. Trouble with language and communication. Trouble concentrating. Poor judgment and problems with reasoning. Wandering from home or public places. Extreme anxiety or depression. Being suspicious or having angry outbursts and accusations. Child-like behavior and language. Dementia can be frightening and confusing. And taking care of someone with dementia can be challenging. This guide provides tips to help you when providing care for a person with dementia. How to help manage lifestyle changes Dementia usually gets worse slowly over time. In the early stages, people with dementia can stay independent and safe with some help. In later stages, they need help with daily tasks such as dressing, grooming, and using the bathroom. There are actions you can take to help a person manage his or her life while living with this condition. Communicating When the person is talking or seems frustrated, make eye contact and hold the person's hand. Ask specific questions that need yes or no answers. Use simple words, short sentences, and a calm voice. Only give one direction at a time. When offering choices, limit the person to just one or two. Avoid correcting the person in a negative way. If the person is struggling to find the right words, gently try to help him or her. Preventing injury  Keep floors clear of clutter. Remove rugs, magazine racks, and floor lamps. Keep hallways well lit, especially at night. Put a handrail and nonslip mat in the bathtub or shower. Put childproof locks on cabinets that contain dangerous items, such as medicines, alcohol, guns, toxic cleaning items, sharp tools or utensils, matches, and lighters. For doors to the outside of the house, put the locks in places where the person cannot see or reach them easily. This will  help ensure that the person does not wander out of the house and get lost. Be prepared for emergencies. Keep a list of emergency phone numbers and addresses in a convenient area. Remove car keys and lock garage doors so that the person does not try to get in the car and drive. Have the person wear a bracelet that tracks locations and identifies the person as having memory problems. This should be worn at all times for safety. Helping with daily life  Keep the person on track with his or her routine. Try to identify areas where the person may need help. Be supportive, patient, calm, and encouraging. Gently remind the person that adjusting to changes takes time. Help with the tasks that the person has asked for help with. Keep the person involved in daily tasks and decisions as much as possible. Encourage conversation, but try not to get frustrated if the person struggles to find words or does not seem to appreciate your help. How to recognize stress Look for signs of stress in yourself and in the person you are caring for. If you notice signs of stress, take steps to manage it. Symptoms of stress include: Feeling anxious, irritable, frustrated, or angry. Denying that the person has dementia or that his or her symptoms will not improve. Feeling depressed, hopeless, or unappreciated. Difficulty sleeping. Difficulty concentrating. Developing stress-related health problems. Feeling like you have too little time for your own life. Follow these instructions at home: Take care of your health Make sure that you and the person you are caring for: Get regular sleep. Exercise regularly. Eat regular, nutritious meals. Take over-the-counter and prescription medicines only   as told by your health care providers. Drink enough fluid to keep your urine pale yellow. Attend all scheduled health care appointments.  General instructions Join a support group with others who are caregivers. Ask about  respite care resources. Respite care can provide short-term care for the person so that you can have a regular break from the stress of caregiving. Consider any safety risks and take steps to avoid them. Organize medicines in a pill box for each day of the week. Create a plan to handle any legal or financial matters. Get legal or financial advice if needed. Keep a calendar in a central location to remind the person of appointments or other activities. Where to find support: Many individuals and organizations offer support. These include: Support groups for people with dementia. Support groups for caregivers. Counselors or therapists. Home health care services. Adult day care centers. Where to find more information Centers for Disease Control and Prevention: www.cdc.gov Alzheimer's Association: www.alz.org Family Caregiver Alliance: www.caregiver.org Alzheimer's Foundation of America: www.alzfdn.org Contact a health care provider if: The person's health is rapidly getting worse. You are no longer able to care for the person. Caring for the person is affecting your physical and emotional health. You are feeling depressed or anxious about caring for the person. Get help right away if: The person threatens himself or herself, you, or anyone else. You feel depressed or sad, or feel that you want to harm yourself. If you ever feel like your loved one may hurt himself or herself or others, or if he or she shares thoughts about taking his or her own life, get help right away. You can go to your nearest emergency department or: Call your local emergency services (911 in the [place] Call a suicide crisis helpline, such as the National Suicide Prevention Lifeline at 1-800-273-8255 or 988 in the [place] This is open 24 hours a day in the [place] Text the Crisis Text Line at 741741 (in the [place] Summary Dementia is a term used to describe a number of symptoms that affect memory and thinking. Dementia  usually gets worse slowly over time. Take steps to reduce the person's risk of injury and to plan for future care. Caregivers need support, relief from caregiving, and time for their own lives. This information is not intended to replace advice given to you by your health care provider. Make sure you discuss any questions you have with your health care provider. Document Revised: 06/24/2021 Document Reviewed: 04/14/2020 Elsevier Patient Education  2023 Elsevier Inc.  

## 2022-09-15 NOTE — Progress Notes (Signed)
Telephone visit  Subjective: CC: Dementia PCP: Janora Norlander, DO FBP:ZWCHEN ABBRIELLE BATTS is a 71 y.o. female calls for telephone consult today. Patient provides verbal consent for consult held via phone.  Due to COVID-19 pandemic this visit was conducted virtually. This visit type was conducted due to national recommendations for restrictions regarding the COVID-19 Pandemic (e.g. social distancing, sheltering in place) in an effort to limit this patient's exposure and mitigate transmission in our community. All issues noted in this document were discussed and addressed.  A physical exam was not performed with this format.   Location of patient: home Location of provider: [facility] Others present for call: spouse  1. Dementia Patient noted to have mild cognitive impairment with a MoCA score of 24 on recent examination on 09/10/2022.  We are reaching out to the patient today for discussion of initiation of medication.  She is currently under the care of Dr. Sheran Lawless, psychiatry, and he felt that this was likely an organic issue and unlikely to be severely impacted by her current meds.  He is actively working on modifying medications for her however.  Patient is amenable to starting medications.  She is accompanied today's visit by her spouse, Timmothy Sours.  He certainly agrees that medication should be initiated.   ROS: Per HPI  Allergies  Allergen Reactions   Statins Other (See Comments)    myalgias   E-Mycin [Erythromycin] Other (See Comments)    Medication intolerance stomach cramps   Estrogens Other (See Comments)    Estrogen replacement Therapy intolerance mood changes   Sulfa Antibiotics Other (See Comments)    Causes severe stomach cramps    Vioxx [Rofecoxib] Other (See Comments)    Unknown reaction/interaction. Possible stomach cramps   Past Medical History:  Diagnosis Date   Allergic rhinitis    Allergy to ertapenem    Anxiety 08/27/2011   Anxiety and depression    Asthmatic bronchitis     Bilateral carpal tunnel syndrome    Condyloma acuminata    Diverticulosis    Eczema    GERD (gastroesophageal reflux disease)    H. pylori infection 11/16/1993   Hiatal hernia    History of benign adrenal tumor 2007   [facility]   History of pneumonia 2007, 2008   Hyperlipidemia    Hypertension    IBS (irritable bowel syndrome)    Internal hemorrhoids    Kyphosis    Memory loss    Menopause    Metabolic syndrome    OSA on CPAP    Type 2 diabetes mellitus (HCC)    Vitamin D deficiency     Current Outpatient Medications:    Ascorbic Acid (VITAMIN C) 1000 MG tablet, Take 1,000 mg by mouth daily., Disp: , Rfl:    aspirin EC 81 MG tablet, Take 1 tablet (81 mg total) by mouth daily., Disp: , Rfl:    Calcium Carb-Cholecalciferol 600-800 MG-UNIT TABS, Take 2 tablets by mouth daily., Disp: , Rfl:    Cholecalciferol (VITAMIN D-3) 5000 UNITS TABS, Take 5,000 Units by mouth daily., Disp: , Rfl:    escitalopram (LEXAPRO) 20 MG tablet, Take 1 tablet (20 mg total) by mouth daily., Disp: 90 tablet, Rfl: 0   fenofibrate 160 MG tablet, TAKE 1 TABLET BY MOUTH DAILY, Disp: 90 tablet, Rfl: 1   fluticasone (FLONASE) 50 MCG/ACT nasal spray, Use 2 sprays in each  nostril daily, Disp: 48 g, Rfl: 2   irbesartan-hydrochlorothiazide (AVALIDE) 150-12.5 MG tablet, TAKE 1 TABLET BY MOUTH DAILY, Disp: 90  tablet, Rfl: 1   mirtazapine (REMERON) 15 MG tablet, Take 0.5 tablets (7.5 mg total) by mouth at bedtime., Disp: 30 tablet, Rfl: 0   montelukast (SINGULAIR) 10 MG tablet, TAKE 1 TABLET BY MOUTH DAILY, Disp: 100 tablet, Rfl: 2   omeprazole (PRILOSEC) 40 MG capsule, TAKE 1 CAPSULE BY MOUTH DAILY, Disp: 100 capsule, Rfl: 1   PROAIR HFA 108 (90 BASE) MCG/ACT inhaler, Use 2 puffs into the lungs  every 6 hours as needed for wheezing or shortness of  breath, Disp: 25.5 g, Rfl: 2  Current Facility-Administered Medications:    0.9 %  sodium chloride infusion, 500 mL, Intravenous, Once, Daryel November,  MD  Assessment/ Plan: 71 y.o. female   Mild cognitive impairment - Plan: memantine (NAMENDA) 5 MG tablet  I have consulted our clinical pharmacist with regards to medications for this particular patient.  Aricept is associated with increased psychological impacts in the literature so I have chosen to start with Namenda.  We discussed starting at 5 mg nightly and then advancing to twice daily dosing in 1 month.  We discussed potential side effects and reasons for reevaluation and/or discontinuation.  We discussed that Aricept may need to be added at some point if her symptoms are progressive.  I also discussed with them the nature of dementia and that it is a progressive disease.  The medications are not intended to reverse any memory loss thus far but hopefully to stall progression going forward.  They voiced good understanding and will follow-up as directed  Start time: 12:33pm End time: 12:43  Total time spent on patient care (including telephone call/ virtual visit): 10 minutes  [place], [place] [phone]

## 2022-09-16 ENCOUNTER — Encounter (HOSPITAL_COMMUNITY): Payer: Self-pay

## 2022-09-16 ENCOUNTER — Telehealth (HOSPITAL_COMMUNITY): Payer: Medicare Other | Admitting: Psychiatry

## 2022-09-22 ENCOUNTER — Telehealth (INDEPENDENT_AMBULATORY_CARE_PROVIDER_SITE_OTHER): Payer: Medicare Other | Admitting: Psychiatry

## 2022-09-22 ENCOUNTER — Encounter (HOSPITAL_COMMUNITY): Payer: Self-pay | Admitting: Psychiatry

## 2022-09-22 DIAGNOSIS — G2401 Drug induced subacute dyskinesia: Secondary | ICD-10-CM | POA: Diagnosis not present

## 2022-09-22 DIAGNOSIS — F3341 Major depressive disorder, recurrent, in partial remission: Secondary | ICD-10-CM

## 2022-09-22 DIAGNOSIS — R9431 Abnormal electrocardiogram [ECG] [EKG]: Secondary | ICD-10-CM | POA: Diagnosis not present

## 2022-09-22 DIAGNOSIS — G4700 Insomnia, unspecified: Secondary | ICD-10-CM | POA: Diagnosis not present

## 2022-09-22 DIAGNOSIS — F09 Unspecified mental disorder due to known physiological condition: Secondary | ICD-10-CM

## 2022-09-22 DIAGNOSIS — H9193 Unspecified hearing loss, bilateral: Secondary | ICD-10-CM | POA: Diagnosis not present

## 2022-09-22 DIAGNOSIS — T43505A Adverse effect of unspecified antipsychotics and neuroleptics, initial encounter: Secondary | ICD-10-CM | POA: Diagnosis not present

## 2022-09-22 NOTE — Progress Notes (Signed)
Melissa Bowman Outpatient Progress Note  09/22/2022 11:40 AM Melissa Bowman  MRN:  [id number]  Assessment:  Melissa Bowman presents for follow-up evaluation. Today, 09/22/22, patient reports ongoing good mood as confirmed by her husband. Patient still has difficulty with responses due to diminished hearing, husband will help her coordinate seeing ENT for hearing check. This will likely help preserve her cognition along with addition of memantine; though they have been unable to pick it up yet. They did remain on abilify after last visit despite calling patient's husband to discontinue which may have accounted for ongoing Qtc prolongation. In the long term, will plan on tapering and discontinuing lexapro as remeron titration occurs. Will increase as in plan below today. She appears to be tolerating this well and insomnia has improved. Follow up in 1 month and will likely repeat ecg around that time through PCP's office.  Identifying Information: Melissa Bowman is a 71 y.o. female with a history of mild neurocognitive disorder, major depressive disorder, anxiety, insomnia, neuroleptic tardive dyskinesia, bradycardia with prolonged Qtc, and central sleep apnea  who is an established patient with [facility] participating in follow-up via video conferencing. Initial evaluation on 08/19/22, see that note for full case formulation. Amitriptyline discontinued due to possible drug interaction with her heart. Her CPAP was adjusted in March by her provider and her husband fine tunes at home. Trials of belsombra and ramelteon proved ineffective. Mirtazapine was next medication trial. Discontinued patient's abilify as her depression appears to be in remission outside of the symptoms of insomnia and some decreased appetite. She was noted to have neuroleptic induced tardive dyskinesia. Also had Qtc prolongation along with sinus bradycardia on independent review of ecg from 06/2022 which was another reason to  discontinue the abilify as outlined in plan. Had difficulty hearing vs some delay from abilify use vs early stages of a neurocognitive disorder. Increased concern for the latter due to patient's age, prolonged use of amitriptyline which has a higher anticholinergic load, and frequent checking with husband for answers to some questions during interview. MOCA done with PCPs office after that visit showing MoCA score of 24 on recent examination on 09/10/2022 and patient was started on memantine.    Plan:   # Mild neurocognitive disorder  diminished hearing Past medication trials: memantine, hearing aids Status of problem: new to provider Interventions: -- continue memantine '5mg'$  nightly x1 month then increase to bid -- patient to set up hearing evaluation with ENT  # Insomnia  Central sleep apnea Past medication trials: amitriptyline (effective), belsombra, ramelteon Status of problem: improving Interventions: -- discontinue ramelteon, belsombra -- increase mirtazapine '15mg'$  once nightly (i10/11/23) -- CMP is up to date with normal sodium   # Major depressive disorder, in partial remission Past medication trials: abilify, lexapro, amitriptyline. 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   Subjective:  Chief Complaint:  Chief Complaint  Patient presents with   Follow-up  Insomnia    Interval  History: Sleeping a lot better with remeron and has been taking half a tablet of abilify since last visit. Doesn't know if her mouth has been moving more. [name] who is also present confirms she has been doing well mood wise and provides rest of history. Foot moves most when she is sleeping or relaxing on the couch. They haven't been able to get the memantine yet as it was out of stock. Has also noticed patient is hard of hearing, has hearing aids but patient doesn't like to use them; has noted "deer in the headlights" look on her much of the time. He will have her follow up with ENT for hearing check.  Visit Diagnosis:    ICD-10-CM   1. Insomnia, unspecified type  G47.00     2. Mild cognitive disorder  F09     3. Recurrent major depressive disorder, in partial remission ([place])  F33.41     4. Prolonged Q-T interval on ECG  R94.31     5. Neuroleptic-induced tardive dyskinesia  G24.01    T43.505A     6. Hearing decreased, bilateral  H91.93       Past Psychiatric History:  Diagnoses: mild neurocognitive disorder, major depressive disorder, anxiety, insomnia, neuroleptic tardive dyskinesia Medication trials: abilify, lexapro, amitriptyline, belsombra, melatonin, remeron (currently effective). Others she cannot remember Previous psychiatrist/therapist: biofeedback many years ago Hospitalizations: none Suicide attempts: none SIB: none Hx of violence towards others: none Current access to guns: will assess at future visit Hx of abuse: none Substance use: none  Past Medical History:  Past Medical History:  Diagnosis Date   Allergic rhinitis    Allergy to ertapenem    Anxiety 08/27/2011   Anxiety and depression    Asthmatic bronchitis    Bilateral carpal tunnel syndrome    Condyloma acuminata    Diverticulosis    Eczema    GERD (gastroesophageal reflux disease)    H. pylori infection 11/16/1993   Hiatal hernia    History of benign adrenal tumor 2007   [facility]   History of pneumonia  2007, 2008   Hyperlipidemia    Hypertension    IBS (irritable bowel syndrome)    Internal hemorrhoids    Kyphosis    Long term current use of antipsychotic medication 08/19/2022   Memory loss    Menopause    Metabolic syndrome    OSA on CPAP    Type 2 diabetes mellitus ([place])    Vitamin D deficiency     Past Surgical History:  Procedure Laterality Date   abdominal wall lematoma of fabdd hys.     APPENDECTOMY     CARPAL TUNNEL RELEASE     CESAREAN SECTION     X2   COLONOSCOPY     INCISIONAL HERNIA REPAIR     right breast biopsy  09/1995   ROTATOR CUFF REPAIR  2002   right   ROTATOR CUFF REPAIR/ bone spur  08/30/2009   SHOULDER ARTHROSCOPY WITH ROTATOR CUFF REPAIR AND SUBACROMIAL DECOMPRESSION Right 01/19/2018   Procedure: REVISION RIGHT MINI OPEN ROTATOR CUFF REPAIR AND SUBACROMIAL DECOMPRESSION;  Surgeon: Susa Day, Bowman;  Location: [facility];  Service: Orthopedics;  Laterality: Right;  90 MINS   sleep apnea surgery  04/1995   torn cartiledge rt knee x2  05/13/2006   Dr. Charlotta Newton    TOTAL ABDOMINAL HYSTERECTOMY W/ BILATERAL SALPINGOOPHORECTOMY  07/1993   fibroids     Family Psychiatric History: both daughters with depression  Family History:  Family History  Problem Relation Age of Onset   Heart disease Mother    Parkinsonism Father    Emphysema Father    Hip fracture Father    Heart failure Sister    Ovarian cancer Sister    COPD Sister    Lymphoma Sister    COPD Sister    COPD Brother    Hypertension Brother    Heart disease Brother    COPD Brother    Colon cancer Neg Hx    Esophageal cancer Neg Hx    Pancreatic cancer Neg Hx    Stomach cancer Neg Hx    Breast cancer Neg Hx     Social History:  Social History   Socioeconomic History   Marital status: Married    Spouse name: [name]   Number of children: 2   Years of education: some college   Highest education level: Some college, no degree  Occupational History   Occupation: retired    Comment:  daycare  Tobacco Use   Smoking status: Never   Smokeless tobacco: Never  Vaping Use   Vaping Use: Never used  Substance and Sexual Activity   Alcohol use: No   Drug use: No   Sexual activity: Not on file  Other Topics Concern   Not on file  Social History Narrative   Skyylar is retired. She lives with her husband [name]. They have two grown daughters that live locally. Elyce enjoys reading and puzzles.    Social Determinants of Health   Financial Resource Strain: Low Risk  (04/22/2021)   Overall Financial Resource Strain (CARDIA)    Difficulty of Paying Living Expenses: Not hard at all  Food Insecurity: No Food Insecurity (04/22/2021)   Hunger Vital Sign    Worried About Running Out of Food in the Last Year: Never true    Ran Out of Food in the Last Year: Never true  Transportation Needs: No Transportation Needs (04/22/2021)   PRAPARE - Hydrologist (Medical): No    Lack of Transportation (Non-Medical): No  Physical Activity: Insufficiently Active (04/23/2022)   Exercise Vital Sign    Days of Exercise per Week: 7 days    Minutes of Exercise per Session: 20 min  Stress: No Stress Concern Present (04/23/2022)   [place]    Feeling of Stress : Only a little  Social Connections: Moderately Isolated (04/22/2021)   Social Connection and Isolation Panel [NHANES]    Frequency of Communication with Friends and Family: More than three times a week    Frequency of Social Gatherings with Friends and Family: More than three times a week    Attends Religious Services: Never    Marine scientist or Organizations: No    Attends Archivist Meetings: Never    Marital Status: Married    Allergies:  Allergies  Allergen Reactions   Statins Other (See Comments)    myalgias   E-Mycin [Erythromycin] Other (See Comments)    Medication intolerance stomach cramps   Estrogens Other (See  Comments)    Estrogen replacement Therapy intolerance mood changes   Sulfa Antibiotics Other (See Comments)    Causes severe stomach cramps    Vioxx [Rofecoxib] Other (See Comments)    Unknown reaction/interaction. Possible stomach cramps    Current Medications: Current Outpatient Medications  Medication Sig Dispense Refill   Ascorbic Acid (VITAMIN C) 1000 MG tablet Take 1,000 mg  by mouth daily.     aspirin EC 81 MG tablet Take 1 tablet (81 mg total) by mouth daily.     Calcium Carb-Cholecalciferol 600-800 MG-UNIT TABS Take 2 tablets by mouth daily.     Cholecalciferol (VITAMIN D-3) 5000 UNITS TABS Take 5,000 Units by mouth daily.     escitalopram (LEXAPRO) 20 MG tablet Take 1 tablet (20 mg total) by mouth daily. 90 tablet 0   fenofibrate 160 MG tablet TAKE 1 TABLET BY MOUTH DAILY 90 tablet 1   fluticasone (FLONASE) 50 MCG/ACT nasal spray Use 2 sprays in each  nostril daily 48 g 2   irbesartan-hydrochlorothiazide (AVALIDE) 150-12.5 MG tablet TAKE 1 TABLET BY MOUTH DAILY 90 tablet 1   memantine (NAMENDA) 5 MG tablet Take 1 tablet (5 mg total) by mouth at bedtime for 30 days, THEN 1 tablet (5 mg total) 2 (two) times daily. 180 tablet 0   montelukast (SINGULAIR) 10 MG tablet TAKE 1 TABLET BY MOUTH DAILY 100 tablet 2   omeprazole (PRILOSEC) 40 MG capsule TAKE 1 CAPSULE BY MOUTH DAILY 100 capsule 1   PROAIR HFA 108 (90 BASE) MCG/ACT inhaler Use 2 puffs into the lungs  every 6 hours as needed for wheezing or shortness of  breath 25.5 g 2   Current Facility-Administered Medications  Medication Dose Route Frequency Provider Last Rate Last Admin   0.9 %  sodium chloride infusion  500 mL Intravenous Once Daryel November, Bowman        ROS: Review of Systems  HENT:         Hard of hearing  Neurological:  Positive for tremors.  Psychiatric/Behavioral:  Negative for dysphoric mood, self-injury, sleep disturbance and suicidal ideas.     Objective:  Psychiatric Specialty Exam: There were no  vitals taken for this visit.There is no height or weight on file to calculate BMI.  General Appearance: Casual, Neat, Well Groomed, and wearing glasses. Appears stated age  Eye Contact:  Fair  Speech:   slight speech impediment. Delay in responses. Short sentence structure.  Volume:  Normal  Mood:   "alright"  Affect:  Appropriate, Congruent, and decreased range most likely due to difficulty hearing. Social smile  Thought Content: Hallucinations: None and Frequent "I don't know"    Suicidal Thoughts:  No  Homicidal Thoughts:  No  Thought Process:  Descriptions of Associations: Intact. Concrete  Orientation:  Full (Time, Place, and Person)    Memory:  Immediate;   Poor Recent;   Poor Remote;   Fair  Judgment:  Fair  Insight:  Fair  Concentration:  Concentration: Fair and Attention Span: Fair  Recall:  [employer] of Knowledge: Fair  Language: Fair  Psychomotor Activity:  TD of jaw, lips, tongue  Akathisia:  No  AIMS (if indicated): done  Assets:  Desire for Improvement Financial Resources/Insurance Housing Intimacy Leisure Time [place] Talents/Skills Transportation  ADL's:  Intact  Cognition: Impaired,  Mild  Sleep:  Good   PE: General: sits comfortably in view of camera; no acute distress  Pulm: no increased work of breathing on room air  MSK: all extremity movements appear intact  Neuro: resting tremor of jaw, lip smacking, tongue tremor on extension and at rest [place]: unable to assess by video    Metabolic Disorder Labs: Lab Results  Component Value Date   HGBA1C 5.2 09/10/2022   MPG 134.11 01/12/2018   No results found for: "PROLACTIN" Lab Results  Component Value  Date   CHOL 165 02/03/2022   TRIG 100 02/03/2022   HDL 42 02/03/2022   CHOLHDL 3.9 02/03/2022   LDLCALC 105 (H) 02/03/2022   LDLCALC 83 05/01/2021   Lab Results  Component Value Date   TSH 2.290 01/21/2021   TSH 1.820 05/27/2020    Therapeutic  Level Labs: No results found for: "LITHIUM" No results found for: "VALPROATE" No results found for: "CBMZ"  Screenings:  GAD-7    Flowsheet Row Office Visit from 09/10/2022 in [place] Visit from 08/03/2022 in [place] Visit from 06/23/2022 in [place] Visit from 02/03/2022 in [place] Visit from 08/03/2021 in [place]  Total GAD-7 Score 0 0 0 0 0      Mini-Mental    Flowsheet Row Clinical Support from 02/07/2019 in [place]  Total Score (max 30 points ) 30      PHQ2-9    [place] Visit from 09/10/2022 in [place] Video Visit from 08/19/2022 in [place] Office Visit from 08/03/2022 in [place] Visit from 06/23/2022 in [place] Visit from 02/03/2022 in [place]  PHQ-2 Total Score 0 0 0 0 0  PHQ-9 Total Score 0 6 3 -- --      Flowsheet Row Video Visit from 08/19/2022 in [place] No Risk       Collaboration of Care: Collaboration of Care: Primary Care Provider AEB coordination of care  Patient/Guardian was advised Release of Information must be obtained prior to any record release in order to collaborate their care with an outside provider. Patient/Guardian was advised if they have not already done so to contact the registration department to sign all necessary forms in order for [place] to release information regarding their care.   Consent: Patient/Guardian gives verbal consent for treatment and assignment of benefits for services provided during this visit. Patient/Guardian expressed understanding and agreed to proceed.   Televisit via video: I connected with [name] on  09/22/22 at 11:00 AM EDT by a video enabled telemedicine application and verified that I am speaking with the correct person using two identifiers.  Location: Patient: home Provider: [facility]   I discussed the limitations of evaluation and management by telemedicine and the availability of in person appointments. The patient expressed understanding and agreed to proceed.  I discussed the assessment and treatment plan with the patient. The patient was provided an opportunity to ask questions and all were answered. The patient agreed with the plan and demonstrated an understanding of the instructions.   The patient was advised to call back or seek an in-person evaluation if the symptoms worsen or if the condition fails to improve as anticipated.  I provided 25 minutes of non-face-to-face time during this encounter.  Jacquelynn Cree, Bowman 09/22/2022, 11:40 AM

## 2022-09-22 NOTE — Patient Instructions (Signed)
You can stop taking the abilify today. We increased your remeron to '15mg'$  nightly. At your next visit, we will plan on decreasing your lexapro as this should help the electrical activity of your heart, specifically the qtc interval which is slightly prolonged.

## 2022-09-24 ENCOUNTER — Other Ambulatory Visit: Payer: Self-pay | Admitting: Family Medicine

## 2022-10-04 ENCOUNTER — Other Ambulatory Visit (HOSPITAL_COMMUNITY): Payer: Self-pay | Admitting: Psychiatry

## 2022-10-04 DIAGNOSIS — G4700 Insomnia, unspecified: Secondary | ICD-10-CM

## 2022-10-04 DIAGNOSIS — F3341 Major depressive disorder, recurrent, in partial remission: Secondary | ICD-10-CM

## 2022-10-06 ENCOUNTER — Encounter (HOSPITAL_COMMUNITY): Payer: Self-pay

## 2022-10-06 DIAGNOSIS — G4700 Insomnia, unspecified: Secondary | ICD-10-CM

## 2022-10-06 DIAGNOSIS — F3341 Major depressive disorder, recurrent, in partial remission: Secondary | ICD-10-CM

## 2022-10-07 ENCOUNTER — Telehealth (HOSPITAL_COMMUNITY): Payer: Self-pay | Admitting: *Deleted

## 2022-10-07 DIAGNOSIS — F3341 Major depressive disorder, recurrent, in partial remission: Secondary | ICD-10-CM

## 2022-10-07 DIAGNOSIS — G4700 Insomnia, unspecified: Secondary | ICD-10-CM

## 2022-10-07 MED ORDER — MIRTAZAPINE 15 MG PO TABS: 15.0000 mg | ORAL_TABLET | Freq: Every day | ORAL | 1 refills | Status: DC

## 2022-10-07 MED ORDER — MIRTAZAPINE 15 MG PO TABS: 15.0000 mg | ORAL_TABLET | Freq: Every day | ORAL | 0 refills | Status: DC

## 2022-10-07 NOTE — Addendum Note (Signed)
Addended by: Debby Bud on: 10/07/2022 02:59 PM   Modules accepted: Orders

## 2022-10-07 NOTE — Telephone Encounter (Addendum)
Patient husband called stating the mail order will not get to them in time and patient is out of her Remeron. Per pt husband if provider could please send in at least 10 pills to the pharmacy. Per husband they will pay out of pocket. [facility]   10 day supply called in to [place].

## 2022-10-20 DIAGNOSIS — T1511XA Foreign body in conjunctival sac, right eye, initial encounter: Secondary | ICD-10-CM | POA: Diagnosis not present

## 2022-10-20 DIAGNOSIS — H25813 Combined forms of age-related cataract, bilateral: Secondary | ICD-10-CM | POA: Diagnosis not present

## 2022-10-20 DIAGNOSIS — H02102 Unspecified ectropion of right lower eyelid: Secondary | ICD-10-CM | POA: Diagnosis not present

## 2022-10-20 DIAGNOSIS — H04123 Dry eye syndrome of bilateral lacrimal glands: Secondary | ICD-10-CM | POA: Diagnosis not present

## 2022-10-22 ENCOUNTER — Other Ambulatory Visit: Payer: Self-pay | Admitting: Family Medicine

## 2022-10-22 ENCOUNTER — Telehealth (INDEPENDENT_AMBULATORY_CARE_PROVIDER_SITE_OTHER): Payer: Medicare Other | Admitting: Psychiatry

## 2022-10-22 ENCOUNTER — Encounter (HOSPITAL_COMMUNITY): Payer: Self-pay | Admitting: Psychiatry

## 2022-10-22 DIAGNOSIS — R9431 Abnormal electrocardiogram [ECG] [EKG]: Secondary | ICD-10-CM | POA: Diagnosis not present

## 2022-10-22 DIAGNOSIS — F3341 Major depressive disorder, recurrent, in partial remission: Secondary | ICD-10-CM | POA: Diagnosis not present

## 2022-10-22 DIAGNOSIS — H9193 Unspecified hearing loss, bilateral: Secondary | ICD-10-CM

## 2022-10-22 DIAGNOSIS — G4700 Insomnia, unspecified: Secondary | ICD-10-CM

## 2022-10-22 DIAGNOSIS — G2401 Drug induced subacute dyskinesia: Secondary | ICD-10-CM

## 2022-10-22 DIAGNOSIS — T43505A Adverse effect of unspecified antipsychotics and neuroleptics, initial encounter: Secondary | ICD-10-CM

## 2022-10-22 DIAGNOSIS — F09 Unspecified mental disorder due to known physiological condition: Secondary | ICD-10-CM

## 2022-10-22 MED ORDER — ESCITALOPRAM OXALATE 10 MG PO TABS: 10.0000 mg | ORAL_TABLET | Freq: Every day | ORAL | 0 refills | Status: DC

## 2022-10-22 MED ORDER — MIRTAZAPINE 30 MG PO TABS: 30.0000 mg | ORAL_TABLET | Freq: Every day | ORAL | 0 refills | Status: DC

## 2022-10-22 NOTE — Progress Notes (Signed)
[name] MD Outpatient Progress Note  10/22/2022 11:37 AM Melissa Bowman  MRN:  [id number]  Assessment:  Melissa Bowman presents for follow-up evaluation. Today, 10/22/22, patient reports ongoing good mood as confirmed by her husband. Patient still has difficulty with responses due to diminished hearing, husband will help her coordinate seeing ENT for hearing check which should happen by the end of this month. This will likely help preserve her cognition along with addition of memantine; though it remains unclear if they have actually started it. Will decrease lexapro today in ongoing effort to normalize Qtc prolongation. In the long term, will plan on tapering and discontinuing lexapro as remeron titration occurs as outlined in plan. She appears to be tolerating this well and insomnia has improved. Follow up in 1 month and hopefully will be able to obtain repeat ecg through PCP's office; will also see if they can initiate neurology referral to work up TD.  Identifying Information: Melissa Bowman is a 71 y.o. female with a history of mild neurocognitive disorder, major depressive disorder, anxiety, insomnia, neuroleptic tardive dyskinesia, bradycardia with prolonged Qtc, and central sleep apnea  who is an established patient with [facility] participating in follow-up via video conferencing. Initial evaluation on 08/19/22, see that note for full case formulation. Amitriptyline discontinued due to possible drug interaction with her heart. Her CPAP was adjusted in March by her provider and her husband fine tunes at home. Trials of belsombra and ramelteon proved ineffective. Mirtazapine was next medication trial. Discontinued patient's abilify as her depression appears to be in remission outside of the symptoms of insomnia and some decreased appetite. She was noted to have neuroleptic induced tardive dyskinesia. Also had Qtc prolongation along with sinus bradycardia on independent review of  ecg from 06/2022 which was another reason to discontinue the abilify as outlined in plan. Had difficulty hearing vs some delay from abilify use vs early stages of a neurocognitive disorder. Increased concern for the latter due to patient's age, prolonged use of amitriptyline which has a higher anticholinergic load, and frequent checking with husband for answers to some questions during interview. MOCA done with PCPs office after that visit showing MoCA score of 24 on recent examination on 09/10/2022 and patient was started on memantine.    Plan:   # Mild neurocognitive disorder  diminished hearing Past medication trials: memantine, hearing aids Status of problem: chronic and stable Interventions: -- continue memantine '5mg'$  nightly x1 month then increase to bid -- patient to set up hearing evaluation with ENT  # Insomnia  Central sleep apnea Past medication trials: amitriptyline (effective), belsombra, ramelteon Status of problem: improving Interventions: -- increase mirtazapine '30mg'$  once nightly (i10/11/23, i11/10/23) -- CMP is up to date with normal sodium   # Major depressive disorder, in partial remission Past medication trials: abilify, lexapro, amitriptyline. 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   Subjective:  Chief Complaint:  Chief Complaint  Patient presents with    Depression   Dementia   Medication Reaction    Interval History: Doing good this morning, Melissa Bowman does hearing for patient. Sleeping well and medication working. Not having any depression. Still having the extra movements in her mouth and Melissa Bowman notes extra movements in her feet still. Will be able to get in to see hearing aid doctor before the month ends. Memory seems to be holding steady with regard to immediate events though they aren't sure if they have been taking memantine.   Sleeping a lot better with remeron and has been taking half a tablet of abilify since last visit. Doesn't know if her mouth has been moving more. Melissa Bowman who is also present confirms she has been doing well mood wise and provides rest of history. Foot moves most when she is sleeping or relaxing on the couch. They haven't been able to get the memantine yet as it was out of stock. Has also noticed patient is hard of hearing, has hearing aids but patient doesn't like to use them; has noted "deer in the headlights" look on her much of the time. He will have her follow up with ENT for hearing check.  Visit Diagnosis:    ICD-10-CM   1. Mild cognitive disorder  F09     2. Recurrent major depressive disorder, in partial remission (HCC)  F33.41 escitalopram (LEXAPRO) 10 MG tablet    mirtazapine (REMERON) 30 MG tablet    3. Insomnia, unspecified type  G47.00 mirtazapine (REMERON) 30 MG tablet    4. Neuroleptic-induced tardive dyskinesia  G24.01    T43.505A     5. Prolonged Q-T interval on ECG  R94.31     6. Hearing decreased, bilateral  H91.93       Past Psychiatric History:  Diagnoses: mild neurocognitive disorder, major depressive disorder, anxiety, insomnia, neuroleptic tardive dyskinesia Medication trials: abilify, lexapro, amitriptyline, belsombra, melatonin, remeron (currently effective). Others she cannot remember Previous psychiatrist/therapist: biofeedback many years ago Hospitalizations: none Suicide  attempts: none SIB: none Hx of violence towards others: none Current access to guns: will assess at future visit Hx of abuse: none Substance use: none  Past Medical History:  Past Medical History:  Diagnosis Date   Allergic rhinitis    Allergy to ertapenem    Anxiety 08/27/2011   Anxiety and depression    Asthmatic bronchitis    Bilateral carpal tunnel syndrome    Condyloma acuminata    Diverticulosis    Eczema    GERD (gastroesophageal reflux disease)    H. pylori infection 11/16/1993   Hiatal hernia    History of benign adrenal tumor 2007   [facility]   History of pneumonia 2007, 2008   Hyperlipidemia    Hypertension    IBS (irritable bowel syndrome)    Internal hemorrhoids    Kyphosis    Long term current use of antipsychotic medication 08/19/2022   Memory loss    Menopause    Metabolic syndrome    OSA on CPAP    Type 2 diabetes mellitus ([place])    Vitamin D deficiency     Past Surgical History:  Procedure Laterality Date   abdominal wall lematoma of fabdd hys.     APPENDECTOMY     CARPAL TUNNEL RELEASE     CESAREAN SECTION     X2   COLONOSCOPY     INCISIONAL HERNIA REPAIR     right breast biopsy  09/1995   ROTATOR CUFF REPAIR  2002  right   ROTATOR CUFF REPAIR/ bone spur  08/30/2009   SHOULDER ARTHROSCOPY WITH ROTATOR CUFF REPAIR AND SUBACROMIAL DECOMPRESSION Right 01/19/2018   Procedure: REVISION RIGHT MINI OPEN ROTATOR CUFF REPAIR AND SUBACROMIAL DECOMPRESSION;  Surgeon: Susa Day, MD;  Location: [facility];  Service: Orthopedics;  Laterality: Right;  17 MINS   sleep apnea surgery  04/1995   torn cartiledge rt knee x2  05/13/2006   Dr. Ronnie Derby Smoc    TOTAL ABDOMINAL HYSTERECTOMY W/ BILATERAL SALPINGOOPHORECTOMY  07/1993   fibroids     Family Psychiatric History: both daughters with depression  Family History:  Family History  Problem Relation Age of Onset   Heart disease Mother    Parkinsonism Father    Emphysema Father    Hip fracture Father    Heart  failure Sister    Ovarian cancer Sister    COPD Sister    Lymphoma Sister    COPD Sister    COPD Brother    Hypertension Brother    Heart disease Brother    COPD Brother    Colon cancer Neg Hx    Esophageal cancer Neg Hx    Pancreatic cancer Neg Hx    Stomach cancer Neg Hx    Breast cancer Neg Hx     Social History:  Social History   Socioeconomic History   Marital status: Married    Spouse name: Melissa Bowman   Number of children: 2   Years of education: some college   Highest education level: Some college, no degree  Occupational History   Occupation: retired    Comment: daycare  Tobacco Use   Smoking status: Never   Smokeless tobacco: Never  Vaping Use   Vaping Use: Never used  Substance and Sexual Activity   Alcohol use: No   Drug use: No   Sexual activity: Not on file  Other Topics Concern   Not on file  Social History Narrative   Bitania is retired. She lives with her husband Melissa Bowman. They have two grown daughters that live locally. Aiana enjoys reading and puzzles.    Social Determinants of Health   Financial Resource Strain: Low Risk  (04/22/2021)   Overall Financial Resource Strain (CARDIA)    Difficulty of Paying Living Expenses: Not hard at all  Food Insecurity: No Food Insecurity (04/22/2021)   Hunger Vital Sign    Worried About Running Out of Food in the Last Year: Never true    Ran Out of Food in the Last Year: Never true  Transportation Needs: No Transportation Needs (04/22/2021)   PRAPARE - Hydrologist (Medical): No    Lack of Transportation (Non-Medical): No  Physical Activity: Insufficiently Active (04/23/2022)   Exercise Vital Sign    Days of Exercise per Week: 7 days    Minutes of Exercise per Session: 20 min  Stress: No Stress Concern Present (04/23/2022)   [place]    Feeling of Stress : Only a little  Social Connections: Moderately Isolated  (04/22/2021)   Social Connection and Isolation Panel [NHANES]    Frequency of Communication with Friends and Family: More than three times a week    Frequency of Social Gatherings with Friends and Family: More than three times a week    Attends Religious Services: Never    Marine scientist or Organizations: No    Attends Archivist Meetings: Never    Marital Status: Married  Allergies:  Allergies  Allergen Reactions   Statins Other (See Comments)    myalgias   E-Mycin [Erythromycin] Other (See Comments)    Medication intolerance stomach cramps   Estrogens Other (See Comments)    Estrogen replacement Therapy intolerance mood changes   Sulfa Antibiotics Other (See Comments)    Causes severe stomach cramps    Vioxx [Rofecoxib] Other (See Comments)    Unknown reaction/interaction. Possible stomach cramps    Current Medications: Current Outpatient Medications  Medication Sig Dispense Refill   escitalopram (LEXAPRO) 10 MG tablet Take 1 tablet (10 mg total) by mouth daily. 30 tablet 0   Ascorbic Acid (VITAMIN C) 1000 MG tablet Take 1,000 mg by mouth daily.     aspirin EC 81 MG tablet Take 1 tablet (81 mg total) by mouth daily.     Calcium Carb-Cholecalciferol 600-800 MG-UNIT TABS Take 2 tablets by mouth daily.     Cholecalciferol (VITAMIN D-3) 5000 UNITS TABS Take 5,000 Units by mouth daily.     fenofibrate 160 MG tablet TAKE 1 TABLET BY MOUTH DAILY 90 tablet 1   fluticasone (FLONASE) 50 MCG/ACT nasal spray Use 2 sprays in each  nostril daily 48 g 2   irbesartan-hydrochlorothiazide (AVALIDE) 150-12.5 MG tablet TAKE 1 TABLET BY MOUTH DAILY 100 tablet 1   memantine (NAMENDA) 5 MG tablet Take 1 tablet (5 mg total) by mouth at bedtime for 30 days, THEN 1 tablet (5 mg total) 2 (two) times daily. 180 tablet 0   mirtazapine (REMERON) 30 MG tablet Take 1 tablet (30 mg total) by mouth at bedtime. 30 tablet 0   montelukast (SINGULAIR) 10 MG tablet TAKE 1 TABLET BY MOUTH DAILY  100 tablet 2   omeprazole (PRILOSEC) 40 MG capsule TAKE 1 CAPSULE BY MOUTH DAILY 100 capsule 1   PROAIR HFA 108 (90 BASE) MCG/ACT inhaler Use 2 puffs into the lungs  every 6 hours as needed for wheezing or shortness of  breath 25.5 g 2   Current Facility-Administered Medications  Medication Dose Route Frequency Provider Last Rate Last Admin   0.9 %  sodium chloride infusion  500 mL Intravenous Once Daryel November, MD        ROS: Review of Systems  HENT:         Hard of hearing  Neurological:  Positive for tremors.  Psychiatric/Behavioral:  Negative for dysphoric mood, self-injury, sleep disturbance and suicidal ideas.     Objective:  Psychiatric Specialty Exam: There were no vitals taken for this visit.There is no height or weight on file to calculate BMI.  General Appearance: Casual, Neat, Well Groomed, and wearing glasses. Appears stated age  Eye Contact:  Fair  Speech:   slight speech impediment. Delay in responses. Short sentence structure.  Volume:  Normal  Mood:   "good"  Affect:  Appropriate, Congruent, and decreased range most likely due to difficulty hearing. Social smile  Thought Content: Hallucinations: None and Frequent "I don't know"    Suicidal Thoughts:  No  Homicidal Thoughts:  No  Thought Process:  Descriptions of Associations: Intact. Concrete  Orientation:  Full (Time, Place, and Person)    Memory:  Immediate;   Poor Recent;   Poor Remote;   Fair  Judgment:  Fair  Insight:  Fair  Concentration:  Concentration: Fair and Attention Span: Fair  Recall:  [employer] of Knowledge: Fair  Language: Fair  Psychomotor Activity:  TD of jaw, lips, tongue, feet  Akathisia:  No  AIMS (if  indicated): done  Assets:  Desire for Improvement Financial Resources/Insurance Housing Intimacy Leisure Time Physical Health Resilience Social Support Talents/Skills Transportation  ADL's:  Intact  Cognition: Impaired,  Mild  Sleep:  Good   PE: General: sits  comfortably in view of camera; no acute distress  Pulm: no increased work of breathing on room air  MSK: all extremity movements appear intact  Neuro: resting tremor of jaw, lip smacking, tongue tremor on extension and at rest, in feet as well Gait & Station: unable to assess by video    Metabolic Disorder Labs: Lab Results  Component Value Date   HGBA1C 5.2 09/10/2022   MPG 134.11 01/12/2018   No results found for: "PROLACTIN" Lab Results  Component Value Date   CHOL 165 02/03/2022   TRIG 100 02/03/2022   HDL 42 02/03/2022   CHOLHDL 3.9 02/03/2022   LDLCALC 105 (H) 02/03/2022   LDLCALC 83 05/01/2021   Lab Results  Component Value Date   TSH 2.290 01/21/2021   TSH 1.820 05/27/2020    Therapeutic Level Labs: No results found for: "LITHIUM" No results found for: "VALPROATE" No results found for: "CBMZ"  Screenings:  GAD-7    Flowsheet Row Office Visit from 09/10/2022 in [place] Visit from 08/03/2022 in [facility] Office Visit from 06/23/2022 in [place] Visit from 02/03/2022 in [facility] Visit from 08/03/2021 in [place]  Total GAD-7 Score 0 0 0 0 0      Mini-Mental    Flowsheet Row Clinical Support from 02/07/2019 in [place]  Total Score (max 30 points ) 30      PHQ2-9    [place] Visit from 09/10/2022 in [place] Video Visit from 08/19/2022 in [place] Office Visit from 08/03/2022 in [place] Visit from 06/23/2022 in [place] Visit from 02/03/2022 in [place]  PHQ-2 Total Score 0 0 0 0 0  PHQ-9 Total Score 0 6 3 -- --      Flowsheet Row Video Visit from 08/19/2022 in [facility]  C-SSRS  RISK CATEGORY No Risk       Collaboration of Care: Collaboration of Care: Primary Care Provider AEB coordination of care  Patient/Guardian was advised Release of Information must be obtained prior to any record release in order to collaborate their care with an outside provider. Patient/Guardian was advised if they have not already done so to contact the registration department to sign all necessary forms in order for [place] to release information regarding their care.   Consent: Patient/Guardian gives verbal consent for treatment and assignment of benefits for services provided during this visit. Patient/Guardian expressed understanding and agreed to proceed.   Televisit via video: I connected with Melissa Bowman on 10/22/22 at 11:00 AM EST by a video enabled telemedicine application and verified that I am speaking with the correct person using two identifiers.  Location: Patient: home Provider: [facility]   I discussed the limitations of evaluation and management by telemedicine and the availability of in person appointments. The patient expressed understanding and agreed to proceed.  I discussed the assessment and treatment plan with the patient. The patient was provided an opportunity to ask questions and all were answered. The patient agreed with the plan and demonstrated an understanding of the instructions.   The patient was advised to  call back or seek an in-person evaluation if the symptoms worsen or if the condition fails to improve as anticipated.  I provided 15 minutes of non-face-to-face time during this encounter.  Jacquelynn Cree, MD 10/22/2022, 11:37 AM

## 2022-10-22 NOTE — Patient Instructions (Signed)
We decreased your lexapro to '10mg'$  once daily. We also increased your remeron (mirtazapine) to '30mg'$  nightly. I will message your PCP to see if we can get an updated ecg as well as a neurology referral.

## 2022-10-27 ENCOUNTER — Telehealth (HOSPITAL_COMMUNITY): Payer: Medicare Other | Admitting: Psychiatry

## 2022-10-27 ENCOUNTER — Other Ambulatory Visit (HOSPITAL_COMMUNITY): Payer: Self-pay | Admitting: Psychiatry

## 2022-10-27 DIAGNOSIS — G4700 Insomnia, unspecified: Secondary | ICD-10-CM

## 2022-10-27 DIAGNOSIS — F3341 Major depressive disorder, recurrent, in partial remission: Secondary | ICD-10-CM

## 2022-11-12 ENCOUNTER — Other Ambulatory Visit: Payer: Self-pay | Admitting: Family Medicine

## 2022-11-12 DIAGNOSIS — E119 Type 2 diabetes mellitus without complications: Secondary | ICD-10-CM

## 2022-11-12 DIAGNOSIS — E1169 Type 2 diabetes mellitus with other specified complication: Secondary | ICD-10-CM

## 2022-11-12 DIAGNOSIS — Z20822 Contact with and (suspected) exposure to covid-19: Secondary | ICD-10-CM

## 2022-11-15 ENCOUNTER — Other Ambulatory Visit: Payer: Medicare Other

## 2022-11-15 DIAGNOSIS — E119 Type 2 diabetes mellitus without complications: Secondary | ICD-10-CM

## 2022-11-15 DIAGNOSIS — G4733 Obstructive sleep apnea (adult) (pediatric): Secondary | ICD-10-CM | POA: Diagnosis not present

## 2022-11-16 LAB — MICROALBUMIN / CREATININE URINE RATIO
Creatinine, Urine: 85.7 mg/dL
Microalb/Creat Ratio: 8 mg/g creat (ref 0–29)
Microalbumin, Urine: 6.5 ug/mL

## 2022-11-22 ENCOUNTER — Other Ambulatory Visit (HOSPITAL_COMMUNITY): Payer: Self-pay | Admitting: Psychiatry

## 2022-11-22 DIAGNOSIS — G4700 Insomnia, unspecified: Secondary | ICD-10-CM

## 2022-11-22 DIAGNOSIS — F3341 Major depressive disorder, recurrent, in partial remission: Secondary | ICD-10-CM

## 2022-11-26 ENCOUNTER — Telehealth (INDEPENDENT_AMBULATORY_CARE_PROVIDER_SITE_OTHER): Payer: Medicare Other | Admitting: Psychiatry

## 2022-11-26 ENCOUNTER — Other Ambulatory Visit: Payer: Self-pay | Admitting: Family Medicine

## 2022-11-26 ENCOUNTER — Encounter (HOSPITAL_COMMUNITY): Payer: Self-pay | Admitting: Psychiatry

## 2022-11-26 DIAGNOSIS — G4700 Insomnia, unspecified: Secondary | ICD-10-CM

## 2022-11-26 DIAGNOSIS — G2401 Drug induced subacute dyskinesia: Secondary | ICD-10-CM | POA: Diagnosis not present

## 2022-11-26 DIAGNOSIS — R9431 Abnormal electrocardiogram [ECG] [EKG]: Secondary | ICD-10-CM | POA: Diagnosis not present

## 2022-11-26 DIAGNOSIS — T43505A Adverse effect of unspecified antipsychotics and neuroleptics, initial encounter: Secondary | ICD-10-CM

## 2022-11-26 DIAGNOSIS — F09 Unspecified mental disorder due to known physiological condition: Secondary | ICD-10-CM | POA: Diagnosis not present

## 2022-11-26 DIAGNOSIS — F3341 Major depressive disorder, recurrent, in partial remission: Secondary | ICD-10-CM | POA: Diagnosis not present

## 2022-11-26 DIAGNOSIS — H9193 Unspecified hearing loss, bilateral: Secondary | ICD-10-CM

## 2022-11-26 MED ORDER — ESCITALOPRAM OXALATE 5 MG PO TABS: 5.0000 mg | ORAL_TABLET | Freq: Every day | ORAL | 0 refills | Status: DC

## 2022-11-26 NOTE — Progress Notes (Signed)
[facility] MD Outpatient Progress Note  11/26/2022 11:19 AM Melissa Bowman  MRN:  [id number]  Assessment:  Melissa Bowman presents for follow-up evaluation. Today, 11/26/22, patient reports ongoing good mood as confirmed by her husband. Patient still has difficulty with responses due to diminished hearing, husband will help her coordinate seeing ENT for hearing check which should happen in the new year. This will likely help preserve her cognition along with addition of memantine; though they have not picked up this medication at this time have recommended that they coordinate with PCP to obtain this. Will decrease lexapro today in ongoing effort to normalize Qtc prolongation and ask PCP to recheck ECG.  They just started the 10 mg dose and so when the pickup the next dose will moved to 5 mg.  In the long term, will plan on stopping Lexapro completely as she does appear to be responding well depression and sleep wise to the Remeron. Follow up in 1 month and will also see if PCP can initiate neurology referral to work up TD (which appears to be improving slightly).  Identifying Information: Melissa Bowman is a 71 y.o. Melissa with a history of mild neurocognitive disorder, major depressive disorder, anxiety, insomnia, neuroleptic tardive dyskinesia, bradycardia with prolonged Qtc, and central sleep apnea  who is an established patient with [facility] participating in follow-up via video conferencing. Initial evaluation on 08/19/22, see that note for full case formulation. Amitriptyline discontinued due to possible drug interaction with her heart. Her CPAP was adjusted in March by her provider and her husband fine tunes at home. Trials of belsombra and ramelteon proved ineffective. Mirtazapine was next medication trial. Discontinued patient's abilify as her depression appears to be in remission outside of the symptoms of insomnia and some decreased appetite. She was noted to have neuroleptic  induced tardive dyskinesia. Also had Qtc prolongation along with sinus bradycardia on independent review of ecg from 06/2022 which was another reason to discontinue the abilify as outlined in plan. Had difficulty hearing vs some delay from abilify use vs early stages of a neurocognitive disorder. Increased concern for the latter due to patient's age, prolonged use of amitriptyline which has a higher anticholinergic load, and frequent checking with husband for answers to some questions during interview. MOCA done with PCPs office after that visit showing MoCA score of 24 on recent examination on 09/10/2022 and patient was started on memantine.    Plan:   # Mild neurocognitive disorder  diminished hearing Past medication trials: memantine, hearing aids Status of problem: chronic and stable Interventions: -- continue memantine '5mg'$  nightly x1 month then increase to bid -- patient to set up hearing evaluation with ENT  # Insomnia  Central sleep apnea Past medication trials: amitriptyline (effective), belsombra, ramelteon Status of problem: improving Interventions: -- Continue mirtazapine '30mg'$  once nightly (i10/11/23, i11/10/23) -- CMP is up to date with normal sodium   # Major depressive disorder, in partial remission Past medication trials: abilify, lexapro, amitriptyline. 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   Subjective:  Chief Complaint:  Chief Complaint  Patient presents with   Depression   Memory Loss   Follow-up    Interval History: Melissa Bowman doing well today. Had a few moments since last visit with some irritability but were short lived. Tolerating new dose of mirtazapine well and sleeping well. Holding up well (staying awake) more during the day. Still hasn't been able to start memantine because they haven't picked it up yet. Despite this, patient asking more questions about going's on in her life. Still having the extra movements in her mouth but not as much as previous and Melissa Bowman notes extra movements in her feet still. They aren't bothersome to her. Haven't seen hearing aid doctor yet, are changing insurance so will pursue in [place].   Visit Diagnosis:    ICD-10-CM   1. Recurrent major depressive disorder, in partial remission (HCC)  F33.41 escitalopram (LEXAPRO) 5 MG tablet    2. Mild cognitive disorder  F09     3. Neuroleptic-induced tardive dyskinesia  G24.01    T43.505A     4. Prolonged Q-T interval on ECG  R94.31     5. Insomnia, unspecified type  G47.00     6. Hearing decreased, bilateral  H91.93       Past Psychiatric History:  Diagnoses: mild neurocognitive disorder, major depressive disorder, anxiety, insomnia, neuroleptic tardive dyskinesia Medication trials: abilify, lexapro, amitriptyline, belsombra, melatonin, remeron (currently effective). Others she cannot remember Previous psychiatrist/therapist: biofeedback many years ago Hospitalizations: none Suicide attempts: none SIB: none Hx of violence towards others: none Current access to guns: will assess at future visit Hx of abuse: none Substance use: none  Past Medical History:  Past Medical History:  Diagnosis Date   Allergic rhinitis    Allergy to ertapenem    Anxiety 08/27/2011   Anxiety and depression    Asthmatic bronchitis    Bilateral carpal tunnel syndrome     Condyloma acuminata    Diverticulosis    Eczema    GERD (gastroesophageal reflux disease)    H. pylori infection 11/16/1993   Hiatal hernia    History of benign adrenal tumor 2007   [facility]   History of pneumonia 2007, 2008   Hyperlipidemia    Hypertension    IBS (irritable bowel syndrome)    Internal hemorrhoids    Kyphosis    Long term current use of antipsychotic medication 08/19/2022   Memory loss    Menopause    Metabolic syndrome    OSA on CPAP    Type 2 diabetes mellitus ([place])    Vitamin D deficiency     Past Surgical History:  Procedure Laterality Date   abdominal wall lematoma of fabdd hys.     APPENDECTOMY     CARPAL TUNNEL RELEASE     CESAREAN SECTION     X2   COLONOSCOPY     INCISIONAL HERNIA REPAIR     right breast biopsy  09/1995   ROTATOR CUFF REPAIR  2002   right   ROTATOR CUFF REPAIR/ bone spur  08/30/2009   SHOULDER ARTHROSCOPY WITH ROTATOR CUFF REPAIR AND SUBACROMIAL DECOMPRESSION Right 01/19/2018   Procedure: REVISION RIGHT MINI OPEN ROTATOR CUFF REPAIR AND SUBACROMIAL DECOMPRESSION;  Surgeon: Susa Day, MD;  Location: [facility];  Service: Orthopedics;  Laterality: Right;  90 MINS   sleep apnea surgery  04/1995   torn cartiledge rt knee  x2  05/13/2006   Dr. Ronnie Derby Smoc    TOTAL ABDOMINAL HYSTERECTOMY W/ BILATERAL SALPINGOOPHORECTOMY  07/1993   fibroids     Family Psychiatric History: both daughters with depression  Family History:  Family History  Problem Relation Age of Onset   Heart disease Mother    Parkinsonism Father    Emphysema Father    Hip fracture Father    Heart failure Sister    Ovarian cancer Sister    COPD Sister    Lymphoma Sister    COPD Sister    COPD Brother    Hypertension Brother    Heart disease Brother    COPD Brother    Colon cancer Neg Hx    Esophageal cancer Neg Hx    Pancreatic cancer Neg Hx    Stomach cancer Neg Hx    Breast cancer Neg Hx     Social History:  Social History   Socioeconomic History    Marital status: Married    Spouse name: Melissa Bowman   Number of children: 2   Years of education: some college   Highest education level: Some college, no degree  Occupational History   Occupation: retired    Comment: daycare  Tobacco Use   Smoking status: Never   Smokeless tobacco: Never  Vaping Use   Vaping Use: Never used  Substance and Sexual Activity   Alcohol use: No   Drug use: No   Sexual activity: Not on file  Other Topics Concern   Not on file  Social History Narrative   Jamey is retired. She lives with her husband Melissa Bowman. They have two grown daughters that live locally. Leyna enjoys reading and puzzles.    Social Determinants of Health   Financial Resource Strain: Low Risk  (04/22/2021)   Overall Financial Resource Strain (CARDIA)    Difficulty of Paying Living Expenses: Not hard at all  Food Insecurity: No Food Insecurity (04/22/2021)   Hunger Vital Sign    Worried About Running Out of Food in the Last Year: Never true    Ran Out of Food in the Last Year: Never true  Transportation Needs: No Transportation Needs (04/22/2021)   PRAPARE - Hydrologist (Medical): No    Lack of Transportation (Non-Medical): No  Physical Activity: Insufficiently Active (04/23/2022)   Exercise Vital Sign    Days of Exercise per Week: 7 days    Minutes of Exercise per Session: 20 min  Stress: No Stress Concern Present (04/23/2022)   [place]    Feeling of Stress : Only a little  Social Connections: Moderately Isolated (04/22/2021)   Social Connection and Isolation Panel [NHANES]    Frequency of Communication with Friends and Family: More than three times a week    Frequency of Social Gatherings with Friends and Family: More than three times a week    Attends Religious Services: Never    Marine scientist or Organizations: No    Attends Archivist Meetings: Never    Marital  Status: Married    Allergies:  Allergies  Allergen Reactions   Statins Other (See Comments)    myalgias   E-Mycin [Erythromycin] Other (See Comments)    Medication intolerance stomach cramps   Estrogens Other (See Comments)    Estrogen replacement Therapy intolerance mood changes   Sulfa Antibiotics Other (See Comments)    Causes severe stomach cramps    Vioxx [Rofecoxib] Other (See  Comments)    Unknown reaction/interaction. Possible stomach cramps    Current Medications: Current Outpatient Medications  Medication Sig Dispense Refill   Ascorbic Acid (VITAMIN C) 1000 MG tablet Take 1,000 mg by mouth daily.     aspirin EC 81 MG tablet Take 1 tablet (81 mg total) by mouth daily.     Calcium Carb-Cholecalciferol 600-800 MG-UNIT TABS Take 2 tablets by mouth daily.     Cholecalciferol (VITAMIN D-3) 5000 UNITS TABS Take 5,000 Units by mouth daily.     escitalopram (LEXAPRO) 5 MG tablet Take 1 tablet (5 mg total) by mouth daily. 30 tablet 0   fenofibrate 160 MG tablet TAKE 1 TABLET BY MOUTH DAILY 90 tablet 1   fluticasone (FLONASE) 50 MCG/ACT nasal spray Use 2 sprays in each  nostril daily 48 g 2   irbesartan-hydrochlorothiazide (AVALIDE) 150-12.5 MG tablet TAKE 1 TABLET BY MOUTH DAILY 100 tablet 1   memantine (NAMENDA) 5 MG tablet Take 1 tablet (5 mg total) by mouth at bedtime for 30 days, THEN 1 tablet (5 mg total) 2 (two) times daily. (Patient not taking: Reported on 11/26/2022) 180 tablet 0   mirtazapine (REMERON) 30 MG tablet Take 1 tablet (30 mg total) by mouth at bedtime. 90 tablet 0   montelukast (SINGULAIR) 10 MG tablet TAKE 1 TABLET BY MOUTH DAILY 100 tablet 2   omeprazole (PRILOSEC) 40 MG capsule TAKE 1 CAPSULE BY MOUTH DAILY 100 capsule 1   PROAIR HFA 108 (90 BASE) MCG/ACT inhaler Use 2 puffs into the lungs  every 6 hours as needed for wheezing or shortness of  breath 25.5 g 2   Current Facility-Administered Medications  Medication Dose Route Frequency Provider Last Rate Last  Admin   0.9 %  sodium chloride infusion  500 mL Intravenous Once Daryel November, MD        ROS: Review of Systems  HENT:         Hard of hearing  Neurological:  Positive for tremors.  Psychiatric/Behavioral:  Negative for dysphoric mood, self-injury, sleep disturbance and suicidal ideas.     Objective:  Psychiatric Specialty Exam: There were no vitals taken for this visit.There is no height or weight on file to calculate BMI.  General Appearance: Casual, Neat, Well Groomed, and wearing glasses. Appears stated age  Eye Contact:  Fair  Speech:   slight speech impediment. Delay in responses. Short sentence structure.  Volume:  Normal  Mood:   "Doing good"  Affect:  Appropriate, Congruent, and decreased range most likely due to difficulty hearing. Social smile  Thought Content: Hallucinations: None and better able to participate in interview than previously    Suicidal Thoughts:  No  Homicidal Thoughts:  No  Thought Process:  Descriptions of Associations: Intact. Concrete  Orientation:  Full (Time, Place, and Person)    Memory:  Immediate;   Poor Recent;   Poor Remote;   Fair  Judgment:  Fair  Insight:  Fair  Concentration:  Concentration: Fair and Attention Span: Fair  Recall:  [employer] of Knowledge: Fair  Language: Fair  Psychomotor Activity:  TD of jaw, lips, tongue, feet  Akathisia:  No  AIMS (if indicated): done  Assets:  Desire for Improvement Financial Resources/Insurance Housing Intimacy Leisure Time Physical Health Resilience Social Support Talents/Skills Transportation  ADL's:  Intact  Cognition: Impaired,  Mild  Sleep:  Good   PE: General: sits comfortably in view of camera; no acute distress  Pulm: no increased work of breathing on  room air  MSK: all extremity movements appear intact  Neuro: resting tremor of jaw, lip smacking, tongue tremor on extension and at rest, in feet as well Gait & Station: unable to assess by video    Metabolic  Disorder Labs: Lab Results  Component Value Date   HGBA1C 5.2 09/10/2022   MPG 134.11 01/12/2018   No results found for: "PROLACTIN" Lab Results  Component Value Date   CHOL 165 02/03/2022   TRIG 100 02/03/2022   HDL 42 02/03/2022   CHOLHDL 3.9 02/03/2022   LDLCALC 105 (H) 02/03/2022   LDLCALC 83 05/01/2021   Lab Results  Component Value Date   TSH 2.290 01/21/2021   TSH 1.820 05/27/2020    Therapeutic Level Labs: No results found for: "LITHIUM" No results found for: "VALPROATE" No results found for: "CBMZ"  Screenings:  GAD-7    Flowsheet Row Office Visit from 09/10/2022 in [facility] Visit from 08/03/2022 in [facility] Office Visit from 06/23/2022 in [facility] Visit from 02/03/2022 in [facility] Visit from 08/03/2021 in [facility]  Total GAD-7 Score 0 0 0 0 0      Mini-Mental    Flowsheet Row Clinical Support from 02/07/2019 in [facility]  Total Score (max 30 points ) 30      PHQ2-9    [place] Visit from 09/10/2022 in [facility] Video Visit from 08/19/2022 in [facility] Office Visit from 08/03/2022 in [facility] Visit from 06/23/2022 in [facility] Visit from 02/03/2022 in [facility]  PHQ-2 Total Score 0 0 0 0 0  PHQ-9 Total Score 0 6 3 -- --      Flowsheet Row Video Visit from 08/19/2022 in [facility] No Risk       Collaboration of Care: Collaboration of Care: Primary Care Provider AEB coordination of care  Patient/Guardian was advised Release of Information must be obtained prior to any record release in order to collaborate their care with an outside provider. Patient/Guardian  was advised if they have not already done so to contact the registration department to sign all necessary forms in order for [place] to release information regarding their care.   Consent: Patient/Guardian gives verbal consent for treatment and assignment of benefits for services provided during this visit. Patient/Guardian expressed understanding and agreed to proceed.   Televisit via video: I connected with [name] on 11/26/22 at 11:00 AM EST by a video enabled telemedicine application and verified that I am speaking with the correct person using two identifiers.  Location: Patient: home Provider: [facility]   I discussed the limitations of evaluation and management by telemedicine and the availability of in person appointments. The patient expressed understanding and agreed to proceed.  I discussed the assessment and treatment plan with the patient. The patient was provided an opportunity to ask questions and all were answered. The patient agreed with the plan and demonstrated an understanding of the instructions.   The patient was advised to call back or seek an in-person evaluation if the symptoms worsen or if the condition fails to improve as anticipated.  I provided 15 minutes of non-face-to-face time during this encounter.  Jacquelynn Cree, MD 11/26/2022, 11:19 AM

## 2022-11-26 NOTE — Patient Instructions (Signed)
For your next dose of Lexapro when you go to pick it up it will be at a lower dose of 5 mg once daily.  I have also contacted your primary care doctor to get an ECG to check your QTc interval which I am hoping is better with no longer being on Abilify and on the lower dose of Lexapro.  Please reach out to her PCP to get your memantine prescription figured out.

## 2022-12-02 ENCOUNTER — Encounter: Payer: Self-pay | Admitting: *Deleted

## 2022-12-16 ENCOUNTER — Other Ambulatory Visit (HOSPITAL_COMMUNITY): Payer: Self-pay | Admitting: Psychiatry

## 2022-12-16 DIAGNOSIS — F3341 Major depressive disorder, recurrent, in partial remission: Secondary | ICD-10-CM

## 2022-12-27 ENCOUNTER — Encounter: Payer: Self-pay | Admitting: Family Medicine

## 2022-12-28 ENCOUNTER — Encounter (HOSPITAL_COMMUNITY): Payer: Self-pay | Admitting: Psychiatry

## 2022-12-28 ENCOUNTER — Telehealth (HOSPITAL_COMMUNITY): Payer: Medicare HMO | Admitting: Psychiatry

## 2022-12-28 DIAGNOSIS — R9431 Abnormal electrocardiogram [ECG] [EKG]: Secondary | ICD-10-CM

## 2022-12-28 DIAGNOSIS — G2401 Drug induced subacute dyskinesia: Secondary | ICD-10-CM | POA: Diagnosis not present

## 2022-12-28 DIAGNOSIS — G3184 Mild cognitive impairment, so stated: Secondary | ICD-10-CM | POA: Diagnosis not present

## 2022-12-28 DIAGNOSIS — G4700 Insomnia, unspecified: Secondary | ICD-10-CM

## 2022-12-28 DIAGNOSIS — F3341 Major depressive disorder, recurrent, in partial remission: Secondary | ICD-10-CM | POA: Diagnosis not present

## 2022-12-28 DIAGNOSIS — T43505A Adverse effect of unspecified antipsychotics and neuroleptics, initial encounter: Secondary | ICD-10-CM

## 2022-12-28 MED ORDER — MIRTAZAPINE 45 MG PO TABS: 45.0000 mg | ORAL_TABLET | Freq: Every day | ORAL | 1 refills | Status: DC

## 2022-12-28 MED ORDER — MEMANTINE HCL 5 MG PO TABS: ORAL_TABLET | ORAL | 0 refills | Status: DC

## 2022-12-28 NOTE — Patient Instructions (Signed)
We increased her Remeron (mirtazapine) to 45 mg nightly today.  This will ultimately replace the Lexapro that she has been on to help protect her heart.  When he finished his current bottle of Lexapro we will plan on that being the last Lexapro she will have.  I also prescribed the memantine 5 mg nightly which will be her memory pill.  After a month she can increase this to 5 mg twice daily.  I will message your primary care doctor to get a repeat EKG in the next month.

## 2022-12-28 NOTE — Progress Notes (Signed)
[place] MD Outpatient Progress Note  12/28/2022 10:42 AM Melissa Bowman  MRN:  [id number]  Assessment:  Melissa Bowman presents for follow-up evaluation. Today, 12/28/22, patient reports ongoing good mood as confirmed by her husband. Patient still has difficulty with responses due to diminished hearing, husband will help her coordinate seeing ENT for hearing check, he bought a hearing aid [place] and she seemed to perk up when wearing this but has not seen the ENT yet. This will likely help preserve her cognition along with addition of memantine; though they have not picked up this medication at this time and reported that PCP did not remember prescribing this so we will prescribe that for them today. Will have lexapro be discontinued once they finished the current bottle which they started on Monday in ongoing effort to normalize Qtc prolongation and ask PCP to recheck ECG in the near future.  She is responding well depression and sleep wise to the Remeron and will titrate that today. Follow up in 1 month and will also see if PCP can initiate neurology referral to work up TD (which appears to be improving slightly).  Identifying Information: Melissa Bowman is a 72 y.o. female with a history of mild neurocognitive disorder, major depressive disorder, anxiety, insomnia, neuroleptic tardive dyskinesia, bradycardia with prolonged Qtc, and central sleep apnea  who is an established patient with [facility] participating in follow-up via video conferencing. Initial evaluation on 08/19/22, see that note for full case formulation. Amitriptyline discontinued due to possible drug interaction with her heart. Her CPAP was adjusted in March by her provider and her husband fine tunes at home. Trials of belsombra and ramelteon proved ineffective. Mirtazapine was next medication trial. Discontinued patient's abilify as her depression appears to be in remission outside of the symptoms of insomnia and  some decreased appetite. She was noted to have neuroleptic induced tardive dyskinesia. Also had Qtc prolongation along with sinus bradycardia on independent review of ecg from 06/2022 which was another reason to discontinue the abilify as outlined in plan. Had difficulty hearing vs some delay from abilify use vs early stages of a neurocognitive disorder. Increased concern for the latter due to patient's age, prolonged use of amitriptyline which has a higher anticholinergic load, and frequent checking with husband for answers to some questions during interview. [place] done with PCPs office after that visit showing MoCA score of 24 on recent examination on 09/10/2022 and patient was prescribed memantine but never picked it up from the pharmacy as of 12/28/2022.    Plan:   # Mild neurocognitive disorder  diminished hearing Past medication trials: memantine, hearing aids Status of problem: chronic and stable Interventions: -- Start memantine '5mg'$  nightly x1 month then increase to bid (s1/16/24) -- patient to set up hearing evaluation with ENT  # Insomnia  Central sleep apnea Past medication trials: amitriptyline (effective), belsombra, ramelteon Status of problem: improving Interventions: -- Titrate mirtazapine '45mg'$  once nightly (i10/11/23, i11/10/23, i1/16/24) -- CMP is up to date with normal sodium   # Major depressive disorder, in partial remission Past medication trials: abilify, lexapro, amitriptyline. 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   Subjective:  Chief Complaint:  No chief complaint on file.   Interval History: Melissa Bowman well today.  She is still unable to hear for virtual visits and cannot participate very actively because of this.  This is baseline.  Had a few moments since last visit with some irritability but were short lived. Tolerating dose of mirtazapine well and sleeping well. Holding up well (staying awake) more during the day. Still hasn't been able to start memantine because they haven't picked it up yet. Despite this, patient is noted by husband [name] to be more interactive than previous. Still having the extra movements in her mouth but not as much as previous and [name] notes extra movements in her feet still. They aren't bothersome to her. Haven't seen hearing aid doctor yet, now that they have new insurance we will pursue.  [name] did [place] and that seemed to cause [name] to perk up a bit more when she was wearing it.   Visit Diagnosis:  No diagnosis found.   Past Psychiatric History:  Diagnoses: mild neurocognitive disorder, major depressive disorder, anxiety, insomnia, neuroleptic tardive dyskinesia Medication trials: abilify, lexapro, amitriptyline, belsombra, melatonin, remeron (currently effective). Others she cannot remember Previous psychiatrist/therapist: biofeedback many years ago Hospitalizations: none Suicide attempts: none SIB: none Hx of violence towards others: none Current access to guns: will assess at future visit Hx of abuse: none Substance use: none  Past Medical History:  Past Medical History:  Diagnosis Date   Allergic rhinitis    Allergy to ertapenem    Anxiety 08/27/2011   Anxiety and depression    Asthmatic bronchitis    Bilateral carpal tunnel syndrome    Condyloma  acuminata    Diverticulosis    Eczema    GERD (gastroesophageal reflux disease)    H. pylori infection 11/16/1993   Hiatal hernia    History of benign adrenal tumor 2007   [facility]   History of pneumonia 2007, 2008   Hyperlipidemia    Hypertension    IBS (irritable bowel syndrome)    Internal hemorrhoids    Kyphosis    Long term current use of antipsychotic medication 08/19/2022   Memory loss    Menopause    Metabolic syndrome    OSA on CPAP    Type 2 diabetes mellitus ([place]   Vitamin D deficiency     Past Surgical History:  Procedure Laterality Date   abdominal wall lematoma of fabdd hys.     APPENDECTOMY     CARPAL TUNNEL RELEASE     CESAREAN SECTION     X2   COLONOSCOPY     INCISIONAL HERNIA REPAIR     right breast biopsy  09/1995   ROTATOR CUFF REPAIR  2002   right   ROTATOR CUFF REPAIR/ bone spur  08/30/2009   SHOULDER ARTHROSCOPY WITH ROTATOR CUFF REPAIR AND SUBACROMIAL DECOMPRESSION Right 01/19/2018   Procedure: REVISION RIGHT MINI OPEN ROTATOR CUFF REPAIR AND SUBACROMIAL DECOMPRESSION;  Surgeon: BeSusa DayMD;  Location: [facility];  Service: Orthopedics;  Laterality: Right;  90 MINS   sleep apnea surgery  04/1995   torn cartiledge rt knee x2  05/13/2006   Dr. LuRonnie Derbymoc    TOTAL ABDOMINAL HYSTERECTOMY W/ BILATERAL  SALPINGOOPHORECTOMY  07/1993   fibroids     Family Psychiatric History: both daughters with depression  Family History:  Family History  Problem Relation Age of Onset   Heart disease Mother    Parkinsonism Father    Emphysema Father    Hip fracture Father    Heart failure Sister    Ovarian cancer Sister    COPD Sister    Lymphoma Sister    COPD Sister    COPD Brother    Hypertension Brother    Heart disease Brother    COPD Brother    Colon cancer Neg Hx    Esophageal cancer Neg Hx    Pancreatic cancer Neg Hx    Stomach cancer Neg Hx    Breast cancer Neg Hx     Social History:  Social History   Socioeconomic History   Marital  status: Married    Spouse name: [name]   Number of children: 2   Years of education: some college   Highest education level: Some college, no degree  Occupational History   Occupation: retired    Comment: daycare  Tobacco Use   Smoking status: Never   Smokeless tobacco: Never  Vaping Use   Vaping Use: Never used  Substance and Sexual Activity   Alcohol use: No   Drug use: No   Sexual activity: Not on file  Other Topics Concern   Not on file  Social History Narrative   Brandyce is retired. She lives with her husband [name]. They have two grown daughters that live locally. Ignacia enjoys reading and puzzles.    Social Determinants of Health   Financial Resource Strain: Low Risk  (04/22/2021)   Overall Financial Resource Strain (CARDIA)    Difficulty of Paying Living Expenses: Not hard at all  Food Insecurity: No Food Insecurity (04/22/2021)   Hunger Vital Sign    Worried About Running Out of Food in the Last Year: Never true    Ran Out of Food in the Last Year: Never true  Transportation Needs: No Transportation Needs (04/22/2021)   PRAPARE - Hydrologist (Medical): No    Lack of Transportation (Non-Medical): No  Physical Activity: Insufficiently Active (04/23/2022)   Exercise Vital Sign    Days of Exercise per Week: 7 days    Minutes of Exercise per Session: 20 min  Stress: No Stress Concern Present (04/23/2022)   [place]    Feeling of Stress : Only a little  Social Connections: Moderately Isolated (04/22/2021)   Social Connection and Isolation Panel [NHANES]    Frequency of Communication with Friends and Family: More than three times a week    Frequency of Social Gatherings with Friends and Family: More than three times a week    Attends Religious Services: Never    Marine scientist or Organizations: No    Attends Archivist Meetings: Never    Marital Status:  Married    Allergies:  Allergies  Allergen Reactions   Statins Other (See Comments)    myalgias   E-Mycin [Erythromycin] Other (See Comments)    Medication intolerance stomach cramps   Estrogens Other (See Comments)    Estrogen replacement Therapy intolerance mood changes   Sulfa Antibiotics Other (See Comments)    Causes severe stomach cramps    Vioxx [Rofecoxib] Other (See Comments)    Unknown reaction/interaction. Possible stomach cramps    Current Medications: Current Outpatient  Medications  Medication Sig Dispense Refill   Ascorbic Acid (VITAMIN C) 1000 MG tablet Take 1,000 mg by mouth daily.     aspirin EC 81 MG tablet Take 1 tablet (81 mg total) by mouth daily.     Calcium Carb-Cholecalciferol 600-800 MG-UNIT TABS Take 2 tablets by mouth daily.     Cholecalciferol (VITAMIN D-3) 5000 UNITS TABS Take 5,000 Units by mouth daily.     escitalopram (LEXAPRO) 5 MG tablet Take 1 tablet (5 mg total) by mouth daily. 30 tablet 0   fenofibrate 160 MG tablet TAKE 1 TABLET BY MOUTH DAILY 90 tablet 1   fluticasone (FLONASE) 50 MCG/ACT nasal spray Use 2 sprays in each  nostril daily 48 g 2   irbesartan-hydrochlorothiazide (AVALIDE) 150-12.5 MG tablet TAKE 1 TABLET BY MOUTH DAILY 100 tablet 1   memantine (NAMENDA) 5 MG tablet Take 1 tablet (5 mg total) by mouth at bedtime for 30 days, THEN 1 tablet (5 mg total) 2 (two) times daily. (Patient not taking: Reported on 11/26/2022) 180 tablet 0   mirtazapine (REMERON) 30 MG tablet Take 1 tablet (30 mg total) by mouth at bedtime. 90 tablet 0   montelukast (SINGULAIR) 10 MG tablet TAKE 1 TABLET BY MOUTH DAILY 100 tablet 2   omeprazole (PRILOSEC) 40 MG capsule TAKE 1 CAPSULE BY MOUTH DAILY 100 capsule 1   PROAIR HFA 108 (90 BASE) MCG/ACT inhaler Use 2 puffs into the lungs  every 6 hours as needed for wheezing or shortness of  breath 25.5 g 2   Current Facility-Administered Medications  Medication Dose Route Frequency Provider Last Rate Last Admin    0.9 %  sodium chloride infusion  500 mL Intravenous Once Daryel November, MD        ROS: Review of Systems  HENT:         Hard of hearing  Neurological:  Positive for tremors.  Psychiatric/Behavioral:  Negative for dysphoric mood, self-injury, sleep disturbance and suicidal ideas.     Objective:  Psychiatric Specialty Exam: There were no vitals taken for this visit.There is no height or weight on file to calculate BMI.  General Appearance: Casual, Neat, Well Groomed, and wearing glasses. Appears stated age  Eye Contact:  Fair  Speech:   slight speech impediment. Delay in responses. Short sentence structure.  Volume:  Normal  Mood:   "Doing good"  Affect:  Appropriate, Congruent, and decreased range most likely due to difficulty hearing. Social smile  Thought Content: Hallucinations: None and better able to participate in interview than previously    Suicidal Thoughts:  No  Homicidal Thoughts:  No  Thought Process:  Descriptions of Associations: Intact. Concrete  Orientation:  Full (Time, Place, and Person)    Memory:  Immediate;   Poor Recent;   Poor Remote;   Fair  Judgment:  Fair  Insight:  Fair  Concentration:  Concentration: Fair and Attention Span: Fair  Recall:  [employer] of Knowledge: Fair  Language: Fair  Psychomotor Activity:  TD of jaw, lips, tongue, feet  Akathisia:  No  AIMS (if indicated): done  Assets:  Desire for Improvement Financial Resources/Insurance Housing Intimacy Leisure Time Physical Health Resilience Social Support Talents/Skills Transportation  ADL's:  Intact  Cognition: Impaired,  Mild  Sleep:  Good   PE: General: sits comfortably in view of camera; no acute distress  Pulm: no increased work of breathing on room air  MSK: all extremity movements appear intact  Neuro: resting tremor of jaw, lip  smacking, tongue tremor on extension and at rest, in feet as well Gait & Station: unable to assess by video    Metabolic Disorder  Labs: Lab Results  Component Value Date   HGBA1C 5.2 09/10/2022   MPG 134.11 01/12/2018   No results found for: "PROLACTIN" Lab Results  Component Value Date   CHOL 165 02/03/2022   TRIG 100 02/03/2022   HDL 42 02/03/2022   CHOLHDL 3.9 02/03/2022   LDLCALC 105 (H) 02/03/2022   LDLCALC 83 05/01/2021   Lab Results  Component Value Date   TSH 2.290 01/21/2021   TSH 1.820 05/27/2020    Therapeutic Level Labs: No results found for: "LITHIUM" No results found for: "VALPROATE" No results found for: "CBMZ"  Screenings:  [place] Office Visit from 09/10/2022 in [place] Visit from 08/03/2022 in [place] Office Visit from 06/23/2022 in [place] Visit from 02/03/2022 in [place] Visit from 08/03/2021 in [place]  Total GAD-7 Score 0 0 0 0 0      Mini-Mental    Flowsheet Row Clinical Support from 02/07/2019 in [place]  Total Score (max 30 points ) 30      PHQ2-9    [place] Visit from 09/10/2022 in [place] Video Visit from 08/19/2022 in [place] Office Visit from 08/03/2022 in [place] Visit from 06/23/2022 in [place] Visit from 02/03/2022 in [place]  PHQ-2 Total Score 0 0 0 0 0  PHQ-9 Total Score 0 6 3 -- --      Flowsheet Row Video Visit from 08/19/2022 in [place] No Risk       Collaboration of Care: Collaboration of Care: Primary Care Provider AEB coordination of care  Patient/Guardian was advised Release of Information must be obtained prior to any record release in order to collaborate their care with an outside provider. Patient/Guardian was  advised if they have not already done so to contact the registration department to sign all necessary forms in order for [place] to release information regarding their care.   Consent: Patient/Guardian gives verbal consent for treatment and assignment of benefits for services provided during this visit. Patient/Guardian expressed understanding and agreed to proceed.   Televisit via video: I connected with [name] on 12/28/22 at 11:00 AM EST by a video enabled telemedicine application and verified that I am speaking with the correct person using two identifiers.  Location: Patient: home Provider: Home office   I discussed the limitations of evaluation and management by telemedicine and the availability of in person appointments. The patient expressed understanding and agreed to proceed.  I discussed the assessment and treatment plan with the patient. The patient was provided an opportunity to ask questions and all were answered. The patient agreed with the plan and demonstrated an understanding of the instructions.   The patient was advised to call back or seek an in-person evaluation if the symptoms worsen or if the condition fails to improve as anticipated.  I provided 15 minutes of non-face-to-face time during this encounter.  Jacquelynn Cree, MD 12/28/2022, 10:42 AM

## 2022-12-28 NOTE — Telephone Encounter (Signed)
-----  Message from Janora Norlander, DO sent at 12/28/2022 12:56 PM EST ----- She is established with Neuro.  Last OV for that movement was in 2022 I believe.  They thought it was induced by Abilify but patient did not feel she could come off of it.  Referral should still be good, she just needs to set up an appointment with them to follow up.  I will get our clinic to get her set up for EKG repeat.  Ashna Dorough, can you get Ms Zemanek set up for repeat EKG after 2/20?  ----- Message ----- From: Jacquelynn Cree, MD Sent: 12/28/2022  11:12 AM EST To: Janora Norlander, DO  Dr. Lajuana Ripple,  I went ahead and prescribed the memantine for [facility] since they have been having trouble getting it previously.  I will also plan on the Lexapro being the last she will have of this medication when they run out of the current pill bottle to assist with her QTc prolongation.  Would you be willing to recheck an EKG after she is off his medication?  Any time after February 20 should work well.  She been able to get a neurology referral for her tardive dyskinesia yet? Thanks Sam

## 2022-12-28 NOTE — Progress Notes (Signed)
Pt has been scheduled.  °

## 2022-12-29 ENCOUNTER — Encounter (HOSPITAL_COMMUNITY): Payer: Self-pay

## 2023-01-28 ENCOUNTER — Telehealth (INDEPENDENT_AMBULATORY_CARE_PROVIDER_SITE_OTHER): Payer: Medicare HMO | Admitting: Psychiatry

## 2023-01-28 ENCOUNTER — Encounter (HOSPITAL_COMMUNITY): Payer: Self-pay | Admitting: Psychiatry

## 2023-01-28 DIAGNOSIS — G3184 Mild cognitive impairment, so stated: Secondary | ICD-10-CM

## 2023-01-28 DIAGNOSIS — T43505A Adverse effect of unspecified antipsychotics and neuroleptics, initial encounter: Secondary | ICD-10-CM

## 2023-01-28 DIAGNOSIS — G2401 Drug induced subacute dyskinesia: Secondary | ICD-10-CM | POA: Diagnosis not present

## 2023-01-28 DIAGNOSIS — G4737 Central sleep apnea in conditions classified elsewhere: Secondary | ICD-10-CM | POA: Diagnosis not present

## 2023-01-28 DIAGNOSIS — F3341 Major depressive disorder, recurrent, in partial remission: Secondary | ICD-10-CM | POA: Diagnosis not present

## 2023-01-28 DIAGNOSIS — H9193 Unspecified hearing loss, bilateral: Secondary | ICD-10-CM

## 2023-01-28 NOTE — Progress Notes (Signed)
[name] Outpatient Progress Note  01/28/2023 11:21 AM Melissa Bowman  MRN:  [id number]  Assessment:  [name] presents for follow-up evaluation. Today, 01/28/23, patient reports ongoing good mood as confirmed by her husband.  No worsening of mood or increased irritability with discontinuing Lexapro.  We will still ask PCP to recheck ECG in the near future to make sure QTc is normalized with discontinuing Lexapro. Patient still has difficulty with responses due to diminished hearing, husband will help her coordinate seeing ENT for hearing check, have been delayed due to physical illness. This will likely help preserve her cognition along with addition of memantine and she has been on it for nearly a month at this time.  Some improvement to memory noted already as she was able to say when she started the medication and when the plan for increasing it was going to take place. Neurology referral and placed to work up TD (which appears to be improving slightly). Follow up in 1 month.   Identifying Information: Melissa Bowman is a 72 y.o. female with a history of mild neurocognitive disorder, major depressive disorder, anxiety, insomnia, neuroleptic tardive dyskinesia, bradycardia with prolonged Qtc, and central sleep apnea  who is an established patient with [facility] participating in follow-up via video conferencing. Initial evaluation on 08/19/22, see that note for full case formulation. Amitriptyline discontinued due to possible drug interaction with her heart. Her CPAP was adjusted in March by her provider and her husband fine tunes at home. Trials of belsombra and ramelteon proved ineffective. Mirtazapine was next medication trial. Discontinued patient's abilify as her depression appears to be in remission outside of the symptoms of insomnia and some decreased appetite. She was noted to have neuroleptic induced tardive dyskinesia. Also had Qtc prolongation along with sinus  bradycardia on independent review of ecg from 06/2022 which was another reason to discontinue the abilify as outlined in plan. Had difficulty hearing vs some delay from abilify use vs early stages of a neurocognitive disorder. Increased concern for the latter due to patient's age, prolonged use of amitriptyline which has a higher anticholinergic load, and frequent checking with husband for answers to some questions during interview. [place] done with PCPs office after that visit showing MoCA score of 24 on recent examination on 09/10/2022 and patient was prescribed memantine but never picked it up from the pharmacy as of 12/28/2022.    Plan:   # Mild neurocognitive disorder  diminished hearing Past medication trials: memantine, hearing aids Status of problem: chronic and stable Interventions: -- Continue memantine 7m nightly x1 month then increase to bid on 02/04/2023 (s1/16/24) -- patient to set up hearing evaluation with ENT  # Insomnia  Central sleep apnea Past medication trials: amitriptyline (effective), belsombra, ramelteon Status of problem: improving Interventions: -- continue mirtazapine 475monce nightly (i10/11/23, i11/10/23, i1/16/24) -- CMP is up to date with normal sodium   # Major depressive disorder, in partial remission Past medication trials: abilify, lexapro, amitriptyline. 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  Subjective:  Chief Complaint:  Chief Complaint   Patient presents with   Cognitive impairment   Depression   Follow-up    Interval History: Melissa Bowman doing well today.  She finished the lexapro and has no worsening of her mood. Melissa Bowman still sleeping well, not noticing the foot movement as much while asleep but the mouth movements still present especially when asleep. Sounds sort of like gagging. Has been snoring some and [name] adjusted the CPAP. They will have neurologist take a look and will meet with them soon. Will need to switch medication to [facility] [place]. Due to [place] getting ill haven't been able to go to ENT yet. Were able to start the memantine and Melissa Bowman says this is going well and will start 2 pills on the 23rd. Has noticed some dry eyes and had previous surgery.   Visit Diagnosis:    ICD-10-CM   1. Mild cognitive impairment  G31.84     2. Recurrent major depressive disorder, in partial remission ([place])  F33.41     3. Hearing decreased, bilateral  H91.93     4. Central sleep apnea due to medical condition  G47.37     5. Neuroleptic-induced tardive dyskinesia  G24.01    T43.505A       Past Psychiatric History:  Diagnoses: mild neurocognitive disorder, major depressive disorder, anxiety, insomnia, neuroleptic tardive dyskinesia Medication trials: abilify, lexapro, amitriptyline, belsombra, melatonin, remeron (currently effective). Others she cannot remember Previous psychiatrist/therapist: biofeedback many years ago Hospitalizations: none Suicide attempts: none SIB: none Hx of violence towards others: none Current access to guns: will assess at future visit Hx of abuse: none Substance use: none  Past Medical History:  Past Medical History:  Diagnosis Date   Allergic rhinitis    Allergy to ertapenem    Anxiety 08/27/2011   Anxiety and depression    Asthmatic bronchitis    Bilateral carpal tunnel syndrome    Condyloma acuminata    Diverticulosis    Eczema    GERD (gastroesophageal reflux disease)    H.  pylori infection 11/16/1993   Hiatal hernia    History of benign adrenal tumor 2007   [facility]   History of pneumonia 2007, 2008   Hyperlipidemia    Hypertension    IBS (irritable bowel syndrome)    Internal hemorrhoids    Kyphosis    Long term current use of antipsychotic medication 08/19/2022   Memory loss    Menopause    Metabolic syndrome    OSA on CPAP    Type 2 diabetes mellitus ([place])    Vitamin D deficiency     Past Surgical History:  Procedure Laterality Date   abdominal wall lematoma of fabdd hys.     APPENDECTOMY     CARPAL TUNNEL RELEASE     CESAREAN SECTION     X2   COLONOSCOPY     INCISIONAL HERNIA REPAIR     right breast biopsy  09/1995   ROTATOR CUFF REPAIR  2002   right   ROTATOR CUFF REPAIR/ bone spur  08/30/2009   SHOULDER ARTHROSCOPY WITH ROTATOR CUFF REPAIR AND SUBACROMIAL DECOMPRESSION Right 01/19/2018   Procedure: REVISION RIGHT MINI OPEN ROTATOR CUFF REPAIR AND SUBACROMIAL DECOMPRESSION;  Surgeon: Susa Day, MD;  Location: [facility];  Service: Orthopedics;  Laterality: Right;  90 MINS   sleep apnea surgery  04/1995   torn cartiledge rt knee x2  05/13/2006   Dr. Ronnie Derby [facility]    TOTAL ABDOMINAL HYSTERECTOMY W/ BILATERAL  SALPINGOOPHORECTOMY  07/1993   fibroids     Family Psychiatric History: both daughters with depression  Family History:  Family History  Problem Relation Age of Onset   Heart disease Mother    Parkinsonism Father    Emphysema Father    Hip fracture Father    Heart failure Sister    Ovarian cancer Sister    COPD Sister    Lymphoma Sister    COPD Sister    COPD Brother    Hypertension Brother    Heart disease Brother    COPD Brother    Colon cancer Neg Hx    Esophageal cancer Neg Hx    Pancreatic cancer Neg Hx    Stomach cancer Neg Hx    Breast cancer Neg Hx     Social History:  Social History   Socioeconomic History   Marital status: Married    Spouse name: [name]   Number of children: 2   Years of education: some  college   Highest education level: Some college, no degree  Occupational History   Occupation: retired    Comment: daycare  Tobacco Use   Smoking status: Never   Smokeless tobacco: Never  Vaping Use   Vaping Use: Never used  Substance and Sexual Activity   Alcohol use: No   Drug use: No   Sexual activity: Not on file  Other Topics Concern   Not on file  Social History Narrative   Sravya is retired. She lives with her husband [name]. They have two grown daughters that live locally. Imanii enjoys reading and puzzles.    Social Determinants of Health   Financial Resource Strain: Low Risk  (04/22/2021)   Overall Financial Resource Strain (CARDIA)    Difficulty of Paying Living Expenses: Not hard at all  Food Insecurity: No Food Insecurity (04/22/2021)   Hunger Vital Sign    Worried About Running Out of Food in the Last Year: Never true    Ran Out of Food in the Last Year: Never true  Transportation Needs: No Transportation Needs (04/22/2021)   PRAPARE - Hydrologist (Medical): No    Lack of Transportation (Non-Medical): No  Physical Activity: Insufficiently Active (04/23/2022)   Exercise Vital Sign    Days of Exercise per Week: 7 days    Minutes of Exercise per Session: 20 min  Stress: No Stress Concern Present (04/23/2022)   [place]    Feeling of Stress : Only a little  Social Connections: Moderately Isolated (04/22/2021)   Social Connection and Isolation Panel [NHANES]    Frequency of Communication with Friends and Family: More than three times a week    Frequency of Social Gatherings with Friends and Family: More than three times a week    Attends Religious Services: Never    Marine scientist or Organizations: No    Attends Archivist Meetings: Never    Marital Status: Married    Allergies:  Allergies  Allergen Reactions   Statins Other (See Comments)     myalgias   E-Mycin [Erythromycin] Other (See Comments)    Medication intolerance stomach cramps   Estrogens Other (See Comments)    Estrogen replacement Therapy intolerance mood changes   Sulfa Antibiotics Other (See Comments)    Causes severe stomach cramps    Vioxx [Rofecoxib] Other (See Comments)    Unknown reaction/interaction. Possible stomach cramps    Current Medications: Current Outpatient  Medications  Medication Sig Dispense Refill   Ascorbic Acid (VITAMIN C) 1000 MG tablet Take 1,000 mg by mouth daily.     aspirin EC 81 MG tablet Take 1 tablet (81 mg total) by mouth daily.     Calcium Carb-Cholecalciferol 600-800 MG-UNIT TABS Take 2 tablets by mouth daily.     Cholecalciferol (VITAMIN D-3) 5000 UNITS TABS Take 5,000 Units by mouth daily.     fenofibrate 160 MG tablet TAKE 1 TABLET BY MOUTH DAILY 90 tablet 1   fluticasone (FLONASE) 50 MCG/ACT nasal spray Use 2 sprays in each  nostril daily 48 g 2   irbesartan-hydrochlorothiazide (AVALIDE) 150-12.5 MG tablet TAKE 1 TABLET BY MOUTH DAILY 100 tablet 1   memantine (NAMENDA) 5 MG tablet Take 1 tablet (5 mg total) by mouth at bedtime for 30 days, THEN 1 tablet (5 mg total) 2 (two) times daily. 90 tablet 0   mirtazapine (REMERON) 45 MG tablet Take 1 tablet (45 mg total) by mouth at bedtime. 90 tablet 1   montelukast (SINGULAIR) 10 MG tablet TAKE 1 TABLET BY MOUTH DAILY 100 tablet 2   omeprazole (PRILOSEC) 40 MG capsule TAKE 1 CAPSULE BY MOUTH DAILY 100 capsule 1   PROAIR HFA 108 (90 BASE) MCG/ACT inhaler Use 2 puffs into the lungs  every 6 hours as needed for wheezing or shortness of  breath 25.5 g 2   Current Facility-Administered Medications  Medication Dose Route Frequency Provider Last Rate Last Admin   0.9 %  sodium chloride infusion  500 mL Intravenous Once Daryel November, MD        ROS: Review of Systems  HENT:         Hard of hearing  Neurological:  Positive for tremors.  Psychiatric/Behavioral:  Negative for  dysphoric mood, self-injury, sleep disturbance and suicidal ideas.     Objective:  Psychiatric Specialty Exam: There were no vitals taken for this visit.There is no height or weight on file to calculate BMI.  General Appearance: Casual, Neat, Well Groomed, and wearing glasses. Appears stated age  Eye Contact:  Fair  Speech:   slight speech impediment. Delay in responses. Short sentence structure.  Volume:  Normal  Mood:   "Doing good"  Affect:  Appropriate, Congruent, and decreased range most likely due to difficulty hearing. Social smile  Thought Content: Hallucinations: None and better able to participate in interview than previously    Suicidal Thoughts:  No  Homicidal Thoughts:  No  Thought Process:  Descriptions of Associations: Intact. Concrete  Orientation:  Full (Time, Place, and Person)    Memory:  Immediate;   Poor Recent;   Poor Remote;   Fair  Judgment:  Fair  Insight:  Fair  Concentration:  Concentration: Fair and Attention Span: Fair  Recall:  [employer] of Knowledge: Fair  Language: Fair  Psychomotor Activity:  TD of jaw, lips, tongue, feet  Akathisia:  No  AIMS (if indicated): Not done today  Assets:  Desire for Improvement Financial Resources/Insurance Housing Intimacy Leisure Time Physical Health Resilience Social Support Talents/Skills Transportation  ADL's:  Intact  Cognition: Impaired,  Mild  Sleep:  Good   PE: General: sits comfortably in view of camera; no acute distress  Pulm: no increased work of breathing on room air  MSK: all extremity movements appear intact  Neuro: resting tremor of jaw, lip smacking, tongue tremor on extension and at rest, in feet as well Gait & Station: unable to assess by video  Metabolic Disorder Labs: Lab Results  Component Value Date   HGBA1C 5.2 09/10/2022   MPG 134.11 01/12/2018   No results found for: "PROLACTIN" Lab Results  Component Value Date   CHOL 165 02/03/2022   TRIG 100 02/03/2022   HDL 42  02/03/2022   CHOLHDL 3.9 02/03/2022   LDLCALC 105 (H) 02/03/2022   LDLCALC 83 05/01/2021   Lab Results  Component Value Date   TSH 2.290 01/21/2021   TSH 1.820 05/27/2020    Therapeutic Level Labs: No results found for: "LITHIUM" No results found for: "VALPROATE" No results found for: "CBMZ"  Screenings:  [facility] Office Visit from 09/10/2022 in [facility] Office Visit from 08/03/2022 in [facility] Office Visit from 06/23/2022 in [facility] Office Visit from 02/03/2022 in [facility] Office Visit from 08/03/2021 in [facility]  Total GAD-7 Score 0 0 0 0 0      Mini-Mental    Flowsheet Row Clinical Support from 02/07/2019 in [facility]  Total Score (max 30 points ) 30      PHQ2-9    [facility] Visit from 09/10/2022 in [facility] Video Visit from 08/19/2022 in [facility] Visit from 08/03/2022 in [facility] Office Visit from 06/23/2022 in [facility] Office Visit from 02/03/2022 in [facility]  PHQ-2 Total Score 0 0 0 0 0  PHQ-9 Total Score 0 6 3 -- --      Flowsheet Row Video Visit from 08/19/2022 in [facility] [place] No Risk       Collaboration of Care: Collaboration of Care: Primary Care Provider AEB coordination of care  Patient/Guardian was advised Release of Information must be obtained prior to any record release in order to collaborate their care with an outside provider. Patient/Guardian was advised if they have not already done so to contact the registration department to sign all necessary forms  in order for [place] to release information regarding their care.   Consent: Patient/Guardian gives verbal consent for treatment and assignment of benefits for services provided during this visit. Patient/Guardian expressed understanding and agreed to proceed.   Televisit via video: I connected with [name] on 01/28/23 at 11:00 AM EST by a video enabled telemedicine application and verified that I am speaking with the correct person using two identifiers.  Location: Patient: home Provider: Home office   I discussed the limitations of evaluation and management by telemedicine and the availability of in person appointments. The patient expressed understanding and agreed to proceed.  I discussed the assessment and treatment plan with the patient. The patient was provided an opportunity to ask questions and all were answered. The patient agreed with the plan and demonstrated an understanding of the instructions.   The patient was advised to call back or seek an in-person evaluation if the symptoms worsen or if the condition fails to improve as anticipated.  I provided 15 minutes of non-face-to-face time during this encounter.  Jacquelynn Cree, MD 01/28/2023, 11:21 AM

## 2023-01-28 NOTE — Patient Instructions (Signed)
We did not make any medication changes today.  When you get a chance try to schedule with the ENT doctor to see about getting better hearing aids.  You could also consult with your PCP to see if there are any eyedrops that they prefer.  When he sees a neurologist have been take a look at the extra movements in Melissa Bowman's mouth and tongue and if any are present in her feet.

## 2023-02-01 ENCOUNTER — Encounter: Payer: Self-pay | Admitting: Family Medicine

## 2023-02-01 ENCOUNTER — Encounter (HOSPITAL_COMMUNITY): Payer: Self-pay

## 2023-02-01 ENCOUNTER — Ambulatory Visit (INDEPENDENT_AMBULATORY_CARE_PROVIDER_SITE_OTHER): Payer: Medicare HMO | Admitting: Family Medicine

## 2023-02-01 VITALS — BP 146/65 | HR 62 | Temp 99.8°F | Ht 61.0 in | Wt 162.5 lb

## 2023-02-01 DIAGNOSIS — Z5181 Encounter for therapeutic drug level monitoring: Secondary | ICD-10-CM

## 2023-02-01 DIAGNOSIS — F32A Depression, unspecified: Secondary | ICD-10-CM

## 2023-02-01 DIAGNOSIS — R509 Fever, unspecified: Secondary | ICD-10-CM | POA: Diagnosis not present

## 2023-02-01 DIAGNOSIS — F419 Anxiety disorder, unspecified: Secondary | ICD-10-CM | POA: Diagnosis not present

## 2023-02-01 DIAGNOSIS — R69 Illness, unspecified: Secondary | ICD-10-CM | POA: Diagnosis not present

## 2023-02-01 NOTE — Progress Notes (Signed)
Subjective: CC: EKG PCP: Melissa Norlander, DO [id number] [name] is a 72 y.o. female presenting to clinic today for:  1.  Anxiety depression Patient reports good control of anxiety depression with mirtazapine.  She is off of the atypical antipsychotic and SSRI and feels like she is doing really well.  She is here for repeat EKG to ensure that QTc prolongation has resolved.  2.  Febrile illness Patient reports that she is been having an intermittent fever anywhere from 100- 100.4 F for about the last 7 days.  Maybe a mild intermittent headache but really no other symptoms.  Denies any cough, congestion, rhinorrhea, hemoptysis, nasal discharge.  No dysuria, hematuria.  No nausea, vomiting or myalgia.  No known sick contacts.  She is self tested x 2 for COVID and this has been negative on Thursday and Saturday of last week.   ROS: Per HPI  Allergies  Allergen Reactions   Statins Other (See Comments)    myalgias   E-Mycin [Erythromycin] Other (See Comments)    Medication intolerance stomach cramps   Estrogens Other (See Comments)    Estrogen replacement Therapy intolerance mood changes   Sulfa Antibiotics Other (See Comments)    Causes severe stomach cramps    Vioxx [Rofecoxib] Other (See Comments)    Unknown reaction/interaction. 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 pylori infection 11/16/1993   Hiatal hernia    History of benign adrenal tumor 2007   [facility]   History of pneumonia 2007, 2008   Hyperlipidemia    Hypertension    IBS (irritable bowel syndrome)    Internal hemorrhoids    Kyphosis    Long term current use of antipsychotic medication 08/19/2022   Memory loss    Menopause    Metabolic  syndrome    OSA on CPAP    Type 2 diabetes mellitus (HCC)    Vitamin D deficiency     Current Outpatient Medications:    Ascorbic Acid (VITAMIN C) 1000 MG tablet, Take 1,000 mg by mouth daily., Disp: , Rfl:    Calcium Carb-Cholecalciferol 600-800 MG-UNIT TABS, Take 2 tablets by mouth daily., Disp: , Rfl:    Cholecalciferol (VITAMIN D-3) 5000 UNITS TABS, Take 5,000 Units by mouth daily., Disp: , Rfl:    fenofibrate 160 MG tablet, TAKE 1 TABLET BY MOUTH DAILY, Disp: 90 tablet, Rfl: 1   fluticasone (FLONASE) 50 MCG/ACT nasal spray, Use 2 sprays in each  nostril daily, Disp: 48 g, Rfl: 2   irbesartan-hydrochlorothiazide (AVALIDE) 150-12.5 MG tablet, TAKE 1 TABLET BY MOUTH DAILY, Disp: 100 tablet, Rfl: 1   memantine (NAMENDA) 5 MG tablet, Take 1 tablet (5 mg total) by mouth at bedtime for 30 days, THEN 1 tablet (5 mg total) 2 (two) times daily., Disp: 90 tablet, Rfl: 0   mirtazapine (REMERON) 45 MG tablet, Take 1 tablet (45 mg total) by mouth at bedtime., Disp: 90 tablet, Rfl: 1   montelukast (SINGULAIR) 10 MG tablet, TAKE 1 TABLET BY MOUTH DAILY, Disp: 100 tablet, Rfl: 2   omeprazole (PRILOSEC) 40 MG capsule, TAKE 1 CAPSULE BY MOUTH DAILY, Disp: 100 capsule, Rfl: 1  PROAIR HFA 108 (90 BASE) MCG/ACT inhaler, Use 2 puffs into the lungs  every 6 hours as needed for wheezing or shortness of  breath, Disp: 25.5 g, Rfl: 2   aspirin EC 81 MG tablet, Take 1 tablet (81 mg total) by mouth daily. (Patient not taking: Reported on 02/01/2023), Disp: , Rfl:   Current Facility-Administered Medications:    0.9 %  sodium chloride infusion, 500 mL, Intravenous, Once, Daryel November, MD Social History   Socioeconomic History   Marital status: Married    Spouse name: Melissa Bowman   Number of children: 2   Years of education: some college   Highest education level: Some college, no degree  Occupational History   Occupation: retired    Comment: daycare  Tobacco Use   Smoking status: Never   Smokeless  tobacco: Never  Scientific laboratory technician Use: Never used  Substance and Sexual Activity   Alcohol use: No   Drug use: No   Sexual activity: Not on file  Other Topics Concern   Not on file  Social History Narrative   Melissa Bowman is retired. She lives with her husband Melissa Bowman. They have two grown daughters that live locally. Melissa Bowman enjoys reading and puzzles.    Social Determinants of Health   Financial Resource Strain: Low Risk  (04/22/2021)   Overall Financial Resource Strain (CARDIA)    Difficulty of Paying Living Expenses: Not hard at all  Food Insecurity: No Food Insecurity (04/22/2021)   Hunger Vital Sign    Worried About Running Out of Food in the Last Year: Never true    Ran Out of Food in the Last Year: Never true  Transportation Needs: No Transportation Needs (04/22/2021)   PRAPARE - Hydrologist (Medical): No    Lack of Transportation (Non-Medical): No  Physical Activity: Insufficiently Active (04/23/2022)   Exercise Vital Sign    Days of Exercise per Week: 7 days    Minutes of Exercise per Session: 20 min  Stress: No Stress Concern Present (04/23/2022)   [place]    Feeling of Stress : Only a little  Social Connections: Moderately Isolated (04/22/2021)   Social Connection and Isolation Panel [NHANES]    Frequency of Communication with Friends and Family: More than three times a week    Frequency of Social Gatherings with Friends and Family: More than three times a week    Attends Religious Services: Never    Marine scientist or Organizations: No    Attends Archivist Meetings: Never    Marital Status: Married  Human resources officer Violence: Not At Risk (04/22/2021)   Humiliation, Afraid, Rape, and Kick questionnaire    Fear of Current or Ex-Partner: No    Emotionally Abused: No    Physically Abused: No    Sexually Abused: No   Family History  Problem Relation Age of  Onset   Heart disease Mother    Parkinsonism Father    Emphysema Father    Hip fracture Father    Heart failure Sister    Ovarian cancer Sister    COPD Sister    Lymphoma Sister    COPD Sister    COPD Brother    Hypertension Brother    Heart disease Brother    COPD Brother    Colon cancer Neg Hx    Esophageal cancer Neg Hx    Pancreatic cancer Neg Hx    Stomach  cancer Neg Hx    Breast cancer Neg Hx     Objective: Office vital signs reviewed. BP (!) 146/65   Pulse 62   Temp 99.8 F (37.7 C) (Oral)   Ht 5' 1"$  (1.549 m)   Wt 162 lb 8 oz (73.7 kg)   SpO2 97%   BMI 30.70 kg/m   Physical Examination:  General: Awake, alert, well nourished, No acute distress HEENT: Normal    Neck: No masses palpated. No lymphadenopathy    Ears: Tympanic membranes intact, normal light reflex, no erythema, no bulging    Eyes: PERRLA, extraocular membranes intact, sclera white    Nose: nasal turbinates moist, no nasal discharge    Throat: moist mucus membranes, no erythema, no tonsillar exudate.  Airway is patent Cardio: regular rate and rhythm, S1S2 heard, no murmurs appreciated Pulm: clear to auscultation bilaterally, no wheezes, rhonchi or rales; normal work of breathing on room air    Assessment/ Plan: 72 y.o. female   Anxiety and depression - Plan: EKG 12-Lead  Medication monitoring encounter - Plan: EKG 12-Lead  Fever, unspecified fever cause - Plan: COVID-19, Flu A+B and RSV  Doing well on the has obtained.  No QTc prolongation.  Will CC results and chart to Dr. Nehemiah Settle  Uncertain etiology of fever.  No apparent cause.  Triple swab ordered.  Outside of the window for treatment of COVID and influenza if these are positive.  She has no evidence of secondary bacterial infection but we discussed signs and symptoms which would warrant antibiotics.  She will contact me if these arise.  In the meantime, okay to use Tylenol if needed for fever, hydrate well.  Orders Placed This  Encounter  Procedures   EKG 12-Lead   No orders of the defined types were placed in this encounter.    Melissa Norlander, DO [place] [phone]

## 2023-02-02 LAB — COVID-19, FLU A+B AND RSV
Influenza A, NAA: NOT DETECTED
Influenza B, NAA: NOT DETECTED
RSV, NAA: NOT DETECTED
SARS-CoV-2, NAA: NOT DETECTED

## 2023-02-06 ENCOUNTER — Encounter: Payer: Self-pay | Admitting: Family Medicine

## 2023-02-15 ENCOUNTER — Encounter: Payer: Self-pay | Admitting: Family Medicine

## 2023-02-15 ENCOUNTER — Encounter (HOSPITAL_COMMUNITY): Payer: Self-pay

## 2023-02-15 MED ORDER — FENOFIBRATE 160 MG PO TABS: 160.0000 mg | ORAL_TABLET | Freq: Every day | ORAL | 1 refills | Status: DC

## 2023-02-18 DIAGNOSIS — G4733 Obstructive sleep apnea (adult) (pediatric): Secondary | ICD-10-CM | POA: Diagnosis not present

## 2023-02-22 ENCOUNTER — Ambulatory Visit: Payer: Medicare HMO | Admitting: Neurology

## 2023-02-22 ENCOUNTER — Encounter: Payer: Self-pay | Admitting: Neurology

## 2023-02-22 VITALS — BP 124/72 | HR 66 | Ht 60.0 in | Wt 163.0 lb

## 2023-02-22 DIAGNOSIS — G4733 Obstructive sleep apnea (adult) (pediatric): Secondary | ICD-10-CM | POA: Diagnosis not present

## 2023-02-22 DIAGNOSIS — G2119 Other drug induced secondary parkinsonism: Secondary | ICD-10-CM | POA: Diagnosis not present

## 2023-02-22 NOTE — Patient Instructions (Signed)
We can see you in 1 year, you can see one of our nurse practitioners as you are stable with your AutoPap machine.

## 2023-02-22 NOTE — Progress Notes (Signed)
Subjective:    Patient ID: Melissa Bowman is a 72 y.o. female.  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 I saw her on 02/05/2021 for sleep apnea evaluation.  She had a home sleep test in April 2022 which showed evidence of severe sleep apnea.  I wrote for a new AutoPap machine.  She started treatment with her machine in the spring 2022.  She had a follow-up appointment with Frann Rider, NP on 11/03/2021, at which time she was compliant with her AutoPap machine.  She was advised to follow-up in 1 year.  Today, 02/22/2023: She is compliant with her AutoPap machine of 8 to 13 cm, she is fully compliant with treatment, average usage is over 11 hours.  She uses a nasal mask.  She needs new supplies.  Her husband reports that she has had recent medication changes, she is no longer on Abilify, she is not on Lexapro any longer, she was started on mirtazapine.      The patient's allergies, current medications, family history, past medical history, past social history, past surgical history and problem list were reviewed and updated as appropriate.    Previously:    02/05/21: (She) presents for a new problem visit for evaluation of her sleep apnea.  The patient is accompanied by her husband today, and is referred by her primary care physician, Dr. Lajuana Ripple.    She reports being consistent with her CPAP.  She did not bring her machine, per husband, it is automatic PAP machine.  He purchased this machine from a local pharmacy after her first 1 broke.  He gets supplies on [employer].  He reports that she was diagnosed with severe  sleep apnea.  Testing was over 10 years ago and this machine is at least 73 years old.  She uses a nasal mask.  She has benefited from treatment.  Her original sleep apnea diagnosis may date back much further since she had UPPP and tonsillectomy under Dr. Erik Obey in [phone] per patient (per chart review: in May 1996).  She goes to bed around 930 and sleeps typically till 730.  If her husband is not there in the morning because he is picking up grandchildren or running an errand, she may sleep till 930.  She does feel sleepy during the day.  Her Epworth sleepiness score is 12 out of 24, fatigue severity score is 57 out of 63.  She drinks caffeine in the form of coffee, 2 cups in the morning.  She is a non-smoker and does not drink alcohol.   She reports that she cannot sleep without CPAP.I have evaluated her for her gait disorder and parkinsonism and last saw her on 11/20/2020, at which time we talked about her benign DaTscan results.  She was advised to proceed with a brain MRI without contrast.   She had an interim brain MRI without contrast on 11/30/2020 and I reviewed the results:    IMPRESSION: This MRI of the brain without contrast shows the following: 1.   Scattered T2/FLAIR hyperintense foci in the hemispheres, right internal capsule and right basal ganglia consistent with chronic microvascular ischemic change.  None of the foci appear to be acute.  Compared to the CT scan from 2018, there do do not appear to be any new lesions. 2.   Left chronic sphenoid sinusitis.   3.   No acute findings.   We called her with her test results.     I first met her at the request of her primary care physician on 08/06/2020, at which time she had a several month history of difficulty with her balance and mobility, changes in her posture and her gait.  She had evidence of parkinsonism without telltale lateralization.  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  She was on Abilify at the time. I suggested we  proceed with a DaTscan.  We delayed the DaTscan until she was off of Abilify as she was weaned off of it.     She had a DaT scan on 11/13/20 and I reviewed the results: IMPRESSION: Normal DATscan. Findings are NOT suggestive of Parkinson's syndrome Pathology   They were notified of the test results by phone call.   11/20/20: She reports feeling the same, not much different, certainly not worse. Per daughter, her trembling and mobility may be a little bit better. She was never actually completely off of the Abilify and reduce it to 2.5 mg daily but felt better on 5 mg daily. Daughter reports that patient's husband has also noted decline in the mood since the Abilify was reduced. The patient has hearing aids but never liked using them and does not typically use them now. She is compliant with her CPAP but has not had reevaluation in years, she purchased a CPAP machine or AutoPap machine out-of-pocket several years ago, she had a gap in her insurance at the time and needed a new machine. Sleep testing was probably 15 or 20 years ago. Memory is more or less stable, she is forgetful. Her daughter is concerned that she may have had a lacunar stroke before. Back in April, around Easter, in fact when they had a family gathering and were taking pictures for Easter, patient had an episode of not being fully attentive, not acting her typical self, no obvious one-sided weakness or numbness or slurring of speech or droopy face at the time. She has not had a recent brain MRI.     08/06/20: (She) reports very little of her own history, primarily due to hearing loss. Her history is primarily provided by her husband, who reports that since approximately April of this year the patient has had more difficulty with her balance and mobility.  She has had changes in her mood.  He reports that she has a longstanding history of depression and anxiety and has tried different medications.  She was on Effexor for years and recently  she was placed on Abilify and Lexapro.  This was around March 2021.  She has had changes in her posture and her gait, she has been noted to be slower in her movements.  This has also been noted by her daughter who is a Marine scientist.  She endorses stress, particularly with regards to her other daughter.  Both daughters live close by.  She has a total of 5 grandchildren and has been taking care of 2 grandchildren ages 25 and 31 but the 44-year-old recently started school which has improved her stress level she indicates.  She has not fallen recently but has fallen in the past.  She is the youngest of a total of 16 children and most of her siblings passed away, she denies any history of tremor in her  siblings or memory loss but reports that her father had Parkinson's disease and died at age 48 and her brother had Parkinson's disease and Alzheimer's dementia and died in his 62s, probably around 73.  She has now 3 sisters alive and one more brother alive.  She is a non-smoker and drinks no alcohol, she likes to drink coffee and drinks about 2 to 3 cups/day.  She has had unintended weight loss since approximately April of this year and had some work-up for this.  She denies any loss of appetite.  She has hearing aids but does not wear them.  She had surgery for sleep apnea in 1997 and also uses a CPAP machine.  She reports trouble with her right shoulder and had 3 rotator cuff surgeries.  She has had some memory loss including forgetfulness.  She has been sleeping fairly well but does have a history of dream enactment behavior for the past 1+ year.  Within the past year she fell out of bed twice, she reports that she rolled out of bed and did not injure herself and went back to bed.  She takes melatonin for sleep.   She had a head CT and cervical spine CT without contrast after a fall on 11/17/2017 reviewed the results: IMPRESSION: Left frontoparietal scalp contusion.   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 No acute intracranial abnormality.   No acute cervical spine fracture or posttraumatic listhesis.     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 pylori infection 11/16/1993   Hiatal hernia    History of benign adrenal tumor 2007   [facility]   History of pneumonia 2007, 2008   Hyperlipidemia    Hypertension    IBS (irritable bowel syndrome)    Internal hemorrhoids    Kyphosis    Long term current use of antipsychotic medication 08/19/2022   Memory loss    Menopause    Metabolic syndrome    OSA on CPAP    Type 2 diabetes mellitus ([place])    Vitamin D deficiency     Her Past Surgical History Is Significant For: Past Surgical History:  Procedure Laterality Date   abdominal wall lematoma of fabdd hys.     APPENDECTOMY     CARPAL TUNNEL RELEASE     CESAREAN SECTION     X2   COLONOSCOPY     INCISIONAL HERNIA REPAIR     right breast biopsy  09/1995   ROTATOR CUFF REPAIR  2002   right   ROTATOR CUFF REPAIR/ bone spur  08/30/2009   SHOULDER ARTHROSCOPY WITH ROTATOR CUFF REPAIR AND SUBACROMIAL DECOMPRESSION Right 01/19/2018   Procedure: REVISION RIGHT MINI OPEN ROTATOR CUFF REPAIR AND SUBACROMIAL DECOMPRESSION;  Surgeon: Susa Day, MD;  Location: [facility];  Service: Orthopedics;  Laterality: Right;  90 MINS   sleep apnea surgery  04/1995   torn cartiledge rt knee x2  05/13/2006   Dr. Ronnie Derby [facility]    TOTAL ABDOMINAL HYSTERECTOMY W/ BILATERAL SALPINGOOPHORECTOMY  07/1993   fibroids     Her Family History Is Significant For: Family History  Problem Relation Age of Onset  Heart disease Mother    Parkinsonism Father    Emphysema Father    Hip fracture Father    Heart failure Sister     Ovarian cancer Sister    COPD Sister    Lymphoma Sister    COPD Sister    COPD Brother    Hypertension Brother    Heart disease Brother    COPD Brother    Colon cancer Neg Hx    Esophageal cancer Neg Hx    Pancreatic cancer Neg Hx    Stomach cancer Neg Hx    Breast cancer Neg Hx     Her Social History Is Significant For: Social History   Socioeconomic History   Marital status: Married    Spouse name: [name]   Number of children: 2   Years of education: some college   Highest education level: Some college, no degree  Occupational History   Occupation: retired    Comment: daycare  Tobacco Use   Smoking status: Never   Smokeless tobacco: Never  Vaping Use   Vaping Use: Never used  Substance and Sexual Activity   Alcohol use: No   Drug use: No   Sexual activity: Not on file  Other Topics Concern   Not on file  Social History Narrative   Mirai is retired. She lives with her husband [name]. They have two grown daughters that live locally. [place] enjoys reading and puzzles.       Social Determinants of Health   Financial Resource Strain: Low Risk  (04/22/2021)   Overall Financial Resource Strain (CARDIA)    Difficulty of Paying Living Expenses: Not hard at all  Food Insecurity: No Food Insecurity (04/22/2021)   Hunger Vital Sign    Worried About Running Out of Food in the Last Year: Never true    Ran Out of Food in the Last Year: Never true  Transportation Needs: No Transportation Needs (04/22/2021)   PRAPARE - Hydrologist (Medical): No    Lack of Transportation (Non-Medical): No  Physical Activity: Insufficiently Active (04/23/2022)   Exercise Vital Sign    Days of Exercise per Week: 7 days    Minutes of Exercise per Session: 20 min  Stress: No Stress Concern Present (04/23/2022)   [place]    Feeling of Stress : Only a little  Social Connections: Moderately  Isolated (04/22/2021)   Social Connection and Isolation Panel [NHANES]    Frequency of Communication with Friends and Family: More than three times a week    Frequency of Social Gatherings with Friends and Family: More than three times a week    Attends Religious Services: Never    Marine scientist or Organizations: No    Attends Music therapist: Never    Marital Status: Married    Her Allergies Are:  Allergies  Allergen Reactions   Statins Other (See Comments)    myalgias   E-Mycin [Erythromycin] Other (See Comments)    Medication intolerance stomach cramps   Estrogens Other (See Comments)    Estrogen replacement Therapy intolerance mood changes   Sulfa Antibiotics Other (See Comments)    Causes severe stomach cramps    Vioxx [Rofecoxib] Other (See Comments)    Unknown reaction/interaction. Possible stomach cramps  :   Her Current Medications Are:  Outpatient Encounter Medications as of 02/22/2023  Medication Sig   Ascorbic Acid (VITAMIN C) 1000 MG tablet Take 1,000 mg  by mouth daily.   aspirin EC 81 MG tablet Take 1 tablet (81 mg total) by mouth daily.   Calcium Carb-Cholecalciferol 600-800 MG-UNIT TABS Take 2 tablets by mouth daily.   Cholecalciferol (VITAMIN D-3) 5000 UNITS TABS Take 5,000 Units by mouth daily.   fenofibrate 160 MG tablet Take 1 tablet (160 mg total) by mouth daily.   irbesartan-hydrochlorothiazide (AVALIDE) 150-12.5 MG tablet TAKE 1 TABLET BY MOUTH DAILY   memantine (NAMENDA) 5 MG tablet Take 1 tablet (5 mg total) by mouth at bedtime for 30 days, THEN 1 tablet (5 mg total) 2 (two) times daily.   mirtazapine (REMERON) 45 MG tablet Take 1 tablet (45 mg total) by mouth at bedtime.   montelukast (SINGULAIR) 10 MG tablet TAKE 1 TABLET BY MOUTH DAILY   omeprazole (PRILOSEC) 40 MG capsule TAKE 1 CAPSULE BY MOUTH DAILY   PROAIR HFA 108 (90 BASE) MCG/ACT inhaler Use 2 puffs into the lungs  every 6 hours as needed for wheezing or shortness of   breath   fluticasone (FLONASE) 50 MCG/ACT nasal spray Use 2 sprays in each  nostril daily (Patient not taking: Reported on 02/22/2023)   Facility-Administered Encounter Medications as of 02/22/2023  Medication   0.9 %  sodium chloride infusion  :  Review of Systems:  Out of a complete 14 point review of systems, all are reviewed and negative with the exception of these symptoms as listed below:  Review of Systems  Neurological:        Patient is here with her husband for CPAP follow-up. They deny any issues with CPAP. ESS 0.     Objective:  Neurological Exam  Physical Exam Physical Examination:   Vitals:   02/22/23 1437  BP: 124/72  Pulse: 66    General Examination: The patient is a very pleasant 72 y.o. female in no acute distress. She appears well-developed and well-nourished and well groomed.   HEENT: Normocephalic, atraumatic, pupils are equal, round and reactive to light. Tracking is mildly impaired with saccadic breakdown of smooth pursuit.  She has mild facial masking.  She has mild nuchal rigidity.  She has a lower lip and jaw tremor, seems stable.  She has mild hypophonia. She is hard of hearing, no hearing aids in place.  Speech is otherwise scant, no dysarthria, airway examination reveals mild mouth dryness, status post UPPP, thicker tongue noted, tonsils absent.  Neck circumference of 19 inches.  No carotid bruits.  Mild orofacial dyskinesias noted.   Chest: Clear to auscultation without wheezing, rhonchi or crackles noted.   Heart: S1+S2+0, regular and normal without murmurs, rubs or gallops noted.    Abdomen: Soft, non-tender and non-distended.   Extremities: There is no pitting edema in the distal lower extremities bilaterally.   Skin: Warm and dry without trophic changes noted.   Musculoskeletal: exam reveals decreased mobility in her right shoulder.    Neurologically:  Mental status: The patient is awake, alert and oriented, history is primarily provided by  her husband.  She is hard of hearing. Mood is constricted and affect is blunted, stable.  She looks to her husband for answers to most questions. Cranial nerves II - XII are as described above under HEENT exam.  Motor exam: Normal bulk, tone is mildly increased in the upper extremities, no cogwheeling noted in the right upper extremity today.  She has no a minimal resting tremor in both upper extremities, mild resting tremor in both feet.  On fine motor testing, she has mild  impairment in both upper and lower extremities.  She has overall mild to moderate slowness in her movements, no dyskinesias.  Cerebellar testing: No dysmetria or intention tremor. There is no truncal or gait ataxia.  Sensory exam: intact to light touch in the upper and lower extremities. Gait, station and balance: She stands up with mild difficulty and pushes herself up.  Her posture is mildly stooped for age, she walks slowly and has decreased swing bilaterally.   Assessment and Plan:    In summary, Melissa Bowman is a very pleasant 72 year old female with an underlying medical history of vitamin D deficiency, hypertension, hyperlipidemia, irritable bowel syndrome, eczema, reflux disease, diverticulosis, diabetes, anxiety, depression, allergic rhinitis, adrenal tumor, sleep apnea, drug-induced parkinsonism, abnormal involuntary movements, and obesity, who presents for follow-up consultation of her obstructive sleep apnea.  She was originally diagnosed several years ago.  She has been on AutoPap therapy with a new machine since early 2022 after interim home sleep testing.  She has benefited from treatment and is fully compliant with it.  She is advised to follow-up routinely in sleep clinic in 1 year, I placed an order for new supplies through her DME company.   She is advised to stay well-hydrated and well rested.  As far as her gait disorder, cognitive complaints, changes in posture and fine motor dyscontrol, she has been stable,  she was recently taken off of Abilify and Lexapro and has started mirtazapine.  She is on memantine.  She is advised to follow-up with Frann Rider, NP for sleep apnea check in 1 year.  I answered all their questions today and the patient and her husband were in agreement. I spent 30 minutes in total face-to-face time and in reviewing records during pre-charting, more than 50% of which was spent in counseling and coordination of care, reviewing test results, reviewing medications and treatment regimen and/or in discussing or reviewing the diagnosis of OSA, drug-induced parkinsonism, the prognosis and treatment options. Pertinent laboratory and imaging test results that were available during this visit with the patient were reviewed by me and considered in my medical decision making (see chart for details).

## 2023-02-25 ENCOUNTER — Encounter: Payer: Self-pay | Admitting: Family Medicine

## 2023-02-26 ENCOUNTER — Telehealth: Payer: Self-pay | Admitting: Family Medicine

## 2023-02-26 ENCOUNTER — Telehealth: Payer: Medicare HMO | Admitting: Family Medicine

## 2023-02-26 DIAGNOSIS — U071 COVID-19: Secondary | ICD-10-CM

## 2023-02-26 MED ORDER — IRBESARTAN-HYDROCHLOROTHIAZIDE 150-12.5 MG PO TABS: 1.0000 | ORAL_TABLET | Freq: Every day | ORAL | 1 refills | Status: DC

## 2023-02-26 MED ORDER — NIRMATRELVIR/RITONAVIR (PAXLOVID) TABLET (RENAL DOSING): 2.0000 | ORAL_TABLET | Freq: Two times a day (BID) | ORAL | 0 refills | Status: AC

## 2023-02-26 MED ORDER — BENZONATATE 200 MG PO CAPS: 200.0000 mg | ORAL_CAPSULE | Freq: Three times a day (TID) | ORAL | 0 refills | Status: AC | PRN

## 2023-02-26 NOTE — Patient Instructions (Signed)
COVID-19 COVID-19 is an infection caused by a virus called SARS-CoV-2. Most people who get COVID-19 have mild to moderate symptoms. Some have little to no symptoms. In others, the virus may cause a severe infection. What are the causes? COVID-19 is caused by a coronavirus. The virus may be in the air as droplets or as tiny specks of fluid (aerosols). It may also be on surfaces. You may catch the virus if you: Breathe in droplets when a person with COVID-19 breathes, speaks, sings, coughs, or sneezes. Touch something that has the virus on it and then touch your mouth, nose, or eyes. What increases the risk? Risk for infection: You are more likely to get COVID-19 if: You are within 6 ft (1.8 m) of a person who has COVID-19 for 15 minutes or longer. You provide care to a person who has COVID-19. You are in close contact with others. This includes hugging, kissing, or sharing utensils. Risk for serious illness caused by COVID-19: You are more likely to get very ill from COVID-19 if: You have cancer. You have a long-term (chronic) disease. This may be: A chronic lung disease, such as pulmonary embolism, chronic obstructive pulmonary disease (COPD), or cystic fibrosis. A disease that affects your body's defense system (immune system). If you have a weak immune system, you are said to be immunocompromised. A serious heart condition, such as heart failure, coronary artery disease, or cardiomyopathy. Diabetes. Chronic kidney disease. A liver disease, such as cirrhosis, nonalcoholic fatty liver disease, alcoholic liver disease, or autoimmune hepatitis. You are obese. You are pregnant or were just pregnant. You have sickle cell disease. What are the signs or symptoms? Symptoms of COVID-19 can range from mild to severe. They may appear any time from 2 to 14 days after you are exposed. They include: Fever or chills. Shortness of breath or trouble breathing. Feeling tired. Headaches, body aches, or  muscle aches. A runny or stuffy nose. Sneezing, coughing, or a sore throat. New loss of taste or smell. You may also have stomach problems, such as nausea, vomiting, or diarrhea. In some cases, you may not have any symptoms. How is this diagnosed? COVID-19 may be diagnosed by testing a sample to check for the virus. The most common tests are the PCR test and the antigen test. Tests may be done in the lab or at home. They include: Using a swab to take a sample of fluid from your nose. Testing a sample of saliva from your mouth. Testing a sample of mucus from your lungs (sputum). How is this treated? Treatment for COVID-19 depends on how severe your condition is. Mild symptoms can be treated at home. You should rest, drink fluids, and take over-the-counter medicine. If you have symptoms and risk factors, you may be prescribed a medicine that fights viruses (antiviral). Severe symptoms may be treated in a hospital intensive care unit (ICU). Treatment may include: Extra oxygen given through a tube in the nose, a face mask, or a hood. Medicines. These may include: Antivirals, such as remdesivir. Anti-inflammatories, such as corticosteroids. These help reduce inflammation. Antithrombotics. These help prevent or treat blood clots. Convalescent plasma. This helps boost your immune system. Prone positioning. This is when you are laid on your stomach to help oxygen get into your lungs. Infection control measures. If you are at risk for a more serious illness, your health care provider may prescribe two medicines to help your immune system protect you. These are called long-acting monoclonal antibodies. They are given together   every 6 months. How is this prevented? To protect yourself: Get the vaccine or vaccine series if you meet the guidelines. You can even get the vaccine while you are pregnant or making breast milk (lactating). Get an added dose of the vaccine if you are immunocompromised. This  applies if you have had an organ transplant or if you have a condition that affects your immune system. You should get the added dose 4 weeks after you got the first one. If you get an mRNA vaccine, you will need to get 3 doses. Talk to your provider about getting experimental monoclonal antibodies. This treatment can help prevent severe illness. It may be given to you if: You are immunocompromised. You cannot get the vaccine. You may not get the vaccine if you have a severe allergic reaction to it or to what it is made of. You are not fully vaccinated. You are in a place where there is COVID-19 and: You are in close contact with someone who has COVID-19. You are at high risk of being exposed. You are at risk of illness from new variants of the virus. To protect others: If you have symptoms of COVID-19, take steps to stop the virus from spreading. Stay home. Leave your house only to get medical care. Do not use public transit. Do not travel while you are sick. Wash your hands often with soap and water for at least 20 seconds. If soap and water are not available, use alcohol-based hand sanitizer. Make sure that all people in your household wash their hands well and often. Cough or sneeze into a tissue or your sleeve or elbow. Do not cough or sneeze into your hand or into the air. Where to find more information Centers for Disease Control and Prevention (CDC): cdc.gov World Health Organization (WHO): who.int Get help right away if: You have trouble breathing. You have pain or pressure in your chest. You are confused. Your lips or fingernails turn blue. You have trouble waking from sleep. Your symptoms get worse. These symptoms may be an emergency. Get help right away. Call 911. Do not wait to see if the symptoms will go away. Do not drive yourself to the hospital. This information is not intended to replace advice given to you by your health care provider. Make sure you discuss any  questions you have with your health care provider. Document Revised: 08/13/2022 Document Reviewed: 08/13/2022 Elsevier Patient Education  2023 Elsevier Inc[employer]  

## 2023-02-26 NOTE — Progress Notes (Signed)
Virtual Visit Consent   KEYAWNA LENNEN, you are scheduled for a virtual visit with a [facility] provider today. Just as with appointments in the office, your consent must be obtained to participate. Your consent will be active for this visit and any virtual visit you may have with one of our providers in the next 365 days. If you have a MyChart account, a copy of this consent can be sent to you electronically.  As this is a virtual visit, video technology does not allow for your provider to perform a traditional examination. This may limit your provider's ability to fully assess your condition. If your provider identifies any concerns that need to be evaluated in person or the need to arrange testing (such as labs, EKG, etc.), we will make arrangements to do so. Although advances in technology are sophisticated, we cannot ensure that it will always work on either your end or our end. If the connection with a video visit is poor, the visit may have to be switched to a telephone visit. With either a video or telephone visit, we are not always able to ensure that we have a secure connection.  By engaging in this virtual visit, you consent to the provision of healthcare and authorize for your insurance to be billed (if applicable) for the services provided during this visit. Depending on your insurance coverage, you may receive a charge related to this service.  I need to obtain your verbal consent now. Are you willing to proceed with your visit today? SHAQUOIA COHAN has provided verbal consent on 02/26/2023 for a virtual visit (video or telephone). Georgana Curio, FNP  Date: 02/26/2023 4:45 PM  Virtual Visit via Video Note   I, Georgana Curio, connected with  TATEANA PIGNATELLI  ([id number], 08/24/1951) on 02/26/23 at  4:30 PM EDT by a video-enabled telemedicine application and verified that I am speaking with the correct person using two identifiers.  Location: Patient: Virtual Visit Location Patient:  Home Provider: Virtual Visit Location Provider: Home Office   I discussed the limitations of evaluation and management by telemedicine and the availability of in person appointments. The patient expressed understanding and agreed to proceed.    History of Present Illness: NATURELLE NEVIUS is a 72 y.o. who identifies as a female who was assigned female at birth, and is being seen today for positive covid testing. She has been sick for 3 weeks but worsened yesterday. Fever 101-102.cough, chills and body aches. She and husband are on telehealth requesting paxlovid. Marland Kitchen  HPI: HPI  Problems:  Patient Active Problem List   Diagnosis Date Noted   Mild cognitive impairment 12/28/2022   Hearing decreased, bilateral 09/22/2022   Mild cognitive disorder 09/10/2022   Prolonged Q-T interval on ECG 08/19/2022   Recurrent major depressive disorder, in partial remission (HCC) 08/19/2022   Insomnia 08/19/2022   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   Sleep apnea 12/24/2021   Heart murmur 12/24/2021   Hyperlipidemia associated with type 2 diabetes mellitus (HCC) 10/30/2019   Osteoarthritis of multiple joints 04/02/2019   Right rotator cuff tear 01/19/2018   Osteopenia after menopause 12/26/2013   Type 2 diabetes mellitus (HCC) 10/31/2013   Hypertension associated with diabetes (HCC) 05/14/2013   Allergic rhinitis 05/14/2013   Vitamin D deficiency 05/14/2013   Hemorrhage of rectum and anus 09/15/2011   Dysphagia, pharyngoesophageal phase 09/15/2011   Chronic RUQ pain 09/15/2011   Diverticulosis of colon (without mention of hemorrhage) 09/15/2011   BRBPR (bright red blood  per rectum) 08/27/2011   Internal hemorrhoids without mention of complication 08/27/2011   Esophageal reflux 08/27/2011   Abdominal pain 08/27/2011   Esophageal dysphagia 08/27/2011   Obesity 08/27/2011   Central sleep apnea due to medical condition 08/27/2011   Hematochezia 08/27/2011    Allergies:  Allergies   Allergen Reactions   Statins Other (See Comments)    myalgias   E-Mycin [Erythromycin] Other (See Comments)    Medication intolerance stomach cramps   Estrogens Other (See Comments)    Estrogen replacement Therapy intolerance mood changes   Sulfa Antibiotics Other (See Comments)    Causes severe stomach cramps    Vioxx [Rofecoxib] Other (See Comments)    Unknown reaction/interaction. Possible stomach cramps   Medications:  Current Outpatient Medications:    Ascorbic Acid (VITAMIN C) 1000 MG tablet, Take 1,000 mg by mouth daily., Disp: , Rfl:    aspirin EC 81 MG tablet, Take 1 tablet (81 mg total) by mouth daily., Disp: , Rfl:    Calcium Carb-Cholecalciferol 600-800 MG-UNIT TABS, Take 2 tablets by mouth daily., Disp: , Rfl:    Cholecalciferol (VITAMIN D-3) 5000 UNITS TABS, Take 5,000 Units by mouth daily., Disp: , Rfl:    fenofibrate 160 MG tablet, Take 1 tablet (160 mg total) by mouth daily., Disp: 90 tablet, Rfl: 1   fluticasone (FLONASE) 50 MCG/ACT nasal spray, Use 2 sprays in each  nostril daily (Patient not taking: Reported on 02/22/2023), Disp: 48 g, Rfl: 2   irbesartan-hydrochlorothiazide (AVALIDE) 150-12.5 MG tablet, Take 1 tablet by mouth daily., Disp: 100 tablet, Rfl: 1   memantine (NAMENDA) 5 MG tablet, Take 1 tablet (5 mg total) by mouth at bedtime for 30 days, THEN 1 tablet (5 mg total) 2 (two) times daily., Disp: 90 tablet, Rfl: 0   mirtazapine (REMERON) 45 MG tablet, Take 1 tablet (45 mg total) by mouth at bedtime., Disp: 90 tablet, Rfl: 1   montelukast (SINGULAIR) 10 MG tablet, TAKE 1 TABLET BY MOUTH DAILY, Disp: 100 tablet, Rfl: 2   omeprazole (PRILOSEC) 40 MG capsule, TAKE 1 CAPSULE BY MOUTH DAILY, Disp: 100 capsule, Rfl: 1   PROAIR HFA 108 (90 BASE) MCG/ACT inhaler, Use 2 puffs into the lungs  every 6 hours as needed for wheezing or shortness of  breath, Disp: 25.5 g, Rfl: 2  Current Facility-Administered Medications:    0.9 %  sodium chloride infusion, 500 mL,  Intravenous, Once, Jenel Lucks, MD  Observations/Objective: Patient is well-developed, well-nourished in no acute distress.  Resting comfortably  at home.  Head is normocephalic, atraumatic.  No labored breathing.  Speech is clear and coherent with logical content.  Patient is alert and oriented at baseline.    Assessment and Plan: 1. COVID  Increase fluids, humidifier at night, tylenol or ibuprofen as directed, UC if sx worsen. Quarantine discussed.   Follow Up Instructions: I discussed the assessment and treatment plan with the patient. The patient was provided an opportunity to ask questions and all were answered. The patient agreed with the plan and demonstrated an understanding of the instructions.  A copy of instructions were sent to the patient via MyChart unless otherwise noted below.     The patient was advised to call back or seek an in-person evaluation if the symptoms worsen or if the condition fails to improve as anticipated.  Time:  I spent 10 minutes with the patient via telehealth technology discussing the above problems/concerns.    Georgana Curio, FNP

## 2023-02-26 NOTE — Progress Notes (Signed)
Pt advised to schedule video visit for treatment of covid if antivirals are requested. DWB

## 2023-03-04 ENCOUNTER — Ambulatory Visit (INDEPENDENT_AMBULATORY_CARE_PROVIDER_SITE_OTHER): Payer: Medicare HMO

## 2023-03-04 ENCOUNTER — Ambulatory Visit (INDEPENDENT_AMBULATORY_CARE_PROVIDER_SITE_OTHER): Payer: Medicare HMO | Admitting: Nurse Practitioner

## 2023-03-04 ENCOUNTER — Encounter: Payer: Self-pay | Admitting: Nurse Practitioner

## 2023-03-04 VITALS — BP 130/83 | HR 63 | Temp 97.1°F | Resp 20 | Ht 60.0 in | Wt 159.0 lb

## 2023-03-04 DIAGNOSIS — R051 Acute cough: Secondary | ICD-10-CM

## 2023-03-04 DIAGNOSIS — R0989 Other specified symptoms and signs involving the circulatory and respiratory systems: Secondary | ICD-10-CM | POA: Diagnosis not present

## 2023-03-04 MED ORDER — AZITHROMYCIN 250 MG PO TABS: ORAL_TABLET | ORAL | 0 refills | Status: DC

## 2023-03-04 NOTE — Progress Notes (Signed)
Subjective:    Patient ID: Melissa Bowman, female    DOB: 11/30/1951, 72 y.o.   MRN: [id number]   Chief Complaint: Covid   HPI  Patient had covid on Saturday. She was given paxlovid and tesslaon perles. She is concerned because she is still running a fever of over 100 degrees. Cough is some better.   Patient Active Problem List   Diagnosis Date Noted   Mild cognitive impairment 12/28/2022   Hearing decreased, bilateral 09/22/2022   Mild cognitive disorder 09/10/2022   Prolonged Q-T interval on ECG 08/19/2022   Recurrent major depressive disorder, in partial remission (HCC) 08/19/2022   Insomnia 08/19/2022   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   Sleep apnea 12/24/2021   Heart murmur 12/24/2021   Hyperlipidemia associated with type 2 diabetes mellitus (HCC) 10/30/2019   Osteoarthritis of multiple joints 04/02/2019   Right rotator cuff tear 01/19/2018   Osteopenia after menopause 12/26/2013   Type 2 diabetes mellitus (HCC) 10/31/2013   Hypertension associated with diabetes (HCC) 05/14/2013   Allergic rhinitis 05/14/2013   Vitamin D deficiency 05/14/2013   Hemorrhage of rectum and anus 09/15/2011   Dysphagia, pharyngoesophageal phase 09/15/2011   Chronic RUQ pain 09/15/2011   Diverticulosis of colon (without mention of hemorrhage) 09/15/2011   BRBPR (bright red blood per rectum) 08/27/2011   Internal hemorrhoids without mention of complication 08/27/2011   Esophageal reflux 08/27/2011   Abdominal pain 08/27/2011   Esophageal dysphagia 08/27/2011   Obesity 08/27/2011   Central sleep apnea due to medical condition 08/27/2011   Hematochezia 08/27/2011       Review of Systems  Constitutional:  Positive for chills, fatigue and fever.  HENT:  Positive for congestion.   Respiratory:  Positive for cough. Negative for shortness of breath.   Neurological:  Negative for dizziness and headaches.       Objective:   Physical Exam Vitals reviewed.   Constitutional:      Appearance: Normal appearance.  HENT:     Right Ear: Tympanic membrane normal.     Left Ear: Tympanic membrane normal.     Nose: Congestion and rhinorrhea present.     Mouth/Throat:     Pharynx: No oropharyngeal exudate or posterior oropharyngeal erythema.  Cardiovascular:     Rate and Rhythm: Normal rate and regular rhythm.     Heart sounds: Normal heart sounds.  Pulmonary:     Effort: Pulmonary effort is normal.     Breath sounds: Normal breath sounds.  Musculoskeletal:     Cervical back: Normal range of motion and neck supple.  Skin:    General: Skin is warm.  Neurological:     General: No focal deficit present.     Mental Status: She is alert and oriented to person, place, and time.  Psychiatric:        Mood and Affect: Mood normal.        Behavior: Behavior normal.    BP 130/83   Pulse 63   Temp (!) 97.1 F (36.2 C) (Temporal)   Resp 20   Ht 5' (1.524 m)   Wt 159 lb (72.1 kg)   SpO2 97%   BMI 31.05 kg/m   Chest xray unchanged from previous-Preliminary reading by Paulene Floor, FNP  [facility]        Assessment & Plan:   [name] in today with chief complaint of Covid   1. Chest congestion - DG Chest 2 View  2. Acute cough 1. Take meds as prescribed  2. Use a cool mist humidifier especially during the winter months and when heat has been humid. 3. Use saline nose sprays frequently 4. Saline irrigations of the nose can be very helpful if done frequently.  * 4X daily for 1 week*  * Use of a nettie pot can be helpful with this. Follow directions with this* 5. Drink plenty of fluids 6. Keep thermostat turn down low 7.For any cough or congestion- mucinex OTC 8. For fever or aces or pains- take tylenol or ibuprofen appropriate for age and weight.  * for fevers greater than 101 orally you may alternate ibuprofen and tylenol every  3 hours.    Meds ordered this encounter  Medications   azithromycin (ZITHROMAX Z-PAK) 250 MG tablet     Sig: As directed    Dispense:  6 tablet    Refill:  0    Order Specific Question:   Supervising Provider    Answer:   Arville Care A [[id number]]      The above assessment and management plan was discussed with the patient. The patient verbalized understanding of and has agreed to the management plan. Patient is aware to call the clinic if symptoms persist or worsen. Patient is aware when to return to the clinic for a follow-up visit. Patient educated on when it is appropriate to go to the emergency department.   Mary-Margaret Daphine Deutscher, FNP

## 2023-03-04 NOTE — Patient Instructions (Signed)

## 2023-03-09 ENCOUNTER — Ambulatory Visit: Payer: Medicare HMO | Admitting: Family Medicine

## 2023-03-09 ENCOUNTER — Encounter: Payer: Self-pay | Admitting: Family Medicine

## 2023-03-09 VITALS — BP 109/66 | HR 60 | Temp 98.8°F | Ht 60.0 in | Wt 158.0 lb

## 2023-03-09 DIAGNOSIS — E785 Hyperlipidemia, unspecified: Secondary | ICD-10-CM | POA: Diagnosis not present

## 2023-03-09 DIAGNOSIS — T466X5A Adverse effect of antihyperlipidemic and antiarteriosclerotic drugs, initial encounter: Secondary | ICD-10-CM

## 2023-03-09 DIAGNOSIS — E1169 Type 2 diabetes mellitus with other specified complication: Secondary | ICD-10-CM | POA: Diagnosis not present

## 2023-03-09 DIAGNOSIS — M609 Myositis, unspecified: Secondary | ICD-10-CM | POA: Diagnosis not present

## 2023-03-09 DIAGNOSIS — E119 Type 2 diabetes mellitus without complications: Secondary | ICD-10-CM | POA: Diagnosis not present

## 2023-03-09 DIAGNOSIS — E1159 Type 2 diabetes mellitus with other circulatory complications: Secondary | ICD-10-CM | POA: Diagnosis not present

## 2023-03-09 DIAGNOSIS — I152 Hypertension secondary to endocrine disorders: Secondary | ICD-10-CM | POA: Diagnosis not present

## 2023-03-09 DIAGNOSIS — G3184 Mild cognitive impairment, so stated: Secondary | ICD-10-CM | POA: Diagnosis not present

## 2023-03-09 LAB — BAYER DCA HB A1C WAIVED: HB A1C (BAYER DCA - WAIVED): 5.3 % (ref 4.8–5.6)

## 2023-03-09 LAB — LIPID PANEL

## 2023-03-09 MED ORDER — MEMANTINE HCL 5 MG PO TABS: 5.0000 mg | ORAL_TABLET | Freq: Two times a day (BID) | ORAL | 3 refills | Status: DC

## 2023-03-09 NOTE — Progress Notes (Signed)
Subjective: CC:DM PCP: Janora Norlander, DO [id number] F Goni is a 72 y.o. female presenting to clinic today for:  1. Type 2 Diabetes with hypertension, hyperlipidemia:  Patient is doing really well.  Her diabetes has been diet controlled.  She is compliant with her Avalide, fenofibrate.  She has history of statin intolerance.  Last eye exam: Up-to-date Last foot exam: Needs Last A1c:  Lab Results  Component Value Date   HGBA1C 5.2 09/10/2022   Nephropathy screen indicated?:  Up-to-date Last flu, zoster and/or pneumovax:  Immunization History  Administered Date(s) Administered   Fluad Quad(high Dose 65+) 10/30/2019, 09/02/2020, 12/01/2021   Influenza, High Dose Seasonal PF 10/06/2018   Influenza,inj,Quad PF,6+ Mos 09/28/2013, 09/27/2014, 10/15/2015, 11/03/2016   Moderna Sars-Covid-2 Vaccination 02/26/2020, 03/25/2020, 11/04/2020   Pneumococcal Conjugate-13 05/25/2018   Pneumococcal Polysaccharide-23 10/30/2019   Tdap 09/27/2014   Zoster Recombinat (Shingrix) 02/07/2019, 08/03/2021   Zoster, Live 01/31/2013    ROS: Denies dizziness, LOC, polyuria, polydipsia, unintended weight loss/gain, foot ulcerations, numbness or tingling in extremities, shortness of breath or chest pain.  2.  [facility] Started on Namenda this year.  She is not having any difficulty with medication except that her insurance will approve the twice daily dosing until sometime in April.  She has been out of the medication for a week now.    ROS: Per HPI  Allergies  Allergen Reactions   Statins Other (See Comments)    myalgias   E-Mycin [Erythromycin] Other (See Comments)    Medication intolerance stomach cramps   Estrogens Other (See Comments)    Estrogen replacement Therapy intolerance mood changes   Sulfa Antibiotics Other (See Comments)    Causes severe stomach cramps    Vioxx [Rofecoxib] Other (See Comments)    Unknown reaction/interaction. Possible stomach cramps   Past Medical History:   Diagnosis Date   Allergic rhinitis    Allergy to ertapenem    Anxiety 08/27/2011   Anxiety and depression    Asthmatic bronchitis    Bilateral carpal tunnel syndrome    Condyloma acuminata    Diverticulosis    Eczema    GERD (gastroesophageal reflux disease)    H. pylori infection 11/16/1993   Hiatal hernia    History of benign adrenal tumor 2007   [facility]   History of pneumonia 2007, 2008   Hyperlipidemia    Hypertension    IBS (irritable bowel syndrome)    Internal hemorrhoids    Kyphosis    Long term current use of antipsychotic medication 08/19/2022   Memory loss    Menopause    Metabolic syndrome    OSA on CPAP    Type 2 diabetes mellitus (HCC)    Vitamin D deficiency     Current Outpatient Medications:    Ascorbic Acid (VITAMIN C) 1000 MG tablet, Take 1,000 mg by mouth daily., Disp: , Rfl:    aspirin EC 81 MG tablet, Take 1 tablet (81 mg total) by mouth daily., Disp: , Rfl:    azithromycin (ZITHROMAX Z-PAK) 250 MG tablet, As directed, Disp: 6 tablet, Rfl: 0   Calcium Carb-Cholecalciferol 600-800 MG-UNIT TABS, Take 2 tablets by mouth daily., Disp: , Rfl:    Cholecalciferol (VITAMIN D-3) 5000 UNITS TABS, Take 5,000 Units by mouth daily., Disp: , Rfl:    fenofibrate 160 MG tablet, Take 1 tablet (160 mg total) by mouth daily., Disp: 90 tablet, Rfl: 1   fluticasone (FLONASE) 50 MCG/ACT nasal spray, Use 2 sprays in each  nostril daily, Disp:  48 g, Rfl: 2   irbesartan-hydrochlorothiazide (AVALIDE) 150-12.5 MG tablet, Take 1 tablet by mouth daily., Disp: 100 tablet, Rfl: 1   mirtazapine (REMERON) 45 MG tablet, Take 1 tablet (45 mg total) by mouth at bedtime., Disp: 90 tablet, Rfl: 1   montelukast (SINGULAIR) 10 MG tablet, TAKE 1 TABLET BY MOUTH DAILY, Disp: 100 tablet, Rfl: 2   omeprazole (PRILOSEC) 40 MG capsule, TAKE 1 CAPSULE BY MOUTH DAILY, Disp: 100 capsule, Rfl: 1   PROAIR HFA 108 (90 BASE) MCG/ACT inhaler, Use 2 puffs into the lungs  every 6 hours as needed for wheezing or  shortness of  breath, Disp: 25.5 g, Rfl: 2   memantine (NAMENDA) 5 MG tablet, Take 1 tablet (5 mg total) by mouth 2 (two) times daily., Disp: 180 tablet, Rfl: 3  Current Facility-Administered Medications:    0.9 %  sodium chloride infusion, 500 mL, Intravenous, Once, Candis Schatz, Gladstone Pih, MD Social History   Socioeconomic History   Marital status: Married    Spouse name: [name]   Number of children: 2   Years of education: some college   Highest education level: Some college, no degree  Occupational History   Occupation: retired    Comment: daycare  Tobacco Use   Smoking status: Never   Smokeless tobacco: Never  Scientific laboratory technician Use: Never used  Substance and Sexual Activity   Alcohol use: No   Drug use: No   Sexual activity: Not on file  Other Topics Concern   Not on file  Social History Narrative   Jaquelyn is retired. She lives with her husband [name]. They have two grown daughters that live locally. Maudie enjoys reading and puzzles.       Social Determinants of Health   Financial Resource Strain: Low Risk  (04/22/2021)   Overall Financial Resource Strain (CARDIA)    Difficulty of Paying Living Expenses: Not hard at all  Food Insecurity: No Food Insecurity (04/22/2021)   Hunger Vital Sign    Worried About Running Out of Food in the Last Year: Never true    Ran Out of Food in the Last Year: Never true  Transportation Needs: No Transportation Needs (04/22/2021)   PRAPARE - Hydrologist (Medical): No    Lack of Transportation (Non-Medical): No  Physical Activity: Insufficiently Active (04/23/2022)   Exercise Vital Sign    Days of Exercise per Week: 7 days    Minutes of Exercise per Session: 20 min  Stress: No Stress Concern Present (04/23/2022)   [place]    Feeling of Stress : Only a little  Social Connections: Moderately Isolated (04/22/2021)   Social Connection and  Isolation Panel [NHANES]    Frequency of Communication with Friends and Family: More than three times a week    Frequency of Social Gatherings with Friends and Family: More than three times a week    Attends Religious Services: Never    Marine scientist or Organizations: No    Attends Archivist Meetings: Never    Marital Status: Married  Human resources officer Violence: Not At Risk (04/22/2021)   Humiliation, Afraid, Rape, and Kick questionnaire    Fear of Current or Ex-Partner: No    Emotionally Abused: No    Physically Abused: No    Sexually Abused: No   Family History  Problem Relation Age of Onset   Heart disease Mother    Parkinsonism Father  Emphysema Father    Hip fracture Father    Heart failure Sister    Ovarian cancer Sister    COPD Sister    Lymphoma Sister    COPD Sister    COPD Brother    Hypertension Brother    Heart disease Brother    COPD Brother    Colon cancer Neg Hx    Esophageal cancer Neg Hx    Pancreatic cancer Neg Hx    Stomach cancer Neg Hx    Breast cancer Neg Hx     Objective: Office vital signs reviewed. BP 109/66   Pulse 60   Temp 98.8 F (37.1 C)   Ht 5' (1.524 m)   Wt 158 lb (71.7 kg)   SpO2 98%   BMI 30.86 kg/m   Physical Examination:  General: Awake, alert, well nourished, No acute distress HEENT: Normal    Neck: No masses palpated. No lymphadenopathy    Ears: Tympanic membranes intact, normal light reflex, no erythema, no bulging    Eyes: PERRLA, extraocular membranes intact, sclera white    Nose: nasal turbinates moist, no nasal discharge    Throat: moist mucus membranes, no erythema, no tonsillar exudate.  Airway is patent Cardio: regular rate and rhythm, S1S2 heard, no murmurs appreciated Pulm: clear to auscultation bilaterally, no wheezes, rhonchi or rales; normal work of breathing on room air Neuro: see DM foot  Diabetic Foot Exam - Simple   Simple Foot Form Diabetic Foot exam was performed with the  following findings: Yes 03/09/2023 10:07 AM  Visual Inspection No deformities, no ulcerations, no other skin breakdown bilaterally: Yes Sensation Testing Intact to touch and monofilament testing bilaterally: Yes Pulse Check Posterior Tibialis and Dorsalis pulse intact bilaterally: Yes Comments      Assessment/ Plan: 72 y.o. female   Mild cognitive impairment - Plan: CMP14+EGFR, memantine (NAMENDA) 5 MG tablet  Diet-controlled diabetes mellitus (HCC) - Plan: Bayer DCA Hb A1c Waived  Hyperlipidemia associated with type 2 diabetes mellitus ([place]) - Plan: Lipid Panel, CMP14+EGFR  Statin-induced myositis  Hypertension associated with diabetes ([place]) - Plan: CMP14+EGFR  I have renewed her Namenda twice daily.  CMP collected as well as fasting lipid  Both sugar and blood pressure well-controlled.  No changes  Lipid collected.  Continue fenofibrate.  Statin induced myositis history so not on statin  Orders Placed This Encounter  Procedures   Bayer DCA Hb A1c Waived   Lipid Panel   CMP14+EGFR   Meds ordered this encounter  Medications   memantine (NAMENDA) 5 MG tablet    Sig: Take 1 tablet (5 mg total) by mouth 2 (two) times daily.    Dispense:  180 tablet    Refill:  [place], [place] [phone]

## 2023-03-10 LAB — CMP14+EGFR
ALT: 13 IU/L (ref 0–32)
AST: 14 IU/L (ref 0–40)
Albumin/Globulin Ratio: 2.2 (ref 1.2–2.2)
Albumin: 4.6 g/dL (ref 3.8–4.8)
Alkaline Phosphatase: 39 IU/L — ABNORMAL LOW (ref 44–121)
BUN/Creatinine Ratio: 37 — ABNORMAL HIGH (ref 12–28)
BUN: 33 mg/dL — ABNORMAL HIGH (ref 8–27)
Bilirubin Total: 0.3 mg/dL (ref 0.0–1.2)
CO2: 21 mmol/L (ref 20–29)
Calcium: 10.1 mg/dL (ref 8.7–10.3)
Chloride: 104 mmol/L (ref 96–106)
Creatinine, Ser: 0.89 mg/dL (ref 0.57–1.00)
Globulin, Total: 2.1 g/dL (ref 1.5–4.5)
Glucose: 108 mg/dL — ABNORMAL HIGH (ref 70–99)
Potassium: 4.1 mmol/L (ref 3.5–5.2)
Sodium: 143 mmol/L (ref 134–144)
Total Protein: 6.7 g/dL (ref 6.0–8.5)
eGFR: 69 mL/min/1.73

## 2023-03-10 LAB — LIPID PANEL
Chol/HDL Ratio: 5.6 ratio — ABNORMAL HIGH (ref 0.0–4.4)
Cholesterol, Total: 190 mg/dL (ref 100–199)
HDL: 34 mg/dL — ABNORMAL LOW (ref >39)
LDL Chol Calc (NIH): 116 mg/dL — ABNORMAL HIGH (ref 0–99)
Triglycerides: 226 mg/dL — ABNORMAL HIGH (ref 0–149)
VLDL Cholesterol Cal: 40 mg/dL (ref 5–40)

## 2023-03-21 DIAGNOSIS — G4733 Obstructive sleep apnea (adult) (pediatric): Secondary | ICD-10-CM | POA: Diagnosis not present

## 2023-04-04 ENCOUNTER — Encounter: Payer: Self-pay | Admitting: Family Medicine

## 2023-04-18 ENCOUNTER — Encounter: Payer: Self-pay | Admitting: Family Medicine

## 2023-04-18 ENCOUNTER — Ambulatory Visit (INDEPENDENT_AMBULATORY_CARE_PROVIDER_SITE_OTHER): Payer: Medicare HMO | Admitting: Family Medicine

## 2023-04-18 VITALS — BP 112/62 | HR 82 | Temp 98.7°F | Ht 60.0 in | Wt 158.0 lb

## 2023-04-18 DIAGNOSIS — J309 Allergic rhinitis, unspecified: Secondary | ICD-10-CM

## 2023-04-18 DIAGNOSIS — R0982 Postnasal drip: Secondary | ICD-10-CM

## 2023-04-18 MED ORDER — AZELASTINE HCL 0.1 % NA SOLN: 1.0000 | Freq: Two times a day (BID) | NASAL | 12 refills | Status: DC

## 2023-04-18 NOTE — Patient Instructions (Signed)
I've added Astelin nasal spray to help with the drainage.  This should dry it up. If you get stuffy, you can still use your flonase safely with this new nasal spray Continue Singulair Ok to use Claritin or Allegra if needed for breakthrough allergies.

## 2023-04-18 NOTE — Progress Notes (Signed)
Subjective: CC: Postnasal drainage PCP: [name], DO [id number] F Bouie is a 72 y.o. female presenting to clinic today for:  1.  Clearing of throat, postnasal drainage Patient reports several week history of postnasal drainage.  This is causing her to need to clear her throat frequently.  She sometimes uses her Flonase but is not currently taking any oral antihistamines.  Denies sore throat, fevers, hemoptysis.   ROS: Per HPI  Allergies  Allergen Reactions   Statins Other (See Comments)    myalgias   E-Mycin [Erythromycin] Other (See Comments)    Medication intolerance stomach cramps   Estrogens Other (See Comments)    Estrogen replacement Therapy intolerance mood changes   Sulfa Antibiotics Other (See Comments)    Causes severe stomach cramps    Vioxx [Rofecoxib] Other (See Comments)    Unknown reaction/interaction. 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 pylori infection 11/16/1993   Hiatal hernia    History of benign adrenal tumor 2007   [facility]   History of pneumonia 2007, 2008   Hyperlipidemia    Hypertension    IBS (irritable bowel syndrome)    Internal hemorrhoids    Kyphosis    Long term current use of antipsychotic medication 08/19/2022   Memory loss    Menopause    Metabolic syndrome    OSA on CPAP    Type 2 diabetes mellitus (HCC)    Vitamin D deficiency     Current Outpatient Medications:    Ascorbic Acid (VITAMIN C) 1000 MG tablet, Take 1,000 mg by mouth daily., Disp: , Rfl:    aspirin EC 81 MG tablet, Take 1 tablet (81 mg total) by mouth daily., Disp: , Rfl:    Calcium Carb-Cholecalciferol 600-800 MG-UNIT TABS, Take 2 tablets by mouth daily., Disp: , Rfl:     Cholecalciferol (VITAMIN D-3) 5000 UNITS TABS, Take 5,000 Units by mouth daily., Disp: , Rfl:    fenofibrate 160 MG tablet, Take 1 tablet (160 mg total) by mouth daily., Disp: 90 tablet, Rfl: 1   fluticasone (FLONASE) 50 MCG/ACT nasal spray, Use 2 sprays in each  nostril daily, Disp: 48 g, Rfl: 2   irbesartan-hydrochlorothiazide (AVALIDE) 150-12.5 MG tablet, Take 1 tablet by mouth daily., Disp: 100 tablet, Rfl: 1   memantine (NAMENDA) 5 MG tablet, Take 1 tablet (5 mg total) by mouth 2 (two) times daily., Disp: 180 tablet, Rfl: 3   mirtazapine (REMERON) 45 MG tablet, Take 1 tablet (45 mg total) by mouth at bedtime., Disp: 90 tablet, Rfl: 1   montelukast (SINGULAIR) 10 MG tablet, TAKE 1 TABLET BY MOUTH DAILY, Disp: 100 tablet, Rfl: 2   omeprazole (PRILOSEC) 40 MG capsule, TAKE 1 CAPSULE BY MOUTH DAILY, Disp: 100 capsule, Rfl: 1   PROAIR HFA 108 (90 BASE) MCG/ACT inhaler, Use 2 puffs into the lungs  every 6 hours as needed for wheezing or shortness of  breath, Disp: 25.5 g, Rfl: 2  Current Facility-Administered Medications:    0.9 %  sodium chloride infusion, 500 mL, Intravenous, Once, Melissa Lucks, MD Social History   Socioeconomic History   Marital status: Married    Spouse  name: [name]   Number of children: 2   Years of education: some college   Highest education level: Some college, no degree  Occupational History   Occupation: retired    Comment: daycare  Tobacco Use   Smoking status: Never   Smokeless tobacco: Never  Vaping Use   Vaping Use: Never used  Substance and Sexual Activity   Alcohol use: No   Drug use: No   Sexual activity: Not on file  Other Topics Concern   Not on file  Social History Narrative   Melissa Bowman is retired. She lives with her husband [name]. They have two grown daughters that live locally. Melissa Bowman enjoys reading and puzzles.       Social Determinants of Health   Financial Resource Strain: Low Risk  (04/22/2021)   Overall Financial Resource Strain  (CARDIA)    Difficulty of Paying Living Expenses: Not hard at all  Food Insecurity: No Food Insecurity (04/22/2021)   Hunger Vital Sign    Worried About Running Out of Food in the Last Year: Never true    Ran Out of Food in the Last Year: Never true  Transportation Needs: No Transportation Needs (04/22/2021)   PRAPARE - Administrator, Civil Service (Medical): No    Lack of Transportation (Non-Medical): No  Physical Activity: Insufficiently Active (04/23/2022)   Exercise Vital Sign    Days of Exercise per Week: 7 days    Minutes of Exercise per Session: 20 min  Stress: No Stress Concern Present (04/23/2022)   [employer] of Occupational Health - Occupational Stress Questionnaire    Feeling of Stress : Only a little  Social Connections: Moderately Isolated (04/22/2021)   Social Connection and Isolation Panel [NHANES]    Frequency of Communication with Friends and Family: More than three times a week    Frequency of Social Gatherings with Friends and Family: More than three times a week    Attends Religious Services: Never    Database administrator or Organizations: No    Attends Banker Meetings: Never    Marital Status: Married  Catering manager Violence: Not At Risk (04/22/2021)   Humiliation, Afraid, Rape, and Kick questionnaire    Fear of Current or Ex-Partner: No    Emotionally Abused: No    Physically Abused: No    Sexually Abused: No   Family History  Problem Relation Age of Onset   Heart disease Mother    Parkinsonism Father    Emphysema Father    Hip fracture Father    Heart failure Sister    Ovarian cancer Sister    COPD Sister    Lymphoma Sister    COPD Sister    COPD Brother    Hypertension Brother    Heart disease Brother    COPD Brother    Colon cancer Neg Hx    Esophageal cancer Neg Hx    Pancreatic cancer Neg Hx    Stomach cancer Neg Hx    Breast cancer Neg Hx     Objective: Office vital signs reviewed. BP 112/62   Pulse  82   Temp 98.7 F (37.1 C)   Ht 5' (1.524 m)   Wt 158 lb (71.7 kg)   SpO2 99%   BMI 30.86 kg/m   Physical Examination:  General: Awake, alert, well nourished, No acute distress HEENT: Hearing aids present bilaterally.  Oropharynx without masses but has mild cobblestone appearance.  No tonsils present.  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 female   Allergic rhinitis with postnasal drip - Plan: azelastine (ASTELIN) 0.1 % nasal spray  Start Astelin nasal spray.  Could consider addition of Allegra or Claritin if needed going forward.  We discussed use of Flonase if she becomes nasally congested but otherwise would focus on use of Astelin to dry up some of this drainage that sounds like it is causing a precipitated cough.  Pulmonary exam was unremarkable.  She may follow-up as needed this issue  No orders of the defined types were placed in this encounter.  No orders of the defined types were placed in this encounter.    [name], DO [facility] [phone]

## 2023-04-20 DIAGNOSIS — G4733 Obstructive sleep apnea (adult) (pediatric): Secondary | ICD-10-CM | POA: Diagnosis not present

## 2023-04-24 ENCOUNTER — Encounter: Payer: Self-pay | Admitting: Family Medicine

## 2023-04-25 ENCOUNTER — Ambulatory Visit (INDEPENDENT_AMBULATORY_CARE_PROVIDER_SITE_OTHER): Payer: Medicare HMO

## 2023-04-25 VITALS — Ht 60.0 in | Wt 158.0 lb

## 2023-04-25 DIAGNOSIS — Z0000 Encounter for general adult medical examination without abnormal findings: Secondary | ICD-10-CM | POA: Diagnosis not present

## 2023-04-25 DIAGNOSIS — Z1231 Encounter for screening mammogram for malignant neoplasm of breast: Secondary | ICD-10-CM

## 2023-04-25 MED ORDER — MONTELUKAST SODIUM 10 MG PO TABS: 10.0000 mg | ORAL_TABLET | Freq: Every day | ORAL | 3 refills | Status: DC

## 2023-04-25 NOTE — Patient Instructions (Signed)
Ms. Deshpande , Thank you for taking time to come for your Medicare Wellness Visit. I appreciate your ongoing commitment to your health goals. Please review the following plan we discussed and let me know if I can assist you in the future.   These are the goals we discussed:  Goals      DIET - INCREASE WATER INTAKE     Patient Stated     Start eating 3 balanced meals per day and continue daily walking for exercise.      Weight (lb) < 180 lb (81.6 kg)     Watch portion sizes and continue walking for exercise daily.         This is a list of the screening recommended for you and due dates:  Health Maintenance  Topic Date Due   Eye exam for diabetics  11/19/2022   COVID-19 Vaccine (4 - 2023-24 season) 05/11/2023*   Mammogram  10/21/2023*   Hepatitis C Screening: USPSTF Recommendation to screen - Ages 18-79 yo.  03/28/2024*   Flu Shot  07/14/2023   Hemoglobin A1C  09/09/2023   Yearly kidney health urinalysis for diabetes  11/16/2023   Yearly kidney function blood test for diabetes  03/08/2024   Complete foot exam   03/08/2024   Medicare Annual Wellness Visit  04/24/2024   DEXA scan (bone density measurement)  06/25/2024   DTaP/Tdap/Td vaccine (2 - Td or Tdap) 09/27/2024   Colon Cancer Screening  07/09/2028   Pneumonia Vaccine  Completed   Zoster (Shingles) Vaccine  Completed   HPV Vaccine  Aged Out  *Topic was postponed. The date shown is not the original due date.    Advanced directives: Advance directive discussed with you today. I have provided a copy for you to complete at home and have notarized. Once this is complete please bring a copy in to our office so we can scan it into your chart.   Conditions/risks identified: Aim for 30 minutes of exercise or brisk walking, 6-8 glasses of water, and 5 servings of fruits and vegetables each day.   Next appointment: Follow up in one year for your annual wellness visit    Preventive Care 65 Years and Older, Female Preventive care  refers to lifestyle choices and visits with your health care provider that can promote health and wellness. What does preventive care include? A yearly physical exam. This is also called an annual well check. Dental exams once or twice a year. Routine eye exams. Ask your health care provider how often you should have your eyes checked. Personal lifestyle choices, including: Daily care of your teeth and gums. Regular physical activity. Eating a healthy diet. Avoiding tobacco and drug use. Limiting alcohol use. Practicing safe sex. Taking low-dose aspirin every day. Taking vitamin and mineral supplements as recommended by your health care provider. What happens during an annual well check? The services and screenings done by your health care provider during your annual well check will depend on your age, overall health, lifestyle risk factors, and family history of disease. Counseling  Your health care provider may ask you questions about your: Alcohol use. Tobacco use. Drug use. Emotional well-being. Home and relationship well-being. Sexual activity. Eating habits. History of falls. Memory and ability to understand (cognition). Work and work Astronomer. Reproductive health. Screening  You may have the following tests or measurements: Height, weight, and BMI. Blood pressure. Lipid and cholesterol levels. These may be checked every 5 years, or more frequently if you are over 50  years old. Skin check. Lung cancer screening. You may have this screening every year starting at age 34 if you have a 30-pack-year history of smoking and currently smoke or have quit within the past 15 years. Fecal occult blood test (FOBT) of the stool. You may have this test every year starting at age 60. Flexible sigmoidoscopy or colonoscopy. You may have a sigmoidoscopy every 5 years or a colonoscopy every 10 years starting at age 44. Hepatitis C blood test. Hepatitis B blood test. Sexually transmitted  disease (STD) testing. Diabetes screening. This is done by checking your blood sugar (glucose) after you have not eaten for a while (fasting). You may have this done every 1-3 years. Bone density scan. This is done to screen for osteoporosis. You may have this done starting at age 42. Mammogram. This may be done every 1-2 years. Talk to your health care provider about how often you should have regular mammograms. Talk with your health care provider about your test results, treatment options, and if necessary, the need for more tests. Vaccines  Your health care provider may recommend certain vaccines, such as: Influenza vaccine. This is recommended every year. Tetanus, diphtheria, and acellular pertussis (Tdap, Td) vaccine. You may need a Td booster every 10 years. Zoster vaccine. You may need this after age 34. Pneumococcal 13-valent conjugate (PCV13) vaccine. One dose is recommended after age 55. Pneumococcal polysaccharide (PPSV23) vaccine. One dose is recommended after age 92. Talk to your health care provider about which screenings and vaccines you need and how often you need them. This information is not intended to replace advice given to you by your health care provider. Make sure you discuss any questions you have with your health care provider. Document Released: 12/26/2015 Document Revised: 08/18/2016 Document Reviewed: 09/30/2015 Elsevier Interactive Patient Education  2017 [employer] Prevention in the Home Falls can cause injuries. They can happen to people of all ages. There are many things you can do to make your home safe and to help prevent falls. What can I do on the outside of my home? Regularly fix the edges of walkways and driveways and fix any cracks. Remove anything that might make you trip as you walk through a door, such as a raised step or threshold. Trim any bushes or trees on the path to your home. Use bright outdoor lighting. Clear any walking paths of  anything that might make someone trip, such as rocks or tools. Regularly check to see if handrails are loose or broken. Make sure that both sides of any steps have handrails. Any raised decks and porches should have guardrails on the edges. Have any leaves, snow, or ice cleared regularly. Use sand or salt on walking paths during winter. Clean up any spills in your garage right away. This includes oil or grease spills. What can I do in the bathroom? Use night lights. Install grab bars by the toilet and in the tub and shower. Do not use towel bars as grab bars. Use non-skid mats or decals in the tub or shower. If you need to sit down in the shower, use a plastic, non-slip stool. Keep the floor dry. Clean up any water that spills on the floor as soon as it happens. Remove soap buildup in the tub or shower regularly. Attach bath mats securely with double-sided non-slip rug tape. Do not have throw rugs and other things on the floor that can make you trip. What can I do in the bedroom? Use  night lights. Make sure that you have a light by your bed that is easy to reach. Do not use any sheets or blankets that are too big for your bed. They should not hang down onto the floor. Have a firm chair that has side arms. You can use this for support while you get dressed. Do not have throw rugs and other things on the floor that can make you trip. What can I do in the kitchen? Clean up any spills right away. Avoid walking on wet floors. Keep items that you use a lot in easy-to-reach places. If you need to reach something above you, use a strong step stool that has a grab bar. Keep electrical cords out of the way. Do not use floor polish or wax that makes floors slippery. If you must use wax, use non-skid floor wax. Do not have throw rugs and other things on the floor that can make you trip. What can I do with my stairs? Do not leave any items on the stairs. Make sure that there are handrails on both  sides of the stairs and use them. Fix handrails that are broken or loose. Make sure that handrails are as long as the stairways. Check any carpeting to make sure that it is firmly attached to the stairs. Fix any carpet that is loose or worn. Avoid having throw rugs at the top or bottom of the stairs. If you do have throw rugs, attach them to the floor with carpet tape. Make sure that you have a light switch at the top of the stairs and the bottom of the stairs. If you do not have them, ask someone to add them for you. What else can I do to help prevent falls? Wear shoes that: Do not have high heels. Have rubber bottoms. Are comfortable and fit you well. Are closed at the toe. Do not wear sandals. If you use a stepladder: Make sure that it is fully opened. Do not climb a closed stepladder. Make sure that both sides of the stepladder are locked into place. Ask someone to hold it for you, if possible. Clearly mark and make sure that you can see: Any grab bars or handrails. First and last steps. Where the edge of each step is. Use tools that help you move around (mobility aids) if they are needed. These include: Canes. Walkers. Scooters. Crutches. Turn on the lights when you go into a dark area. Replace any light bulbs as soon as they burn out. Set up your furniture so you have a clear path. Avoid moving your furniture around. If any of your floors are uneven, fix them. If there are any pets around you, be aware of where they are. Review your medicines with your doctor. Some medicines can make you feel dizzy. This can increase your chance of falling. Ask your doctor what other things that you can do to help prevent falls. This information is not intended to replace advice given to you by your health care provider. Make sure you discuss any questions you have with your health care provider. Document Released: 09/25/2009 Document Revised: 05/06/2016 Document Reviewed: 01/03/2015 Elsevier  Interactive Patient Education  2017 [employer].

## 2023-04-25 NOTE — Progress Notes (Signed)
Subjective:   Melissa Bowman is a 72 y.o. female who presents for Medicare Annual (Subsequent) preventive examination. I connected with  Melissa Bowman on 04/25/23 by a audio enabled telemedicine application and verified that I am speaking with the correct person using two identifiers.  Patient Location: Home  Provider Location: Home Office  I discussed the limitations of evaluation and management by telemedicine. The patient expressed understanding and agreed to proceed.  Review of Systems     Cardiac Risk Factors include: advanced age (>27men, >66 women);hypertension;dyslipidemia     Objective:    Today's Vitals   04/25/23 1541  Weight: 158 lb (71.7 kg)  Height: 5' (1.524 m)   Body mass index is 30.86 kg/m.     04/25/2023    3:45 PM 04/23/2022    4:22 PM 04/22/2021    1:30 PM 03/04/2020    8:32 AM 02/07/2019   10:38 AM 01/19/2018    6:11 AM 01/12/2018    1:26 PM  Advanced Directives  Does Patient Have a Medical Advance Directive? No No No No No No No  Would patient like information on creating a medical advance directive? No - Patient declined No - Patient declined No - Patient declined No - Patient declined Yes (MAU/Ambulatory/Procedural Areas - Information given) No - Patient declined No - Patient declined    Current Medications (verified) Outpatient Encounter Medications as of 04/25/2023  Medication Sig   Ascorbic Acid (VITAMIN C) 1000 MG tablet Take 1,000 mg by mouth daily.   aspirin EC 81 MG tablet Take 1 tablet (81 mg total) by mouth daily.   azelastine (ASTELIN) 0.1 % nasal spray Place 1 spray into both nostrils 2 (two) times daily.   Calcium Carb-Cholecalciferol 600-800 MG-UNIT TABS Take 2 tablets by mouth daily.   Cholecalciferol (VITAMIN D-3) 5000 UNITS TABS Take 5,000 Units by mouth daily.   fenofibrate 160 MG tablet Take 1 tablet (160 mg total) by mouth daily.   fluticasone (FLONASE) 50 MCG/ACT nasal spray Use 2 sprays in each  nostril daily    irbesartan-hydrochlorothiazide (AVALIDE) 150-12.5 MG tablet Take 1 tablet by mouth daily.   memantine (NAMENDA) 5 MG tablet Take 1 tablet (5 mg total) by mouth 2 (two) times daily.   mirtazapine (REMERON) 45 MG tablet Take 1 tablet (45 mg total) by mouth at bedtime.   montelukast (SINGULAIR) 10 MG tablet Take 1 tablet (10 mg total) by mouth daily.   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 pylori infection 11/16/1993   Hiatal hernia    History of benign adrenal tumor 2007   [facility]   History of pneumonia 2007, 2008   Hyperlipidemia    Hypertension    IBS (irritable bowel syndrome)    Internal  hemorrhoids    Kyphosis    Long term current use of antipsychotic medication 08/19/2022   Memory loss    Menopause    Metabolic syndrome    OSA on CPAP    Type 2 diabetes mellitus (HCC)    Vitamin D deficiency    Past Surgical History:  Procedure Laterality Date   abdominal wall lematoma of fabdd hys.     APPENDECTOMY     CARPAL TUNNEL RELEASE     CESAREAN SECTION     X2   COLONOSCOPY     INCISIONAL HERNIA REPAIR     right breast biopsy  09/1995   ROTATOR CUFF REPAIR  2002   right   ROTATOR CUFF REPAIR/ bone spur  08/30/2009   SHOULDER ARTHROSCOPY WITH ROTATOR CUFF REPAIR AND SUBACROMIAL DECOMPRESSION Right  01/19/2018   Procedure: REVISION RIGHT MINI OPEN ROTATOR CUFF REPAIR AND SUBACROMIAL DECOMPRESSION;  Surgeon: Jene Every, MD;  Location: [facility];  Service: Orthopedics;  Laterality: Right;  90 MINS   sleep apnea surgery  04/1995   torn cartiledge rt knee x2  05/13/2006   Dr. Sherlean Foot Smoc    TOTAL ABDOMINAL HYSTERECTOMY W/ BILATERAL SALPINGOOPHORECTOMY  07/1993   fibroids    Family History  Problem Relation Age of Onset   Heart disease Mother    Parkinsonism Father    Emphysema Father    Hip fracture Father    Heart failure Sister    Ovarian cancer Sister    COPD Sister    Lymphoma Sister    COPD Sister    COPD Brother    Hypertension Brother    Heart disease Brother    COPD Brother    Colon cancer Neg Hx    Esophageal cancer Neg Hx    Pancreatic cancer Neg Hx    Stomach cancer Neg Hx    Breast cancer Neg Hx    Social History   Socioeconomic History   Marital status: Married    Spouse name: Melissa Bowman   Number of children: 2   Years of education: some college   Highest education level: Some college, no degree  Occupational History   Occupation: retired    Comment: daycare  Tobacco Use   Smoking status: Never   Smokeless tobacco: Never  Vaping Use   Vaping Use: Never used  Substance and Sexual Activity   Alcohol use: No   Drug use: No   Sexual activity: Not on file  Other Topics Concern   Not on file  Social History Narrative   Melissa Bowman is retired. She lives with her husband Melissa Bowman. They have two grown daughters that live locally. Melissa Bowman enjoys reading and puzzles.       Social Determinants of Health   Financial Resource Strain: Low Risk  (04/25/2023)   Overall Financial Resource Strain (CARDIA)    Difficulty of Paying Living Expenses: Not hard at all  Food Insecurity: No Food Insecurity (04/25/2023)   Hunger Vital Sign    Worried About Running Out of Food in the Last Year: Never true    Ran Out of Food in the Last Year: Never true  Transportation Needs: No  Transportation Needs (04/25/2023)   PRAPARE - Administrator, Civil Service (Medical): No    Lack of Transportation (Non-Medical): No  Physical Activity: Insufficiently Active (04/25/2023)   Exercise Vital Sign    Days of Exercise per Week: 3 days    Minutes of Exercise per Session: 30 min  Stress: No Stress  Concern Present (04/25/2023)   [employer] of Occupational Health - Occupational Stress Questionnaire    Feeling of Stress : Not at all  Social Connections: Moderately Isolated (04/25/2023)   Social Connection and Isolation Panel [NHANES]    Frequency of Communication with Friends and Family: Patient unable to answer    Frequency of Social Gatherings with Friends and Family: Patient unable to answer    Attends Religious Services: Never    Database administrator or Organizations: No    Attends Engineer, structural: Never    Marital Status: Married    Tobacco Counseling Counseling given: Not Answered   Clinical Intake:  Pre-visit preparation completed: Yes  Pain : No/denies pain     Nutritional Risks: None Diabetes: No  How often do you need to have someone help you when you read instructions, pamphlets, or other written materials from your doctor or pharmacy?: 1 - Never  Diabetic?no   Interpreter Needed?: No  Information entered by :: Renie Ora, LPN   Activities of Daily Living    04/25/2023    3:45 PM  In your present state of health, do you have any difficulty performing the following activities:  Hearing? 0  Vision? 0  Difficulty concentrating or making decisions? 0  Walking or climbing stairs? 0  Dressing or bathing? 0  Doing errands, shopping? 0  Preparing Food and eating ? N  Using the Toilet? N  In the past six months, have you accidently leaked urine? N  Do you have problems with loss of bowel control? N  Managing your Medications? N  Managing your Finances? N  Housekeeping or managing your Housekeeping? N    Patient  Care Team: [name], DO as PCP - General (Family Medicine) Jonelle Sidle, MD as PCP - Cardiology (Cardiology) Jene Every, MD as Consulting Physician (Orthopedic Surgery) Elsie Lincoln, MD as Consulting Physician (Psychiatry)  Indicate any recent Medical Services you may have received from other than [facility] providers in the past year (date may be approximate).     Assessment:   This is a routine wellness examination for Miche.  Hearing/Vision screen Vision Screening - Comments:: Wears rx glasses - up to date with routine eye exams with  Dr.Johnson   Dietary issues and exercise activities discussed:     Goals Addressed             This Visit's Progress    DIET - INCREASE WATER INTAKE         Depression Screen    04/25/2023    3:44 PM 04/18/2023    9:54 AM 03/09/2023    9:25 AM 02/01/2023   11:10 AM 09/10/2022    3:21 PM 08/19/2022   11:12 AM 08/03/2022    1:00 PM  PHQ 2/9 Scores  PHQ - 2 Score 0 0 0 0 0  0  PHQ- 9 Score 0 0 0 0 0  3     Information is confidential and restricted. Go to Review Flowsheets to unlock data.    Fall Risk    04/25/2023    3:43 PM 04/18/2023    9:54 AM 02/01/2023   11:10 AM 09/10/2022    3:21 PM 08/03/2022    1:00 PM  Fall Risk   Falls in the past year? 0 0 0 0 0  Number falls in past yr: 0 0     Injury with Fall? 0 0     Risk for fall due to : No  Fall Risks No Fall Risks     Follow up Falls prevention discussed Education provided       FALL RISK PREVENTION PERTAINING TO THE HOME:  Any stairs in or around the home? Yes  If so, are there any without handrails? No  Home free of loose throw rugs in walkways, pet beds, electrical cords, etc? Yes  Adequate lighting in your home to reduce risk of falls? Yes   ASSISTIVE DEVICES UTILIZED TO PREVENT FALLS:  Life alert? No  Use of a cane, walker or w/c? No  Grab bars in the bathroom? No  Shower chair or bench in shower? No  Elevated toilet seat or a handicapped toilet? No        02/07/2019    2:03 PM  MMSE - Mini Mental State Exam  Orientation to time 5  Orientation to Place 5  Registration 3  Attention/ Calculation 5  Recall 3  Language- name 2 objects 2  Language- repeat 1  Language- follow 3 step command 3  Language- read & follow direction 1  Write a sentence 1  Copy design 1  Total score 30        04/25/2023    3:45 PM 04/23/2022    4:15 PM 03/04/2020    8:36 AM  6CIT Screen  What Year? 0 points 0 points 0 points  What month? 0 points 0 points 0 points  What time? 0 points 0 points 0 points  Count back from 20 0 points 0 points 0 points  Months in reverse 0 points 0 points 0 points  Repeat phrase 0 points 2 points 0 points  Total Score 0 points 2 points 0 points    Immunizations Immunization History  Administered Date(s) Administered   Fluad Quad(high Dose 65+) 10/30/2019, 09/02/2020, 12/01/2021   Influenza, High Dose Seasonal PF 10/06/2018   Influenza,inj,Quad PF,6+ Mos 09/28/2013, 09/27/2014, 10/15/2015, 11/03/2016   Moderna Sars-Covid-2 Vaccination 02/26/2020, 03/25/2020, 11/04/2020   Pneumococcal Conjugate-13 05/25/2018   Pneumococcal Polysaccharide-23 10/30/2019   Tdap 09/27/2014   Zoster Recombinat (Shingrix) 02/07/2019, 08/03/2021   Zoster, Live 01/31/2013    TDAP status: Up to date  Flu Vaccine status: Up to date  Pneumococcal vaccine status: Up to date  Covid-19 vaccine status: Completed vaccines  Qualifies for Shingles Vaccine? Yes   Zostavax completed Yes   Shingrix Completed?: Yes  Screening Tests Health Maintenance  Topic Date Due   OPHTHALMOLOGY EXAM  11/19/2022   COVID-19 Vaccine (4 - 2023-24 season) 05/11/2023 (Originally 08/13/2022)   MAMMOGRAM  10/21/2023 (Originally 08/12/2022)   Hepatitis C Screening  03/28/2024 (Originally 09/23/1969)   INFLUENZA VACCINE  07/14/2023   HEMOGLOBIN A1C  09/09/2023   Diabetic kidney evaluation - Urine ACR  11/16/2023   Diabetic kidney evaluation - eGFR measurement   03/08/2024   FOOT EXAM  03/08/2024   Medicare Annual Wellness (AWV)  04/24/2024   DEXA SCAN  06/25/2024   DTaP/Tdap/Td (2 - Td or Tdap) 09/27/2024   COLONOSCOPY (Pts 45-4yrs Insurance coverage will need to be confirmed)  07/09/2028   Pneumonia Vaccine 77+ Years old  Completed   Zoster Vaccines- Shingrix  Completed   HPV VACCINES  Aged Out    Health Maintenance  Health Maintenance Due  Topic Date Due   OPHTHALMOLOGY EXAM  11/19/2022    Colorectal cancer screening: Type of screening: Colonoscopy. Completed 07/09/2021. Repeat every 7 years  Mammogram status: Ordered 04/25/2023. Pt provided with contact info and advised to call to schedule appt.   Bone  Density status: Completed 06/25/2022. Results reflect: Bone density results: OSTEOPOROSIS. Repeat every 2 years.  Lung Cancer Screening: (Low Dose CT Chest recommended if Age 50-80 years, 30 pack-year currently smoking OR have quit w/in 15years.) does not qualify.   Lung Cancer Screening Referral: n/a  Additional Screening:  Hepatitis C Screening: does qualify;   Vision Screening: Recommended annual ophthalmology exams for early detection of glaucoma and other disorders of the eye. Is the patient up to date with their annual eye exam?  Yes  Who is the provider or what is the name of the office in which the patient attends annual eye exams? Dr.Lee  If pt is not established with a provider, would they like to be referred to a provider to establish care? No .   Dental Screening: Recommended annual dental exams for proper oral hygiene  Community Resource Referral / Chronic Care Management: CRR required this visit?  No   CCM required this visit?  No      Plan:     I have personally reviewed and noted the following in the patient's chart:   Medical and social history Use of alcohol, tobacco or illicit drugs  Current medications and supplements including opioid prescriptions. Patient is not currently taking opioid  prescriptions. Functional ability and status Nutritional status Physical activity Advanced directives List of other physicians Hospitalizations, surgeries, and ER visits in previous 12 months Vitals Screenings to include cognitive, depression, and falls Referrals and appointments  In addition, I have reviewed and discussed with patient certain preventive protocols, quality metrics, and best practice recommendations. A written personalized care plan for preventive services as well as general preventive health recommendations were provided to patient.     Lorrene Reid, LPN   1/61/0960   Nurse Notes: none

## 2023-05-01 ENCOUNTER — Encounter (HOSPITAL_COMMUNITY): Payer: Self-pay

## 2023-05-08 ENCOUNTER — Encounter (HOSPITAL_COMMUNITY): Payer: Self-pay

## 2023-05-12 ENCOUNTER — Encounter (HOSPITAL_COMMUNITY): Payer: Self-pay | Admitting: Psychiatry

## 2023-05-12 ENCOUNTER — Telehealth (INDEPENDENT_AMBULATORY_CARE_PROVIDER_SITE_OTHER): Payer: Medicare HMO | Admitting: Psychiatry

## 2023-05-12 DIAGNOSIS — T43505A Adverse effect of unspecified antipsychotics and neuroleptics, initial encounter: Secondary | ICD-10-CM

## 2023-05-12 DIAGNOSIS — F330 Major depressive disorder, recurrent, mild: Secondary | ICD-10-CM

## 2023-05-12 DIAGNOSIS — G4700 Insomnia, unspecified: Secondary | ICD-10-CM | POA: Diagnosis not present

## 2023-05-12 DIAGNOSIS — G4737 Central sleep apnea in conditions classified elsewhere: Secondary | ICD-10-CM | POA: Diagnosis not present

## 2023-05-12 DIAGNOSIS — G3184 Mild cognitive impairment, so stated: Secondary | ICD-10-CM | POA: Diagnosis not present

## 2023-05-12 DIAGNOSIS — G2401 Drug induced subacute dyskinesia: Secondary | ICD-10-CM

## 2023-05-12 MED ORDER — MIRTAZAPINE 45 MG PO TABS: 45.0000 mg | ORAL_TABLET | Freq: Every day | ORAL | 1 refills | Status: DC

## 2023-05-12 MED ORDER — SERTRALINE HCL 50 MG PO TABS
50.0000 mg | ORAL_TABLET | Freq: Every day | ORAL | 2 refills | Status: DC
Start: 2023-05-12 — End: 2024-02-07

## 2023-05-12 NOTE — Patient Instructions (Signed)
We added Zoloft 50 mg once daily to her regimen today.  This will take the place of the Lexapro to further assist with the irritability and depressed mood.  This will also be safer than the Lexapro and should not prolong the QTc interval.  Keep up the good work with taking your memantine (the memory medicine) and Remeron (your sleeping pill).

## 2023-05-12 NOTE — Progress Notes (Signed)
BH MD Outpatient Progress Note  05/12/2023 2:45 PM Melissa Bowman  MRN:  [id number]  Assessment:  Melissa Bowman presents for follow-up evaluation. Today, 05/12/23, patient reports slight worsening of mood since getting her hearing aids in and being bothered by the grandchildren being loud and having disruptions in her thought process from random noises.  Patient and husband were amenable to trial of Zoloft as she did benefit from Lexapro but this will avoid the QTc prolongation that the latter did.  Her responses have greatly improved with having the hearing aid and she was the most part participatory in exam to date; this will likely help preserve her cognition along with addition of memantine and she has been consistently taking for the last 3 months.  Unfortunately neurology referral confirmed tardive dyskinesia diagnosis but did not want to start VMAT 2 inhibitor. Follow up in 2 months.  For safety, her acute risk factors for suicide are: Current diagnosis of depression, cognitive impairment.  Her chronic risk factors for suicide are: Retired, chronic mental illness.  Her protective factors are: Supportive family and friends, beloved pets, grandchildren that are frequently in the home, actively seeking and engaging with mental health care, no suicidal ideation.  Future events cannot be fully predicted she does not currently meet IVC criteria and can be continued as an outpatient.  Identifying Information: Melissa Bowman is a 72 y.o. female with a history of mild neurocognitive disorder, major depressive disorder, anxiety, insomnia, neuroleptic tardive dyskinesia, bradycardia with prolonged Qtc, and central sleep apnea  who is an established patient with [facility] participating in follow-up via video conferencing. Initial evaluation on 08/19/22, see that note for full case formulation. Amitriptyline discontinued due to possible drug interaction with her heart. Her CPAP was  adjusted in March by her provider and her husband fine tunes at home. Trials of belsombra and ramelteon proved ineffective. Mirtazapine was next medication trial. Discontinued patient's abilify as her depression appears to be in remission outside of the symptoms of insomnia and some decreased appetite. She was noted to have neuroleptic induced tardive dyskinesia. Also had Qtc prolongation along with sinus bradycardia on independent review of ecg from 06/2022 which was another reason to discontinue the abilify as outlined in plan. Had difficulty hearing vs some delay from abilify use vs early stages of a neurocognitive disorder. Increased concern for the latter due to patient's age, prolonged use of amitriptyline which has a higher anticholinergic load, and frequent checking with husband for answers to some questions during interview. MOCA done with PCPs office after that visit showing MoCA score of 24 on recent examination on 09/10/2022 and patient was prescribed memantine but never picked it up from the pharmacy as of 12/28/2022.    Plan:   # Mild neurocognitive disorder  diminished hearing Past medication trials: memantine, hearing aids Status of problem: Improving Interventions: -- Continue memantine 5mg  bid (s1/16/24, i2/23/24) -- patient to set up hearing evaluation with ENT  # Insomnia  Central sleep apnea Past medication trials: amitriptyline (effective), belsombra, ramelteon Status of problem: improving Interventions: -- continue mirtazapine 45mg  once nightly (i10/11/23, i11/10/23, i1/16/24) -- CMP is up to date with normal sodium   # Major depressive disorder, recurrent, mild Past medication trials: abilify, lexapro, amitriptyline. 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   Subjective:  Chief Complaint:  Chief Complaint  Patient presents with   Depression   Follow-up   Neuroleptic-induced tardive dyskinesia   Memory Loss    Interval History: Melissa Bowman doing fine today.  Has been getting sad on some days without knowing why and getting irritable. Doesn't know who she gets fussing at, anything that interrupts her train of thought of kids being loud. Hearing aids are helping but she can hear a lot easier and understands this provider for the first time. Melissa Bowman notes that she has had less motivation. Her daughter just got put on lexapro and it has been working well. Melissa Bowman notes that effexor didn't do too well for her. She is sleeping well still on CPAP but she is still dealing with involuntary facial movements that are roughly the same as prior. Still with movement. She has to clear her throat a lot from a recent cold. They met with the neurologist but didn't want to start any medication. Will need to switch medication to [facility] [place]. Still taking memantine and Melissa Bowman says her memory is better than his now. Taking twice per day. Not noting any side effects at present.  Visit Diagnosis:    ICD-10-CM   1. Mild cognitive impairment  G31.84     2. Mild episode of recurrent major depressive disorder (HCC)  F33.0 mirtazapine (REMERON) 45 MG tablet    sertraline (ZOLOFT) 50 MG tablet    3. Insomnia, unspecified type  G47.00 mirtazapine (REMERON) 45 MG tablet    4. Central sleep apnea due to medical condition  G47.37     5. Neuroleptic-induced tardive dyskinesia  G24.01    T43.505A       Past Psychiatric History:   Diagnoses: mild neurocognitive disorder, major depressive disorder, anxiety, insomnia, neuroleptic tardive dyskinesia Medication trials: abilify, lexapro, amitriptyline, belsombra, melatonin, remeron (currently effective). Others she cannot remember Previous psychiatrist/therapist: biofeedback many years ago Hospitalizations: none Suicide attempts: none SIB: none Hx of violence towards others: none Current access to guns: will assess at future visit Hx of abuse: none Substance use: none  Past Medical History:  Past Medical History:  Diagnosis Date   Allergic rhinitis    Allergy to ertapenem    Anxiety 08/27/2011   Anxiety and depression    Asthmatic bronchitis    Bilateral carpal tunnel syndrome    Condyloma acuminata    Diverticulosis    Eczema    GERD (gastroesophageal reflux disease)    H. pylori infection 11/16/1993   Hiatal hernia    History of benign adrenal tumor 2007   [facility]   History of pneumonia 2007, 2008   Hyperlipidemia    Hypertension    IBS (irritable bowel syndrome)    Internal hemorrhoids    Kyphosis    Long term current use of antipsychotic medication 08/19/2022   Memory loss    Menopause    Metabolic syndrome    OSA on CPAP    Prolonged Q-T interval on ECG 08/19/2022   Type 2 diabetes mellitus (HCC)    Vitamin D deficiency     Past Surgical History:  Procedure Laterality Date   abdominal wall lematoma of fabdd hys.  APPENDECTOMY     CARPAL TUNNEL RELEASE     CESAREAN SECTION     X2   COLONOSCOPY     INCISIONAL HERNIA REPAIR     right breast biopsy  09/1995   ROTATOR CUFF REPAIR  2002   right   ROTATOR CUFF REPAIR/ bone spur  08/30/2009   SHOULDER ARTHROSCOPY WITH ROTATOR CUFF REPAIR AND SUBACROMIAL DECOMPRESSION Right 01/19/2018   Procedure: REVISION RIGHT MINI OPEN ROTATOR CUFF REPAIR AND SUBACROMIAL DECOMPRESSION;  Surgeon: Jene Every, MD;  Location: [facility];  Service: Orthopedics;  Laterality: Right;  90 MINS   sleep apnea surgery   04/1995   torn cartiledge rt knee x2  05/13/2006   Dr. Sherlean Foot Smoc    TOTAL ABDOMINAL HYSTERECTOMY W/ BILATERAL SALPINGOOPHORECTOMY  07/1993   fibroids     Family Psychiatric History: both daughters with depression  Family History:  Family History  Problem Relation Age of Onset   Heart disease Mother    Parkinsonism Father    Emphysema Father    Hip fracture Father    Heart failure Sister    Ovarian cancer Sister    COPD Sister    Lymphoma Sister    COPD Sister    COPD Brother    Hypertension Brother    Heart disease Brother    COPD Brother    Colon cancer Neg Hx    Esophageal cancer Neg Hx    Pancreatic cancer Neg Hx    Stomach cancer Neg Hx    Breast cancer Neg Hx     Social History:  Social History   Socioeconomic History   Marital status: Married    Spouse name: Melissa Bowman   Number of children: 2   Years of education: some college   Highest education level: Some college, no degree  Occupational History   Occupation: retired    Comment: daycare  Tobacco Use   Smoking status: Never   Smokeless tobacco: Never  Vaping Use   Vaping Use: Never used  Substance and Sexual Activity   Alcohol use: No   Drug use: No   Sexual activity: Not on file  Other Topics Concern   Not on file  Social History Narrative   Kaetlyn is retired. She lives with her husband Melissa Bowman. They have two grown daughters that live locally. Adalei enjoys reading and puzzles.       Social Determinants of Health   Financial Resource Strain: Low Risk  (04/25/2023)   Overall Financial Resource Strain (CARDIA)    Difficulty of Paying Living Expenses: Not hard at all  Food Insecurity: No Food Insecurity (04/25/2023)   Hunger Vital Sign    Worried About Running Out of Food in the Last Year: Never true    Ran Out of Food in the Last Year: Never true  Transportation Needs: No Transportation Needs (04/25/2023)   PRAPARE - Administrator, Civil Service (Medical): No    Lack of  Transportation (Non-Medical): No  Physical Activity: Insufficiently Active (04/25/2023)   Exercise Vital Sign    Days of Exercise per Week: 3 days    Minutes of Exercise per Session: 30 min  Stress: No Stress Concern Present (04/25/2023)   [employer] of Occupational Health - Occupational Stress Questionnaire    Feeling of Stress : Not at all  Social Connections: Moderately Isolated (04/25/2023)   Social Connection and Isolation Panel [NHANES]    Frequency of Communication with Friends and Family: Patient unable to answer  Frequency of Social Gatherings with Friends and Family: Patient unable to answer    Attends Religious Services: Never    Database administrator or Organizations: No    Attends Banker Meetings: Never    Marital Status: Married    Allergies:  Allergies  Allergen Reactions   Statins Other (See Comments)    myalgias   E-Mycin [Erythromycin] Other (See Comments)    Medication intolerance stomach cramps   Estrogens Other (See Comments)    Estrogen replacement Therapy intolerance mood changes   Sulfa Antibiotics Other (See Comments)    Causes severe stomach cramps    Vioxx [Rofecoxib] Other (See Comments)    Unknown reaction/interaction. Possible stomach cramps    Current Medications: Current Outpatient Medications  Medication Sig Dispense Refill   sertraline (ZOLOFT) 50 MG tablet Take 1 tablet (50 mg total) by mouth daily. 90 tablet 2   Ascorbic Acid (VITAMIN C) 1000 MG tablet Take 1,000 mg by mouth daily.     aspirin EC 81 MG tablet Take 1 tablet (81 mg total) by mouth daily.     azelastine (ASTELIN) 0.1 % nasal spray Place 1 spray into both nostrils 2 (two) times daily. 30 mL 12   Calcium Carb-Cholecalciferol 600-800 MG-UNIT TABS Take 2 tablets by mouth daily.     Cholecalciferol (VITAMIN D-3) 5000 UNITS TABS Take 5,000 Units by mouth daily.     fenofibrate 160 MG tablet Take 1 tablet (160 mg total) by mouth daily. 90 tablet 1   fluticasone  (FLONASE) 50 MCG/ACT nasal spray Use 2 sprays in each  nostril daily 48 g 2   irbesartan-hydrochlorothiazide (AVALIDE) 150-12.5 MG tablet Take 1 tablet by mouth daily. 100 tablet 1   memantine (NAMENDA) 5 MG tablet Take 1 tablet (5 mg total) by mouth 2 (two) times daily. 180 tablet 3   mirtazapine (REMERON) 45 MG tablet Take 1 tablet (45 mg total) by mouth at bedtime. 90 tablet 1   montelukast (SINGULAIR) 10 MG tablet Take 1 tablet (10 mg total) by mouth daily. 100 tablet 3   omeprazole (PRILOSEC) 40 MG capsule TAKE 1 CAPSULE BY MOUTH DAILY 100 capsule 1   PROAIR HFA 108 (90 BASE) MCG/ACT inhaler Use 2 puffs into the lungs  every 6 hours as needed for wheezing or shortness of  breath 25.5 g 2   Current Facility-Administered Medications  Medication Dose Route Frequency Provider Last Rate Last Admin   0.9 %  sodium chloride infusion  500 mL Intravenous Once Jenel Lucks, MD        ROS: Review of Systems  HENT:         Hard of hearing  Neurological:  Positive for tremors.       Memory loss  Psychiatric/Behavioral:  Positive for dysphoric mood. Negative for self-injury, sleep disturbance and suicidal ideas.     Objective:  Psychiatric Specialty Exam: There were no vitals taken for this visit.There is no height or weight on file to calculate BMI.  General Appearance: Casual, Neat, Well Groomed, and wearing glasses. Appears stated age  Eye Contact:  Fair  Speech:   slight speech impediment. Delay in responses. Short sentence structure.  Volume:  Normal  Mood:   "Doing good"  Affect:  Appropriate, Congruent, and improved range most likely due to wearing hearing aids. Social smile and able to laugh  Thought Content: Hallucinations: None and better able to participate in interview than previously    Suicidal Thoughts:  No  Homicidal  Thoughts:  No  Thought Process:  Descriptions of Associations: Intact. Concrete  Orientation:  Full (Time, Place, and Person)    Memory:  Immediate;    Poor Recent;   Poor Remote;   Fair  Judgment:  Fair  Insight:  Fair  Concentration:  Concentration: Fair and Attention Span: Fair  Recall:  Fair  Fund of Knowledge: Fair  Language: Fair  Psychomotor Activity:  TD of jaw, lips, tongue, feet  Akathisia:  No  AIMS (if indicated): Done, stable from prior  Assets:  Desire for Improvement Financial Resources/Insurance Housing Intimacy Leisure Time Physical Health Resilience Social Support Talents/Skills Transportation  ADL's:  Intact  Cognition: Impaired,  Mild  Sleep:  Good   PE: General: sits comfortably in view of camera; no acute distress  Pulm: no increased work of breathing on room air  MSK: all extremity movements appear intact  Neuro: resting tremor of jaw, lip smacking, tongue tremor on extension and at rest, in feet as well Gait & Station: unable to assess by video    Metabolic Disorder Labs: Lab Results  Component Value Date   HGBA1C 5.3 03/09/2023   MPG 134.11 01/12/2018   No results found for: "PROLACTIN" Lab Results  Component Value Date   CHOL 190 03/09/2023   TRIG 226 (H) 03/09/2023   HDL 34 (L) 03/09/2023   CHOLHDL 5.6 (H) 03/09/2023   LDLCALC 116 (H) 03/09/2023   LDLCALC 105 (H) 02/03/2022   Lab Results  Component Value Date   TSH 2.290 01/21/2021   TSH 1.820 05/27/2020    Therapeutic Level Labs: No results found for: "LITHIUM" No results found for: "VALPROATE" No results found for: "CBMZ"  Screenings:  GAD-7    Flowsheet Row Office Visit from 04/18/2023 in [facility] Office Visit from 03/09/2023 in [facility] Office Visit from 02/01/2023 in [facility] Office Visit from 09/10/2022 in [facility] Office Visit from 08/03/2022 in [facility]  Total GAD-7 Score 0 0 0 0 0      Mini-Mental    Flowsheet Row Clinical Support  from 02/07/2019 in [facility]  Total Score (max 30 points ) 30      PHQ2-9    Flowsheet Row Clinical Support from 04/25/2023 in [facility] Office Visit from 04/18/2023 in [facility] Office Visit from 03/09/2023 in [facility] Office Visit from 02/01/2023 in [facility] Office Visit from 09/10/2022 in [facility]  PHQ-2 Total Score 0 0 0 0 0  PHQ-9 Total Score 0 0 0 0 0      Flowsheet Row Video Visit from 08/19/2022 in [facility]  C-SSRS RISK CATEGORY No Risk       Collaboration of Care: Collaboration of Care: Primary Care Provider AEB coordination of care  Patient/Guardian was advised Release of Information must be obtained prior to any record release in order to collaborate their care with an outside provider. Patient/Guardian was advised if they have not already done so to contact the registration department to sign all necessary forms in order for [place] to release information regarding their care.   Consent: Patient/Guardian gives verbal consent for treatment and assignment of benefits for services provided during this visit. Patient/Guardian expressed understanding and agreed to proceed.  Televisit via video: I connected with Melissa Bowman on 05/12/23 at  2:30 PM EDT by a video enabled telemedicine application and verified that I am speaking with the correct person using two identifiers.  Location: Patient: home Provider: Home office   I discussed the limitations of evaluation and management by telemedicine and the availability of in person appointments. The patient expressed understanding and agreed to proceed.  I discussed the assessment and treatment plan with the patient. The patient was provided an opportunity to ask questions and all were  answered. The patient agreed with the plan and demonstrated an understanding of the instructions.   The patient was advised to call back or seek an in-person evaluation if the symptoms worsen or if the condition fails to improve as anticipated.  I provided 20 minutes of non-face-to-face time during this encounter.  Elsie Lincoln, MD 05/12/2023, 2:45 PM

## 2023-05-15 IMAGING — MG MM DIGITAL SCREENING BILAT W/ TOMO AND CAD
6 of 10 series · 6 of 30 positions shown · non-contrast
Comparison: Previous exam(s).

CLINICAL DATA: Screening.

EXAM:
DIGITAL SCREENING BILATERAL MAMMOGRAM WITH TOMOSYNTHESIS AND CAD
TECHNIQUE: Bilateral screening digital craniocaudal and mediolateral oblique
mammograms were obtained. Bilateral screening digital breast
tomosynthesis was performed. The images were evaluated with
computer-aided detection.

[R MLO synth-2D]
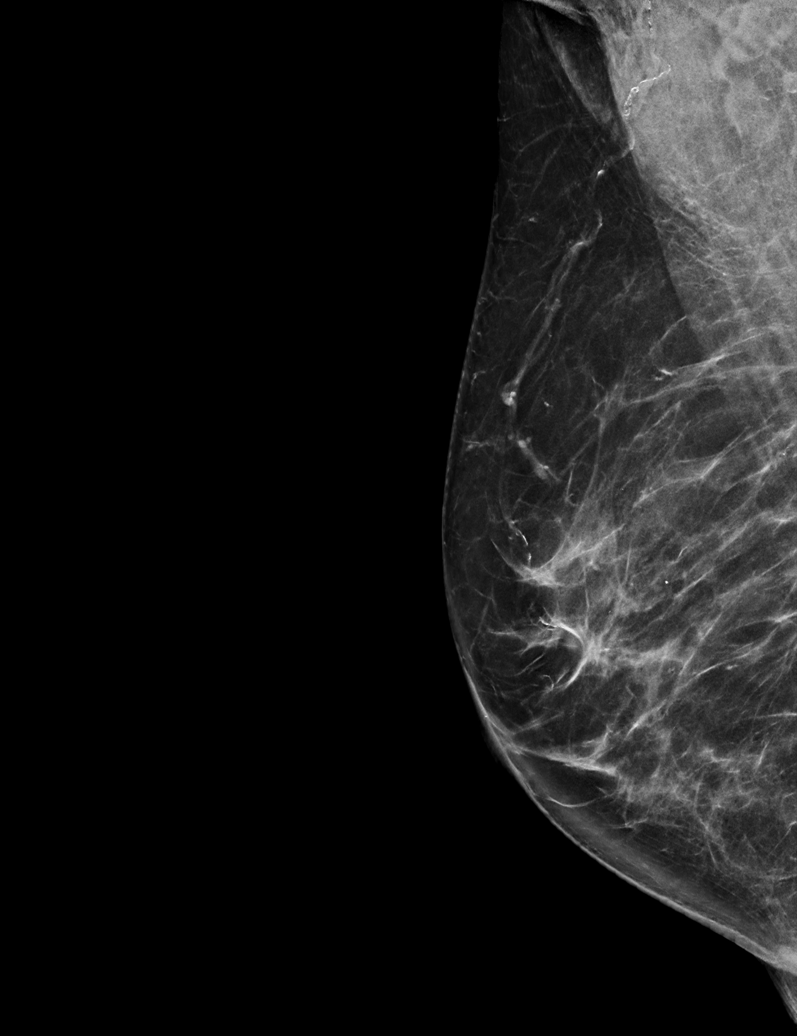

[L MLO synth-2D (1 of 2)]
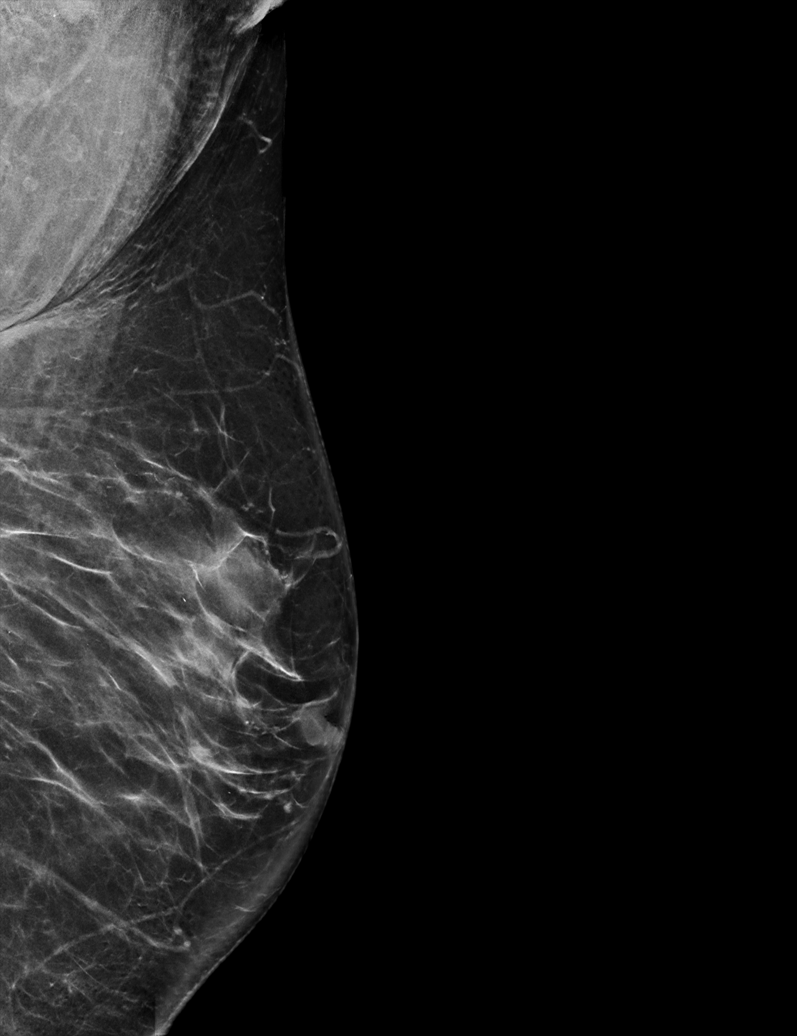

[R CC synth-2D]
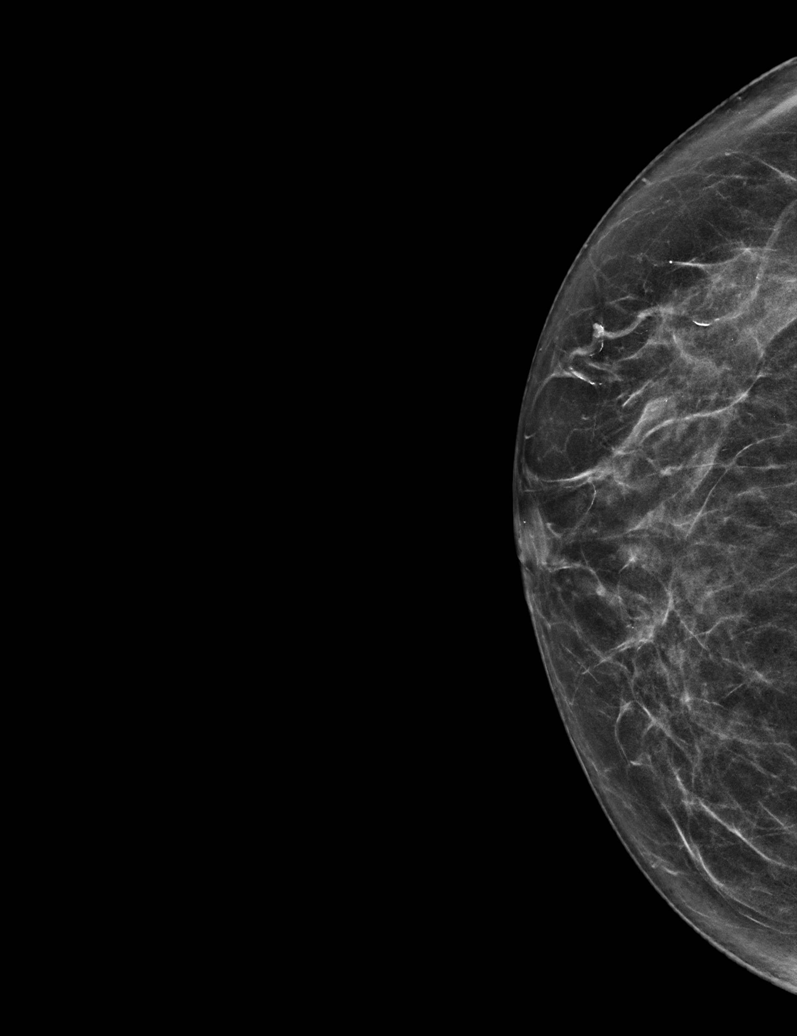

[L MLO synth-2D (2 of 2)]
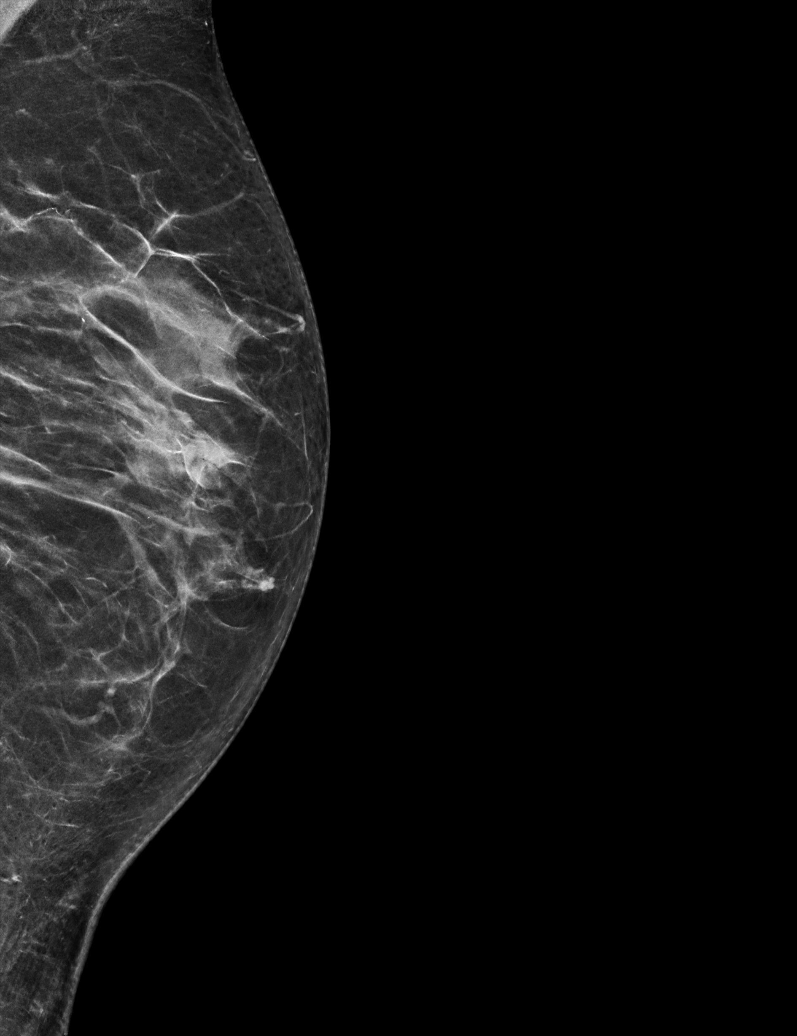

[L CC synth-2D]
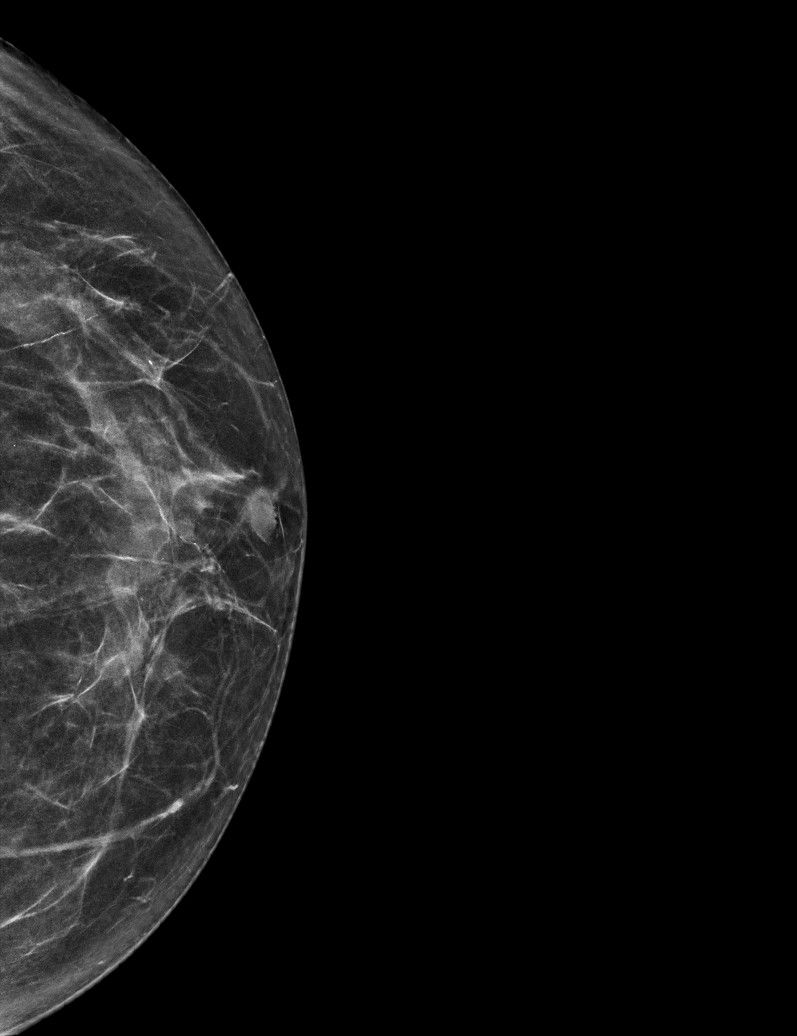

[L MLO tomo · tomo slice 36/71.0]
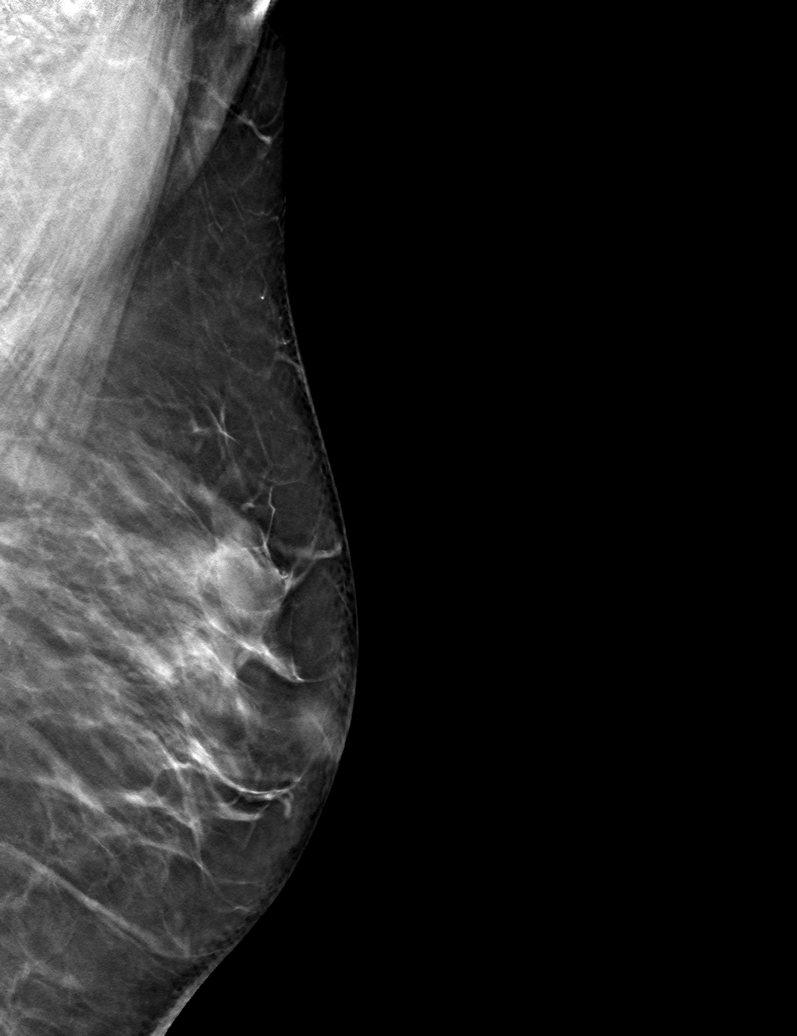

[6 of 30 positions shown; findings below may reference images not displayed]

ACR Breast Density Category c: The breast tissue is heterogeneously
dense, which may obscure small masses.
FINDINGS: There are no findings suspicious for malignancy.
IMPRESSION: No mammographic evidence of malignancy. A result letter of this
screening mammogram will be mailed directly to the patient.

RECOMMENDATION:
Screening mammogram in one year. (Code:Q3-W-BC3)

BI-RADS CATEGORY  1: Negative.

## 2023-05-19 DIAGNOSIS — G4733 Obstructive sleep apnea (adult) (pediatric): Secondary | ICD-10-CM | POA: Diagnosis not present

## 2023-05-21 ENCOUNTER — Encounter (HOSPITAL_COMMUNITY): Payer: Self-pay

## 2023-05-26 ENCOUNTER — Telehealth (HOSPITAL_COMMUNITY): Payer: Medicare HMO | Admitting: Psychiatry

## 2023-06-03 ENCOUNTER — Encounter (INDEPENDENT_AMBULATORY_CARE_PROVIDER_SITE_OTHER): Payer: Medicare HMO | Admitting: Family Medicine

## 2023-06-03 ENCOUNTER — Telehealth: Payer: Medicare HMO | Admitting: Nurse Practitioner

## 2023-06-03 ENCOUNTER — Encounter: Payer: Self-pay | Admitting: Nurse Practitioner

## 2023-06-03 DIAGNOSIS — R0982 Postnasal drip: Secondary | ICD-10-CM

## 2023-06-03 DIAGNOSIS — J324 Chronic pansinusitis: Secondary | ICD-10-CM | POA: Diagnosis not present

## 2023-06-03 DIAGNOSIS — J309 Allergic rhinitis, unspecified: Secondary | ICD-10-CM

## 2023-06-03 MED ORDER — DOXYCYCLINE HYCLATE 100 MG PO TABS
100.0000 mg | ORAL_TABLET | Freq: Two times a day (BID) | ORAL | 0 refills | Status: DC
Start: 2023-06-03 — End: 2023-07-01

## 2023-06-03 NOTE — Progress Notes (Signed)
Virtual Visit Consent   Melissa Bowman, you are scheduled for a virtual visit with a [facility] provider today. Just as with appointments in the office, your consent must be obtained to participate. Your consent will be active for this visit and any virtual visit you may have with one of our providers in the next 365 days. If you have a MyChart account, a copy of this consent can be sent to you electronically.  As this is a virtual visit, video technology does not allow for your provider to perform a traditional examination. This may limit your provider's ability to fully assess your condition. If your provider identifies any concerns that need to be evaluated in person or the need to arrange testing (such as labs, EKG, etc.), we will make arrangements to do so. Although advances in technology are sophisticated, we cannot ensure that it will always work on either your end or our end. If the connection with a video visit is poor, the visit may have to be switched to a telephone visit. With either a video or telephone visit, we are not always able to ensure that we have a secure connection.  By engaging in this virtual visit, you consent to the provision of healthcare and authorize for your insurance to be billed (if applicable) for the services provided during this visit. Depending on your insurance coverage, you may receive a charge related to this service.  I need to obtain your verbal consent now. Are you willing to proceed with your visit today? Melissa Bowman has provided verbal consent on 06/03/2023 for a virtual visit (video or telephone). Melissa Simas, FNP  Date: 06/03/2023 1:54 PM  Virtual Visit via Video Note   I, Melissa Bowman, connected with  Melissa Bowman  ([id number], 1951/04/19) on 06/03/23 at  2:00 PM EDT by a video-enabled telemedicine application and verified that I am speaking with the correct person using two identifiers.  Location: Patient: Virtual Visit Location Patient:  Home Provider: Virtual Visit Location Provider: Home Office   I discussed the limitations of evaluation and management by telemedicine and the availability of in person appointments. The patient expressed understanding and agreed to proceed.    History of Present Illness: Melissa Bowman is a 72 y.o. who identifies as a female who was assigned female at birth, and is being seen today for complaints of the feeling of fluid in her right ear, when she blows her nose she feels the fluid move When she puts her finger in her ear she can get relief   She has started using Flonase and Claritin without relief  Has post nasal drainage that continues as well  She visited her PCP in May to discuss the congestion, PND and clearing of throat that was bothering her then. Symptoms have progressed   Husband has noted a low grade fever that has been persistent 99 on average  She has a slight cough that is productive at times.  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   Sleep apnea 12/24/2021   Heart murmur 12/24/2021   Hyperlipidemia associated with  type 2 diabetes mellitus (HCC) 10/30/2019   Osteoarthritis of multiple joints 04/02/2019   Right rotator cuff tear 01/19/2018   Osteopenia after menopause 12/26/2013   Type 2 diabetes mellitus (HCC) 10/31/2013   Hypertension associated with diabetes (HCC) 05/14/2013   Allergic rhinitis 05/14/2013   Vitamin D deficiency 05/14/2013   Hemorrhage of rectum and anus 09/15/2011   Dysphagia, pharyngoesophageal phase 09/15/2011   Chronic RUQ pain  09/15/2011   Diverticulosis of colon (without mention of hemorrhage) 09/15/2011   BRBPR (bright red blood per rectum) 08/27/2011   Internal hemorrhoids without mention of complication 08/27/2011   Esophageal reflux 08/27/2011   Abdominal pain 08/27/2011   Esophageal dysphagia 08/27/2011   Obesity 08/27/2011   Central sleep apnea due to medical condition 08/27/2011   Hematochezia 08/27/2011    Allergies:  Allergies  Allergen Reactions   Statins Other (See Comments)    myalgias   E-Mycin [Erythromycin] Other (See Comments)    Medication intolerance stomach cramps   Estrogens Other (See Comments)    Estrogen replacement Therapy intolerance mood changes   Sulfa Antibiotics Other (See Comments)    Causes severe stomach cramps    Vioxx [Rofecoxib] Other (See Comments)    Unknown reaction/interaction. Possible stomach cramps   Medications:  Current Outpatient Medications:    Ascorbic Acid (VITAMIN C) 1000 MG tablet, Take 1,000 mg by mouth daily., Disp: , Rfl:    aspirin EC 81 MG tablet, Take 1 tablet (81 mg total) by mouth daily., Disp: , Rfl:    azelastine (ASTELIN) 0.1 % nasal spray, Place 1 spray into both nostrils 2 (two) times daily., Disp: 30 mL, Rfl: 12   Calcium Carb-Cholecalciferol 600-800 MG-UNIT TABS, Take 2 tablets by mouth daily., Disp: , Rfl:    Cholecalciferol (VITAMIN D-3) 5000 UNITS TABS, Take 5,000 Units by mouth daily., Disp: , Rfl:    fenofibrate 160 MG tablet, Take 1 tablet (160 mg total) by mouth daily., Disp: 90 tablet, Rfl: 1   fluticasone (FLONASE) 50 MCG/ACT nasal spray, Use 2 sprays in each  nostril daily, Disp: 48 g, Rfl: 2   irbesartan-hydrochlorothiazide (AVALIDE) 150-12.5 MG tablet, Take 1 tablet by mouth daily., Disp: 100 tablet, Rfl: 1   memantine (NAMENDA) 5 MG tablet, Take 1 tablet (5 mg total) by mouth 2 (two) times daily., Disp: 180 tablet, Rfl: 3   mirtazapine (REMERON) 45 MG tablet, Take 1 tablet (45 mg total) by mouth at bedtime., Disp: 90 tablet,  Rfl: 1   montelukast (SINGULAIR) 10 MG tablet, Take 1 tablet (10 mg total) by mouth daily., Disp: 100 tablet, Rfl: 3   omeprazole (PRILOSEC) 40 MG capsule, TAKE 1 CAPSULE BY MOUTH DAILY, Disp: 100 capsule, Rfl: 1   PROAIR HFA 108 (90 BASE) MCG/ACT inhaler, Use 2 puffs into the lungs  every 6 hours as needed for wheezing or shortness of  breath, Disp: 25.5 g, Rfl: 2   sertraline (ZOLOFT) 50 MG tablet, Take 1 tablet (50 mg total) by mouth daily., Disp: 90 tablet, Rfl: 2  Current Facility-Administered Medications:    0.9 %  sodium chloride infusion, 500 mL, Intravenous, Once, Jenel Lucks, MD  Observations/Objective: Patient is well-developed, well-nourished in no acute distress.  Resting comfortably  at home.  Head is normocephalic, atraumatic.  No labored breathing.  Speech is clear and coherent with logical content.  Patient is alert and oriented at baseline.    Assessment and Plan: 1. Chronic pansinusitis . Continue Flonase and Claritin   - doxycycline (VIBRA-TABS) 100  MG tablet; Take 1 tablet (100 mg total) by mouth 2 (two) times daily for 10 days.  Dispense: 20 tablet; Refill: 0    Hold calcium supplement while on antibiotic then resume   Follow Up Instructions: I discussed the assessment and treatment plan with the patient. The patient was provided an opportunity to ask questions and all were answered. The patient agreed with the plan and demonstrated an understanding of the instructions.  A copy of instructions were sent to the patient via MyChart unless otherwise noted below.    The patient was advised to call back or seek an in-person evaluation if the symptoms worsen or if the condition fails to improve as anticipated.  Time:  I spent 15 minutes with the patient via telehealth technology discussing the above problems/concerns.    Melissa Simas, FNP

## 2023-06-08 ENCOUNTER — Ambulatory Visit
Admission: RE | Admit: 2023-06-08 | Discharge: 2023-06-08 | Disposition: A | Discharge status: Home / Self Care | Payer: Medicare HMO | Source: Ambulatory Visit | Attending: Family Medicine | Admitting: Family Medicine

## 2023-06-08 DIAGNOSIS — Z1231 Encounter for screening mammogram for malignant neoplasm of breast: Secondary | ICD-10-CM

## 2023-06-13 ENCOUNTER — Other Ambulatory Visit: Payer: Self-pay | Admitting: Family Medicine

## 2023-06-13 DIAGNOSIS — Z1231 Encounter for screening mammogram for malignant neoplasm of breast: Secondary | ICD-10-CM

## 2023-06-13 DIAGNOSIS — Z0000 Encounter for general adult medical examination without abnormal findings: Secondary | ICD-10-CM

## 2023-06-18 DIAGNOSIS — G4733 Obstructive sleep apnea (adult) (pediatric): Secondary | ICD-10-CM | POA: Diagnosis not present

## 2023-06-30 DIAGNOSIS — R1310 Dysphagia, unspecified: Secondary | ICD-10-CM | POA: Diagnosis not present

## 2023-06-30 DIAGNOSIS — R6889 Other general symptoms and signs: Secondary | ICD-10-CM | POA: Insufficient documentation

## 2023-07-01 MED ORDER — DESLORATADINE 5 MG PO TABS
5.0000 mg | ORAL_TABLET | Freq: Every day | ORAL | 3 refills | Status: DC
Start: 2023-07-01 — End: 2023-08-01

## 2023-07-01 NOTE — Telephone Encounter (Signed)
Please see the MyChart message reply(ies) for my assessment and plan.    This patient gave consent for this Medical Advice Message and is aware that it may result in a bill to their insurance company, as well as the possibility of receiving a bill for a co-payment or deductible. They are an established patient, but are not seeking medical advice exclusively about a problem treated during an in person or video visit in the last seven days. I did not recommend an in person or video visit within seven days of my reply.    I spent a total of 5 minutes cumulative time within 7 days through MyChart messaging.  Ashly Gottschalk, DO   

## 2023-07-05 ENCOUNTER — Other Ambulatory Visit: Payer: Self-pay | Admitting: Physician Assistant

## 2023-07-05 DIAGNOSIS — R1310 Dysphagia, unspecified: Secondary | ICD-10-CM

## 2023-07-12 ENCOUNTER — Telehealth (HOSPITAL_COMMUNITY): Payer: Medicare HMO | Admitting: Psychiatry

## 2023-07-14 ENCOUNTER — Ambulatory Visit
Admission: RE | Admit: 2023-07-14 | Discharge: 2023-07-14 | Disposition: A | Discharge status: Home / Self Care | Payer: Medicare HMO | Source: Ambulatory Visit | Attending: Physician Assistant | Admitting: Physician Assistant

## 2023-07-14 ENCOUNTER — Other Ambulatory Visit: Payer: Self-pay | Admitting: Physician Assistant

## 2023-07-14 DIAGNOSIS — R1310 Dysphagia, unspecified: Secondary | ICD-10-CM

## 2023-07-19 DIAGNOSIS — G4733 Obstructive sleep apnea (adult) (pediatric): Secondary | ICD-10-CM | POA: Diagnosis not present

## 2023-07-19 DIAGNOSIS — H02105 Unspecified ectropion of left lower eyelid: Secondary | ICD-10-CM | POA: Diagnosis not present

## 2023-07-19 DIAGNOSIS — H52223 Regular astigmatism, bilateral: Secondary | ICD-10-CM | POA: Diagnosis not present

## 2023-07-19 DIAGNOSIS — H524 Presbyopia: Secondary | ICD-10-CM | POA: Diagnosis not present

## 2023-07-19 DIAGNOSIS — H5203 Hypermetropia, bilateral: Secondary | ICD-10-CM | POA: Diagnosis not present

## 2023-07-19 DIAGNOSIS — H02102 Unspecified ectropion of right lower eyelid: Secondary | ICD-10-CM | POA: Diagnosis not present

## 2023-07-19 DIAGNOSIS — H25813 Combined forms of age-related cataract, bilateral: Secondary | ICD-10-CM | POA: Diagnosis not present

## 2023-07-19 LAB — HM DIABETES EYE EXAM

## 2023-08-01 ENCOUNTER — Encounter (HOSPITAL_COMMUNITY): Payer: Self-pay | Admitting: Psychiatry

## 2023-08-01 ENCOUNTER — Telehealth (HOSPITAL_COMMUNITY): Payer: Medicare HMO | Admitting: Psychiatry

## 2023-08-01 DIAGNOSIS — G4700 Insomnia, unspecified: Secondary | ICD-10-CM | POA: Diagnosis not present

## 2023-08-01 DIAGNOSIS — G2401 Drug induced subacute dyskinesia: Secondary | ICD-10-CM

## 2023-08-01 DIAGNOSIS — G3184 Mild cognitive impairment, so stated: Secondary | ICD-10-CM

## 2023-08-01 DIAGNOSIS — H9190 Unspecified hearing loss, unspecified ear: Secondary | ICD-10-CM | POA: Diagnosis not present

## 2023-08-01 DIAGNOSIS — F09 Unspecified mental disorder due to known physiological condition: Secondary | ICD-10-CM

## 2023-08-01 DIAGNOSIS — F330 Major depressive disorder, recurrent, mild: Secondary | ICD-10-CM

## 2023-08-01 DIAGNOSIS — T43505A Adverse effect of unspecified antipsychotics and neuroleptics, initial encounter: Secondary | ICD-10-CM | POA: Diagnosis not present

## 2023-08-01 DIAGNOSIS — G4731 Primary central sleep apnea: Secondary | ICD-10-CM

## 2023-08-01 NOTE — Patient Instructions (Signed)
We did not make any medication changes today.  Keep up the good work with playing with your grandchildren and staying on top of taking your medication.

## 2023-08-01 NOTE — Progress Notes (Signed)
BH MD Outpatient Progress Note  08/01/2023 1:20 PM Melissa Bowman  MRN:  [id number]  Assessment:  Melissa Bowman presents for follow-up evaluation. Today, 08/01/23, patient reports improvement to her mood since getting on Zoloft.  Of note with hearing aids she was the most participatory in interview today and was able to remember all of her medications for the first time; the hearing aids will likely help preserve her cognition along with addition of memantine and she has been consistently taking for the last 6 months.  Main areas for concern for her and her husband today was sequela of COVID infection from March including ongoing limited taste for which she has been relying on milkshakes for nutrition.  Similarly with consistent postnasal drip causing her to swallow frequently she has ongoing workup with ENT.   Unfortunately, previous neurology referral confirmed tardive dyskinesia diagnosis but did not want to start VMAT 2 inhibitor; noted today with slightly worsening lip and tongue movements. Follow up in 4 months.  For safety, her acute risk factors for suicide are: Current diagnosis of depression, cognitive impairment.  Her chronic risk factors for suicide are: Retired, chronic mental illness.  Her protective factors are: Supportive family and friends, beloved pets, grandchildren that are frequently in the home, actively seeking and engaging with mental health care, no suicidal ideation.  Future events cannot be fully predicted she does not currently meet IVC criteria and can be continued as an outpatient.  Identifying Information: Melissa Bowman is a 72 y.o. female with a history of mild neurocognitive disorder, major depressive disorder, anxiety, insomnia, neuroleptic tardive dyskinesia, bradycardia with prolonged Qtc, and central sleep apnea  who is an established patient with [facility] participating in follow-up via video conferencing. Initial evaluation on 08/19/22, see  that note for full case formulation. Amitriptyline discontinued due to possible drug interaction with her heart. Her CPAP was adjusted in March by her provider and her husband fine tunes at home. Trials of belsombra and ramelteon proved ineffective. Mirtazapine was next medication trial. Discontinued patient's abilify as her depression appears to be in remission outside of the symptoms of insomnia and some decreased appetite. She was noted to have neuroleptic induced tardive dyskinesia. Also had Qtc prolongation along with sinus bradycardia on independent review of ecg from 06/2022 which was another reason to discontinue the abilify as outlined in plan. Had difficulty hearing vs some delay from abilify use vs early stages of a neurocognitive disorder. Increased concern for the latter due to patient's age, prolonged use of amitriptyline which has a higher anticholinergic load, and frequent checking with husband for answers to some questions during interview. MOCA done with PCPs office after that visit showing MoCA score of 24 on recent examination on 09/10/2022 and patient was prescribed memantine but never picked it up from the pharmacy as of 12/28/2022.    Plan:   # Mild neurocognitive disorder  diminished hearing Past medication trials: memantine, hearing aids Status of problem: Improving Interventions: -- Continue memantine 5mg  bid (s1/16/24, i2/23/24) -- Continue with hearing aids  # Insomnia  Central sleep apnea Past medication trials: amitriptyline (effective), belsombra, ramelteon Status of problem: improving Interventions: -- continue mirtazapine 45mg  once nightly (i10/11/23, i11/10/23, i1/16/24) -- CMP is up to date with normal sodium --Continue CPAP   # Major depressive disorder, recurrent, mild Past medication trials: abilify, lexapro, amitriptyline. 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   Subjective:  Chief Complaint:  Chief Complaint  Patient presents with   Depression   Follow-up   Memory Loss    Interval History: Melissa Bowman doing fine today.  Looking after the kids is keeping her busy and enjoys watching crime dramas on tv. Sleeping well. Likes her hearing aids, has gotten used to them. Lost 10lbs because she couldn't eat from sinus drainage; still can't taste and is relying on milkshakes. The mouth movements don't bother her but are pervasive. Feels like she has to clear her throat a lot and Melissa Bowman adds that ENT has seen her after a barium swallow; though it is still being assessed. Still on CPAP. Still with movement. Will need to switch medication to [facility] [place]. Still taking memantine and Melissa Bowman says her memory is better than his now. Taking twice per day. Not noting any side effects at present.  Visit Diagnosis:    ICD-10-CM   1. Mild cognitive disorder  F09     2. Mild episode of recurrent major depressive disorder (HCC)  F33.0     3. Insomnia, unspecified type  G47.00     4. Neuroleptic-induced tardive dyskinesia  G24.01    T43.505A        Past Psychiatric History:  Diagnoses: mild neurocognitive disorder, major depressive disorder, anxiety, insomnia, neuroleptic tardive dyskinesia Medication trials: abilify, lexapro, amitriptyline, belsombra, melatonin, remeron (currently effective). Others she cannot remember Previous psychiatrist/therapist: biofeedback many years ago Hospitalizations: none Suicide attempts:  none SIB: none Hx of violence towards others: none Current access to guns: will assess at future visit Hx of abuse: none Substance use: none  Past Medical History:  Past Medical History:  Diagnosis Date   Allergic rhinitis    Allergy to ertapenem    Anxiety 08/27/2011   Anxiety and depression    Asthmatic bronchitis    Bilateral carpal tunnel syndrome    Condyloma acuminata    Diverticulosis    Eczema    GERD (gastroesophageal reflux disease)    H. pylori infection 11/16/1993   Hiatal hernia    History of benign adrenal tumor 2007   [facility]   History of pneumonia 2007, 2008   Hyperlipidemia    Hypertension    IBS (irritable bowel syndrome)    Internal hemorrhoids    Kyphosis    Long term current use of antipsychotic medication 08/19/2022   Memory loss    Menopause    Metabolic syndrome    OSA on CPAP    Prolonged Q-T interval on ECG 08/19/2022   Type 2 diabetes mellitus (HCC)    Vitamin D deficiency     Past Surgical History:  Procedure Laterality Date   abdominal wall lematoma of fabdd hys.     APPENDECTOMY     CARPAL TUNNEL RELEASE     CESAREAN SECTION     X2   COLONOSCOPY     INCISIONAL HERNIA REPAIR     right breast biopsy  09/1995   ROTATOR CUFF REPAIR  2002   right   ROTATOR CUFF REPAIR/ bone spur  08/30/2009   SHOULDER ARTHROSCOPY WITH ROTATOR CUFF REPAIR AND  SUBACROMIAL DECOMPRESSION Right 01/19/2018   Procedure: REVISION RIGHT MINI OPEN ROTATOR CUFF REPAIR AND SUBACROMIAL DECOMPRESSION;  Surgeon: Jene Every, MD;  Location: [facility];  Service: Orthopedics;  Laterality: Right;  90 MINS   sleep apnea surgery  04/1995   torn cartiledge rt knee x2  05/13/2006   Dr. Sherlean Foot Smoc    TOTAL ABDOMINAL HYSTERECTOMY W/ BILATERAL SALPINGOOPHORECTOMY  07/1993   fibroids     Family Psychiatric History: both daughters with depression  Family History:  Family History  Problem Relation Age of Onset   Heart disease Mother    Parkinsonism Father    Emphysema  Father    Hip fracture Father    Heart failure Sister    Ovarian cancer Sister    COPD Sister    Lymphoma Sister    COPD Sister    COPD Brother    Hypertension Brother    Heart disease Brother    COPD Brother    Colon cancer Neg Hx    Esophageal cancer Neg Hx    Pancreatic cancer Neg Hx    Stomach cancer Neg Hx    Breast cancer Neg Hx     Social History:  Social History   Socioeconomic History   Marital status: Married    Spouse name: Melissa Bowman   Number of children: 2   Years of education: some college   Highest education level: Some college, no degree  Occupational History   Occupation: retired    Comment: daycare  Tobacco Use   Smoking status: Never   Smokeless tobacco: Never  Vaping Use   Vaping status: Never Used  Substance and Sexual Activity   Alcohol use: No   Drug use: No   Sexual activity: Not on file  Other Topics Concern   Not on file  Social History Narrative   Julianny is retired. She lives with her husband Melissa Bowman. They have two grown daughters that live locally. Cheril enjoys reading and puzzles.       Social Determinants of Health   Financial Resource Strain: Low Risk  (04/25/2023)   Overall Financial Resource Strain (CARDIA)    Difficulty of Paying Living Expenses: Not hard at all  Food Insecurity: No Food Insecurity (04/25/2023)   Hunger Vital Sign    Worried About Running Out of Food in the Last Year: Never true    Ran Out of Food in the Last Year: Never true  Transportation Needs: No Transportation Needs (04/25/2023)   PRAPARE - Administrator, Civil Service (Medical): No    Lack of Transportation (Non-Medical): No  Physical Activity: Insufficiently Active (04/25/2023)   Exercise Vital Sign    Days of Exercise per Week: 3 days    Minutes of Exercise per Session: 30 min  Stress: No Stress Concern Present (04/25/2023)   [employer] of Occupational Health - Occupational Stress Questionnaire    Feeling of Stress : Not at all   Social Connections: Unknown (04/25/2023)   Social Connection and Isolation Panel [NHANES]    Frequency of Communication with Friends and Family: Patient unable to answer    Frequency of Social Gatherings with Friends and Family: Patient unable to answer    Attends Religious Services: Never    Database administrator or Organizations: No    Attends Banker Meetings: Never    Marital Status: Married    Allergies:  Allergies  Allergen Reactions   Statins Other (See Comments)    myalgias   E-Mycin [Erythromycin]  Other (See Comments)    Medication intolerance stomach cramps   Estrogens Other (See Comments)    Estrogen replacement Therapy intolerance mood changes   Sulfa Antibiotics Other (See Comments)    Causes severe stomach cramps    Vioxx [Rofecoxib] Other (See Comments)    Unknown reaction/interaction. Possible stomach cramps    Current Medications: Current Outpatient Medications  Medication Sig Dispense Refill   Ascorbic Acid (VITAMIN C) 1000 MG tablet Take 1,000 mg by mouth daily.     azelastine (ASTELIN) 0.1 % nasal spray Place 1 spray into both nostrils 2 (two) times daily. 30 mL 12   Calcium Carb-Cholecalciferol 600-800 MG-UNIT TABS Take 2 tablets by mouth daily.     Cholecalciferol (VITAMIN D-3) 5000 UNITS TABS Take 5,000 Units by mouth daily.     fenofibrate 160 MG tablet Take 1 tablet (160 mg total) by mouth daily. 90 tablet 1   fluticasone (FLONASE) 50 MCG/ACT nasal spray Use 2 sprays in each  nostril daily 48 g 2   irbesartan-hydrochlorothiazide (AVALIDE) 150-12.5 MG tablet Take 1 tablet by mouth daily. 100 tablet 1   memantine (NAMENDA) 5 MG tablet Take 1 tablet (5 mg total) by mouth 2 (two) times daily. 180 tablet 3   mirtazapine (REMERON) 45 MG tablet Take 1 tablet (45 mg total) by mouth at bedtime. 90 tablet 1   montelukast (SINGULAIR) 10 MG tablet Take 1 tablet (10 mg total) by mouth daily. 100 tablet 3   omeprazole (PRILOSEC) 40 MG capsule TAKE 1  CAPSULE BY MOUTH DAILY 100 capsule 1   PROAIR HFA 108 (90 BASE) MCG/ACT inhaler Use 2 puffs into the lungs  every 6 hours as needed for wheezing or shortness of  breath 25.5 g 2   sertraline (ZOLOFT) 50 MG tablet Take 1 tablet (50 mg total) by mouth daily. 90 tablet 2   Current Facility-Administered Medications  Medication Dose Route Frequency Provider Last Rate Last Admin   0.9 %  sodium chloride infusion  500 mL Intravenous Once Jenel Lucks, MD        ROS: Review of Systems  HENT:  Positive for postnasal drip and trouble swallowing.        Hard of hearing  Gastrointestinal:  Negative for constipation and diarrhea.  Neurological:  Positive for tremors.       Memory loss  Psychiatric/Behavioral:  Negative for dysphoric mood, self-injury, sleep disturbance and suicidal ideas.     Objective:  Psychiatric Specialty Exam: There were no vitals taken for this visit.There is no height or weight on file to calculate BMI.  General Appearance: Casual, Neat, Well Groomed, and wearing glasses. Appears stated age  Eye Contact:  Fair  Speech:   slight speech impediment. Delay in responses has lessened. Short sentence structure but improving.  Volume:  Normal  Mood:   "Doing good"  Affect:  Appropriate, Congruent, and improved range most likely due to wearing hearing aids. Social smile and able to laugh.  Euthymic  Thought Content: Logical, Hallucinations: None, and better able to participate in interview than previously    Suicidal Thoughts:  No  Homicidal Thoughts:  No  Thought Process:  Descriptions of Associations: Intact. Concrete  Orientation:  Full (Time, Place, and Person)    Memory:  Immediate;   Poor Recent;   Poor Remote;   Fair  Judgment:  Fair  Insight:  Fair  Concentration:  Concentration: Fair and Attention Span: Fair  Recall:  Jennelle Human of Knowledge: Fair  Language:  Fair  Psychomotor Activity:  TD of jaw, lips, tongue, feet  Akathisia:  No  AIMS (if indicated):  Done, slight worsening of lips and tongue involuntary movement  Assets:  Desire for Improvement Financial Resources/Insurance Housing Intimacy Leisure Time Physical Health Resilience Social Support Talents/Skills Transportation  ADL's:  Intact  Cognition: Impaired,  Mild  Sleep:  Good   PE: General: sits comfortably in view of camera; no acute distress  Pulm: no increased work of breathing on room air  MSK: all extremity movements appear intact  Neuro: resting tremor of jaw, lip smacking, tongue tremor on extension and at rest, in feet as well Gait & Station: unable to assess by video    Metabolic Disorder Labs: Lab Results  Component Value Date   HGBA1C 5.3 03/09/2023   MPG 134.11 01/12/2018   No results found for: "PROLACTIN" Lab Results  Component Value Date   CHOL 190 03/09/2023   TRIG 226 (H) 03/09/2023   HDL 34 (L) 03/09/2023   CHOLHDL 5.6 (H) 03/09/2023   LDLCALC 116 (H) 03/09/2023   LDLCALC 105 (H) 02/03/2022   Lab Results  Component Value Date   TSH 2.290 01/21/2021   TSH 1.820 05/27/2020    Therapeutic Level Labs: No results found for: "LITHIUM" No results found for: "VALPROATE" No results found for: "CBMZ"  Screenings:  GAD-7    Flowsheet Row Office Visit from 04/18/2023 in [facility] Office Visit from 03/09/2023 in [facility] Office Visit from 02/01/2023 in [facility] Office Visit from 09/10/2022 in [facility] Office Visit from 08/03/2022 in [facility]  Total GAD-7 Score 0 0 0 0 0      Mini-Mental    Flowsheet Row Clinical Support from 02/07/2019 in [facility]  Total Score (max 30 points ) 30      PHQ2-9    Flowsheet Row Clinical Support from 04/25/2023 in [facility] Office Visit from 04/18/2023 in  [facility] Office Visit from 03/09/2023 in [facility] Office Visit from 02/01/2023 in [facility] Office Visit from 09/10/2022 in [facility]  PHQ-2 Total Score 0 0 0 0 0  PHQ-9 Total Score 0 0 0 0 0      Flowsheet Row Video Visit from 08/19/2022 in [facility] [place]  C-SSRS RISK CATEGORY No Risk       Collaboration of Care: Collaboration of Care: Primary Care Provider AEB coordination of care  Patient/Guardian was advised Release of Information must be obtained prior to any record release in order to collaborate their care with an outside provider. Patient/Guardian was advised if they have not already done so to contact the registration department to sign all necessary forms in order for [place] to release information regarding their care.   Consent: Patient/Guardian gives verbal consent for treatment and assignment of benefits for services provided during this visit. Patient/Guardian expressed understanding and agreed to proceed.   Televisit via video: I connected with Melissa Bowman on 08/01/23 at  1:00 PM EDT by a video enabled telemedicine application and verified that I am speaking with the correct person using two identifiers.  Location: Patient: home Provider: Home office   I discussed the limitations of evaluation and management by telemedicine and the availability of in person  appointments. The patient expressed understanding and agreed to proceed.  I discussed the assessment and treatment plan with the patient. The patient was provided an opportunity to ask questions and all were answered. The patient agreed with the plan and demonstrated an understanding of the instructions.   The patient was advised to call back or seek an in-person evaluation if the symptoms worsen or if the condition fails to improve as  anticipated.  I provided 15 minutes of virtual face-to-face time during this encounter including collateral from her husband.  Elsie Lincoln, MD 08/01/2023, 1:20 PM

## 2023-08-11 ENCOUNTER — Other Ambulatory Visit: Payer: Self-pay | Admitting: Family Medicine

## 2023-08-25 ENCOUNTER — Encounter: Payer: Self-pay | Admitting: Gastroenterology

## 2023-08-25 ENCOUNTER — Ambulatory Visit: Payer: Medicare HMO | Admitting: Gastroenterology

## 2023-08-25 VITALS — BP 118/64 | HR 92 | Ht 60.0 in | Wt 147.2 lb

## 2023-08-25 DIAGNOSIS — R634 Abnormal weight loss: Secondary | ICD-10-CM | POA: Diagnosis not present

## 2023-08-25 DIAGNOSIS — K219 Gastro-esophageal reflux disease without esophagitis: Secondary | ICD-10-CM

## 2023-08-25 MED ORDER — FAMOTIDINE 20 MG PO TABS: 20.0000 mg | ORAL_TABLET | Freq: Every day | ORAL | 1 refills | Status: DC

## 2023-08-25 NOTE — Patient Instructions (Addendum)
Increase Omeprazole 40 mg twice daily 30-60 minutes before breakfast and dinner.   We have sent the following medications to your pharmacy for you to pick up at your convenience: Pepcid 20 mg nightly before bedtime.   Call or send Mychart message with an update in 4-6 weeks, ask for Patty.  _______________________________________________________  If your blood pressure at your visit was 140/90 or greater, please contact your primary care physician to follow up on this.  _______________________________________________________  If you are age 56 or older, your body mass index should be between 23-30. Your Body mass index is 28.75 kg/m. If this is out of the aforementioned range listed, please consider follow up with your Primary Care Provider.  If you are age 50 or younger, your body mass index should be between 19-25. Your Body mass index is 28.75 kg/m. If this is out of the aformentioned range listed, please consider follow up with your Primary Care Provider.   ________________________________________________________  The [facility] providers would like to encourage you to use [facility] to communicate with providers for non-urgent requests or questions.  Due to long hold times on the telephone, sending your provider a message by [facility] may be a faster and more efficient way to get a response.  Please allow 48 business hours for a response.  Please remember that this is for non-urgent requests.  _______________________________________________________

## 2023-08-25 NOTE — Progress Notes (Signed)
08/25/2023 Melissa Bowman [id number] 27-May-1951   HISTORY OF PRESENT ILLNESS: This is a 72 year old female who is a patient of Dr. Milas Hock.  Was seen by him back in July 2022 for evaluation of unintentional weight loss.  She underwent EGD and colonoscopy as well as a CT scan of the abdomen and pelvis with contrast that did not show any cause of her weight loss.  Has not been seen here since that time, over 2 years ago.  Now here again for with what the referral says is "dysphagia".  Patient says that she does not feel like stuff is getting stuck, but she does feel like she has acid reflux with everything that she eats or drinks.  She tells me she had COVID a little while back and that messed up her taste and then she had a lot of drainage and now everything just taste bad so she is not eating because of that and because of the reflux.  She is on omeprazole 40 mg daily.  She just had an esophagram last month that showed esophageal dysmotility and a tiny hiatal hernia.   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 pylori infection 11/16/1993   Hiatal hernia    History of benign adrenal tumor 2007   [facility]   History of pneumonia 2007, 2008   Hyperlipidemia    Hypertension    IBS (irritable bowel syndrome)    Internal hemorrhoids    Kyphosis    Long term current use of antipsychotic medication 08/19/2022   Memory loss    Menopause    Metabolic syndrome    OSA on CPAP    Prolonged Q-T interval on ECG 08/19/2022   Type 2 diabetes mellitus (HCC)    Vitamin D deficiency    Past Surgical History:  Procedure Laterality Date   abdominal wall lematoma of fabdd hys.     APPENDECTOMY     CARPAL TUNNEL RELEASE     CESAREAN SECTION      X2   COLONOSCOPY     INCISIONAL HERNIA REPAIR     right breast biopsy  09/1995   ROTATOR CUFF REPAIR  2002   right   ROTATOR CUFF REPAIR/ bone spur  08/30/2009   SHOULDER ARTHROSCOPY WITH ROTATOR CUFF REPAIR AND SUBACROMIAL DECOMPRESSION Right 01/19/2018   Procedure: REVISION RIGHT MINI OPEN ROTATOR CUFF REPAIR AND SUBACROMIAL DECOMPRESSION;  Surgeon: Jene Every, MD;  Location: [facility];  Service: Orthopedics;  Laterality: Right;  90 MINS   sleep apnea surgery  04/1995   torn cartiledge rt knee x2  05/13/2006   Dr. Trenton Founds    TOTAL ABDOMINAL HYSTERECTOMY W/ BILATERAL SALPINGOOPHORECTOMY  07/1993   fibroids     reports that she has never smoked. She has never used smokeless tobacco. She reports that she does not drink alcohol and does not use drugs. family history includes COPD in her brother, brother, sister, and sister; Emphysema in her father; Heart disease in her brother and mother; Heart failure in her sister; Hip fracture in her father; Hypertension in her brother; Lymphoma in her sister; Ovarian cancer in her sister; Parkinsonism in her father. Allergies  Allergen Reactions  Statins Other (See Comments)    myalgias   E-Mycin [Erythromycin] Other (See Comments)    Medication intolerance stomach cramps   Estrogens Other (See Comments)    Estrogen replacement Therapy intolerance mood changes   Sulfa Antibiotics Other (See Comments)    Causes severe stomach cramps    Vioxx [Rofecoxib] Other (See Comments)    Unknown reaction/interaction. Possible stomach cramps      Outpatient Encounter Medications as of 08/25/2023  Medication Sig   Ascorbic Acid (VITAMIN C) 1000 MG tablet Take 1,000 mg by mouth daily.   azelastine (ASTELIN) 0.1 % nasal spray Place 1 spray into both nostrils 2 (two) times daily.   Calcium Carb-Cholecalciferol 600-800 MG-UNIT TABS Take 2 tablets by mouth daily.   Cholecalciferol (VITAMIN D-3) 5000 UNITS TABS Take 5,000 Units by mouth daily.   fenofibrate  160 MG tablet TAKE 1 TABLET BY MOUTH EVERY DAY   fluticasone (FLONASE) 50 MCG/ACT nasal spray Use 2 sprays in each  nostril daily   irbesartan-hydrochlorothiazide (AVALIDE) 150-12.5 MG tablet Take 1 tablet by mouth daily.   memantine (NAMENDA) 5 MG tablet Take 1 tablet (5 mg total) by mouth 2 (two) times daily.   mirtazapine (REMERON) 45 MG tablet Take 1 tablet (45 mg total) by mouth at bedtime.   montelukast (SINGULAIR) 10 MG tablet Take 1 tablet (10 mg total) by mouth daily.   omeprazole (PRILOSEC) 40 MG capsule TAKE 1 CAPSULE BY MOUTH DAILY   PROAIR HFA 108 (90 BASE) MCG/ACT inhaler Use 2 puffs into the lungs  every 6 hours as needed for wheezing or shortness of  breath   sertraline (ZOLOFT) 50 MG tablet Take 1 tablet (50 mg total) by mouth daily.   Facility-Administered Encounter Medications as of 08/25/2023  Medication   0.9 %  sodium chloride infusion    REVIEW OF SYSTEMS  : All other systems reviewed and negative except where noted in the History of Present Illness.   PHYSICAL EXAM: BP 118/64   Pulse 92   Ht 5' (1.524 m)   Wt 147 lb 3.2 oz (66.8 kg)   SpO2 99%   BMI 28.75 kg/m  General: Well developed white female in no acute distress Head: Normocephalic and atraumatic Eyes:  Sclerae anicteric, conjunctiva pink. Ears: Normal auditory acuity Lungs: Clear throughout to auscultation; no W/R/R. Heart: Regular rate and rhythm; no M/R/G. Abdomen: Soft, non-distended.  BS present.  Mild epigastric TTP. Musculoskeletal: Symmetrical with no gross deformities  Skin: No lesions on visible extremities Extremities: No edema  Neurological: Alert oriented x 4, grossly non-focal Psychological:  Alert and cooperative. Normal mood and affect  ASSESSMENT AND PLAN: *GERD: Reports increase in reflux despite taking her omeprazole 40 mg every day.  Not really feeling like food getting stuck.  Just had an esophagram that showed esophageal dysmotility and a tiny hiatal hernia.  She has lost 10  pounds recently, but is not eating because everything tastes bad and because of the reflux.  Had EGD, colonoscopy, and CT scan in 2022 for complaints of weight loss, but everything was really unrevealing with no source of symptoms identified.  Will increase her omeprazole to twice daily and add famotidine 20 mg daily at bedtime.  Prescription sent to pharmacy.  They will call us back or send a message with an update in 4 to 6 weeks.  CC:  [name], DO

## 2023-08-26 DIAGNOSIS — G4733 Obstructive sleep apnea (adult) (pediatric): Secondary | ICD-10-CM | POA: Diagnosis not present

## 2023-08-26 NOTE — Progress Notes (Signed)
Agree with the assessment and plan as outlined by Doug Sou, PA-C.  Coston Mandato E. Tomasa Rand, MD  [facility]

## 2023-08-30 DIAGNOSIS — Z008 Encounter for other general examination: Secondary | ICD-10-CM | POA: Diagnosis not present

## 2023-09-07 ENCOUNTER — Other Ambulatory Visit: Payer: Self-pay | Admitting: Family Medicine

## 2023-09-12 ENCOUNTER — Encounter: Payer: Self-pay | Admitting: Family Medicine

## 2023-09-12 ENCOUNTER — Ambulatory Visit (INDEPENDENT_AMBULATORY_CARE_PROVIDER_SITE_OTHER): Payer: Medicare HMO | Admitting: Family Medicine

## 2023-09-12 VITALS — BP 120/72 | HR 76 | Temp 98.6°F | Ht 61.0 in | Wt 143.0 lb

## 2023-09-12 DIAGNOSIS — E785 Hyperlipidemia, unspecified: Secondary | ICD-10-CM | POA: Diagnosis not present

## 2023-09-12 DIAGNOSIS — G3184 Mild cognitive impairment, so stated: Secondary | ICD-10-CM

## 2023-09-12 DIAGNOSIS — E119 Type 2 diabetes mellitus without complications: Secondary | ICD-10-CM

## 2023-09-12 DIAGNOSIS — M609 Myositis, unspecified: Secondary | ICD-10-CM | POA: Diagnosis not present

## 2023-09-12 DIAGNOSIS — Z23 Encounter for immunization: Secondary | ICD-10-CM

## 2023-09-12 DIAGNOSIS — Z780 Asymptomatic menopausal state: Secondary | ICD-10-CM

## 2023-09-12 DIAGNOSIS — M8580 Other specified disorders of bone density and structure, unspecified site: Secondary | ICD-10-CM

## 2023-09-12 DIAGNOSIS — T466X5A Adverse effect of antihyperlipidemic and antiarteriosclerotic drugs, initial encounter: Secondary | ICD-10-CM

## 2023-09-12 DIAGNOSIS — Z0001 Encounter for general adult medical examination with abnormal findings: Secondary | ICD-10-CM

## 2023-09-12 DIAGNOSIS — Z0000 Encounter for general adult medical examination without abnormal findings: Secondary | ICD-10-CM

## 2023-09-12 DIAGNOSIS — E1159 Type 2 diabetes mellitus with other circulatory complications: Secondary | ICD-10-CM | POA: Diagnosis not present

## 2023-09-12 DIAGNOSIS — E1169 Type 2 diabetes mellitus with other specified complication: Secondary | ICD-10-CM

## 2023-09-12 DIAGNOSIS — I152 Hypertension secondary to endocrine disorders: Secondary | ICD-10-CM

## 2023-09-12 LAB — LIPID PANEL

## 2023-09-12 LAB — BAYER DCA HB A1C WAIVED: HB A1C (BAYER DCA - WAIVED): 5 % (ref 4.8–5.6)

## 2023-09-12 MED ORDER — FENOFIBRATE 160 MG PO TABS: 160.0000 mg | ORAL_TABLET | Freq: Every day | ORAL | 3 refills | Status: DC

## 2023-09-12 MED ORDER — MONTELUKAST SODIUM 10 MG PO TABS: 10.0000 mg | ORAL_TABLET | Freq: Every day | ORAL | 3 refills | Status: DC

## 2023-09-12 MED ORDER — MEMANTINE HCL 5 MG PO TABS
5.0000 mg | ORAL_TABLET | Freq: Two times a day (BID) | ORAL | 3 refills | Status: AC
Start: 2023-09-12 — End: ?

## 2023-09-12 MED ORDER — OMEPRAZOLE 40 MG PO CPDR: 40.0000 mg | DELAYED_RELEASE_CAPSULE | Freq: Every day | ORAL | 3 refills | Status: DC

## 2023-09-12 MED ORDER — IRBESARTAN-HYDROCHLOROTHIAZIDE 150-12.5 MG PO TABS: 1.0000 | ORAL_TABLET | Freq: Every day | ORAL | 3 refills | Status: DC

## 2023-09-12 NOTE — Progress Notes (Signed)
Melissa Bowman is a 72 y.o. female presents to office today for annual physical exam examination.    Concerns today include: 1. None.  She reports she has lost 15lbs. Had EGD done. Started on pepcid.  No blood in stool. No night sweats, SOB, hemoptysis, abdominal pain, nausea or vomiting.  Reports some sinus drainage that is not relieved by Singulair, Clarinex, Flonase and Astelin.  Used to get allergy shots but doesn't want to see an allergist just yet.  Overall, feels well.  Totally off Abilify.  Continues to see Dr Adrian Blackwater and is taking Mirtazapine and Zoloft.  Doing well with this regimen  Occupation: retired, Marital status: married, Substance use: none Health Maintenance Due  Topic Date Due   OPHTHALMOLOGY EXAM  11/19/2022   INFLUENZA VACCINE  07/14/2023   COVID-19 Vaccine (4 - 2023-24 season) 08/14/2023   HEMOGLOBIN A1C  09/09/2023   Refills needed today: none  Immunization History  Administered Date(s) Administered   Fluad Quad(high Dose 65+) 10/30/2019, 09/02/2020, 12/01/2021   Influenza, High Dose Seasonal PF 10/06/2018   Influenza,inj,Quad PF,6+ Mos 09/28/2013, 09/27/2014, 10/15/2015, 11/03/2016   Moderna Sars-Covid-2 Vaccination 02/26/2020, 03/25/2020, 11/04/2020   Pneumococcal Conjugate-13 05/25/2018   Pneumococcal Polysaccharide-23 10/30/2019   Tdap 09/27/2014   Zoster Recombinant(Shingrix) 02/07/2019, 08/03/2021   Zoster, Live 01/31/2013   Past Medical History:  Diagnosis Date   Allergic rhinitis    Allergy to ertapenem    Anxiety 08/27/2011   Anxiety and depression    Asthmatic bronchitis    Bilateral carpal tunnel syndrome    Condyloma acuminata    Diverticulosis    Eczema    GERD (gastroesophageal reflux disease)    H. pylori infection 11/16/1993   Hiatal hernia    History of benign adrenal tumor 2007   [facility]   History of pneumonia 2007, 2008   Hyperlipidemia    Hypertension    IBS (irritable bowel syndrome)    Internal hemorrhoids    Kyphosis     Long term current use of antipsychotic medication 08/19/2022   Memory loss    Menopause    Metabolic syndrome    OSA on CPAP    Prolonged Q-T interval on ECG 08/19/2022   Type 2 diabetes mellitus (HCC)    Vitamin D deficiency    Social History   Socioeconomic History   Marital status: Married    Spouse name: [name]   Number of children: 2   Years of education: some college   Highest education level: Some college, no degree  Occupational History   Occupation: retired    Comment: daycare  Tobacco Use   Smoking status: Never   Smokeless tobacco: Never  Vaping Use   Vaping status: Never Used  Substance and Sexual Activity   Alcohol use: No   Drug use: No   Sexual activity: Not on file  Other Topics Concern   Not on file  Social History Narrative   Su is retired. She lives with her husband [name]. They have two grown daughters that live locally. Sumie enjoys reading and puzzles.       Social Determinants of Health   Financial Resource Strain: Low Risk  (04/25/2023)   Overall Financial Resource Strain (CARDIA)    Difficulty of Paying Living Expenses: Not hard at all  Food Insecurity: No Food Insecurity (04/25/2023)   Hunger Vital Sign    Worried About Running Out of Food in the Last Year: Never true    Ran Out of Food in the  Last Year: Never true  Transportation Needs: No Transportation Needs (04/25/2023)   PRAPARE - Administrator, Civil Service (Medical): No    Lack of Transportation (Non-Medical): No  Physical Activity: Insufficiently Active (04/25/2023)   Exercise Vital Sign    Days of Exercise per Week: 3 days    Minutes of Exercise per Session: 30 min  Stress: No Stress Concern Present (04/25/2023)   [employer] of Occupational Health - Occupational Stress Questionnaire    Feeling of Stress : Not at all  Social Connections: Unknown (04/25/2023)   Social Connection and Isolation Panel [NHANES]    Frequency of Communication with Friends and  Family: Patient unable to answer    Frequency of Social Gatherings with Friends and Family: Patient unable to answer    Attends Religious Services: Never    Database administrator or Organizations: No    Attends Banker Meetings: Never    Marital Status: Married  Catering manager Violence: Not At Risk (04/22/2021)   Humiliation, Afraid, Rape, and Kick questionnaire    Fear of Current or Ex-Partner: No    Emotionally Abused: No    Physically Abused: No    Sexually Abused: No   Past Surgical History:  Procedure Laterality Date   abdominal wall lematoma of fabdd hys.     APPENDECTOMY     CARPAL TUNNEL RELEASE     CESAREAN SECTION     X2   COLONOSCOPY     INCISIONAL HERNIA REPAIR     right breast biopsy  09/1995   ROTATOR CUFF REPAIR  2002   right   ROTATOR CUFF REPAIR/ bone spur  08/30/2009   SHOULDER ARTHROSCOPY WITH ROTATOR CUFF REPAIR AND SUBACROMIAL DECOMPRESSION Right 01/19/2018   Procedure: REVISION RIGHT MINI OPEN ROTATOR CUFF REPAIR AND SUBACROMIAL DECOMPRESSION;  Surgeon: Jene Every, MD;  Location: [facility];  Service: Orthopedics;  Laterality: Right;  90 MINS   sleep apnea surgery  04/1995   torn cartiledge rt knee x2  05/13/2006   Dr. Sherlean Foot [facility]oc    TOTAL ABDOMINAL HYSTERECTOMY W/ BILATERAL SALPINGOOPHORECTOMY  07/1993   fibroids    Family History  Problem Relation Age of Onset   Heart disease Mother    Parkinsonism Father    Emphysema Father    Hip fracture Father    Heart failure Sister    Ovarian cancer Sister    COPD Sister    Lymphoma Sister    COPD Sister    COPD Brother    Hypertension Brother    Heart disease Brother    COPD Brother    Colon cancer Neg Hx    Esophageal cancer Neg Hx    Pancreatic cancer Neg Hx    Stomach cancer Neg Hx    Breast cancer Neg Hx     Current Outpatient Medications:    Ascorbic Acid (VITAMIN C) 1000 MG tablet, Take 1,000 mg by mouth daily., Disp: , Rfl:    azelastine (ASTELIN) 0.1 % nasal spray, Place 1  spray into both nostrils 2 (two) times daily., Disp: 30 mL, Rfl: 12   Calcium Carb-Cholecalciferol 600-800 MG-UNIT TABS, Take 2 tablets by mouth daily., Disp: , Rfl:    Cholecalciferol (VITAMIN D-3) 5000 UNITS TABS, Take 5,000 Units by mouth daily., Disp: , Rfl:    famotidine (PEPCID) 20 MG tablet, Take 1 tablet (20 mg total) by mouth at bedtime., Disp: 90 tablet, Rfl: 1   fenofibrate 160 MG tablet, TAKE 1 TABLET BY MOUTH  EVERY DAY, Disp: 90 tablet, Rfl: 0   fluticasone (FLONASE) 50 MCG/ACT nasal spray, Use 2 sprays in each  nostril daily, Disp: 48 g, Rfl: 2   irbesartan-hydrochlorothiazide (AVALIDE) 150-12.5 MG tablet, TAKE 1 TABLET BY MOUTH EVERY DAY, Disp: 100 tablet, Rfl: 0   memantine (NAMENDA) 5 MG tablet, Take 1 tablet (5 mg total) by mouth 2 (two) times daily., Disp: 180 tablet, Rfl: 3   mirtazapine (REMERON) 45 MG tablet, Take 1 tablet (45 mg total) by mouth at bedtime., Disp: 90 tablet, Rfl: 1   montelukast (SINGULAIR) 10 MG tablet, Take 1 tablet (10 mg total) by mouth daily., Disp: 100 tablet, Rfl: 3   omeprazole (PRILOSEC) 40 MG capsule, TAKE 1 CAPSULE BY MOUTH DAILY, Disp: 100 capsule, Rfl: 1   PROAIR HFA 108 (90 BASE) MCG/ACT inhaler, Use 2 puffs into the lungs  every 6 hours as needed for wheezing or shortness of  breath, Disp: 25.5 g, Rfl: 2   sertraline (ZOLOFT) 50 MG tablet, Take 1 tablet (50 mg total) by mouth daily., Disp: 90 tablet, Rfl: 2  Current Facility-Administered Medications:    0.9 %  sodium chloride infusion, 500 mL, Intravenous, Once, Jenel Lucks, MD  Allergies  Allergen Reactions   Statins Other (See Comments)    myalgias   E-Mycin [Erythromycin] Other (See Comments)    Medication intolerance stomach cramps   Estrogens Other (See Comments)    Estrogen replacement Therapy intolerance mood changes   Sulfa Antibiotics Other (See Comments)    Causes severe stomach cramps    Vioxx [Rofecoxib] Other (See Comments)    Unknown reaction/interaction. Possible  stomach cramps     ROS: Review of Systems Pertinent items noted in HPI and remainder of comprehensive ROS otherwise negative.    Physical exam BP 120/72   Pulse 76   Temp 98.6 F (37 C)   Ht 5\' 1"  (1.549 m)   Wt 143 lb (64.9 kg)   SpO2 99%   BMI 27.02 kg/m  General appearance: alert, cooperative, appears stated age, and no distress Head: Normocephalic, without obvious abnormality, atraumatic Eyes: negative findings: lids and lashes normal, conjunctivae and sclerae normal, corneas clear, pupils equal, round, reactive to light and accomodation, and wears glasses Ears: normal TM's and external ear canals both ears Nose: Nares normal. Septum midline. Mucosa normal. No drainage or sinus tenderness. Throat: lips, mucosa, and tongue normal; teeth and gums normal Neck: no adenopathy, supple, symmetrical, trachea midline, and thyroid not enlarged, symmetric, no tenderness/mass/nodules Back:  increased kyphosis of thoracic spine present. Ambulates independently Lungs: clear to auscultation bilaterally Heart: regular rate and rhythm, S1, S2 normal, no murmur, click, rub or gallop Abdomen:  mild epigastric TTP. No rebound/ guarding/ masses palpable Extremities: extremities normal, atraumatic, no cyanosis or edema Pulses: 2+ and symmetric Skin: Skin color, texture, turgor normal. No rashes or lesions Lymph nodes: Cervical, supraclavicular, and axillary nodes normal. Neurologic: fine jaw tremor appreciated. Hard of hearing (forgot hearing aids today)      09/12/2023    9:16 AM 04/25/2023    3:44 PM 04/18/2023    9:54 AM  Depression screen PHQ 2/9  Decreased Interest 0 0 0  Down, Depressed, Hopeless 0 0 0  PHQ - 2 Score 0 0 0  Altered sleeping 0 0 0  Tired, decreased energy 0 0 0  Change in appetite 0 0 0  Feeling bad or failure about yourself  0 0 0  Trouble concentrating 0 0 0  Moving slowly or  fidgety/restless 0 0 0  Suicidal thoughts 0 0 0  PHQ-9 Score 0 0 0  Difficult doing  work/chores Not difficult at all Not difficult at all Not difficult at all      09/12/2023    9:16 AM 04/18/2023    9:54 AM 03/09/2023    9:25 AM 02/01/2023   11:10 AM  GAD 7 : Generalized Anxiety Score  Nervous, Anxious, on Edge 0 0 0 0  Control/stop worrying 0 0 0 0  Worry too much - different things 0 0 0 0  Trouble relaxing 0 0 0 0  Restless 0 0 0 0  Easily annoyed or irritable 0 0 0 0  Afraid - awful might happen 0 0 0 0  Total GAD 7 Score 0 0 0 0  Anxiety Difficulty Not difficult at all Not difficult at all Not difficult at all Not difficult at all     Assessment/ Plan: [name] here for annual physical exam.   Annual physical exam  Diet-controlled diabetes mellitus (HCC) - Plan: Bayer DCA Hb A1c Waived, CMP14+EGFR, CBC, Microalbumin / creatinine urine ratio  Hyperlipidemia associated with type 2 diabetes mellitus (HCC) - Plan: CMP14+EGFR, TSH, Lipid Panel  Statin-induced myositis - Plan: CMP14+EGFR  Hypertension associated with diabetes (HCC) - Plan: CMP14+EGFR  Osteopenia after menopause - Plan: VITAMIN D 25 Hydroxy (Vit-D Deficiency, Fractures)  Mild cognitive impairment - Plan: memantine (NAMENDA) 5 MG tablet  Encounter for immunization - Plan: Flu Vaccine Trivalent High Dose (Fluad)  Sugar remains diet controlled with A1c 5.0. ROI for DM eye exam completed.   Suspect weight loss due to stopping Abilify.  No B symptoms.  Had EGD which showed no malignancy.  Doing well on SSRI and Tetracyclic AD. No cardiac symptoms.  Will plan for yearly EKG in Feb and cc to psychiatrist.  Fasting lipid collected. Continue fenofibrate given statin intolerance.  BP well controlled. Meds renewed.  Check Vit D. Dexa due 06/2024.  MCI stable. Namenda renewed.  Flu shot administered.  Counseled on healthy lifestyle choices, including diet (rich in fruits, vegetables and lean meats and low in salt and simple carbohydrates) and exercise (at least 30 minutes of moderate  physical activity daily).  Patient to follow up 58m for DM/ EKG/ foot exam  Shanen Norris M. Nadine Counts, DO

## 2023-09-13 LAB — CMP14+EGFR
ALT: 12 IU/L (ref 0–32)
AST: 15 IU/L (ref 0–40)
Albumin: 4.4 g/dL (ref 3.8–4.8)
Alkaline Phosphatase: 31 IU/L — ABNORMAL LOW (ref 44–121)
BUN/Creatinine Ratio: 21 (ref 12–28)
BUN: 24 mg/dL (ref 8–27)
Bilirubin Total: 0.2 mg/dL (ref 0.0–1.2)
CO2: 21 mmol/L (ref 20–29)
Calcium: 9.9 mg/dL (ref 8.7–10.3)
Chloride: 98 mmol/L (ref 96–106)
Creatinine, Ser: 1.16 mg/dL — ABNORMAL HIGH (ref 0.57–1.00)
Globulin, Total: 1.8 g/dL (ref 1.5–4.5)
Glucose: 106 mg/dL — ABNORMAL HIGH (ref 70–99)
Potassium: 3.8 mmol/L (ref 3.5–5.2)
Sodium: 135 mmol/L (ref 134–144)
Total Protein: 6.2 g/dL (ref 6.0–8.5)
eGFR: 50 mL/min/{1.73_m2} — ABNORMAL LOW (ref >59)

## 2023-09-13 LAB — CBC
Hematocrit: 32 % — ABNORMAL LOW (ref 34.0–46.6)
Hemoglobin: 10.8 g/dL — ABNORMAL LOW (ref 11.1–15.9)
MCH: 31.1 pg (ref 26.6–33.0)
MCHC: 33.8 g/dL (ref 31.5–35.7)
MCV: 92 fL (ref 79–97)
Platelets: 342 10*3/uL (ref 150–450)
RBC: 3.47 x10E6/uL — ABNORMAL LOW (ref 3.77–5.28)
RDW: 13 % (ref 11.7–15.4)
WBC: 4.5 10*3/uL (ref 3.4–10.8)

## 2023-09-13 LAB — LIPID PANEL
Cholesterol, Total: 218 mg/dL — ABNORMAL HIGH (ref 100–199)
HDL: 36 mg/dL — ABNORMAL LOW (ref >39)
LDL CALC COMMENT:: 6.1 ratio — ABNORMAL HIGH (ref 0.0–4.4)
LDL Chol Calc (NIH): 143 mg/dL — ABNORMAL HIGH (ref 0–99)
Triglycerides: 212 mg/dL — ABNORMAL HIGH (ref 0–149)
VLDL Cholesterol Cal: 39 mg/dL (ref 5–40)

## 2023-09-13 LAB — TSH: TSH: 1.91 u[IU]/mL (ref 0.450–4.500)

## 2023-09-13 LAB — MICROALBUMIN / CREATININE URINE RATIO
Creatinine, Urine: 36.3 mg/dL
Microalb/Creat Ratio: <8 mg/g{creat} (ref 0–29)
Microalbumin, Urine: <3 ug/mL

## 2023-09-13 LAB — VITAMIN D 25 HYDROXY (VIT D DEFICIENCY, FRACTURES): Vit D, 25-Hydroxy: 87.8 ng/mL (ref 30.0–100.0)

## 2023-09-25 DIAGNOSIS — G4733 Obstructive sleep apnea (adult) (pediatric): Secondary | ICD-10-CM | POA: Diagnosis not present

## 2023-09-28 ENCOUNTER — Other Ambulatory Visit: Payer: Self-pay

## 2023-09-28 DIAGNOSIS — D649 Anemia, unspecified: Secondary | ICD-10-CM

## 2023-09-28 DIAGNOSIS — E1169 Type 2 diabetes mellitus with other specified complication: Secondary | ICD-10-CM

## 2023-09-29 ENCOUNTER — Other Ambulatory Visit: Payer: Self-pay | Admitting: *Deleted

## 2023-09-29 ENCOUNTER — Other Ambulatory Visit: Payer: Medicare HMO

## 2023-09-29 ENCOUNTER — Ambulatory Visit: Payer: Medicare HMO | Admitting: Physician Assistant

## 2023-09-29 DIAGNOSIS — E1169 Type 2 diabetes mellitus with other specified complication: Secondary | ICD-10-CM | POA: Diagnosis not present

## 2023-09-29 DIAGNOSIS — E785 Hyperlipidemia, unspecified: Secondary | ICD-10-CM | POA: Diagnosis not present

## 2023-09-29 DIAGNOSIS — D649 Anemia, unspecified: Secondary | ICD-10-CM | POA: Diagnosis not present

## 2023-09-29 DIAGNOSIS — N289 Disorder of kidney and ureter, unspecified: Secondary | ICD-10-CM

## 2023-09-29 LAB — ANEMIA PROFILE B
Basophils Absolute: 0 10*3/uL (ref 0.0–0.2)
Basos: 1 %
EOS (ABSOLUTE): 0.1 10*3/uL (ref 0.0–0.4)
Eos: 2 %
Ferritin: 191 ng/mL — ABNORMAL HIGH (ref 15–150)
Folate: 5.4 ng/mL (ref >3.0)
Hematocrit: 32.9 % — ABNORMAL LOW (ref 34.0–46.6)
Hemoglobin: 11.3 g/dL (ref 11.1–15.9)
Immature Grans (Abs): 0 10*3/uL (ref 0.0–0.1)
Immature Granulocytes: 0 %
Iron Saturation: 20 % (ref 15–55)
Iron: 98 ug/dL (ref 27–139)
Lymphocytes Absolute: 2.2 10*3/uL (ref 0.7–3.1)
Lymphs: 47 %
MCH: 31.2 pg (ref 26.6–33.0)
MCHC: 34.3 g/dL (ref 31.5–35.7)
MCV: 91 fL (ref 79–97)
Monocytes Absolute: 0.3 10*3/uL (ref 0.1–0.9)
Monocytes: 5 %
Neutrophils Absolute: 2.1 10*3/uL (ref 1.4–7.0)
Neutrophils: 45 %
Platelets: 360 10*3/uL (ref 150–450)
RBC: 3.62 x10E6/uL — ABNORMAL LOW (ref 3.77–5.28)
RDW: 12.6 % (ref 11.7–15.4)
Retic Ct Pct: 1.3 % (ref 0.6–2.6)
Total Iron Binding Capacity: 479 ug/dL — ABNORMAL HIGH (ref 250–450)
UIBC: 381 ug/dL — ABNORMAL HIGH (ref 118–369)
Vitamin B-12: 288 pg/mL (ref 232–1245)
WBC: 4.8 10*3/uL (ref 3.4–10.8)

## 2023-09-29 LAB — BMP8+EGFR
BUN/Creatinine Ratio: 20 (ref 12–28)
BUN: 25 mg/dL (ref 8–27)
CO2: 23 mmol/L (ref 20–29)
Calcium: 10.1 mg/dL (ref 8.7–10.3)
Chloride: 99 mmol/L (ref 96–106)
Creatinine, Ser: 1.22 mg/dL — ABNORMAL HIGH (ref 0.57–1.00)
Glucose: 89 mg/dL (ref 70–99)
Potassium: 4.3 mmol/L (ref 3.5–5.2)
Sodium: 137 mmol/L (ref 134–144)
eGFR: 47 mL/min/{1.73_m2} — ABNORMAL LOW (ref >59)

## 2023-09-30 ENCOUNTER — Other Ambulatory Visit: Payer: Medicare HMO

## 2023-09-30 DIAGNOSIS — D649 Anemia, unspecified: Secondary | ICD-10-CM

## 2023-10-01 LAB — FECAL OCCULT BLOOD, IMMUNOCHEMICAL: Fecal Occult Bld: NEGATIVE

## 2023-10-06 ENCOUNTER — Other Ambulatory Visit: Payer: Medicare HMO

## 2023-10-06 DIAGNOSIS — N289 Disorder of kidney and ureter, unspecified: Secondary | ICD-10-CM | POA: Diagnosis not present

## 2023-10-07 LAB — BMP8+EGFR
BUN/Creatinine Ratio: 20 (ref 12–28)
BUN: 24 mg/dL (ref 8–27)
CO2: 24 mmol/L (ref 20–29)
Calcium: 10 mg/dL (ref 8.7–10.3)
Chloride: 97 mmol/L (ref 96–106)
Creatinine, Ser: 1.23 mg/dL — ABNORMAL HIGH (ref 0.57–1.00)
Glucose: 95 mg/dL (ref 70–99)
Potassium: 4.3 mmol/L (ref 3.5–5.2)
Sodium: 137 mmol/L (ref 134–144)
eGFR: 46 mL/min/{1.73_m2} — ABNORMAL LOW (ref >59)

## 2023-10-21 ENCOUNTER — Other Ambulatory Visit: Payer: Medicare HMO

## 2023-10-21 DIAGNOSIS — N289 Disorder of kidney and ureter, unspecified: Secondary | ICD-10-CM | POA: Diagnosis not present

## 2023-10-21 LAB — BMP8+EGFR
BUN/Creatinine Ratio: 18 (ref 12–28)
BUN: 24 mg/dL (ref 8–27)
CO2: 23 mmol/L (ref 20–29)
Calcium: 9.9 mg/dL (ref 8.7–10.3)
Chloride: 96 mmol/L (ref 96–106)
Creatinine, Ser: 1.33 mg/dL — ABNORMAL HIGH (ref 0.57–1.00)
Glucose: 93 mg/dL (ref 70–99)
Potassium: 4.3 mmol/L (ref 3.5–5.2)
Sodium: 133 mmol/L — ABNORMAL LOW (ref 134–144)
eGFR: 42 mL/min/1.73 — ABNORMAL LOW

## 2023-10-24 NOTE — Telephone Encounter (Signed)
Her sodium is low

## 2023-10-26 DIAGNOSIS — G4733 Obstructive sleep apnea (adult) (pediatric): Secondary | ICD-10-CM | POA: Diagnosis not present

## 2023-11-06 ENCOUNTER — Other Ambulatory Visit: Payer: Self-pay | Admitting: Family Medicine

## 2023-11-09 ENCOUNTER — Other Ambulatory Visit: Payer: Self-pay | Admitting: Family Medicine

## 2023-11-09 NOTE — Telephone Encounter (Signed)
Her GI specialist said TWICE DAILY PANTOPRAZOLE AND FAMOTIDINE.  She is requesting Omeprazole (which she is already on max dose of).  I can change it back to pantoprazole.  Is that what she wants to do?

## 2023-11-14 ENCOUNTER — Encounter: Payer: Self-pay | Admitting: Family Medicine

## 2023-11-14 NOTE — Telephone Encounter (Signed)
Kelci, did you see my message from before?

## 2023-11-18 DIAGNOSIS — R634 Abnormal weight loss: Secondary | ICD-10-CM

## 2023-11-18 MED ORDER — PANTOPRAZOLE SODIUM 40 MG PO TBEC: 40.0000 mg | DELAYED_RELEASE_TABLET | Freq: Two times a day (BID) | ORAL | 1 refills | Status: DC

## 2023-11-18 NOTE — Telephone Encounter (Signed)
He has no appts available other than 7 day holds.

## 2023-11-22 NOTE — Addendum Note (Signed)
Addended by: Loretha Stapler on: 11/22/2023 01:11 PM   Modules accepted: Orders

## 2023-11-28 ENCOUNTER — Ambulatory Visit (HOSPITAL_COMMUNITY): Payer: Medicare HMO

## 2023-11-28 ENCOUNTER — Telehealth: Payer: Self-pay | Admitting: Gastroenterology

## 2023-11-28 NOTE — Telephone Encounter (Signed)
Hello this pt's CT [address] and [phone] was unable to be approved by the medical director due to the following reasons:  For 24401 Computed Tomography (CT), a special kind of picture of your chest with  contrast (dye) Your doctor told us that you have weight loss and the cause is unclear. The  request cannot be approved because: Imaging can be done when results of a thorough workup (such as hormone levels, stool  testing, and a detailed nervous system exam) fail to reveal the cause of your weight loss.  The notes sent to [place] do not describe the results of a thorough workup that support this  imaging. This finding was based on review of Medicare National Coverage Determinations  (NCD): 220.1 Computed Tomography and [facility] Oncology Imaging Guidelines  Section(s): Unexplained Weight Loss (ONC 30.2).  For 02725 Computed Tomography (CT), a special kind of picture of your abdomen  (stomach area) and pelvis with contrast (dye) Your doctor told us that you have weight  loss and the cause is unclear. The request cannot be approved because: Imaging can be done when results of a thorough workup (such as hormone levels, stool  testing, and a detailed nervous system exam) fail to reveal the cause of your weight loss.  The notes sent to [place] do not describe the results of a thorough workup that support this  imaging. This finding was based on review of Medicare National Coverage Determinations  (NCD): 220.1 Computed Tomography and [facility] Oncology Imaging Guidelines  Section(s): Unexplained Weight Loss (ONC 30.2).  Please advise if a peer to peer review is necessary to schedule one.

## 2023-11-28 NOTE — Telephone Encounter (Signed)
See message regarding denial of CT scan

## 2023-12-01 NOTE — Telephone Encounter (Signed)
I spoke with a clinical nurse with [employer] and she informed me that the pt needs to have a complete lab workup including CBC, CMP, A1C, Stool studies and recent endocrine evaluation. I was informed any of the clinical staff can call and ask to speak with a nurse to provide more information.  CB [phone] Opt 4 Say "[employer]" Opt 1 Opt 3

## 2023-12-05 ENCOUNTER — Ambulatory Visit (HOSPITAL_COMMUNITY): Payer: Medicare HMO

## 2023-12-13 ENCOUNTER — Other Ambulatory Visit: Payer: Self-pay | Admitting: Family Medicine

## 2023-12-16 NOTE — Telephone Encounter (Signed)
 It looks like the case was denied by [employer], I will have to initiate an appeal through the healthplan directly.

## 2023-12-20 ENCOUNTER — Ambulatory Visit (HOSPITAL_COMMUNITY)
Admission: RE | Admit: 2023-12-20 | Discharge: 2023-12-20 | Disposition: A | Discharge status: Home / Self Care | Payer: Medicare HMO | Source: Ambulatory Visit | Attending: Gastroenterology | Admitting: Gastroenterology

## 2023-12-20 DIAGNOSIS — K8020 Calculus of gallbladder without cholecystitis without obstruction: Secondary | ICD-10-CM | POA: Insufficient documentation

## 2023-12-20 DIAGNOSIS — I700 Atherosclerosis of aorta: Secondary | ICD-10-CM | POA: Insufficient documentation

## 2023-12-20 DIAGNOSIS — R634 Abnormal weight loss: Secondary | ICD-10-CM | POA: Diagnosis not present

## 2023-12-20 MED ORDER — IOHEXOL 300 MG/ML  SOLN
100.0000 mL | Freq: Once | INTRAMUSCULAR | Status: AC | PRN
  Administered 2023-12-20: 100 mL via INTRAVENOUS

## 2023-12-21 ENCOUNTER — Encounter: Payer: Self-pay | Admitting: Family Medicine

## 2023-12-21 DIAGNOSIS — F330 Major depressive disorder, recurrent, mild: Secondary | ICD-10-CM

## 2023-12-21 DIAGNOSIS — S42332A Displaced oblique fracture of shaft of humerus, left arm, initial encounter for closed fracture: Secondary | ICD-10-CM | POA: Diagnosis not present

## 2023-12-21 DIAGNOSIS — M79602 Pain in left arm: Secondary | ICD-10-CM | POA: Insufficient documentation

## 2023-12-21 DIAGNOSIS — G4700 Insomnia, unspecified: Secondary | ICD-10-CM

## 2023-12-22 DIAGNOSIS — S42302A Unspecified fracture of shaft of humerus, left arm, initial encounter for closed fracture: Secondary | ICD-10-CM | POA: Diagnosis not present

## 2023-12-22 DIAGNOSIS — S42309A Unspecified fracture of shaft of humerus, unspecified arm, initial encounter for closed fracture: Secondary | ICD-10-CM | POA: Insufficient documentation

## 2023-12-22 MED ORDER — MIRTAZAPINE 45 MG PO TABS: 45.0000 mg | ORAL_TABLET | Freq: Every day | ORAL | 0 refills | Status: DC

## 2023-12-23 ENCOUNTER — Encounter (HOSPITAL_COMMUNITY): Payer: Self-pay | Admitting: Orthopedic Surgery

## 2023-12-23 NOTE — Progress Notes (Signed)
 SDW call  Patient's husband, Jenene was given pre-op instructions over the phone. He verbalized understanding of instructions provided.     PCP - Dr. Norene Fielding Cardiologist - Dr. Jayson Sierras Pulmonary:    PPM/ICD - denies Device Orders - na Rep Notified - na   Chest x-ray - 03/04/2023 EKG -  02/01/2023 Stress Test - ECHO - 04/16/2021 Cardiac Cath -   Sleep Study/sleep apnea/CPAP: OSA with CPAP  Type II diabetic, diet controlled Fasting Blood sugar range: less than 120 How often check sugars: twice a day   Blood Thinner Instructions: denies Aspirin  Instructions:denies   ERAS Protcol - Clears until 1330   Anesthesia review: Yes. HTN, DM, OSA with CPAP, prolonged QT   Patient denies shortness of breath, fever, cough and chest pain over the phone call  Your procedure is scheduled on Monday December 26, 2023  Report to [facility] Main Entrance A at 1400  A.M., then check in with the Admitting office.  Call this number if you have problems the morning of surgery:  [phone]   If you have any questions prior to your surgery date call [phone]: Open Monday-Friday 8am-4pm If you experience any cold or flu symptoms such as cough, fever, chills, shortness of breath, etc. between now and your scheduled surgery, please notify us  at the above number    Remember:  Do not eat after midnight the night before your surgery  You may drink clear liquids until 1330    the morning of your surgery.   Clear liquids allowed are: Water, Non-Citrus Juices (without pulp), Carbonated Beverages, Clear Tea, Black Coffee ONLY (NO MILK, CREAM OR POWDERED CREAMER of any kind), and Gatorade   Take these medicines the morning of surgery with A SIP OF WATER:  Flonase , fenofibrate , memantine , singulair , protonix , zoloft   As needed: Astelin , proair   As of today, STOP taking any Aspirin  (unless otherwise instructed by your surgeon) Aleve, Naproxen, Ibuprofen, Motrin, Advil, Goody's, BC's,  all herbal medications, fish oil, and all vitamins.

## 2023-12-26 ENCOUNTER — Encounter (HOSPITAL_COMMUNITY): Payer: Self-pay | Admitting: Orthopedic Surgery

## 2023-12-26 ENCOUNTER — Ambulatory Visit (HOSPITAL_COMMUNITY): Payer: Self-pay | Admitting: Physician Assistant

## 2023-12-26 ENCOUNTER — Ambulatory Visit (HOSPITAL_COMMUNITY): Payer: Medicare HMO

## 2023-12-26 ENCOUNTER — Encounter (HOSPITAL_COMMUNITY)
Admission: RE | Disposition: A | Discharge status: Home / Self Care | Payer: Self-pay | Source: Home / Self Care | Attending: Orthopedic Surgery

## 2023-12-26 ENCOUNTER — Ambulatory Visit (HOSPITAL_COMMUNITY)
Admission: RE | Admit: 2023-12-26 | Discharge: 2023-12-26 | Disposition: A | Discharge status: Home / Self Care | Payer: Medicare HMO | Attending: Orthopedic Surgery | Admitting: Orthopedic Surgery

## 2023-12-26 DIAGNOSIS — S42302A Unspecified fracture of shaft of humerus, left arm, initial encounter for closed fracture: Secondary | ICD-10-CM | POA: Diagnosis not present

## 2023-12-26 DIAGNOSIS — E039 Hypothyroidism, unspecified: Secondary | ICD-10-CM | POA: Diagnosis not present

## 2023-12-26 DIAGNOSIS — S42202A Unspecified fracture of upper end of left humerus, initial encounter for closed fracture: Secondary | ICD-10-CM | POA: Insufficient documentation

## 2023-12-26 DIAGNOSIS — E119 Type 2 diabetes mellitus without complications: Secondary | ICD-10-CM | POA: Insufficient documentation

## 2023-12-26 DIAGNOSIS — F419 Anxiety disorder, unspecified: Secondary | ICD-10-CM | POA: Diagnosis not present

## 2023-12-26 DIAGNOSIS — I10 Essential (primary) hypertension: Secondary | ICD-10-CM | POA: Diagnosis not present

## 2023-12-26 DIAGNOSIS — F418 Other specified anxiety disorders: Secondary | ICD-10-CM

## 2023-12-26 DIAGNOSIS — Z0189 Encounter for other specified special examinations: Secondary | ICD-10-CM | POA: Diagnosis not present

## 2023-12-26 DIAGNOSIS — F32A Depression, unspecified: Secondary | ICD-10-CM | POA: Insufficient documentation

## 2023-12-26 DIAGNOSIS — K219 Gastro-esophageal reflux disease without esophagitis: Secondary | ICD-10-CM | POA: Insufficient documentation

## 2023-12-26 DIAGNOSIS — G4733 Obstructive sleep apnea (adult) (pediatric): Secondary | ICD-10-CM | POA: Diagnosis not present

## 2023-12-26 DIAGNOSIS — X58XXXA Exposure to other specified factors, initial encounter: Secondary | ICD-10-CM | POA: Diagnosis not present

## 2023-12-26 DIAGNOSIS — G8918 Other acute postprocedural pain: Secondary | ICD-10-CM | POA: Diagnosis not present

## 2023-12-26 DIAGNOSIS — S42292A Other displaced fracture of upper end of left humerus, initial encounter for closed fracture: Secondary | ICD-10-CM | POA: Diagnosis not present

## 2023-12-26 HISTORY — PX: ORIF HUMERUS FRACTURE: SHX2126

## 2023-12-26 LAB — BASIC METABOLIC PANEL
Anion gap: 15 (ref 5–15)
BUN: 26 mg/dL — ABNORMAL HIGH (ref 8–23)
CO2: 24 mmol/L (ref 22–32)
Calcium: 9.9 mg/dL (ref 8.9–10.3)
Chloride: 101 mmol/L (ref 98–111)
Creatinine, Ser: 1.24 mg/dL — ABNORMAL HIGH (ref 0.44–1.00)
GFR, Estimated: 46 mL/min — ABNORMAL LOW (ref >60)
Glucose, Bld: 109 mg/dL — ABNORMAL HIGH (ref 70–99)
Potassium: 3.9 mmol/L (ref 3.5–5.1)
Sodium: 140 mmol/L (ref 135–145)

## 2023-12-26 LAB — CBC
HCT: 33.1 % — ABNORMAL LOW (ref 36.0–46.0)
Hemoglobin: 10.9 g/dL — ABNORMAL LOW (ref 12.0–15.0)
MCH: 29.9 pg (ref 26.0–34.0)
MCHC: 32.9 g/dL (ref 30.0–36.0)
MCV: 90.9 fL (ref 80.0–100.0)
Platelets: 427 10*3/uL — ABNORMAL HIGH (ref 150–400)
RBC: 3.64 MIL/uL — ABNORMAL LOW (ref 3.87–5.11)
RDW: 13.9 % (ref 11.5–15.5)
WBC: 6.2 10*3/uL (ref 4.0–10.5)
nRBC: 0 % (ref 0.0–0.2)

## 2023-12-26 LAB — GLUCOSE, CAPILLARY: Glucose-Capillary: 100 mg/dL — ABNORMAL HIGH (ref 70–99)

## 2023-12-26 LAB — POCT I-STAT CREATININE: Creatinine, Ser: 1.1 mg/dL — ABNORMAL HIGH (ref 0.44–1.00)

## 2023-12-26 SURGERY — OPEN REDUCTION INTERNAL FIXATION (ORIF) PROXIMAL HUMERUS FRACTURE
Anesthesia: General | Laterality: Left

## 2023-12-26 MED ORDER — ONDANSETRON 4 MG PO TBDP: 4.0000 mg | ORAL_TABLET | Freq: Three times a day (TID) | ORAL | 0 refills | Status: AC | PRN

## 2023-12-26 MED ORDER — LIDOCAINE HCL (PF) 2 % IJ SOLN
INTRAMUSCULAR | Status: DC | PRN
  Administered 2023-12-26: 70 mg via INTRADERMAL

## 2023-12-26 MED ORDER — GLYCOPYRROLATE PF 0.2 MG/ML IJ SOSY
PREFILLED_SYRINGE | INTRAMUSCULAR | Status: AC
  Filled 2023-12-26: qty 1

## 2023-12-26 MED ORDER — FENTANYL CITRATE PF 50 MCG/ML IJ SOSY
50.0000 ug | PREFILLED_SYRINGE | Freq: Once | INTRAMUSCULAR | Status: AC
  Administered 2023-12-26: 50 ug via INTRAVENOUS

## 2023-12-26 MED ORDER — TRANEXAMIC ACID-NACL 1000-0.7 MG/100ML-% IV SOLN
1000.0000 mg | INTRAVENOUS | Status: AC
  Administered 2023-12-26: 1000 mg via INTRAVENOUS
  Filled 2023-12-26: qty 100

## 2023-12-26 MED ORDER — FENTANYL CITRATE (PF) 100 MCG/2ML IJ SOLN
INTRAMUSCULAR | Status: AC
  Filled 2023-12-26: qty 2

## 2023-12-26 MED ORDER — CHLORHEXIDINE GLUCONATE 0.12 % MT SOLN
15.0000 mL | OROMUCOSAL | Status: AC
  Administered 2023-12-26: 15 mL via OROMUCOSAL
  Filled 2023-12-26: qty 15

## 2023-12-26 MED ORDER — SUGAMMADEX SODIUM 200 MG/2ML IV SOLN
INTRAVENOUS | Status: DC | PRN
  Administered 2023-12-26: 120 mg via INTRAVENOUS

## 2023-12-26 MED ORDER — DEXAMETHASONE SODIUM PHOSPHATE 10 MG/ML IJ SOLN
INTRAMUSCULAR | Status: AC
  Filled 2023-12-26: qty 1

## 2023-12-26 MED ORDER — FENTANYL CITRATE (PF) 250 MCG/5ML IJ SOLN
INTRAMUSCULAR | Status: AC
  Filled 2023-12-26: qty 5

## 2023-12-26 MED ORDER — VANCOMYCIN HCL 1000 MG IV SOLR
INTRAVENOUS | Status: DC | PRN
  Administered 2023-12-26: 1000 mg via TOPICAL

## 2023-12-26 MED ORDER — EPHEDRINE 5 MG/ML INJ
INTRAVENOUS | Status: AC
Start: 2023-12-26 — End: ?
  Filled 2023-12-26: qty 5

## 2023-12-26 MED ORDER — EPHEDRINE SULFATE-NACL 50-0.9 MG/10ML-% IV SOSY
PREFILLED_SYRINGE | INTRAVENOUS | Status: DC | PRN
  Administered 2023-12-26 (×2): 10 mg via INTRAVENOUS

## 2023-12-26 MED ORDER — LIDOCAINE 2% (20 MG/ML) 5 ML SYRINGE
INTRAMUSCULAR | Status: AC
  Filled 2023-12-26: qty 5

## 2023-12-26 MED ORDER — BUPIVACAINE HCL (PF) 0.5 % IJ SOLN
INTRAMUSCULAR | Status: DC | PRN
  Administered 2023-12-26: 10 mL via PERINEURAL

## 2023-12-26 MED ORDER — BUPIVACAINE LIPOSOME 1.3 % IJ SUSP
INTRAMUSCULAR | Status: DC | PRN
  Administered 2023-12-26: 10 mL via PERINEURAL

## 2023-12-26 MED ORDER — VANCOMYCIN HCL 1000 MG IV SOLR
INTRAVENOUS | Status: AC
  Filled 2023-12-26: qty 20

## 2023-12-26 MED ORDER — ONDANSETRON HCL 4 MG/2ML IJ SOLN
INTRAMUSCULAR | Status: DC | PRN
  Administered 2023-12-26: 4 mg via INTRAVENOUS

## 2023-12-26 MED ORDER — ROCURONIUM BROMIDE 10 MG/ML (PF) SYRINGE
PREFILLED_SYRINGE | INTRAVENOUS | Status: AC
  Filled 2023-12-26: qty 10

## 2023-12-26 MED ORDER — OXYCODONE-ACETAMINOPHEN 5-325 MG PO TABS: 1.0000 | ORAL_TABLET | Freq: Four times a day (QID) | ORAL | 0 refills | Status: DC | PRN

## 2023-12-26 MED ORDER — PROPOFOL 10 MG/ML IV BOLUS
INTRAVENOUS | Status: DC | PRN
  Administered 2023-12-26: 90 mg via INTRAVENOUS

## 2023-12-26 MED ORDER — ONDANSETRON HCL 4 MG/2ML IJ SOLN
INTRAMUSCULAR | Status: AC
  Filled 2023-12-26: qty 2

## 2023-12-26 MED ORDER — LACTATED RINGERS IV SOLN: INTRAVENOUS | Status: DC | PRN

## 2023-12-26 MED ORDER — GLYCOPYRROLATE 0.2 MG/ML IJ SOLN
INTRAMUSCULAR | Status: DC | PRN
  Administered 2023-12-26: .2 mg via INTRAVENOUS

## 2023-12-26 MED ORDER — PHENYLEPHRINE 80 MCG/ML (10ML) SYRINGE FOR IV PUSH (FOR BLOOD PRESSURE SUPPORT)
PREFILLED_SYRINGE | INTRAVENOUS | Status: AC
  Filled 2023-12-26: qty 10

## 2023-12-26 MED ORDER — CEFAZOLIN SODIUM-DEXTROSE 2-4 GM/100ML-% IV SOLN
2.0000 g | INTRAVENOUS | Status: AC
  Administered 2023-12-26: 2 g via INTRAVENOUS
  Filled 2023-12-26: qty 100

## 2023-12-26 MED ORDER — PHENYLEPHRINE 80 MCG/ML (10ML) SYRINGE FOR IV PUSH (FOR BLOOD PRESSURE SUPPORT)
PREFILLED_SYRINGE | INTRAVENOUS | Status: DC | PRN
  Administered 2023-12-26: 240 ug via INTRAVENOUS
  Administered 2023-12-26: 160 ug via INTRAVENOUS

## 2023-12-26 MED ORDER — 0.9 % SODIUM CHLORIDE (POUR BTL) OPTIME
TOPICAL | Status: DC | PRN
  Administered 2023-12-26: 1000 mL

## 2023-12-26 MED ORDER — ROCURONIUM BROMIDE 10 MG/ML (PF) SYRINGE
PREFILLED_SYRINGE | INTRAVENOUS | Status: DC | PRN
  Administered 2023-12-26: 50 mg via INTRAVENOUS

## 2023-12-26 MED ORDER — FENTANYL CITRATE (PF) 250 MCG/5ML IJ SOLN
INTRAMUSCULAR | Status: DC | PRN
  Administered 2023-12-26: 50 ug via INTRAVENOUS

## 2023-12-26 MED ORDER — DEXAMETHASONE SODIUM PHOSPHATE 10 MG/ML IJ SOLN
INTRAMUSCULAR | Status: DC | PRN
  Administered 2023-12-26: 10 mg via INTRAVENOUS

## 2023-12-26 MED ORDER — PHENYLEPHRINE HCL-NACL 20-0.9 MG/250ML-% IV SOLN
INTRAVENOUS | Status: DC | PRN
  Administered 2023-12-26: 50 ug/min via INTRAVENOUS

## 2023-12-26 SURGICAL SUPPLY — 43 items
BAG COUNTER SPONGE SURGICOUNT (BAG) ×1 IMPLANT
BIT DRILL 2.5 X LONG (BIT) ×1 IMPLANT
BIT DRILL LONG 2.7 (BIT) IMPLANT
BIT DRILL QC 2X125 (BIT) IMPLANT
BIT DRILL X LONG 2.5 (BIT) IMPLANT
CLSR STERI-STRIP ANTIMIC 1/2X4 (GAUZE/BANDAGES/DRESSINGS) IMPLANT
COVER SURGICAL LIGHT HANDLE (MISCELLANEOUS) ×1 IMPLANT
DRAPE INCISE IOBAN 66X45 STRL (DRAPES) ×1 IMPLANT
DRAPE SURG ORHT 6 SPLT 77X108 (DRAPES) ×2 IMPLANT
DRAPE U-SHAPE 47X51 STRL (DRAPES) ×1 IMPLANT
DRILL BIT LONG 2.7 (BIT) ×1 IMPLANT
DRSG AQUACEL AG ADV 3.5X14 (GAUZE/BANDAGES/DRESSINGS) IMPLANT
DURAPREP 26ML APPLICATOR (WOUND CARE) ×1 IMPLANT
ELECT REM PT RETURN 9FT ADLT (ELECTROSURGICAL) ×1 IMPLANT
ELECTRODE REM PT RTRN 9FT ADLT (ELECTROSURGICAL) ×1 IMPLANT
GAUZE PAD ABD 8X10 STRL (GAUZE/BANDAGES/DRESSINGS) ×1 IMPLANT
GLOVE BIO SURGEON STRL SZ7.5 (GLOVE) ×2 IMPLANT
GLOVE BIOGEL PI IND STRL 8 (GLOVE) ×2 IMPLANT
GOWN STRL REUS W/ TWL LRG LVL3 (GOWN DISPOSABLE) ×1 IMPLANT
GOWN STRL REUS W/ TWL XL LVL3 (GOWN DISPOSABLE) ×2 IMPLANT
KIT BASIN OR (CUSTOM PROCEDURE TRAY) ×1 IMPLANT
KIT TURNOVER KIT B (KITS) ×1 IMPLANT
MANIFOLD NEPTUNE II (INSTRUMENTS) ×1 IMPLANT
NS IRRIG 1000ML POUR BTL (IV SOLUTION) ×1 IMPLANT
PACK SHOULDER (CUSTOM PROCEDURE TRAY) ×1 IMPLANT
PAD ARMBOARD 7.5X6 YLW CONV (MISCELLANEOUS) ×2 IMPLANT
PROS LCP PLATE 12 163M (Plate) ×1 IMPLANT
PROSTHESIS LCP PLATE 12 163M (Plate) IMPLANT
SCREW CORTEX 2.7 26MM (Screw) IMPLANT
SCREW CORTEX 2.7X22MM (Screw) IMPLANT
SCREW LOCK CORT ST 3.5X22 (Screw) IMPLANT
SCREW LOCK CORT ST 3.5X24 (Screw) IMPLANT
SCREW LOCK CORT ST 3.5X28 (Screw) IMPLANT
SCREW LOCK CORT ST 3.5X30 (Screw) IMPLANT
SLING ULTRA III MED (ORTHOPEDIC SUPPLIES) IMPLANT
SUCTION TUBE FRAZIER 10FR DISP (SUCTIONS) ×1 IMPLANT
SUT MNCRL+ AB 3-0 CT1 36 (SUTURE) IMPLANT
SUT MON AB 2-0 CT1 36 (SUTURE) IMPLANT
SUT VIC AB 2-0 CT1 TAPERPNT 27 (SUTURE) ×1 IMPLANT
TOWEL GREEN STERILE (TOWEL DISPOSABLE) ×1 IMPLANT
TOWEL GREEN STERILE FF (TOWEL DISPOSABLE) ×1 IMPLANT
WATER STERILE IRR 1000ML POUR (IV SOLUTION) ×1 IMPLANT
YANKAUER SUCT BULB TIP NO VENT (SUCTIONS) ×1 IMPLANT

## 2023-12-26 NOTE — Op Note (Signed)
 12/26/2023  5:04 PM  PATIENT:  Melissa Bowman    PRE-OPERATIVE DIAGNOSIS:  Left humerus fracture  POST-OPERATIVE DIAGNOSIS:  Same  PROCEDURE:  OPEN REDUCTION INTERNAL FIXATION (ORIF) HUMERUS FRACTURE  SURGEON:  Selinda Belvie Gosling, MD  PHYSICIAN ASSISTANT: Dayle Moores, PA-C, present and scrubbed throughout the case, critical for completion in a timely fashion, and for retraction, instrumentation, and closure.  ANESTHESIA:   General with regional  PREOPERATIVE INDICATIONS:  Melissa Bowman is a  73 y.o. female with a diagnosis of Left humerus fracture who elected for surgical management.    The risks benefits and alternatives were discussed with the patient including but not limited to the risks of nonoperative treatment, versus surgical intervention including infection, bleeding, nerve injury, malunion, nonunion, the need for revision surgery, hardware prominence, hardware failure, the need for hardware removal, blood clots, cardiopulmonary complications, conversion to arthroplasty, morbidity, mortality, among others, and they were willing to proceed.  Predicted outcome is good, although there will be at least a six to nine month expected recovery.   OPERATIVE IMPLANTS: 12 hole Synthes 3.5 mm LCP plate with 7 cortical 3.5 mm screws and 3, 2.7 mm interfragmentary screws.  OPERATIVE FINDINGS: Displaced proximal humerus fracture.  UNIQUE ASPECTS OF THE CASE:  Long spiral with extension proximally into the biceps groove.  OPERATIVE PROCEDURE: The patient was brought to the operating room and placed in the supine position. General anesthesia was administered. IV antibiotics were given. She was placed in the supine position. All bony prominences were padded. The upper extremity was prepped and draped in usual sterile fashion. Extended anterolateral approach was performed.    I exposed the fracture site, and placed deep retractors.  We did note that the spiral did move up to the biceps  groove proximally adjacent to the pectoralis tendon.  Hematoma was removed.  We then performed a reduction maneuver and were able to get a repeat off of the spike at the midshaft.  3 clamps were placed.  We then placed 3 interfragmentary screws by technique with excellent compression.  These were 2.7 mm compression/cortical screws.  He  We then selected a plate.  We chose a 12 hole plate to span this long spiral piece.  We were able to secure this distally with 4 cortical screws, 3.5 mm and proximally with three 3.5 millimeter screws.     . Once complete fixation and reduction of been achieved, took final C-arm pictures, and irrigated the wounds copiously, and repaired the deltopectoral interval with Vicryl followed by Vicryl for the subcutaneous tissue with Monocryl and Steri-Strips for the skin.  As well as Aquacel dressing.  She was placed in a sling. She had a preoperative regional block as well. She tolerated the procedure well with no complications.   Disposition:  Melissa Bowman was placed in a sling.  Aquacel dressing applied.  She will be in the sling for comfort.  She can begin using arm immediately for activities of daily living.  No lifting over 2 pounds.  I will see her back in the office in 2 weeks with AP and lateral of the humerus.

## 2023-12-26 NOTE — Anesthesia Preprocedure Evaluation (Addendum)
 Anesthesia Evaluation  Patient identified by MRN, date of birth, ID band Patient awake    Reviewed: Allergy & Precautions, NPO status , Patient's Chart, lab work & pertinent test results  Airway Mallampati: II  TM Distance: >3 FB Neck ROM: Full    Dental  (+) Dental Advisory Given, Teeth Intact   Pulmonary asthma , sleep apnea    Pulmonary exam normal breath sounds clear to auscultation       Cardiovascular hypertension, Pt. on medications Normal cardiovascular exam+ Valvular Problems/Murmurs  Rhythm:Regular Rate:Normal  Echo 2022  1. Left ventricular ejection fraction, by estimation, is 65 to 70%. The left ventricle has normal function. The left ventricle has no regional wall motion abnormalities. Left ventricular diastolic parameters are indeterminate.   2. Right ventricular systolic function is normal. The right ventricular size is normal. There is normal pulmonary artery systolic pressure. The estimated right ventricular systolic pressure is 31.3 mmHg.   3. The mitral valve is grossly normal. Trivial mitral valve regurgitation.   4. The aortic valve is tricuspid. Aortic valve regurgitation is not visualized.   5. The inferior vena cava is normal in size with greater than 50% respiratory variability, suggesting right atrial pressure of 3 mmHg.   Comparison(s): Echocardiogram done 12/30/17 showed an EF of 65-70%.     Neuro/Psych  PSYCHIATRIC DISORDERS Anxiety Depression     Neuromuscular disease    GI/Hepatic Neg liver ROS, hiatal hernia,GERD  ,,  Endo/Other  diabetes    Renal/GU negative Renal ROS     Musculoskeletal  (+) Arthritis ,    Abdominal   Peds  Hematology negative hematology ROS (+)   Anesthesia Other Findings   Reproductive/Obstetrics                             Anesthesia Physical Anesthesia Plan  ASA: 3  Anesthesia Plan: General   Post-op Pain Management: Ofirmev  IV  (intra-op)* and Regional block*   Induction: Intravenous  PONV Risk Score and Plan: 3 and Ondansetron , Dexamethasone  and Treatment may vary due to age or medical condition  Airway Management Planned: Oral ETT  Additional Equipment:   Intra-op Plan:   Post-operative Plan: Extubation in OR  Informed Consent: I have reviewed the patients History and Physical, chart, labs and discussed the procedure including the risks, benefits and alternatives for the proposed anesthesia with the patient or authorized representative who has indicated his/her understanding and acceptance.     Dental advisory given  Plan Discussed with: CRNA  Anesthesia Plan Comments: (I entered the room to speak to the patient at 1451. During my interview Dr. Sharolyn entered, signed the consent  (signed as [phone]) and left (~1454). H/P entered at 1455, and then he went into the room to speak to the patient until ~1459.   Risks of anesthesia explained at length. This includes, but is not limited to, sore throat, damage to teeth, lips gums, tongue and vocal cords, nausea and vomiting, reactions to medications, stroke, heart attack, and death. All patient questions were answered and the patient wishes to proceed. Risks of peripheral nerve block explained at length. This includes, but is not limited to, bleeding, infection, reactions to the medications, seizures, damage to surrounding structures, damage to nerves, permanent weakness, numbness, tingling and pain. All patient questions were answered and patient wishes to proceed with nerve block. )       Anesthesia Quick Evaluation

## 2023-12-26 NOTE — Brief Op Note (Signed)
 12/26/2023  4:54 PM  PATIENT:  Melissa Bowman  73 y.o. female  PRE-OPERATIVE DIAGNOSIS:  Left humerus fracture  POST-OPERATIVE DIAGNOSIS:  Left humerus fracture  PROCEDURE:  Procedure(s) with comments: OPEN REDUCTION INTERNAL FIXATION (ORIF) HUMERUS FRACTURE (Left) - 90  SURGEON:  Surgeons and Role:    * Sharl Selinda Dover, MD - Primary  PHYSICIAN ASSISTANT: Dayle Moores, PA-C  ANESTHESIA:   regional and general  EBL:  200 cc  BLOOD ADMINISTERED:none  DRAINS: none   LOCAL MEDICATIONS USED:  NONE  SPECIMEN:  No Specimen  DISPOSITION OF SPECIMEN:  N/A  COUNTS:  YES  TOURNIQUET:  * No tourniquets in log *  DICTATION: .Note written in EPIC  PLAN OF CARE: Discharge to home after PACU  PATIENT DISPOSITION:  PACU - hemodynamically stable.   Delay start of Pharmacological VTE agent (>24hrs) due to surgical blood loss or risk of bleeding: not applicable

## 2023-12-26 NOTE — Anesthesia Procedure Notes (Signed)
 Procedure Name: Intubation Date/Time: 12/26/2023 3:25 PM  Performed by: Alen Motto D, CRNAPre-anesthesia Checklist: Patient identified, Emergency Drugs available, Suction available and Patient being monitored Patient Re-evaluated:Patient Re-evaluated prior to induction Oxygen Delivery Method: Circle System Utilized Preoxygenation: Pre-oxygenation with 100% oxygen Induction Type: IV induction Ventilation: Mask ventilation without difficulty Laryngoscope Size: Mac and 3 Grade View: Grade II Tube type: Oral Tube size: 7.0 mm Number of attempts: 1 Airway Equipment and Method: Stylet and Oral airway Placement Confirmation: ETT inserted through vocal cords under direct vision, positive ETCO2 and breath sounds checked- equal and bilateral Secured at: 20 cm Tube secured with: Tape Dental Injury: Teeth and Oropharynx as per pre-operative assessment

## 2023-12-26 NOTE — OR Nursing (Signed)
 Scanning out the 4x18 one of them will not scan out.  Scanned in fine.

## 2023-12-26 NOTE — Anesthesia Procedure Notes (Addendum)
 Anesthesia Regional Block: Interscalene brachial plexus block   Pre-Anesthetic Checklist: , timeout performed,  Correct Patient, Correct Site, Correct Laterality,  Correct Procedure, Correct Position, site marked,  Risks and benefits discussed,  Surgical consent,  Pre-op evaluation,  At surgeon's request and post-op pain management  Laterality: Upper and Left  Prep: chloraprep       Needles:  Injection technique: Single-shot  Needle Type: Stimulator Needle - 40     Needle Length: 4cm  Needle Gauge: 22     Additional Needles:   Procedures:,,,, ultrasound used (permanent image in chart),,    Narrative:  Start time: 12/26/2023 2:51 PM End time: 12/26/2023 3:09 PM Injection made incrementally with aspirations every 5 mL.  Performed by: Personally  Anesthesiologist: Darlyn Rush, MD  Additional Notes: BP cuff, SpO2 and EKG monitors applied. Sedation begun. Nerve location verified with ultrasound. Anesthetic injected incrementally, slowly, and after neg aspirations under direct u/s guidance. Good perineural spread. Tolerated well.

## 2023-12-26 NOTE — Discharge Instructions (Addendum)
 Orthopedic surgery discharge instructions:  -Maintain postoperative bandage until follow-up appointment.  This is waterproof, and you may begin showering on postoperative day #3.  Do not submerge underwater.  Maintain that bandage until your follow-up appointment in 2 weeks.  -No lifting over 2 pounds with operateive arm.  You may use the arm immediately for activities of daily living such as bathing, washing your face and brushing your teeth, eating, and getting dressed.  Otherwise maintain your sling when you are out of the house and sleeping.  -Apply ice liberally to the shoulder throughout the day.  For mild to moderate pain use Tylenol and Advil as needed around-the-clock.  For breakthrough pain use oxycodone as necessary.  -You will return to see Dr. Aundria Rud in the office in 2 weeks for routine postoperative check with x-rays.

## 2023-12-26 NOTE — H&P (Signed)
 ORTHOPAEDIC H&P  REQUESTING PHYSICIAN: Sharl Selinda Dover, MD  PCP:  [name], DO  Chief Complaint: Left humerus fracture  HPI: Melissa Bowman is a 73 y.o. female who complains of left arm pain following a fall about 5 days ago.  X-rays in the office showed a displaced midshaft humerus fracture.  Here today for open reduction and internal fixation.  No new complaints.  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 pylori infection 11/16/1993   Hiatal hernia    History of benign adrenal tumor 2007   [facility]   History of pneumonia 2007, 2008   Hyperlipidemia    Hypertension    IBS (irritable bowel syndrome)    Internal hemorrhoids    Kyphosis    Long term current use of antipsychotic medication 08/19/2022   Memory loss    Menopause    Metabolic syndrome    OSA on CPAP    Prolonged Q-T interval on ECG 08/19/2022   Type 2 diabetes mellitus (HCC)    Vitamin D  deficiency    Past Surgical History:  Procedure Laterality Date   abdominal wall lematoma of fabdd hys.     APPENDECTOMY     CARPAL TUNNEL RELEASE     CESAREAN SECTION     X2   COLONOSCOPY     INCISIONAL HERNIA REPAIR     right breast biopsy  09/1995   ROTATOR CUFF REPAIR  2002   right   ROTATOR CUFF REPAIR/ bone spur  08/30/2009   SHOULDER ARTHROSCOPY WITH ROTATOR CUFF REPAIR AND SUBACROMIAL DECOMPRESSION Right 01/19/2018   Procedure: REVISION RIGHT MINI OPEN ROTATOR CUFF REPAIR AND SUBACROMIAL DECOMPRESSION;  Surgeon: Duwayne Purchase, MD;  Location: [facility];  Service: Orthopedics;  Laterality: Right;  90 MINS   sleep apnea surgery  04/1995   torn cartiledge rt knee x2  05/13/2006   Dr. Rubie Smoc    TOTAL ABDOMINAL HYSTERECTOMY W/ BILATERAL  SALPINGOOPHORECTOMY  07/1993   fibroids    Social History   Socioeconomic History   Marital status: Married    Spouse name: [name]   Number of children: 2   Years of education: some college   Highest education level: Some college, no degree  Occupational History   Occupation: retired    Comment: daycare  Tobacco Use   Smoking status: Never   Smokeless tobacco: Never  Vaping Use   Vaping status: Never Used  Substance and Sexual Activity   Alcohol  use: No   Drug use: No   Sexual activity: Not on file  Other Topics Concern   Not on file  Social History Narrative   [name] is retired. She lives with her husband [name]. They have two grown daughters that live locally. [name] enjoys reading and puzzles.       Social Drivers of Corporate Investment Banker Strain: Low Risk  (04/25/2023)   Overall Financial Resource Strain (CARDIA)    Difficulty of Paying Living Expenses: Not hard at all  Food Insecurity: No Food Insecurity (04/25/2023)   Hunger Vital Sign    Worried About Running Out of Food in the Last Year: Never true    Ran Out of Food  in the Last Year: Never true  Transportation Needs: No Transportation Needs (04/25/2023)   PRAPARE - Administrator, Civil Service (Medical): No    Lack of Transportation (Non-Medical): No  Physical Activity: Insufficiently Active (04/25/2023)   Exercise Vital Sign    Days of Exercise per Week: 3 days    Minutes of Exercise per Session: 30 min  Stress: No Stress Concern Present (04/25/2023)   [employer] of Occupational Health - Occupational Stress Questionnaire    Feeling of Stress : Not at all  Social Connections: Unknown (04/25/2023)   Social Connection and Isolation Panel [NHANES]    Frequency of Communication with Friends and Family: Patient unable to answer    Frequency of Social Gatherings with Friends and Family: Patient unable to answer    Attends Religious Services: Never    Database Administrator or Organizations:  No    Attends Engineer, Structural: Never    Marital Status: Married   Family History  Problem Relation Age of Onset   Heart disease Mother    Parkinsonism Father    Emphysema Father    Hip fracture Father    Heart failure Sister    Ovarian cancer Sister    COPD Sister    Lymphoma Sister    COPD Sister    COPD Brother    Hypertension Brother    Heart disease Brother    COPD Brother    Colon cancer Neg Hx    Esophageal cancer Neg Hx    Pancreatic cancer Neg Hx    Stomach cancer Neg Hx    Breast cancer Neg Hx    Allergies  Allergen Reactions   Statins Other (See Comments)    myalgias   E-Mycin [Erythromycin] Other (See Comments)    Medication intolerance stomach cramps   Estrogens Other (See Comments)    Estrogen replacement Therapy intolerance mood changes   Sulfa Antibiotics Other (See Comments)    Causes severe stomach cramps    Vioxx [Rofecoxib] Other (See Comments)    Unknown reaction/interaction. Possible stomach cramps   Prior to Admission medications   Medication Sig Start Date End Date Taking? Authorizing Provider  Ascorbic Acid  (VITAMIN C ) 1000 MG tablet Take 1,000 mg by mouth in the morning.   Yes [provider]  CALCIUM  PO Take 1,200 mg by mouth in the morning.   Yes [provider]  Cholecalciferol  (VITAMIN D -3) 5000 UNITS TABS Take 5,000 Units by mouth in the morning.   Yes [provider]  desloratadine  (CLARINEX ) 5 MG tablet Take 5 mg by mouth in the morning. 10/14/23  Yes [provider]  famotidine  (PEPCID ) 20 MG tablet Take 1 tablet (20 mg total) by mouth at bedtime. 08/25/23  Yes Zehr, Jessica D, PA-C  fenofibrate  160 MG tablet TAKE 1 TABLET BY MOUTH EVERY DAY 11/07/23  Yes Jolinda Potter M, DO  fluticasone  (FLONASE ) 50 MCG/ACT nasal spray Use 2 sprays in each  nostril daily Patient taking differently: 1 spray daily as needed for allergies. 11/19/15  Yes Zollie Lowers, MD  ibuprofen (ADVIL) 200 MG tablet  Take 400 mg by mouth every 8 (eight) hours as needed (pain.).   Yes [provider]  irbesartan -hydrochlorothiazide  (AVALIDE) 150-12.5 MG tablet TAKE 1 TABLET BY MOUTH EVERY DAY 12/13/23  Yes Gottschalk, Ashly M, DO  MAGNESIUM CITRATE PO Take 1 tablet by mouth in the morning.   Yes [provider]  memantine  (NAMENDA ) 5 MG tablet Take 1 tablet (  5 mg total) by mouth 2 (two) times daily. 09/12/23  Yes Jolinda Potter M, DO  mirtazapine  (REMERON ) 45 MG tablet Take 1 tablet (45 mg total) by mouth at bedtime. 12/22/23  Yes Gottschalk, Potter M, DO  montelukast  (SINGULAIR ) 10 MG tablet Take 1 tablet (10 mg total) by mouth daily. Patient taking differently: Take 10 mg by mouth at bedtime. 09/12/23  Yes Gottschalk, Ashly M, DO  omeprazole  (PRILOSEC) 40 MG capsule Take 40 mg by mouth in the morning and at bedtime.   Yes [provider]  oxyCODONE -acetaminophen  (PERCOCET/ROXICET) 5-325 MG tablet Take 1 tablet by mouth every 6 (six) hours as needed (pain). 12/22/23  Yes [provider]  pantoprazole  (PROTONIX ) 40 MG tablet Take 1 tablet (40 mg total) by mouth 2 (two) times daily before a meal. 11/18/23  Yes Dettinger, Fonda LABOR, MD  sertraline  (ZOLOFT ) 50 MG tablet Take 1 tablet (50 mg total) by mouth daily. 05/12/23 02/06/24 Yes Barbra Jayson LABOR, MD  PROAIR  HFA 108 (90 BASE) MCG/ACT inhaler Use 2 puffs into the lungs  every 6 hours as needed for wheezing or shortness of  breath 11/19/15   Zollie Lowers, MD   No results found.  Positive ROS: All other systems have been reviewed and were otherwise negative with the exception of those mentioned in the HPI and as above.  Physical Exam: General: Alert, no acute distress Cardiovascular: No pedal edema Respiratory: No cyanosis, no use of accessory musculature GI: No organomegaly, abdomen is soft and non-tender Skin: No lesions in the area of chief complaint Neurologic: Sensation intact distally Psychiatric: Patient is competent for  consent with normal mood and affect Lymphatic: No axillary or cervical lymphadenopathy  MUSCULOSKELETAL: Left upper extremity is warm and well-perfused.  No open wounds or lesions.  Assessment: Left humerus closed midshaft fracture with displacement  Plan: Plan to proceed with open reduction internal fixation.  The risks, benefits, and alternatives were discussed with the patient. There are risks associated with the surgery including, but not limited to, problems with anesthesia (death), infection, differences in leg length/angulation/rotation, fracture of bones, loosening or failure of implants, malunion, nonunion, hematoma (blood accumulation) which may require surgical drainage, blood clots, pulmonary embolism, nerve injury (foot drop), and blood vessel injury. The patient understands these risks and elects to proceed.   Discharge home today postop.    Selinda Belvie Gosling, MD Cell [phone]    12/26/2023 2:54 PM

## 2023-12-26 NOTE — Transfer of Care (Signed)
 Immediate Anesthesia Transfer of Care Note  Patient: Melissa Bowman  Procedure(s) Performed: OPEN REDUCTION INTERNAL FIXATION (ORIF) HUMERUS FRACTURE (Left)  Patient Location: PACU  Anesthesia Type:General  Level of Consciousness: awake and alert   Airway & Oxygen Therapy: Patient Spontanous Breathing and Patient connected to nasal cannula oxygen  Post-op Assessment: Report given to RN and Post -op Vital signs reviewed and stable  Post vital signs: Reviewed and stable  Last Vitals:  Vitals Value Taken Time  BP 129/56 12/26/23 1720  Temp    Pulse 81 12/26/23 1725  Resp 15 12/26/23 1725  SpO2 100 % 12/26/23 1725  Vitals shown include unfiled device data.  Last Pain:  Vitals:   12/26/23 1510  TempSrc:   PainSc: 0-No pain      Patients Stated Pain Goal: 0 (12/26/23 1425)  Complications: No notable events documented.

## 2023-12-27 ENCOUNTER — Encounter (HOSPITAL_COMMUNITY): Payer: Self-pay | Admitting: Orthopedic Surgery

## 2023-12-27 ENCOUNTER — Other Ambulatory Visit: Payer: Self-pay

## 2023-12-27 NOTE — Addendum Note (Signed)
 Addendum  created 12/27/23 1401 by Lewie Loron, MD   Clinical Note Signed, Intraprocedure Blocks edited, SmartForm saved

## 2023-12-27 NOTE — Addendum Note (Signed)
 Addendum  created 12/27/23 0907 by Lewie Loron, MD   Clinical Note Signed

## 2023-12-27 NOTE — Anesthesia Postprocedure Evaluation (Signed)
 Anesthesia Post Note  Patient: Melissa Bowman  Procedure(s) Performed: OPEN REDUCTION INTERNAL FIXATION (ORIF) HUMERUS FRACTURE (Left)     Patient location during evaluation: PACU Anesthesia Type: General Level of consciousness: sedated and patient cooperative Pain management: pain level controlled Vital Signs Assessment: post-procedure vital signs reviewed and stable Respiratory status: spontaneous breathing Cardiovascular status: stable Anesthetic complications: no   No notable events documented.  Last Vitals:  Vitals:   12/26/23 1730 12/26/23 1745  BP: 116/72 107/65  Pulse: 83 81  Resp: 19 (!) 23  Temp:  36.8 C  SpO2: 97% 98%    Last Pain:  Vitals:   12/26/23 1720  TempSrc:   PainSc: 0-No pain                 Norleen Pope

## 2024-01-09 DIAGNOSIS — Z4789 Encounter for other orthopedic aftercare: Secondary | ICD-10-CM | POA: Insufficient documentation

## 2024-01-10 ENCOUNTER — Emergency Department (HOSPITAL_COMMUNITY): Payer: Medicare HMO

## 2024-01-10 ENCOUNTER — Ambulatory Visit
Admission: EM | Admit: 2024-01-10 | Discharge: 2024-01-10 | Disposition: A | Discharge status: Home / Self Care | Payer: Medicare HMO | Attending: Internal Medicine | Admitting: Internal Medicine

## 2024-01-10 ENCOUNTER — Other Ambulatory Visit: Payer: Self-pay

## 2024-01-10 ENCOUNTER — Encounter (HOSPITAL_COMMUNITY): Payer: Self-pay

## 2024-01-10 ENCOUNTER — Encounter: Payer: Self-pay | Admitting: Emergency Medicine

## 2024-01-10 ENCOUNTER — Observation Stay (HOSPITAL_COMMUNITY)
Admission: EM | Admit: 2024-01-10 | Discharge: 2024-01-16 | Disposition: A | Discharge status: Skilled Nursing Facility | Payer: Medicare HMO | Attending: Internal Medicine | Admitting: Internal Medicine

## 2024-01-10 DIAGNOSIS — R001 Bradycardia, unspecified: Secondary | ICD-10-CM | POA: Diagnosis not present

## 2024-01-10 DIAGNOSIS — R4781 Slurred speech: Secondary | ICD-10-CM | POA: Diagnosis not present

## 2024-01-10 DIAGNOSIS — R9082 White matter disease, unspecified: Secondary | ICD-10-CM | POA: Diagnosis not present

## 2024-01-10 DIAGNOSIS — D649 Anemia, unspecified: Secondary | ICD-10-CM | POA: Insufficient documentation

## 2024-01-10 DIAGNOSIS — I672 Cerebral atherosclerosis: Secondary | ICD-10-CM | POA: Diagnosis not present

## 2024-01-10 DIAGNOSIS — R4182 Altered mental status, unspecified: Secondary | ICD-10-CM | POA: Diagnosis not present

## 2024-01-10 DIAGNOSIS — R2689 Other abnormalities of gait and mobility: Secondary | ICD-10-CM | POA: Insufficient documentation

## 2024-01-10 DIAGNOSIS — R9431 Abnormal electrocardiogram [ECG] [EKG]: Secondary | ICD-10-CM | POA: Diagnosis not present

## 2024-01-10 DIAGNOSIS — K219 Gastro-esophageal reflux disease without esophagitis: Secondary | ICD-10-CM | POA: Insufficient documentation

## 2024-01-10 DIAGNOSIS — R471 Dysarthria and anarthria: Secondary | ICD-10-CM | POA: Diagnosis not present

## 2024-01-10 DIAGNOSIS — F0390 Unspecified dementia without behavioral disturbance: Secondary | ICD-10-CM | POA: Insufficient documentation

## 2024-01-10 DIAGNOSIS — R54 Age-related physical debility: Secondary | ICD-10-CM | POA: Diagnosis not present

## 2024-01-10 DIAGNOSIS — R410 Disorientation, unspecified: Secondary | ICD-10-CM | POA: Diagnosis not present

## 2024-01-10 DIAGNOSIS — E876 Hypokalemia: Secondary | ICD-10-CM | POA: Insufficient documentation

## 2024-01-10 DIAGNOSIS — G9341 Metabolic encephalopathy: Secondary | ICD-10-CM | POA: Diagnosis not present

## 2024-01-10 DIAGNOSIS — Z4789 Encounter for other orthopedic aftercare: Secondary | ICD-10-CM | POA: Diagnosis not present

## 2024-01-10 DIAGNOSIS — Z79899 Other long term (current) drug therapy: Secondary | ICD-10-CM | POA: Diagnosis not present

## 2024-01-10 DIAGNOSIS — E119 Type 2 diabetes mellitus without complications: Secondary | ICD-10-CM | POA: Insufficient documentation

## 2024-01-10 DIAGNOSIS — I10 Essential (primary) hypertension: Secondary | ICD-10-CM | POA: Diagnosis not present

## 2024-01-10 DIAGNOSIS — M79604 Pain in right leg: Secondary | ICD-10-CM | POA: Diagnosis present

## 2024-01-10 DIAGNOSIS — Z743 Need for continuous supervision: Secondary | ICD-10-CM | POA: Diagnosis not present

## 2024-01-10 DIAGNOSIS — E161 Other hypoglycemia: Secondary | ICD-10-CM | POA: Diagnosis not present

## 2024-01-10 DIAGNOSIS — I6381 Other cerebral infarction due to occlusion or stenosis of small artery: Secondary | ICD-10-CM | POA: Diagnosis not present

## 2024-01-10 LAB — DIFFERENTIAL
Abs Immature Granulocytes: 0.03 10*3/uL (ref 0.00–0.07)
Basophils Absolute: 0.1 10*3/uL (ref 0.0–0.1)
Basophils Relative: 1 %
Eosinophils Absolute: 0.2 10*3/uL (ref 0.0–0.5)
Eosinophils Relative: 4 %
Immature Granulocytes: 1 %
Lymphocytes Relative: 44 %
Lymphs Abs: 2.7 10*3/uL (ref 0.7–4.0)
Monocytes Absolute: 0.4 10*3/uL (ref 0.1–1.0)
Monocytes Relative: 7 %
Neutro Abs: 2.6 10*3/uL (ref 1.7–7.7)
Neutrophils Relative %: 43 %

## 2024-01-10 LAB — CBC
HCT: 30.5 % — ABNORMAL LOW (ref 36.0–46.0)
HCT: 27.6 % — ABNORMAL LOW (ref 36.0–46.0)
Hemoglobin: 10.2 g/dL — ABNORMAL LOW (ref 12.0–15.0)
Hemoglobin: 9.3 g/dL — ABNORMAL LOW (ref 12.0–15.0)
MCH: 29.8 pg (ref 26.0–34.0)
MCH: 29.9 pg (ref 26.0–34.0)
MCHC: 33.4 g/dL (ref 30.0–36.0)
MCHC: 33.7 g/dL (ref 30.0–36.0)
MCV: 89.2 fL (ref 80.0–100.0)
MCV: 88.7 fL (ref 80.0–100.0)
Platelets: 437 10*3/uL — ABNORMAL HIGH (ref 150–400)
Platelets: 394 10*3/uL (ref 150–400)
RBC: 3.42 MIL/uL — ABNORMAL LOW (ref 3.87–5.11)
RBC: 3.11 MIL/uL — ABNORMAL LOW (ref 3.87–5.11)
RDW: 14.4 % (ref 11.5–15.5)
RDW: 14.2 % (ref 11.5–15.5)
WBC: 6 10*3/uL (ref 4.0–10.5)
WBC: 5.7 10*3/uL (ref 4.0–10.5)
nRBC: 0 % (ref 0.0–0.2)
nRBC: 0 % (ref 0.0–0.2)

## 2024-01-10 LAB — RAPID URINE DRUG SCREEN, HOSP PERFORMED
Amphetamines: NOT DETECTED
Barbiturates: NOT DETECTED
Benzodiazepines: NOT DETECTED
Cocaine: NOT DETECTED
Opiates: NOT DETECTED
Tetrahydrocannabinol: NOT DETECTED

## 2024-01-10 LAB — PROTIME-INR
INR: 1 (ref 0.8–1.2)
Prothrombin Time: 13.4 s (ref 11.4–15.2)

## 2024-01-10 LAB — I-STAT CHEM 8, ED
BUN: 15 mg/dL (ref 8–23)
Calcium, Ion: 1.14 mmol/L — ABNORMAL LOW (ref 1.15–1.40)
Chloride: 106 mmol/L (ref 98–111)
Creatinine, Ser: 1 mg/dL (ref 0.44–1.00)
Glucose, Bld: 94 mg/dL (ref 70–99)
HCT: 30 % — ABNORMAL LOW (ref 36.0–46.0)
Hemoglobin: 10.2 g/dL — ABNORMAL LOW (ref 12.0–15.0)
Potassium: 3.3 mmol/L — ABNORMAL LOW (ref 3.5–5.1)
Sodium: 141 mmol/L (ref 135–145)
TCO2: 24 mmol/L (ref 22–32)

## 2024-01-10 LAB — ETHANOL: Alcohol, Ethyl (B): <10 mg/dL (ref <10)

## 2024-01-10 LAB — I-STAT CG4 LACTIC ACID, ED: Lactic Acid, Venous: 1.9 mmol/L (ref 0.5–1.9)

## 2024-01-10 LAB — APTT: aPTT: 26 s (ref 24–36)

## 2024-01-10 LAB — URINALYSIS, ROUTINE W REFLEX MICROSCOPIC
Bilirubin Urine: NEGATIVE
Glucose, UA: NEGATIVE mg/dL
Hgb urine dipstick: NEGATIVE
Ketones, ur: NEGATIVE mg/dL
Leukocytes,Ua: NEGATIVE
Nitrite: NEGATIVE
Protein, ur: NEGATIVE mg/dL
Specific Gravity, Urine: 1.008 (ref 1.005–1.030)
pH: 8 (ref 5.0–8.0)

## 2024-01-10 LAB — CREATININE, SERUM
Creatinine, Ser: 0.98 mg/dL (ref 0.44–1.00)
GFR, Estimated: >60 mL/min (ref >60)

## 2024-01-10 LAB — POCT FASTING CBG KUC MANUAL ENTRY: POCT Glucose (KUC): 69 mg/dL — AB (ref 70–99)

## 2024-01-10 LAB — COMPREHENSIVE METABOLIC PANEL
ALT: 17 U/L (ref 0–44)
AST: 26 U/L (ref 15–41)
Albumin: 3.7 g/dL (ref 3.5–5.0)
Alkaline Phosphatase: 87 U/L (ref 38–126)
Anion gap: 13 (ref 5–15)
BUN: 15 mg/dL (ref 8–23)
CO2: 22 mmol/L (ref 22–32)
Calcium: 9.9 mg/dL (ref 8.9–10.3)
Chloride: 105 mmol/L (ref 98–111)
Creatinine, Ser: 0.97 mg/dL (ref 0.44–1.00)
GFR, Estimated: >60 mL/min (ref >60)
Glucose, Bld: 100 mg/dL — ABNORMAL HIGH (ref 70–99)
Potassium: 3.3 mmol/L — ABNORMAL LOW (ref 3.5–5.1)
Sodium: 140 mmol/L (ref 135–145)
Total Bilirubin: 0.6 mg/dL (ref 0.0–1.2)
Total Protein: 6.4 g/dL — ABNORMAL LOW (ref 6.5–8.1)

## 2024-01-10 LAB — CBG MONITORING, ED: Glucose-Capillary: 108 mg/dL — ABNORMAL HIGH (ref 70–99)

## 2024-01-10 MED ORDER — MELATONIN 5 MG PO TABS: 5.0000 mg | ORAL_TABLET | Freq: Every evening | ORAL | Status: DC | PRN

## 2024-01-10 MED ORDER — ENOXAPARIN SODIUM 40 MG/0.4ML IJ SOSY
40.0000 mg | PREFILLED_SYRINGE | INTRAMUSCULAR | Status: DC
  Administered 2024-01-10 – 2024-01-15 (×6): 40 mg via SUBCUTANEOUS
  Filled 2024-01-10 (×6): qty 0.4

## 2024-01-10 MED ORDER — ACETAMINOPHEN 325 MG PO TABS: 650.0000 mg | ORAL_TABLET | Freq: Four times a day (QID) | ORAL | Status: DC | PRN

## 2024-01-10 MED ORDER — SODIUM CHLORIDE 0.9% FLUSH
3.0000 mL | Freq: Once | INTRAVENOUS | Status: AC
  Administered 2024-01-10: 3 mL via INTRAVENOUS

## 2024-01-10 MED ORDER — POLYETHYLENE GLYCOL 3350 17 G PO PACK
17.0000 g | PACK | Freq: Every day | ORAL | Status: DC | PRN
  Administered 2024-01-13 – 2024-01-15 (×2): 17 g via ORAL
  Filled 2024-01-10 (×2): qty 1

## 2024-01-10 MED ORDER — PROCHLORPERAZINE EDISYLATE 10 MG/2ML IJ SOLN: 5.0000 mg | Freq: Four times a day (QID) | INTRAMUSCULAR | Status: DC | PRN

## 2024-01-10 MED ORDER — POTASSIUM CHLORIDE CRYS ER 20 MEQ PO TBCR
40.0000 meq | EXTENDED_RELEASE_TABLET | Freq: Once | ORAL | Status: AC
  Administered 2024-01-10: 40 meq via ORAL
  Filled 2024-01-10: qty 2

## 2024-01-10 NOTE — ED Triage Notes (Signed)
PT BIB [facility] from urgent care around 1600 after PT was referred from there for new onset AMS and aphasia. PT's husband states LKW 0000 today, s/s onset started 0200, and worsened at 0530. PT has Hx of humerus fracture 2 weeks ago with a fall. PT has been taking new Rx of oxycodone that stopped past 48 hours. PT Aox3, not oriented to time. Husband stat3es aox4 baseline.

## 2024-01-10 NOTE — ED Notes (Signed)
Pt returned from MRI

## 2024-01-10 NOTE — ED Triage Notes (Signed)
Pt presents after change in mental status and possible loc. Husband states she had surgery about two weeks ago on shoulder due to fall. Husbands states she has been off today and was "dosing" in car on way here"

## 2024-01-10 NOTE — Consult Note (Signed)
NEUROLOGY CONSULT NOTE   Date of service: January 10, 2024 Patient Name: Melissa Bowman MRN:  [id number] DOB:  1951-04-25 Chief Complaint: confusion, mild dysarthria Requesting Provider: Coral Spikes, DO  History of Present Illness  Melissa Bowman is a 73 y.o. female with hx of hyperlipidemia, hypertension, and severe hearing impairment who was brought in by EMS from urgent care.  Last known well was midnight last night after which has been reported she became confused and had some slurring of her speech.  This slurred speech began at 2 AM and worsened to approximately 5:30 AM.  On my examination she was extremely hard of hearing and was not oriented to month.  She had severe pain in her left arm and her right leg due to recent falls (left humeral fracture status postsurgical repair 3 weeks ago) however she did not have clear weakness in these extremities.  She had very minimal dysarthria and mild word finding difficulty.  NIH stroke scale was 7.  CT head showed no acute process.  TNK was not administered due to presentation outside the window.  Exam is not consistent with LVO therefore CTA was not performed as part of stroke code.  LKW: midnight last night Modified rankin score: 2-Slight disability-UNABLE to perform all activities but does not need assistance IV Thrombolysis: no outside of window   NIHSS components Score: Comment  1a Level of Conscious 0[x]  1[]  2[]  3[]      1b LOC Questions 0[]  1[x]  2[]       1c LOC Commands 0[x]  1[]  2[]       2 Best Gaze 0[x]  1[]  2[]       3 Visual 0[x]  1[]  2[]  3[]      4 Facial Palsy 0[x]  1[]  2[]  3[]      5a Motor Arm - left 0[]  1[]  2[x]  3[]  4[]  UN[]    5b Motor Arm - Right 0[x]  1[]  2[]  3[]  4[]  UN[]    6a Motor Leg - Left 0[x]  1[]  2[]  3[]  4[]  UN[]    6b Motor Leg - Right 0[]  1[]  2[x]  3[]  4[]  UN[]    7 Limb Ataxia 0[x]  1[]  2[]  3[]  UN[]     8 Sensory 0[x]  1[]  2[]  UN[]      9 Best Language 0[]  1[x]  2[]  3[]      10 Dysarthria 0[]  1[x]  2[]  UN[]      11 Extinct.  and Inattention 0[x]  1[]  2[]       TOTAL:  7      ROS   UTA 2/2 severe hearing impairment  Past History   Past Medical History:  Diagnosis Date   Allergic rhinitis    Allergy to ertapenem    Anxiety 08/27/2011   Anxiety and depression    Asthmatic bronchitis    Bilateral carpal tunnel syndrome    Condyloma acuminata    Diverticulosis    Eczema    GERD (gastroesophageal reflux disease)    H. pylori infection 11/16/1993   Hiatal hernia    History of benign adrenal tumor 2007   [facility]   History of pneumonia 2007, 2008   Hyperlipidemia    Hypertension    IBS (irritable bowel syndrome)    Internal hemorrhoids    Kyphosis    Long term current use of antipsychotic medication 08/19/2022   Memory loss    Menopause    Metabolic syndrome    OSA on CPAP    Prolonged Q-T interval on ECG 08/19/2022   Type 2 diabetes mellitus (HCC)    Vitamin D deficiency     Past  Surgical History:  Procedure Laterality Date   abdominal wall lematoma of fabdd hys.     APPENDECTOMY     CARPAL TUNNEL RELEASE     CESAREAN SECTION     X2   COLONOSCOPY     INCISIONAL HERNIA REPAIR     ORIF HUMERUS FRACTURE Left 12/26/2023   Procedure: OPEN REDUCTION INTERNAL FIXATION (ORIF) HUMERUS FRACTURE;  Surgeon: Yolonda Kida, MD;  Location: [facility] OR;  Service: Orthopedics;  Laterality: Left;  90   right breast biopsy  09/1995   ROTATOR CUFF REPAIR  2002   right   ROTATOR CUFF REPAIR/ bone spur  08/30/2009   SHOULDER ARTHROSCOPY WITH ROTATOR CUFF REPAIR AND SUBACROMIAL DECOMPRESSION Right 01/19/2018   Procedure: REVISION RIGHT MINI OPEN ROTATOR CUFF REPAIR AND SUBACROMIAL DECOMPRESSION;  Surgeon: Jene Every, MD;  Location: [facility];  Service: Orthopedics;  Laterality: Right;  90 MINS   sleep apnea surgery  04/1995   torn cartiledge rt knee x2  05/13/2006   Dr. Sherlean Foot Smoc    TOTAL ABDOMINAL HYSTERECTOMY W/ BILATERAL SALPINGOOPHORECTOMY  07/1993   fibroids     Family History: Family History   Problem Relation Age of Onset   Heart disease Mother    Parkinsonism Father    Emphysema Father    Hip fracture Father    Heart failure Sister    Ovarian cancer Sister    COPD Sister    Lymphoma Sister    COPD Sister    COPD Brother    Hypertension Brother    Heart disease Brother    COPD Brother    Colon cancer Neg Hx    Esophageal cancer Neg Hx    Pancreatic cancer Neg Hx    Stomach cancer Neg Hx    Breast cancer Neg Hx     Social History  reports that she has never smoked. She has never used smokeless tobacco. She reports that she does not drink alcohol and does not use drugs.  Allergies  Allergen Reactions   Statins Other (See Comments)    myalgias   E-Mycin [Erythromycin] Other (See Comments)    Medication intolerance stomach cramps   Estrogens Other (See Comments)    Estrogen replacement Therapy intolerance mood changes   Sulfa Antibiotics Other (See Comments)    Causes severe stomach cramps    Vioxx [Rofecoxib] Other (See Comments)    Unknown reaction/interaction. Possible stomach cramps    Medications  No current facility-administered medications for this encounter.  Current Outpatient Medications:    Ascorbic Acid (VITAMIN C) 1000 MG tablet, Take 1,000 mg by mouth in the morning., Disp: , Rfl:    CALCIUM PO, Take 1,200 mg by mouth in the morning., Disp: , Rfl:    Cholecalciferol (VITAMIN D-3) 5000 UNITS TABS, Take 5,000 Units by mouth in the morning., Disp: , Rfl:    desloratadine (CLARINEX) 5 MG tablet, Take 5 mg by mouth in the morning., Disp: , Rfl:    famotidine (PEPCID) 20 MG tablet, Take 1 tablet (20 mg total) by mouth at bedtime., Disp: 90 tablet, Rfl: 1   fenofibrate 160 MG tablet, TAKE 1 TABLET BY MOUTH EVERY DAY, Disp: 90 tablet, Rfl: 1   fluticasone (FLONASE) 50 MCG/ACT nasal spray, Use 2 sprays in each  nostril daily (Patient taking differently: 1 spray daily as needed for allergies.), Disp: 48 g, Rfl: 2   ibuprofen (ADVIL) 200 MG tablet, Take  400 mg by mouth every 8 (eight) hours as needed (pain.)., Disp: ,  Rfl:    irbesartan-hydrochlorothiazide (AVALIDE) 150-12.5 MG tablet, TAKE 1 TABLET BY MOUTH EVERY DAY, Disp: 100 tablet, Rfl: 0   MAGNESIUM CITRATE PO, Take 1 tablet by mouth in the morning., Disp: , Rfl:    memantine (NAMENDA) 5 MG tablet, Take 1 tablet (5 mg total) by mouth 2 (two) times daily., Disp: 200 tablet, Rfl: 3   mirtazapine (REMERON) 45 MG tablet, Take 1 tablet (45 mg total) by mouth at bedtime., Disp: 90 tablet, Rfl: 0   montelukast (SINGULAIR) 10 MG tablet, Take 1 tablet (10 mg total) by mouth daily. (Patient taking differently: Take 10 mg by mouth at bedtime.), Disp: 100 tablet, Rfl: 3   omeprazole (PRILOSEC) 40 MG capsule, Take 40 mg by mouth in the morning and at bedtime., Disp: , Rfl:    ondansetron (ZOFRAN-ODT) 4 MG disintegrating tablet, Take 1 tablet (4 mg total) by mouth every 8 (eight) hours as needed for nausea or vomiting., Disp: 20 tablet, Rfl: 0   oxyCODONE-acetaminophen (PERCOCET/ROXICET) 5-325 MG tablet, Take 1 tablet by mouth every 6 (six) hours as needed for moderate pain (pain score 4-6) (pain)., Disp: 30 tablet, Rfl: 0   pantoprazole (PROTONIX) 40 MG tablet, Take 1 tablet (40 mg total) by mouth 2 (two) times daily before a meal., Disp: 60 tablet, Rfl: 1   PROAIR HFA 108 (90 BASE) MCG/ACT inhaler, Use 2 puffs into the lungs  every 6 hours as needed for wheezing or shortness of  breath, Disp: 25.5 g, Rfl: 2   sertraline (ZOLOFT) 50 MG tablet, Take 1 tablet (50 mg total) by mouth daily., Disp: 90 tablet, Rfl: 2  Vitals   Vitals:   25-Jan-2024 1600 2024/01/25 1706 2024/01/25 1722  BP:  (!) 158/65   Pulse:  (!) 57   Temp:  (!) 97.5 F (36.4 C)   TempSrc:  Oral   SpO2:  100%   Weight: 59.6 kg  60 kg  Height:   5' (1.524 m)    Body mass index is 25.83 kg/m.  Physical Exam   Gen: patient lying in bed, NAD unless moving LUE or RLE in which she reports pain CV: extremities appear well-perfused Resp:  normal WOB   Neurologic Examination   MS: alert, oriented to age but not month, follows commands Speech: minimal dysarthria, mild aphasia CN: PERRL, VFF, EOMI, sensation intact, face symmetric, hearing intact to voice Motor: no drift in RUE or LLE, drift to bed in LUE and RLE both pain-limited Sensory: SILT Reflexes: 1+ symm with toes down bilat Coordination: FNF intact on R Gait: deferred  Labs/Imaging/Neurodiagnostic studies   CBC:  Recent Labs  Lab 01-25-24 1738 01-25-2024 1741  WBC 6.0  --   NEUTROABS 2.6  --   HGB 10.2* 10.2*  HCT 30.5* 30.0*  MCV 89.2  --   PLT 437*  --    Basic Metabolic Panel:  Lab Results  Component Value Date   NA 141 Jan 25, 2024   K 3.3 (L) 01/25/2024   CO2 22 01-25-24   GLUCOSE 94 2024-01-25   BUN 15 2024/01/25   CREATININE 1.00 01-25-24   CALCIUM 9.9 2024-01-25   GFRNONAA >60 01/25/24   GFRAA 88 01/21/2021   Lipid Panel:  Lab Results  Component Value Date   LDLCALC 143 (H) 09/12/2023   HgbA1c:  Lab Results  Component Value Date   HGBA1C 5.0 09/12/2023   Urine Drug Screen: No results found for: "LABOPIA", "COCAINSCRNUR", "LABBENZ", "AMPHETMU", "THCU", "LABBARB"  Alcohol Level     Component  Value Date/Time   ETH <10 01/10/2024 1738   INR  Lab Results  Component Value Date   INR 1.0 01/10/2024   APTT  Lab Results  Component Value Date   APTT 26 01/10/2024   AED levels: No results found for: "PHENYTOIN", "ZONISAMIDE", "LAMOTRIGINE", "LEVETIRACETA"  CT Head without contrast(Personally reviewed): No acute process  ASSESSMENT   Melissa Bowman is a 73 y.o. female with hx of hyperlipidemia, hypertension, and severe hearing impairment who was brought in by EMS from urgent care.  Last known well was midnight last night after which has been reported she became confused and had some slurring of her speech.  This slurred speech began at 2 AM and worsened to approximately 5:30 AM.  On my examination she was extremely hard of  hearing and was not oriented to month.  She had severe pain in her left arm and her right leg due to recent falls (left humeral fracture status postsurgical repair 3 weeks ago) however she did not have clear weakness in these extremities.  She had very minimal dysarthria and mild word finding difficulty.  NIH stroke scale was 7.  CT head showed no acute process.  TNK was not administered due to presentation outside the window.  Exam is not consistent with LVO therefore CTA was not performed as part of stroke code.  Symptoms are concerning for small acute ischemic stroke versus altered mental status and dysarthria in the setting of systemic etiologies such as infection or metabolic encephalopathy.  Her only focality on my examination is secondary to pain.  RECOMMENDATIONS   - Infectious/metabolic workup per ED - MRI brain wo contrast, proceed with stroke workup if e/o acute ischemia. If negative given nonfocal exam no indication for TIA workup ______________________________________________________________________    Signed, Jefferson Fuel, MD [facility]

## 2024-01-10 NOTE — ED Provider Notes (Signed)
EMERGENCY DEPARTMENT AT [facility] Provider Note   CSN: [id number] Arrival date & time: 01/10/24  1646  An emergency department physician performed an initial assessment on this suspected stroke patient at 1647.  History  Chief Complaint  Patient presents with   Altered Mental Status   Weakness    Melissa Bowman is a 73 y.o. female.  73 year old presenting emergency department as a code stroke for reported slurred speech and dizziness.  Was sent by urgent care.  Last known normal around midnight last night, woke up at 2 AM with reported confusion per husband report.  Seemingly forgetful over the course today.  No unilateral weakness, no numbness no tingling no change in sensation.  Pain to the left upper extremity from prior fall/repair.  Has been taking Percocets for the past month.   Altered Mental Status Associated symptoms: weakness   Weakness      Home Medications Prior to Admission medications   Medication Sig Start Date End Date Taking? Authorizing Provider  Ascorbic Acid (VITAMIN C) 1000 MG tablet Take 1,000 mg by mouth in the morning.    [provider]  CALCIUM PO Take 1,200 mg by mouth in the morning.    [provider]  Cholecalciferol (VITAMIN D-3) 5000 UNITS TABS Take 5,000 Units by mouth in the morning.    [provider]  desloratadine (CLARINEX) 5 MG tablet Take 5 mg by mouth in the morning. 10/14/23   [provider]  famotidine (PEPCID) 20 MG tablet Take 1 tablet (20 mg total) by mouth at bedtime. 08/25/23   Zehr, Shanda Bumps D, PA-C  fenofibrate 160 MG tablet TAKE 1 TABLET BY MOUTH EVERY DAY 11/07/23   Delynn Flavin M, DO  fluticasone (FLONASE) 50 MCG/ACT nasal spray Use 2 sprays in each  nostril daily Patient taking differently: 1 spray daily as needed for allergies. 11/19/15   Mechele Claude, MD  HYDROcodone-acetaminophen (NORCO/VICODIN) 5-325 MG tablet 1 tablet every 8 (eight) hours as needed for  severe pain (pain score 7-10). 12/21/23   [provider]  ibuprofen (ADVIL) 200 MG tablet Take 400 mg by mouth every 8 (eight) hours as needed (pain.).    [provider]  irbesartan-hydrochlorothiazide (AVALIDE) 150-12.5 MG tablet TAKE 1 TABLET BY MOUTH EVERY DAY 12/13/23   Delynn Flavin M, DO  MAGNESIUM CITRATE PO Take 1 tablet by mouth in the morning.    [provider]  memantine (NAMENDA) 5 MG tablet Take 1 tablet (5 mg total) by mouth 2 (two) times daily. 09/12/23   [name], DO  mirtazapine (REMERON) 45 MG tablet Take 1 tablet (45 mg total) by mouth at bedtime. 12/22/23   [name], DO  montelukast (SINGULAIR) 10 MG tablet Take 1 tablet (10 mg total) by mouth daily. Patient taking differently: Take 10 mg by mouth at bedtime. 09/12/23   [name], DO  omeprazole (PRILOSEC) 40 MG capsule Take 40 mg by mouth in the morning and at bedtime.    [provider]  ondansetron (ZOFRAN-ODT) 4 MG disintegrating tablet Take 1 tablet (4 mg total) by mouth every 8 (eight) hours as needed for nausea or vomiting. 12/26/23   Yolonda Kida, MD  oxyCODONE-acetaminophen (PERCOCET/ROXICET) 5-325 MG tablet Take 1 tablet by mouth every 6 (six) hours as needed for moderate pain (pain score 4-6) (pain). 12/26/23   Yolonda Kida, MD  pantoprazole (PROTONIX) 40 MG tablet Take 1 tablet (40 mg total) by mouth 2 (two)  times daily before a meal. 11/18/23   Dettinger, Elige Radon, MD  PROAIR HFA 108 (90 BASE) MCG/ACT inhaler Use 2 puffs into the lungs  every 6 hours as needed for wheezing or shortness of  breath Patient taking differently: Inhale 2 puffs into the lungs every 6 (six) hours as needed for wheezing or shortness of breath. 11/19/15   Mechele Claude, MD  sertraline (ZOLOFT) 50 MG tablet Take 1 tablet (50 mg total) by mouth daily. 05/12/23 02/06/24  Elsie Lincoln, MD      Allergies    Statins, E-mycin [erythromycin], Estrogens, Sulfa  antibiotics, and Vioxx [rofecoxib]    Review of Systems   Review of Systems  Neurological:  Positive for weakness.    Physical Exam Updated Vital Signs BP (!) 145/73   Pulse (!) 51   Temp 99.5 F (37.5 C) (Oral)   Resp (!) 22   Ht 5' (1.524 m)   Wt 60 kg   SpO2 100%   BMI 25.83 kg/m  Physical Exam Vitals and nursing note reviewed.  Constitutional:      General: She is not in acute distress.    Appearance: She is not toxic-appearing.  HENT:     Head: Normocephalic and atraumatic.     Nose: Nose normal.  Eyes:     Conjunctiva/sclera: Conjunctivae normal.  Cardiovascular:     Rate and Rhythm: Normal rate and regular rhythm.  Pulmonary:     Effort: Pulmonary effort is normal.     Breath sounds: Normal breath sounds.  Abdominal:     General: Abdomen is flat. There is no distension.     Palpations: Abdomen is soft.     Tenderness: There is no abdominal tenderness. There is no guarding or rebound.  Musculoskeletal:        General: Normal range of motion.  Skin:    General: Skin is warm.     Capillary Refill: Capillary refill takes less than 2 seconds.  Neurological:     Mental Status: She is alert and oriented to person, place, and time.  Psychiatric:        Mood and Affect: Mood normal.        Behavior: Behavior normal.     ED Results / Procedures / Treatments   Labs (all labs ordered are listed, but only abnormal results are displayed) Labs Reviewed  CBC - Abnormal; Notable for the following components:      Result Value   RBC 3.42 (*)    Hemoglobin 10.2 (*)    HCT 30.5 (*)    Platelets 437 (*)    All other components within normal limits  COMPREHENSIVE METABOLIC PANEL - Abnormal; Notable for the following components:   Potassium 3.3 (*)    Glucose, Bld 100 (*)    Total Protein 6.4 (*)    All other components within normal limits  URINALYSIS, ROUTINE W REFLEX MICROSCOPIC - Abnormal; Notable for the following components:   Color, Urine STRAW (*)    All  other components within normal limits  I-STAT CHEM 8, ED - Abnormal; Notable for the following components:   Potassium 3.3 (*)    Calcium, Ion 1.14 (*)    Hemoglobin 10.2 (*)    HCT 30.0 (*)    All other components within normal limits  CBG MONITORING, ED - Abnormal; Notable for the following components:   Glucose-Capillary 108 (*)    All other components within normal limits  PROTIME-INR  APTT  DIFFERENTIAL  ETHANOL  RAPID URINE DRUG SCREEN, HOSP PERFORMED  CBC  CREATININE, SERUM  CBC  BASIC METABOLIC PANEL  MAGNESIUM  PHOSPHORUS  I-STAT CG4 LACTIC ACID, ED    EKG EKG Interpretation Date/Time:  Tuesday January 10 2024 17:29:53 EST Ventricular Rate:  60 PR Interval:  160 QRS Duration:  100 QT Interval:  460 QTC Calculation: 460 R Axis:   44  Text Interpretation: Sinus rhythm Confirmed by Estanislado Pandy [phone]) on 01/10/2024 8:12:55 PM  Radiology MR BRAIN WO CONTRAST Result Date: 01/10/2024 CLINICAL DATA:  Stroke, follow-up EXAM: MRI HEAD WITHOUT CONTRAST TECHNIQUE: Multiplanar, multiecho pulse sequences of the brain and surrounding structures were obtained without intravenous contrast. COMPARISON:  11/30/2020 MRI head, 01/10/2024 CT head FINDINGS: Evaluation is limited by motion artifact. Brain: No restricted diffusion to suggest acute or subacute infarct. No acute hemorrhage, mass, mass effect, or midline shift. No hydrocephalus or extra-axial collection. Pituitary and craniocervical junction within normal limits. No hemosiderin deposition to suggest remote hemorrhage. 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 Vascular: Poor visualization of the right vertebral artery flow void, which may be related to motion. Otherwise normal arterial flow voids. Skull and upper cervical spine: Normal marrow signal. Sinuses/Orbits: No acute finding. IMPRESSION: Evaluation is limited by motion artifact. Within this limitation, no acute intracranial process. No evidence of acute or subacute  infarct. Electronically Signed   By: Wiliam Ke M.D.   On: 01/10/2024 20:21   DG Chest Portable 1 View Result Date: 01/10/2024 CLINICAL DATA:  AMS EXAM: PORTABLE CHEST - 1 VIEW COMPARISON:  CT 12/20/2023 FINDINGS: Lungs are clear. Heart size and mediastinal contours are within normal limits. Aortic Atherosclerosis (ICD10-170.0). No effusion. Left humeral fixation hardware partially visualized. IMPRESSION: No acute cardiopulmonary disease. Electronically Signed   By: Corlis Leak M.D.   On: 01/10/2024 17:29   CT HEAD CODE STROKE WO CONTRAST Result Date: 01/10/2024 CLINICAL DATA:  Code stroke. EXAM: CT HEAD WITHOUT CONTRAST TECHNIQUE: Contiguous axial images were obtained from the base of the skull through the vertex without intravenous contrast. RADIATION DOSE REDUCTION: This exam was performed according to the departmental dose-optimization program which includes automated exposure control, adjustment of the mA and/or kV according to patient size and/or use of iterative reconstruction technique. COMPARISON:  No prior CT head available, correlation is made with MRI head 11/30/2020 FINDINGS: Brain: No evidence of acute infarction, hemorrhage, mass, mass effect, or midline shift. No hydrocephalus or extra-axial collection. 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 Vascular: No hyperdense vessel. Atherosclerotic calcifications in the intracranial carotid and vertebral arteries. Skull: Negative for fracture or focal lesion. Sinuses/Orbits: No acute finding. Other: The mastoid air cells are well aerated. ASPECTS [facility] Stroke Program Early CT Score) - Ganglionic level infarction (caudate, lentiform nuclei, internal capsule, insula, M1-M3 cortex): 7 - Supraganglionic infarction (M4-M6 cortex): 3 Total score (0-10 with 10 being normal): 10 IMPRESSION: No acute intracranial process.  ASPECTS is 10. Imaging results were communicated on 01/10/2024 at 5:10 pm to  provider STACK via secure text paging. Electronically Signed   By: Wiliam Ke M.D.   On: 01/10/2024 17:10    Procedures Procedures    Medications Ordered in ED Medications  potassium chloride SA (KLOR-CON M) CR tablet 40 mEq (has no administration in time range)  enoxaparin (LOVENOX) injection 40 mg (has no administration in time range)  acetaminophen (TYLENOL) tablet 650 mg (has no administration in time range)  prochlorperazine (COMPAZINE) injection 5 mg (has no administration in time  range)  polyethylene glycol (MIRALAX / GLYCOLAX) packet 17 g (has no administration in time range)  melatonin tablet 5 mg (has no administration in time range)  sodium chloride flush (NS) 0.9 % injection 3 mL (3 mLs Intravenous Given 01/10/24 1731)    ED Course/ Medical Decision Making/ A&P Clinical Course as of 01/10/24 2229  Tue Jan 10, 2024  2036 MR BRAIN WO CONTRAST IMPRESSION: Evaluation is limited by motion artifact. Within this limitation, no acute intracranial process. No evidence of acute or subacute infarct.   [TY]  2051 Husband notes that patient is still not her normal self, stating that she is seemingly hallucinating.  He did provide further history that she has been periodically taking some baclofen for shoulder pain.  Question medication related delirium as workup today largely reassuring her only significant abnormality is a mildly low potassium.  No sign of infectious process as she has no fever or tachycardia or leukocytosis.  Lactate is not elevated.  Unlikely infection.  Seizure less likely.  No transaminitis to suggest hepatobiliary disease or  hepatic encephalopathy.  Urine drug screen is negative.  Alcohol level negative.  Urine not consistent with urinary tract infection.  MRI brain and CT head not consistent with acute intracranial pathology.  EKG appears to be normal sinus rhythm without ST segment changes to indicate ischemia.  Given patient's persistent altered mentation that  was seemingly abrupt today we will admit for observation [TY]    Clinical Course User Index [TY] Coral Spikes, DO                                 Medical Decision Making 73 year old female presenting emergency department as a code stroke.  She is afebrile nontachycardic.  Hemodynamically stable.  Simile more encephalopathic rather than with focal neurologic deficits.  Neurology agrees.  Negative MRI for acute stroke.  Workup largely reassuring; see ED course for further MDM.  Amount and/or Complexity of Data Reviewed Independent Historian:     Details: Husband who is providing details, EMS also noted last known normal and stable vitals External Data Reviewed:     Details: Appears to have had humeral fracture repair on 1/13.  Taking Percocets. Labs: ordered. Decision-making details documented in ED Course. Radiology: ordered and independent interpretation performed. Decision-making details documented in ED Course.    Details: No acute obvious intracranial pathology on CT head. Discussion of management or test interpretation with external provider(s): Case discussed with neurology and hospitalist  Risk Prescription drug management. Decision regarding hospitalization.          Final Clinical Impression(s) / ED Diagnoses Final diagnoses:  Delirium    Rx / DC Orders ED Discharge Orders     None         Coral Spikes, DO 01/10/24 2229

## 2024-01-10 NOTE — Code Documentation (Signed)
Stroke Response Nurse Documentation Code Documentation  Melissa Bowman is a 73 y.o. female arriving to [facility]  on 01/10/2024 with past medical hx of HLD, HTN. On No antithrombotic. Code stroke was activated by EMS.   Patient from urgent care where she was LKW at 0000 and now complaining of slurred speech, dizziness, and left sided weakness. Pt's husband notes symptom onset of slurred speech at 0200. Symptoms worsened to confusion at 0530. Pt evaluated at urgent care and EMS was called for concerned for stroke.   Stroke team at the bedside on patient arrival. Labs drawn and patient cleared for CT by Dr. Maple Hudson. Patient to CT with team.   NIHSS 7, see documentation for details and code stroke times. Patient with disoriented, left leg weakness, Global aphasia , and dysarthria  on exam.   The following imaging was completed:  CT Head.   Patient is not a candidate for IV Thrombolytic due to LKW 0000. Patient is not a candidate for IR.   Care Plan: q2h NIHSS and VS.   Bedside handoff with ED RN Cristal Deer.    Melissa Bowman L Melissa Bowman  Rapid Response RN

## 2024-01-10 NOTE — H&P (Signed)
History and Physical  [name]:[id number] DOB: 09/15/1951 DOA: 01/10/2024  Referring physician: Dr. Maple Hudson, EDP  PCP: [name], DO  Outpatient Specialists: Orthopedic surgery Patient coming from: Home.  Chief Complaint: Code stroke, altered mental status.  HPI: Melissa Bowman is a 73 y.o. female with medical history significant for hypertension, hyperlipidemia, diet-controlled diabetes, osteopenia, mild cognitive impairment, who presents from home as a code stroke.  Reportedly around 2 AM she was noted by family members to have slurred speech.  Initially went to urgent care then she was sent to the ER for further workup.  In the ER confused with no focal deficits.  Brain MRI negative.  The patient recently had surgery 2 weeks ago and is on opiates and muscle relaxant.  Suspect acute confusion is due to to polypharmacy.  No evidence of active infective process, negative UA, no evidence of pneumonia on chest x-ray.  While in the ED, symptoms appear to be slowly improving.  EDP requested admission for further evaluation and observation.  Admitted by [facility], hospitalist service.  ED Course: Temperature 99.5.  BP 135/77, pulse 81, respiratory 20, O2 saturation 94% on room air.  Lab studies notable for serum potassium 3.3, hemoglobin 10.2.  WBC 6.0, platelet count 437.  Review of Systems: Review of systems as noted in the HPI. All other systems reviewed and are negative.   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 pylori infection 11/16/1993   Hiatal hernia    History of benign adrenal tumor 2007   [facility]   History of pneumonia 2007, 2008   Hyperlipidemia    Hypertension    IBS (irritable bowel syndrome)    Internal  hemorrhoids    Kyphosis    Long term current use of antipsychotic medication 08/19/2022   Memory loss    Menopause    Metabolic syndrome    OSA on CPAP    Prolonged Q-T interval on ECG 08/19/2022   Type 2 diabetes mellitus (HCC)    Vitamin D deficiency    Past Surgical History:  Procedure Laterality Date   abdominal wall lematoma of fabdd hys.     APPENDECTOMY     CARPAL TUNNEL RELEASE     CESAREAN SECTION     X2   COLONOSCOPY     INCISIONAL HERNIA REPAIR     ORIF HUMERUS FRACTURE Left 12/26/2023   Procedure: OPEN REDUCTION INTERNAL FIXATION (ORIF) HUMERUS FRACTURE;  Surgeon: Yolonda Kida, MD;  Location: [facility] OR;  Service: Orthopedics;  Laterality: Left;  90   right breast biopsy  09/1995   ROTATOR CUFF REPAIR  2002   right   ROTATOR CUFF REPAIR/ bone spur  08/30/2009   SHOULDER ARTHROSCOPY WITH ROTATOR CUFF REPAIR AND SUBACROMIAL DECOMPRESSION Right 01/19/2018   Procedure: REVISION RIGHT MINI OPEN ROTATOR CUFF REPAIR AND SUBACROMIAL DECOMPRESSION;  Surgeon: Jene Every, MD;  Location: [facility] ORS;  Service: Orthopedics;  Laterality: Right;  90 MINS   sleep apnea surgery  04/1995   torn cartiledge rt knee x2  05/13/2006   Dr. Sherlean Foot Smoc    TOTAL ABDOMINAL HYSTERECTOMY W/ BILATERAL SALPINGOOPHORECTOMY  07/1993   fibroids     Social  History:  reports that she has never smoked. She has never used smokeless tobacco. She reports that she does not drink alcohol and does not use drugs.   Allergies  Allergen Reactions   Statins Other (See Comments)    myalgias   E-Mycin [Erythromycin] Other (See Comments)    Medication intolerance stomach cramps   Estrogens Other (See Comments)    Estrogen replacement Therapy intolerance mood changes   Sulfa Antibiotics Other (See Comments)    Causes severe stomach cramps    Vioxx [Rofecoxib] Other (See Comments)    Unknown reaction/interaction. Possible stomach cramps    Family History  Problem Relation Age of Onset   Heart disease  Mother    Parkinsonism Father    Emphysema Father    Hip fracture Father    Heart failure Sister    Ovarian cancer Sister    COPD Sister    Lymphoma Sister    COPD Sister    COPD Brother    Hypertension Brother    Heart disease Brother    COPD Brother    Colon cancer Neg Hx    Esophageal cancer Neg Hx    Pancreatic cancer Neg Hx    Stomach cancer Neg Hx    Breast cancer Neg Hx       Prior to Admission medications   Medication Sig Start Date End Date Taking? Authorizing Provider  HYDROcodone-acetaminophen (NORCO/VICODIN) 5-325 MG tablet 1 tablet every 8 (eight) hours as needed for severe pain (pain score 7-10). 12/21/23  Yes [provider]  Ascorbic Acid (VITAMIN C) 1000 MG tablet Take 1,000 mg by mouth in the morning.    [provider]  CALCIUM PO Take 1,200 mg by mouth in the morning.    [provider]  Cholecalciferol (VITAMIN D-3) 5000 UNITS TABS Take 5,000 Units by mouth in the morning.    [provider]  desloratadine (CLARINEX) 5 MG tablet Take 5 mg by mouth in the morning. 10/14/23   [provider]  famotidine (PEPCID) 20 MG tablet Take 1 tablet (20 mg total) by mouth at bedtime. 08/25/23   Zehr, Shanda Bumps D, PA-C  fenofibrate 160 MG tablet TAKE 1 TABLET BY MOUTH EVERY DAY 11/07/23   Delynn Flavin M, DO  fluticasone (FLONASE) 50 MCG/ACT nasal spray Use 2 sprays in each  nostril daily Patient taking differently: 1 spray daily as needed for allergies. 11/19/15   Mechele Claude, MD  ibuprofen (ADVIL) 200 MG tablet Take 400 mg by mouth every 8 (eight) hours as needed (pain.).    [provider]  irbesartan-hydrochlorothiazide (AVALIDE) 150-12.5 MG tablet TAKE 1 TABLET BY MOUTH EVERY DAY 12/13/23   Delynn Flavin M, DO  MAGNESIUM CITRATE PO Take 1 tablet by mouth in the morning.    [provider]  memantine (NAMENDA) 5 MG tablet Take 1 tablet (5 mg total) by mouth 2 (two) times daily. 09/12/23   [name], DO  mirtazapine (REMERON) 45 MG tablet Take 1 tablet (45 mg total) by mouth at bedtime. 12/22/23   [name], DO  montelukast (SINGULAIR) 10 MG tablet Take 1 tablet (10 mg total) by mouth daily. Patient taking differently: Take 10 mg by mouth at bedtime. 09/12/23   [name], DO  omeprazole (PRILOSEC) 40 MG capsule Take 40 mg by mouth in the morning and at bedtime.    [provider]  ondansetron (ZOFRAN-ODT) 4 MG disintegrating tablet Take 1 tablet (4 mg total) by mouth every  8 (eight) hours as needed for nausea or vomiting. 12/26/23   Yolonda Kida, MD  oxyCODONE-acetaminophen (PERCOCET/ROXICET) 5-325 MG tablet Take 1 tablet by mouth every 6 (six) hours as needed for moderate pain (pain score 4-6) (pain). 12/26/23   Yolonda Kida, MD  pantoprazole (PROTONIX) 40 MG tablet Take 1 tablet (40 mg total) by mouth 2 (two) times daily before a meal. 11/18/23   Dettinger, Elige Radon, MD  PROAIR HFA 108 (90 BASE) MCG/ACT inhaler Use 2 puffs into the lungs  every 6 hours as needed for wheezing or shortness of  breath 11/19/15   Mechele Claude, MD  sertraline (ZOLOFT) 50 MG tablet Take 1 tablet (50 mg total) by mouth daily. 05/12/23 02/06/24  Elsie Lincoln, MD    Physical Exam: BP (!) 158/65   Pulse (!) 57   Temp (!) 97.5 F (36.4 C) (Oral)   Ht 5' (1.524 m)   Wt 60 kg   SpO2 100%   BMI 25.83 kg/m   General: 73 y.o. year-old female well developed well nourished in no acute distress.  Alert and oriented x3. Cardiovascular: Regular rate and rhythm with no rubs or gallops.  No thyromegaly or JVD noted.  No lower extremity edema. 2/4 pulses in all 4 extremities. Respiratory: Clear to auscultation with no wheezes or rales. Good inspiratory effort. Abdomen: Soft nontender nondistended with normal bowel sounds x4 quadrants. Muskuloskeletal: No cyanosis, clubbing or edema noted bilaterally Neuro: CN II-XII intact, strength, sensation, reflexes Skin: No ulcerative  lesions noted or rashes Psychiatry: Judgement and insight appear normal. Mood is appropriate for condition and setting          Labs on Admission:  Basic Metabolic Panel: Recent Labs  Lab 01/10/24 1738 01/10/24 1741  NA 140 141  K 3.3* 3.3*  CL 105 106  CO2 22  --   GLUCOSE 100* 94  BUN 15 15  CREATININE 0.97 1.00  CALCIUM 9.9  --    Liver Function Tests: Recent Labs  Lab 01/10/24 1738  AST 26  ALT 17  ALKPHOS 87  BILITOT 0.6  PROT 6.4*  ALBUMIN 3.7   No results for input(s): "LIPASE", "AMYLASE" in the last 168 hours. No results for input(s): "AMMONIA" in the last 168 hours. CBC: Recent Labs  Lab 01/10/24 1738 01/10/24 1741  WBC 6.0  --   NEUTROABS 2.6  --   HGB 10.2* 10.2*  HCT 30.5* 30.0*  MCV 89.2  --   PLT 437*  --    Cardiac Enzymes: No results for input(s): "CKTOTAL", "CKMB", "CKMBINDEX", "TROPONINI" in the last 168 hours.  BNP (last 3 results) No results for input(s): "BNP" in the last 8760 hours.  ProBNP (last 3 results) No results for input(s): "PROBNP" in the last 8760 hours.  CBG: Recent Labs  Lab 01/10/24 1650  GLUCAP 108*    Radiological Exams on Admission: MR BRAIN WO CONTRAST Result Date: 01/10/2024 CLINICAL DATA:  Stroke, follow-up EXAM: MRI HEAD WITHOUT CONTRAST TECHNIQUE: Multiplanar, multiecho pulse sequences of the brain and surrounding structures were obtained without intravenous contrast. COMPARISON:  11/30/2020 MRI head, 01/10/2024 CT head FINDINGS: Evaluation is limited by motion artifact. Brain: No restricted diffusion to suggest acute or subacute infarct. No acute hemorrhage, mass, mass effect, or midline shift. No hydrocephalus or extra-axial collection. Pituitary and craniocervical junction within normal limits. No hemosiderin deposition to suggest remote hemorrhage. 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 Vascular: Poor visualization of the right vertebral artery flow void, which may  be related to motion. Otherwise normal  arterial flow voids. Skull and upper cervical spine: Normal marrow signal. Sinuses/Orbits: No acute finding. IMPRESSION: Evaluation is limited by motion artifact. Within this limitation, no acute intracranial process. No evidence of acute or subacute infarct. Electronically Signed   By: Wiliam Ke M.D.   On: 01/10/2024 20:21   DG Chest Portable 1 View Result Date: 01/10/2024 CLINICAL DATA:  AMS EXAM: PORTABLE CHEST - 1 VIEW COMPARISON:  CT 12/20/2023 FINDINGS: Lungs are clear. Heart size and mediastinal contours are within normal limits. Aortic Atherosclerosis (ICD10-170.0). No effusion. Left humeral fixation hardware partially visualized. IMPRESSION: No acute cardiopulmonary disease. Electronically Signed   By: Corlis Leak M.D.   On: 01/10/2024 17:29   CT HEAD CODE STROKE WO CONTRAST Result Date: 01/10/2024 CLINICAL DATA:  Code stroke. EXAM: CT HEAD WITHOUT CONTRAST TECHNIQUE: Contiguous axial images were obtained from the base of the skull through the vertex without intravenous contrast. RADIATION DOSE REDUCTION: This exam was performed according to the departmental dose-optimization program which includes automated exposure control, adjustment of the mA and/or kV according to patient size and/or use of iterative reconstruction technique. COMPARISON:  No prior CT head available, correlation is made with MRI head 11/30/2020 FINDINGS: Brain: No evidence of acute infarction, hemorrhage, mass, mass effect, or midline shift. No hydrocephalus or extra-axial collection. 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 Vascular: No hyperdense vessel. Atherosclerotic calcifications in the intracranial carotid and vertebral arteries. Skull: Negative for fracture or focal lesion. Sinuses/Orbits: No acute finding. Other: The mastoid air cells are well aerated. ASPECTS [facility] Stroke Program Early CT Score) - Ganglionic level infarction (caudate, lentiform nuclei,  internal capsule, insula, M1-M3 cortex): 7 - Supraganglionic infarction (M4-M6 cortex): 3 Total score (0-10 with 10 being normal): 10 IMPRESSION: No acute intracranial process.  ASPECTS is 10. Imaging results were communicated on 01/10/2024 at 5:10 pm to provider STACK via secure text paging. Electronically Signed   By: Wiliam Ke M.D.   On: 01/10/2024 17:10    EKG: I independently viewed the EKG done and my findings are as followed: Sinus rhythm rate of 60.  Nonspecific ST-T changes.  QTc 460.  Assessment/Plan Present on Admission:  AMS (altered mental status)  Principal Problem:   AMS (altered mental status)  Acute metabolic encephalopathy, suspect related pharmacy Presented as a code stroke, stroke was ruled out. Confusion, hallucination Opiates and muscle relaxant for postop Reorient as needed  Bradycardia Heart rate in the 50s Hold off AV nodal blockade agent  Hypokalemia Repleted orally.  Anemia of chronic disease Appears to be at her baseline hemoglobin No overt bleeding reported.  Physical debility PT OT assessment Fall precautions.   Time: 75 minutes.   DVT prophylaxis: Subcu Lovenox daily  Code Status: Full code  Family Communication: None at bedside.  Disposition Plan: Admitted to telemetry medical unit.  Consults called: None.  Admission status: Observation status.   Status is: Observation    Darlin Drop MD [facility] Pager [phone]  If 7PM-7AM, please contact night-coverage [email or web address] Password [facility]  01/10/2024, 9:26 PM

## 2024-01-10 NOTE — ED Provider Notes (Addendum)
[facility]    CSN: [id number] Arrival date & time: 01/10/24  1523      History   Chief Complaint Chief Complaint  Patient presents with   Altered Mental Status   Loss of Consciousness    HPI Melissa Bowman is a 73 y.o. female.   Melissa Bowman is a 73 y.o. female presenting for chief complaint of Altered Mental Status that started abruptly at 2am this morning. She went to bed last night around 12 midnight and woke up confused per husband at bedside.  She has been confused all day today and her speech has been slurred and "drunk sounding" per husband" since she woke up this morning around 5-6am. She is normally very alert and aware of where she is, the date, and what is happening in her surroundings but husband states she has been increasingly forgetful today. History of diabetes, sugars normally run in the low 100s, she does not take meds for diabetes as it is diet controlled. Sugar 2 hours ago was 130s per husband.   Patient denies extremity weakness, new numbness/tingling (neuropathy to legs at baseline), dizziness, vision changes, nausea, vomiting, urinary symptoms, rash, chest pain, shortness of breath and fever/chills. No history of a-fib. Not on blood thinners. History of memory loss at baseline and she has been worked up for dementia by neurology with inconclusive findings. Additionally recently had surgery to fix humerus fracture 2 weeks ago and had ortho follow-up today. She has never had a stroke before.    Altered Mental Status Loss of Consciousness   Past Medical History:  Diagnosis Date   Allergic rhinitis    Allergy to ertapenem    Anxiety 08/27/2011   Anxiety and depression    Asthmatic bronchitis    Bilateral carpal tunnel syndrome    Condyloma acuminata    Diverticulosis    Eczema    GERD (gastroesophageal reflux disease)    H. pylori infection 11/16/1993   Hiatal hernia    History of benign adrenal tumor 2007   [facility]   History of pneumonia  2007, 2008   Hyperlipidemia    Hypertension    IBS (irritable bowel syndrome)    Internal hemorrhoids    Kyphosis    Long term current use of antipsychotic medication 08/19/2022   Memory loss    Menopause    Metabolic syndrome    OSA on CPAP    Prolonged Q-T interval on ECG 08/19/2022   Type 2 diabetes mellitus (HCC)    Vitamin D deficiency     Patient Active Problem List   Diagnosis Date Noted   Loss of weight 08/25/2023   Mild cognitive impairment 12/28/2022   Hearing decreased, bilateral 09/22/2022   Mild cognitive disorder 09/10/2022   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/2022   Insomnia 08/19/2022   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   Sleep apnea 12/24/2021   Heart murmur 12/24/2021   Hyperlipidemia associated with type 2 diabetes mellitus (HCC) 10/30/2019   Osteoarthritis of multiple joints 04/02/2019   Right rotator cuff tear 01/19/2018   Osteopenia after menopause 12/26/2013   Type 2 diabetes mellitus (HCC) 10/31/2013   Hypertension associated with diabetes (HCC) 05/14/2013   Allergic rhinitis 05/14/2013   Vitamin D deficiency 05/14/2013   Hemorrhage of rectum and anus 09/15/2011   Dysphagia, pharyngoesophageal phase 09/15/2011   Chronic RUQ pain 09/15/2011   Diverticulosis of colon 09/15/2011   BRBPR (bright red blood per rectum) 08/27/2011   Internal hemorrhoids 08/27/2011  Esophageal reflux 08/27/2011   Abdominal pain 08/27/2011   Esophageal dysphagia 08/27/2011   Obesity 08/27/2011   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     APPENDECTOMY     CARPAL TUNNEL RELEASE     CESAREAN SECTION     X2   COLONOSCOPY     INCISIONAL HERNIA REPAIR     ORIF HUMERUS FRACTURE Left 12/26/2023   Procedure: OPEN REDUCTION INTERNAL FIXATION (ORIF) HUMERUS FRACTURE;  Surgeon: Yolonda Kida, MD;  Location: [facility] OR;  Service: Orthopedics;  Laterality: Left;  90   right breast biopsy  09/1995   ROTATOR CUFF REPAIR  2002   right   ROTATOR CUFF REPAIR/ bone spur  08/30/2009   SHOULDER ARTHROSCOPY WITH ROTATOR CUFF REPAIR AND SUBACROMIAL DECOMPRESSION Right 01/19/2018   Procedure: REVISION RIGHT MINI OPEN ROTATOR CUFF REPAIR AND SUBACROMIAL DECOMPRESSION;  Surgeon: Jene Every, MD;  Location: [facility] ORS;  Service: Orthopedics;  Laterality: Right;  90 MINS   sleep apnea surgery  04/1995   torn cartiledge rt knee x2  05/13/2006   Dr. Sherlean Foot Smoc    TOTAL ABDOMINAL HYSTERECTOMY W/ BILATERAL SALPINGOOPHORECTOMY  07/1993   fibroids     OB History   No obstetric history on file.      Home Medications    Prior to Admission medications   Medication Sig Start Date End Date Taking? Authorizing Provider  Ascorbic Acid (VITAMIN C) 1000 MG tablet Take 1,000 mg by mouth in the morning.    [provider]  CALCIUM PO Take 1,200 mg by mouth in the morning.    [provider]  Cholecalciferol (VITAMIN D-3) 5000 UNITS TABS Take 5,000 Units by mouth in the morning.    [provider]  desloratadine (CLARINEX) 5 MG tablet Take 5 mg by mouth in the morning. 10/14/23   [provider]  famotidine (PEPCID) 20 MG tablet Take 1 tablet (20 mg total) by mouth at bedtime. 08/25/23   Zehr, Shanda Bumps D, PA-C  fenofibrate 160 MG tablet TAKE 1 TABLET BY MOUTH EVERY DAY 11/07/23   Delynn Flavin M, DO  fluticasone (FLONASE) 50 MCG/ACT nasal spray Use 2 sprays in each  nostril daily Patient taking differently: 1 spray daily as needed for allergies. 11/19/15   Mechele Claude, MD  ibuprofen (ADVIL) 200 MG tablet Take 400 mg by mouth every 8 (eight) hours as needed (pain.).    [provider]  irbesartan-hydrochlorothiazide (AVALIDE) 150-12.5 MG tablet TAKE 1 TABLET BY MOUTH EVERY DAY 12/13/23   Delynn Flavin M, DO  MAGNESIUM CITRATE PO Take 1 tablet by mouth  in the morning.    [provider]  memantine (NAMENDA) 5 MG tablet Take 1 tablet (5 mg total) by mouth 2 (two) times daily. 09/12/23   [name], DO  mirtazapine (REMERON) 45 MG tablet Take 1 tablet (45 mg total) by mouth at bedtime. 12/22/23   [name], DO  montelukast (SINGULAIR) 10 MG tablet Take 1 tablet (10 mg total) by mouth daily. Patient taking differently: Take 10 mg by mouth at bedtime. 09/12/23   [name], DO  omeprazole (PRILOSEC) 40 MG capsule Take 40 mg by mouth in the morning and at bedtime.    [provider]  ondansetron (ZOFRAN-ODT) 4 MG disintegrating tablet Take 1 tablet (4 mg total) by mouth every 8 (eight)  hours as needed for nausea or vomiting. 12/26/23   Yolonda Kida, MD  oxyCODONE-acetaminophen (PERCOCET/ROXICET) 5-325 MG tablet Take 1 tablet by mouth every 6 (six) hours as needed for moderate pain (pain score 4-6) (pain). 12/26/23   Yolonda Kida, MD  pantoprazole (PROTONIX) 40 MG tablet Take 1 tablet (40 mg total) by mouth 2 (two) times daily before a meal. 11/18/23   Dettinger, Elige Radon, MD  PROAIR HFA 108 (90 BASE) MCG/ACT inhaler Use 2 puffs into the lungs  every 6 hours as needed for wheezing or shortness of  breath 11/19/15   Mechele Claude, MD  sertraline (ZOLOFT) 50 MG tablet Take 1 tablet (50 mg total) by mouth daily. 05/12/23 02/06/24  Elsie Lincoln, MD    Family History Family History  Problem Relation Age of Onset   Heart disease Mother    Parkinsonism Father    Emphysema Father    Hip fracture Father    Heart failure Sister    Ovarian cancer Sister    COPD Sister    Lymphoma Sister    COPD Sister    COPD Brother    Hypertension Brother    Heart disease Brother    COPD Brother    Colon cancer Neg Hx    Esophageal cancer Neg Hx    Pancreatic cancer Neg Hx    Stomach cancer Neg Hx    Breast cancer Neg Hx     Social History Social History   Tobacco Use   Smoking status: Never    Smokeless tobacco: Never  Vaping Use   Vaping status: Never Used  Substance Use Topics   Alcohol use: No   Drug use: No     Allergies   Statins, E-mycin [erythromycin], Estrogens, Sulfa antibiotics, and Vioxx [rofecoxib]   Review of Systems Review of Systems  Cardiovascular:  Positive for syncope.  Per HPI   Physical Exam Triage Vital Signs ED Triage Vitals  Encounter Vitals Group     BP 01/10/24 1552 (!) 158/74     Systolic BP Percentile --      Diastolic BP Percentile --      Pulse Rate 01/10/24 1552 (!) 56     Resp 01/10/24 1552 17     Temp --      Temp Source 01/10/24 1532 Oral     SpO2 01/10/24 1552 99 %     Weight --      Height --      Head Circumference --      Peak Flow --      Pain Score 01/10/24 1531 4     Pain Loc --      Pain Education --      Exclude from Growth Chart --    No data found.  Updated Vital Signs BP (!) 158/74 (BP Location: Right Arm)   Pulse (!) 56   Resp 17   SpO2 99%   Visual Acuity Right Eye Distance:   Left Eye Distance:   Bilateral Distance:    Right Eye Near:   Left Eye Near:    Bilateral Near:     Physical Exam Vitals and nursing note reviewed.  Constitutional:      Appearance: She is not ill-appearing or toxic-appearing.     Comments: Pleasant patient seated in wheel chair in no acute distress.   HENT:     Head: Normocephalic and atraumatic.     Right Ear: Hearing and external ear normal.  Left Ear: Hearing and external ear normal.     Nose: Nose normal.     Mouth/Throat:     Lips: Pink.  Eyes:     General: Lids are normal. Vision grossly intact. Gaze aligned appropriately. No visual field deficit.    Conjunctiva/sclera: Conjunctivae normal.     Pupils: Pupils are equal, round, and reactive to light.  Cardiovascular:     Rate and Rhythm: Normal rate and regular rhythm.     Heart sounds: Normal heart sounds, S1 normal and S2 normal.  Pulmonary:     Effort: Pulmonary effort is normal. No respiratory  distress.     Breath sounds: Normal breath sounds and air entry.  Musculoskeletal:     Cervical back: Neck supple.  Skin:    General: Skin is warm and dry.     Capillary Refill: Capillary refill takes less than 2 seconds.     Findings: No rash.  Neurological:     General: No focal deficit present.     Mental Status: She is alert and oriented to person, place, and time.     Cranial Nerves: Cranial nerve deficit present. No dysarthria or facial asymmetry.     Sensory: No sensory deficit.     Motor: Weakness and pronator drift (left arm) present. No tremor, atrophy, abnormal muscle tone or seizure activity.     Coordination: Coordination normal.     Gait: Gait abnormal.     Comments: Speech is slurred, subtle left facial droop, subtle left arm weakness and pronator drift.   Psychiatric:        Mood and Affect: Mood normal.        Speech: Speech normal.        Behavior: Behavior normal.        Thought Content: Thought content normal.        Judgment: Judgment normal.      UC Treatments / Results  Labs (all labs ordered are listed, but only abnormal results are displayed) Labs Reviewed  POCT FASTING CBG [facility] MANUAL ENTRY - Abnormal; Notable for the following components:      Result Value   POCT Glucose ([facility]) 69 (*)    All other components within normal limits    EKG   Radiology No results found.  Procedures Procedures (including critical care time)  Medications Ordered in UC Medications - No data to display  Initial Impression / Assessment and Plan / UC Course  I have reviewed the triage vital signs and the nursing notes.  Pertinent labs & imaging results that were available during my care of the patient were reviewed by me and considered in my medical decision making (see chart for details).   1. Altered mental status New neuro symptoms concerning for CVA versus UTI, dehydration, etc.  VSS. CBG 69.  EMS called to transport patient to the ER for further workup and  evaluation to rule out acute intracranial abnormality.  Discussed clinical concerns/exam findings leading to recommendation for further workup in the ER setting and risks of deferring ER visit with patient/family. Patient/family express understanding and agreement with plan, discharged to ER via EMS.  18 gauge IV placed in clinic prior to discharge.  Final Clinical Impressions(s) / UC Diagnoses   Final diagnoses:  Altered mental status, unspecified altered mental status type   Discharge Instructions   None    ED Prescriptions   None    PDMP not reviewed this encounter.   Carlisle Beers, [place] 01/10/24 [phone]  Carlisle Beers, [place] 01/10/24 1750

## 2024-01-10 NOTE — ED Notes (Signed)
Patient transported to MRI

## 2024-01-10 NOTE — ED Notes (Signed)
Patient is being discharged from the Urgent Care and sent to the Emergency Department via EMS . Per Santina Evans FNP, patient is in need of higher level of care due to Altered mental status, possible LOC. Patient is aware and verbalizes understanding of plan of care.  Vitals:   01/10/24 1552  BP: (!) 158/74  Pulse: (!) 56  Resp: 17  SpO2: 99%

## 2024-01-10 NOTE — ED Notes (Signed)
RN received multiple calls from CCMD. NT assisting pt in room and hooking up to cardiac monitor.

## 2024-01-11 DIAGNOSIS — R4182 Altered mental status, unspecified: Secondary | ICD-10-CM | POA: Diagnosis not present

## 2024-01-11 LAB — BLOOD GAS, VENOUS
Acid-base deficit: 0.2 mmol/L (ref 0.0–2.0)
Bicarbonate: 22.1 mmol/L (ref 20.0–28.0)
O2 Saturation: 95.2 %
Patient temperature: 37.1
pCO2, Ven: 29 mm[Hg] — ABNORMAL LOW (ref 44–60)
pH, Ven: 7.49 — ABNORMAL HIGH (ref 7.25–7.43)
pO2, Ven: 66 mm[Hg] — ABNORMAL HIGH (ref 32–45)

## 2024-01-11 LAB — I-STAT VENOUS BLOOD GAS, ED
Acid-Base Excess: 0 mmol/L (ref 0.0–2.0)
Bicarbonate: 22.9 mmol/L (ref 20.0–28.0)
Calcium, Ion: 1.23 mmol/L (ref 1.15–1.40)
HCT: 29 % — ABNORMAL LOW (ref 36.0–46.0)
Hemoglobin: 9.9 g/dL — ABNORMAL LOW (ref 12.0–15.0)
O2 Saturation: 96 %
Potassium: 4.2 mmol/L (ref 3.5–5.1)
Sodium: 139 mmol/L (ref 135–145)
TCO2: 24 mmol/L (ref 22–32)
pCO2, Ven: 29.2 mm[Hg] — ABNORMAL LOW (ref 44–60)
pH, Ven: 7.502 — ABNORMAL HIGH (ref 7.25–7.43)
pO2, Ven: 76 mm[Hg] — ABNORMAL HIGH (ref 32–45)

## 2024-01-11 LAB — CBC
HCT: 27.7 % — ABNORMAL LOW (ref 36.0–46.0)
Hemoglobin: 9.4 g/dL — ABNORMAL LOW (ref 12.0–15.0)
MCH: 30 pg (ref 26.0–34.0)
MCHC: 33.9 g/dL (ref 30.0–36.0)
MCV: 88.5 fL (ref 80.0–100.0)
Platelets: 405 10*3/uL — ABNORMAL HIGH (ref 150–400)
RBC: 3.13 MIL/uL — ABNORMAL LOW (ref 3.87–5.11)
RDW: 14.4 % (ref 11.5–15.5)
WBC: 5.1 10*3/uL (ref 4.0–10.5)
nRBC: 0 % (ref 0.0–0.2)

## 2024-01-11 LAB — BASIC METABOLIC PANEL
Anion gap: 13 (ref 5–15)
BUN: 14 mg/dL (ref 8–23)
CO2: 19 mmol/L — ABNORMAL LOW (ref 22–32)
Calcium: 9.1 mg/dL (ref 8.9–10.3)
Chloride: 104 mmol/L (ref 98–111)
Creatinine, Ser: 1.25 mg/dL — ABNORMAL HIGH (ref 0.44–1.00)
GFR, Estimated: 46 mL/min — ABNORMAL LOW (ref >60)
Glucose, Bld: 126 mg/dL — ABNORMAL HIGH (ref 70–99)
Potassium: 3.1 mmol/L — ABNORMAL LOW (ref 3.5–5.1)
Sodium: 136 mmol/L (ref 135–145)

## 2024-01-11 LAB — MAGNESIUM: Magnesium: 1.9 mg/dL (ref 1.7–2.4)

## 2024-01-11 LAB — PHOSPHORUS: Phosphorus: 3.3 mg/dL (ref 2.5–4.6)

## 2024-01-11 LAB — AMMONIA: Ammonia: 37 umol/L — ABNORMAL HIGH (ref 9–35)

## 2024-01-11 LAB — TSH: TSH: 2.618 u[IU]/mL (ref 0.350–4.500)

## 2024-01-11 MED ORDER — SERTRALINE HCL 50 MG PO TABS
50.0000 mg | ORAL_TABLET | Freq: Every day | ORAL | Status: DC
  Administered 2024-01-11 – 2024-01-16 (×6): 50 mg via ORAL
  Filled 2024-01-11 (×6): qty 1

## 2024-01-11 MED ORDER — POTASSIUM CHLORIDE CRYS ER 20 MEQ PO TBCR
40.0000 meq | EXTENDED_RELEASE_TABLET | ORAL | Status: AC
  Administered 2024-01-11 (×2): 40 meq via ORAL
  Filled 2024-01-11 (×2): qty 2

## 2024-01-11 MED ORDER — MONTELUKAST SODIUM 10 MG PO TABS
10.0000 mg | ORAL_TABLET | Freq: Every day | ORAL | Status: DC
  Administered 2024-01-11 – 2024-01-16 (×6): 10 mg via ORAL
  Filled 2024-01-11 (×5): qty 1

## 2024-01-11 MED ORDER — LACTATED RINGERS IV SOLN
INTRAVENOUS | Status: DC
Start: 2024-01-11 — End: 2024-01-11

## 2024-01-11 MED ORDER — PANTOPRAZOLE SODIUM 40 MG PO TBEC
40.0000 mg | DELAYED_RELEASE_TABLET | Freq: Two times a day (BID) | ORAL | Status: DC
  Administered 2024-01-11 – 2024-01-16 (×11): 40 mg via ORAL
  Filled 2024-01-11 (×11): qty 1

## 2024-01-11 NOTE — ED Notes (Signed)
Changed patient brief patient is resting with call bell in reach

## 2024-01-11 NOTE — Progress Notes (Signed)
PROGRESS NOTE    Melissa [facility]man  WJX:[id number] DOB: 09-04-1951 DOA: 01/10/2024 PCP: [name], DO   Brief Narrative:    Melissa Bowman is a 73 y.o. female with medical history significant for hypertension, hyperlipidemia, diet-controlled diabetes, osteopenia, mild cognitive impairment, who presents from home as a code stroke.  Reportedly around 2 AM she was noted by family members to have slurred speech.  Initially went to urgent care then she was sent to the ER for further workup.  Brain MRI negative.  Seen by neurology with no further indication for TIA workup, continue to workup metabolic causes.  PT/OT evaluation pending.  Assessment & Plan:   Principal Problem:   AMS (altered mental status)  Assessment and Plan:   Acute metabolic encephalopathy, suspect related to polypharmacy Presented as a code stroke, stroke was ruled out. Continues to have confusion that is slowly improving No further indication for TIA workup per neurology, appreciate further recommendations TSH, ammonia, and VBG ordered for further evaluation PT/OT evaluation pending   Bradycardia, transient Heart rate in the 50s Last TSH 1.910 Hold off AV nodal blockade agent Continue to monitor on telemetry   Hypokalemia Repleted orally. Recheck in a.m.   Anemia of chronic disease Appears to be at her baseline hemoglobin No overt bleeding reported.   Physical debility PT OT assessment Fall precautions.   Chronic anxiety/depression Resume home regimen   GERD Resume home regimen    DVT prophylaxis: Lovenox Code Status: Full Family Communication: Husband at bedside. Disposition Plan:  Status is: Observation The patient will require care spanning > 2 midnights and should be moved to inpatient because: Need for IV fluid.   Consultants:  Neurology  Procedures:  None  Antimicrobials:  None   Subjective: Patient seen and evaluated today and continues to have some mild confusion,  but this is slowly improving.  She can tell me where she is and the year, but gets confused on her name at times.  Objective: Vitals:   01/11/24 0558 01/11/24 0900 01/11/24 0936 01/11/24 1017  BP: 136/81 (!) 140/76  129/79  Pulse: 79 (!) 58  (!) 56  Resp: 14 19  20   Temp:   98.4 F (36.9 C) 98.6 F (37 C)  TempSrc:   Oral Oral  SpO2: 95% 95%  99%  Weight:      Height:       No intake or output data in the 24 hours ending 01/11/24 1041 Filed Weights   01/10/24 1600 01/10/24 1722  Weight: 59.6 kg 60 kg    Examination:  General exam: Appears calm and comfortable, moderately confused Respiratory system: Clear to auscultation. Respiratory effort normal. Cardiovascular system: S1 & S2 heard, RRR.  Gastrointestinal system: Abdomen is soft Central nervous system: Alert and awake Extremities: No edema Skin: No significant lesions noted Psychiatry: Flat affect.    Data Reviewed: I have personally reviewed following labs and imaging studies  CBC: Recent Labs  Lab 01/10/24 1738 01/10/24 1741 01/10/24 2322 01/11/24 0548  WBC 6.0  --  5.7 5.1  NEUTROABS 2.6  --   --   --   HGB 10.2* 10.2* 9.3* 9.4*  HCT 30.5* 30.0* 27.6* 27.7*  MCV 89.2  --  88.7 88.5  PLT 437*  --  394 405*   Basic Metabolic Panel: Recent Labs  Lab 01/10/24 1738 01/10/24 1741 01/10/24 2322 01/11/24 0548  NA 140 141  --  136  K 3.3* 3.3*  --  3.1*  CL 105  106  --  104  CO2 22  --   --  19*  GLUCOSE 100* 94  --  126*  BUN 15 15  --  14  CREATININE 0.97 1.00 0.98 1.25*  CALCIUM 9.9  --   --  9.1  MG  --   --   --  1.9  PHOS  --   --   --  3.3   GFR: Estimated Creatinine Clearance: 32.5 mL/min (A) (by C-G formula based on SCr of 1.25 mg/dL (H)). Liver Function Tests: Recent Labs  Lab 01/10/24 1738  AST 26  ALT 17  ALKPHOS 87  BILITOT 0.6  PROT 6.4*  ALBUMIN 3.7   No results for input(s): "LIPASE", "AMYLASE" in the last 168 hours. No results for input(s): "AMMONIA" in the last 168  hours. Coagulation Profile: Recent Labs  Lab 01/10/24 1738  INR 1.0   Cardiac Enzymes: No results for input(s): "CKTOTAL", "CKMB", "CKMBINDEX", "TROPONINI" in the last 168 hours. BNP (last 3 results) No results for input(s): "PROBNP" in the last 8760 hours. HbA1C: No results for input(s): "HGBA1C" in the last 72 hours. CBG: Recent Labs  Lab 01/10/24 1650  GLUCAP 108*   Lipid Profile: No results for input(s): "CHOL", "HDL", "LDLCALC", "TRIG", "CHOLHDL", "LDLDIRECT" in the last 72 hours. Thyroid Function Tests: No results for input(s): "TSH", "T4TOTAL", "FREET4", "T3FREE", "THYROIDAB" in the last 72 hours. Anemia Panel: No results for input(s): "VITAMINB12", "FOLATE", "FERRITIN", "TIBC", "IRON", "RETICCTPCT" in the last 72 hours. Sepsis Labs: Recent Labs  Lab 01/10/24 1741  LATICACIDVEN 1.9    No results found for this or any previous visit (from the past 240 hours).       Radiology Studies: MR BRAIN WO CONTRAST Result Date: 01/10/2024 CLINICAL DATA:  Stroke, follow-up EXAM: MRI HEAD WITHOUT CONTRAST TECHNIQUE: Multiplanar, multiecho pulse sequences of the brain and surrounding structures were obtained without intravenous contrast. COMPARISON:  11/30/2020 MRI head, 01/10/2024 CT head FINDINGS: Evaluation is limited by motion artifact. Brain: No restricted diffusion to suggest acute or subacute infarct. No acute hemorrhage, mass, mass effect, or midline shift. No hydrocephalus or extra-axial collection. Pituitary and craniocervical junction within normal limits. No hemosiderin deposition to suggest remote hemorrhage. 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 Vascular: Poor visualization of the right vertebral artery flow void, which may be related to motion. Otherwise normal arterial flow voids. Skull and upper cervical spine: Normal marrow signal. Sinuses/Orbits: No acute finding. IMPRESSION: Evaluation is limited by motion artifact. Within this limitation, no acute  intracranial process. No evidence of acute or subacute infarct. Electronically Signed   By: Wiliam Ke M.D.   On: 01/10/2024 20:21   DG Chest Portable 1 View Result Date: 01/10/2024 CLINICAL DATA:  AMS EXAM: PORTABLE CHEST - 1 VIEW COMPARISON:  CT 12/20/2023 FINDINGS: Lungs are clear. Heart size and mediastinal contours are within normal limits. Aortic Atherosclerosis (ICD10-170.0). No effusion. Left humeral fixation hardware partially visualized. IMPRESSION: No acute cardiopulmonary disease. Electronically Signed   By: Corlis Leak M.D.   On: 01/10/2024 17:29   CT HEAD CODE STROKE WO CONTRAST Result Date: 01/10/2024 CLINICAL DATA:  Code stroke. EXAM: CT HEAD WITHOUT CONTRAST TECHNIQUE: Contiguous axial images were obtained from the base of the skull through the vertex without intravenous contrast. RADIATION DOSE REDUCTION: This exam was performed according to the departmental dose-optimization program which includes automated exposure control, adjustment of the mA and/or kV according to patient size and/or use of iterative reconstruction technique. COMPARISON:  No prior CT  head available, correlation is made with MRI head 11/30/2020 FINDINGS: Brain: No evidence of acute infarction, hemorrhage, mass, mass effect, or midline shift. No hydrocephalus or extra-axial collection. 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 Vascular: No hyperdense vessel. Atherosclerotic calcifications in the intracranial carotid and vertebral arteries. Skull: Negative for fracture or focal lesion. Sinuses/Orbits: No acute finding. Other: The mastoid air cells are well aerated. ASPECTS [facility] Stroke Program Early CT Score) - Ganglionic level infarction (caudate, lentiform nuclei, internal capsule, insula, M1-M3 cortex): 7 - Supraganglionic infarction (M4-M6 cortex): 3 Total score (0-10 with 10 being normal): 10 IMPRESSION: No acute intracranial process.  ASPECTS is 10.  Imaging results were communicated on 01/10/2024 at 5:10 pm to provider STACK via secure text paging. Electronically Signed   By: Wiliam Ke M.D.   On: 01/10/2024 17:10        Scheduled Meds:  enoxaparin (LOVENOX) injection  40 mg Subcutaneous Q24H   montelukast  10 mg Oral Daily   pantoprazole  40 mg Oral BID AC   potassium chloride  40 mEq Oral Q4H   sertraline  50 mg Oral Daily   Continuous Infusions:  lactated ringers       LOS: 0 days    Time spent: 55 minutes    Makana Feigel Hoover Brunette, DO [facility]  If 7PM-7AM, please contact night-coverage [email or web address] 01/11/2024, 10:41 AM

## 2024-01-11 NOTE — Evaluation (Signed)
Physical Therapy Evaluation Patient Details Name: Melissa Bowman MRN: [id number] DOB: 1951-07-27 Today's Date: 01/11/2024  History of Present Illness  Pt is 73 yo female admitted on 01/10/24 with AMS and difficulty speaking.  Brain MRI negative for CVA.  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  She had recent ORIF L humerus on 12/26/23, per op note sling for comfort, can use arm for ADL, no lifting over 2 pounds, did not mention weighbearing status (plan to tx as NWB since has 2 pound weight restriction). She had hx including HTN, HLD, DM, osteopenia, mild cognitive impairment.  Clinical Impression  Pt admitted with above diagnosis. Pt is questionable historian but did report she was home with spouse.  States independent prior to L UE surgery and spouse assisting since.  She does live in split level home.  Today, pt with mild confusion (unsure of baseline). She required mod A to sit at EOB.  Once at EOB, pt having sporadic/intermittent lightheadedness and hot flashes.  She did not have orthostatic hypotension and all VSS.  When pt had episodes her head would drop briefly.  Did not progress to standing due to this.  Notified RN and MD.   Pt currently with functional limitations due to the deficits listed below (see PT Problem List). Pt will benefit from acute skilled PT to increase their independence and safety with mobility to allow discharge.  At this time recommend, Patient will benefit from continued inpatient follow up therapy, <3 hours/day at d/c.          If plan is discharge home, recommend the following: A lot of help with walking and/or transfers;A lot of help with bathing/dressing/bathroom   Can travel by private vehicle   No    Equipment Recommendations Wheelchair cushion (measurements PT);Wheelchair (measurements PT)  Recommendations for Other Services       Functional Status Assessment Patient has had a recent decline in their functional status  and demonstrates the ability to make significant improvements in function in a reasonable and predictable amount of time.     Precautions / Restrictions Precautions Precautions: Fall;Other (comment) Precaution Comments: Pt with recent L humerus ORIF 12/26/23, per op note:sling for comfort, can use arm for ADL, no lifting over 2 pounds, did not mention weighbearing status will treat NWB Restrictions Weight Bearing Restrictions Per Provider Order: Yes LUE Weight Bearing Per Provider Order: Non weight bearing      Mobility  Bed Mobility Overal bed mobility: Needs Assistance Bed Mobility: Supine to Sit, Sit to Supine     Supine to sit: Mod assist Sit to supine: Min assist   General bed mobility comments: Mod A to lift trunk; min A for legs back to bed; cues to not push with L UE (noted 2 # weight restriction so treating as NWB)    Transfers Overall transfer level: Needs assistance                 General transfer comment: Deferred standing due to intermittent/sporadic lightheadedness (see comments)    Ambulation/Gait                  Stairs            Wheelchair Mobility     Tilt Bed    Modified Rankin (Stroke Patients Only)       Balance Overall balance assessment: Needs assistance Sitting-balance support: No upper extremity supported Sitting balance-Leahy Scale: Fair Sitting balance - Comments: Needing min A at times  Standing balance comment: deferred                             Pertinent Vitals/Pain Pain Assessment Pain Assessment: No/denies pain    Home Living Family/patient expects to be discharged to:: Private residence Living Arrangements: Spouse/significant other Available Help at Discharge: Family;Available 24 hours/day Type of Home: House Home Access: Stairs to enter     Alternate Level Stairs-Number of Steps: Pt lives in split level home reports 2 sets of 6-7 steps if enter from basement or 4-5 if uses back  door.  Does have rail, not sure what side.  States once she gets upstairs to [employer] - has been staying there in recliner with bathroom on that level since her surgery. Home Layout: Multi-level Home Equipment: Cane - single point;Shower seat;Grab bars - tub/shower      Prior Function Prior Level of Function : Needs assist;Patient poor historian/Family not available             Mobility Comments: Pt reports prior to surgery independent with cane.  States now her husband assist in the morning but by afternoon ambulates on her own with cane.  She is questionable historian - initially reporting levels of assist stated , but then stating "Have I been home since my surgery?" ADLs Comments: Pt reports was independent with cooking, cleaning, and adls prior to L UE ORIF.  Since that time spouse assisting with all adls.     Extremity/Trunk Assessment   Upper Extremity Assessment Upper Extremity Assessment: LUE deficits/detail;Defer to OT evaluation (grip equal bil) LUE Deficits / Details: Recent L humerus ORIF    Lower Extremity Assessment Lower Extremity Assessment: LLE deficits/detail;RLE deficits/detail RLE Deficits / Details: ROM WFL; MMT grossly 4+/5 throuhgout LLE Deficits / Details: ROM WFL; MMT grossly 4+/5 throuhgout    Cervical / Trunk Assessment Cervical / Trunk Assessment: Normal  Communication      Cognition Arousal: Alert Behavior During Therapy: WFL for tasks assessed/performed Overall Cognitive Status: No family/caregiver present to determine baseline cognitive functioning                                 General Comments: Pt does have hx of mild cognitive impairment but also admitted with AMS.  She was oriented to year, self, and place.  Stating she is in hospital for her arm surgery (this was 2 weeks ago).  She was able to state president, name months of years backward, and named 2/3 animals starting with C.  Some inconsistencies in providing history.         General Comments  Pt able to sit at EOB with assist.  After being at EOB for a few mins, pt c/o lightheaded/"head heavy."  Checked and her BP was stable 130's/80's.  When pt had episode her head dropped briefly 2-3 x like falling asleep or syncope but then would lift. She then improved and was able to continue.  Did more EOB testing, then pt with 2 more episodes of head heavy, dropping, and now reports hot flashes.  Denies any nausea.  Due to these episodes, returned to supine, did not attempt stand.  BP still 130's/80's supine.  O2 sats 100% ; HR 60's.    No nystagmus noted.  Eye tracking somewhat uncoordinated/slow and horizontal correction with test of skew consistent with age related changes.  Pt denies vision changes or double vision.  Symptoms more of lightheaded so did not pursue further vestibular.     Exercises     Assessment/Plan    PT Assessment Patient needs continued PT services  PT Problem List Decreased strength;Decreased range of motion;Decreased activity tolerance;Decreased balance;Decreased mobility;Decreased knowledge of use of DME;Decreased safety awareness;Decreased knowledge of precautions       PT Treatment Interventions DME instruction;Therapeutic exercise;Gait training;Stair training;Functional mobility training;Therapeutic activities;Patient/family education;Neuromuscular re-education;Balance training    PT Goals (Current goals can be found in the Care Plan section)  Acute Rehab PT Goals Patient Stated Goal: return home PT Goal Formulation: With patient Time For Goal Achievement: 01/25/24 Potential to Achieve Goals: Good    Frequency Min 1X/week     Co-evaluation               AM-PAC PT "6 Clicks" Mobility  Outcome Measure Help needed turning from your back to your side while in a flat bed without using bedrails?: A Little Help needed moving from lying on your back to sitting on the side of a flat bed without using bedrails?: A Lot Help needed moving  to and from a bed to a chair (including a wheelchair)?: Total Help needed standing up from a chair using your arms (e.g., wheelchair or bedside chair)?: Total Help needed to walk in hospital room?: Total Help needed climbing 3-5 steps with a railing? : Total 6 Click Score: 9    End of Session Equipment Utilized During Treatment: Gait belt Activity Tolerance: Treatment limited secondary to medical complications (Comment) Patient left: in bed;with call bell/phone within reach Nurse Communication: Mobility status;Other (comment) (notified MD and RN of lightheadedness) PT Visit Diagnosis: Other abnormalities of gait and mobility (R26.89);Muscle weakness (generalized) (M62.81);Dizziness and giddiness (R42)    Time: 1610-9604 PT Time Calculation (min) (ACUTE ONLY): 31 min   Charges:   PT Evaluation $PT Eval Moderate Complexity: 1 Mod PT Treatments $Therapeutic Activity: 8-22 mins PT General Charges $$ ACUTE PT VISIT: 1 Visit         Anise Salvo, PT Acute Rehab [facility] [phone]   Rayetta Humphrey 01/11/2024, 12:01 PM

## 2024-01-11 NOTE — Plan of Care (Signed)
  Problem: Education: Goal: Knowledge of General Education information will improve Description: Including pain rating scale, medication(s)/side effects and non-pharmacologic comfort measures Outcome: Progressing   Problem: Health Behavior/Discharge Planning: Goal: Ability to manage health-related needs will improve Outcome: Progressing   Problem: Clinical Measurements: Goal: Ability to maintain clinical measurements within normal limits will improve Outcome: Progressing Goal: Will remain free from infection Outcome: Progressing Goal: Diagnostic test results will improve Outcome: Progressing Goal: Respiratory complications will improve Outcome: Progressing   Problem: Nutrition: Goal: Adequate nutrition will be maintained Outcome: Progressing   Problem: Elimination: Goal: Will not experience complications related to bowel motility Outcome: Progressing Goal: Will not experience complications related to urinary retention Outcome: Progressing   Problem: Safety: Goal: Ability to remain free from injury will improve Outcome: Progressing   Problem: Skin Integrity: Goal: Risk for impaired skin integrity will decrease Outcome: Progressing

## 2024-01-11 NOTE — ED Notes (Signed)
Help get patient cleaned up placed a clean sheet placed a clean gown and another brief patient is resting with call bell in reach

## 2024-01-11 NOTE — Plan of Care (Signed)
MRI brain shows no e/o acute infarct. Neurology to sign off, please re-engage if additional questions arise.  Bing Neighbors, MD [facility] [phone]  If 7pm- 7am, please page neurology on call as listed in AMION.

## 2024-01-11 NOTE — ED Notes (Signed)
Husband notified of pt new room number [phone]

## 2024-01-12 DIAGNOSIS — R410 Disorientation, unspecified: Secondary | ICD-10-CM | POA: Diagnosis not present

## 2024-01-12 DIAGNOSIS — I10 Essential (primary) hypertension: Secondary | ICD-10-CM

## 2024-01-12 DIAGNOSIS — F0390 Unspecified dementia without behavioral disturbance: Secondary | ICD-10-CM

## 2024-01-12 DIAGNOSIS — R471 Dysarthria and anarthria: Secondary | ICD-10-CM | POA: Diagnosis not present

## 2024-01-12 LAB — CBC
HCT: 28.3 % — ABNORMAL LOW (ref 36.0–46.0)
Hemoglobin: 9.7 g/dL — ABNORMAL LOW (ref 12.0–15.0)
MCH: 30.4 pg (ref 26.0–34.0)
MCHC: 34.3 g/dL (ref 30.0–36.0)
MCV: 88.7 fL (ref 80.0–100.0)
Platelets: 370 10*3/uL (ref 150–400)
RBC: 3.19 MIL/uL — ABNORMAL LOW (ref 3.87–5.11)
RDW: 14.3 % (ref 11.5–15.5)
WBC: 4.7 10*3/uL (ref 4.0–10.5)
nRBC: 0 % (ref 0.0–0.2)

## 2024-01-12 LAB — BASIC METABOLIC PANEL
Anion gap: 7 (ref 5–15)
BUN: 16 mg/dL (ref 8–23)
CO2: 20 mmol/L — ABNORMAL LOW (ref 22–32)
Calcium: 9.1 mg/dL (ref 8.9–10.3)
Chloride: 109 mmol/L (ref 98–111)
Creatinine, Ser: 0.85 mg/dL (ref 0.44–1.00)
GFR, Estimated: >60 mL/min (ref >60)
Glucose, Bld: 105 mg/dL — ABNORMAL HIGH (ref 70–99)
Potassium: 4.1 mmol/L (ref 3.5–5.1)
Sodium: 136 mmol/L (ref 135–145)

## 2024-01-12 LAB — MAGNESIUM: Magnesium: 2 mg/dL (ref 1.7–2.4)

## 2024-01-12 MED ORDER — CALCIUM 500 MG PO TABS: 1200.0000 mg | ORAL_TABLET | Freq: Every morning | ORAL | Status: DC

## 2024-01-12 MED ORDER — VITAMIN C 500 MG PO TABS
1000.0000 mg | ORAL_TABLET | Freq: Every morning | ORAL | Status: DC
  Administered 2024-01-12 – 2024-01-16 (×5): 1000 mg via ORAL
  Filled 2024-01-12 (×5): qty 2

## 2024-01-12 MED ORDER — POLYVINYL ALCOHOL 1.4 % OP SOLN: 1.0000 [drp] | OPHTHALMIC | Status: DC | PRN

## 2024-01-12 MED ORDER — VITAMIN D 25 MCG (1000 UNIT) PO TABS
5000.0000 [IU] | ORAL_TABLET | Freq: Every morning | ORAL | Status: DC
  Administered 2024-01-12 – 2024-01-16 (×5): 5000 [IU] via ORAL
  Filled 2024-01-12 (×5): qty 5

## 2024-01-12 MED ORDER — IRBESARTAN-HYDROCHLOROTHIAZIDE 150-12.5 MG PO TABS: 1.0000 | ORAL_TABLET | Freq: Every day | ORAL | Status: DC

## 2024-01-12 MED ORDER — CARBOXYMETHYLCELLULOSE SODIUM 0.25 % OP SOLN: 1.0000 [drp] | OPHTHALMIC | Status: DC | PRN

## 2024-01-12 MED ORDER — HYDROCHLOROTHIAZIDE 12.5 MG PO TABS
12.5000 mg | ORAL_TABLET | Freq: Every day | ORAL | Status: DC
  Administered 2024-01-12 – 2024-01-16 (×5): 12.5 mg via ORAL
  Filled 2024-01-12 (×5): qty 1

## 2024-01-12 MED ORDER — MIRTAZAPINE 15 MG PO TABS
45.0000 mg | ORAL_TABLET | Freq: Every day | ORAL | Status: DC
  Administered 2024-01-12 – 2024-01-15 (×4): 45 mg via ORAL
  Filled 2024-01-12 (×5): qty 3

## 2024-01-12 MED ORDER — LORATADINE 10 MG PO TABS
10.0000 mg | ORAL_TABLET | Freq: Every day | ORAL | Status: DC
  Administered 2024-01-12 – 2024-01-16 (×5): 10 mg via ORAL
  Filled 2024-01-12 (×5): qty 1

## 2024-01-12 MED ORDER — CALCIUM CARBONATE 1250 (500 CA) MG PO TABS
1250.0000 mg | ORAL_TABLET | Freq: Every day | ORAL | Status: DC
  Administered 2024-01-13 – 2024-01-16 (×4): 1250 mg via ORAL
  Filled 2024-01-12 (×4): qty 1

## 2024-01-12 MED ORDER — IRBESARTAN 300 MG PO TABS
150.0000 mg | ORAL_TABLET | Freq: Every day | ORAL | Status: DC
  Administered 2024-01-12 – 2024-01-16 (×5): 150 mg via ORAL
  Filled 2024-01-12 (×5): qty 1

## 2024-01-12 MED ORDER — MELATONIN 5 MG PO TABS
10.0000 mg | ORAL_TABLET | Freq: Every day | ORAL | Status: DC
  Administered 2024-01-12 – 2024-01-15 (×4): 10 mg via ORAL
  Filled 2024-01-12 (×4): qty 2

## 2024-01-12 MED ORDER — FENOFIBRATE 160 MG PO TABS
160.0000 mg | ORAL_TABLET | Freq: Every day | ORAL | Status: DC
  Administered 2024-01-12 – 2024-01-16 (×5): 160 mg via ORAL
  Filled 2024-01-12 (×5): qty 1

## 2024-01-12 MED ORDER — MEMANTINE HCL 10 MG PO TABS
5.0000 mg | ORAL_TABLET | Freq: Two times a day (BID) | ORAL | Status: DC
  Administered 2024-01-12 – 2024-01-16 (×9): 5 mg via ORAL
  Filled 2024-01-12 (×10): qty 1

## 2024-01-12 NOTE — Care Management Obs Status (Signed)
MEDICARE OBSERVATION STATUS NOTIFICATION   Patient Details  Name: Melissa Bowman MRN: [id number] Date of Birth: Aug 22, 1951   Medicare Observation Status Notification Given:  Yes    Mearl Latin, LCSW 01/12/2024, 11:26 AM

## 2024-01-12 NOTE — TOC Progression Note (Signed)
Transition of Care [facility]) - Progression Note    Patient Details  Name: Melissa Bowman MRN: [id number] Date of Birth: [birth year]  Transition of Care [facility]) CM/SW Contact  Zanylah Hardie Reeves Forth, Student-Social Work Phone Number: 01/12/2024, 9:36 AM  Clinical Narrative:    MSW Intern spoke with pt husband about SNF placement following discharge. Pt husband stated he would rather take her home with home health. Pt husband is planning to transport pt home himself and stated they already have a walker and cane at home.         Expected Discharge Plan and Services       Living arrangements for the past 2 months: Single Family Home                                       Social Determinants of Health (SDOH) Interventions SDOH Screenings   Food Insecurity: Patient Declined (01/11/2024)  Housing: Patient Declined (01/11/2024)  Transportation Needs: Patient Declined (01/11/2024)  Utilities: Patient Declined (01/11/2024)  Alcohol Screen: Low Risk  (04/25/2023)  Depression (PHQ2-9): Low Risk  (09/12/2023)  Financial Resource Strain: Low Risk  (04/25/2023)  Physical Activity: Insufficiently Active (04/25/2023)  Social Connections: Unknown (01/11/2024)  Stress: No Stress Concern Present (04/25/2023)  Tobacco Use: Low Risk  (01/10/2024)    Readmission Risk Interventions     No data to display

## 2024-01-12 NOTE — NC FL2 (Signed)
[place] MEDICAID FL2 LEVEL OF CARE FORM     IDENTIFICATION  Patient Name: Melissa Bowman Birthdate: September 28, 1951 Sex: female Admission Date (Current Location): 01/10/2024  [facility] and [place] Number:  [employer] and Address:  The [place], [address] [place], [place] [address]      Provider Number: [id number]  Attending Physician Name and Address:  Maretta Bees, MD  Relative Name and Phone Number:       Current Level of Care: Hospital Recommended Level of Care: Skilled Nursing Facility Prior Approval Number:    Date Approved/Denied:   PASRR Number: [id number]  Discharge Plan: SNF    Current Diagnoses: Patient Active Problem List   Diagnosis Date Noted   AMS (altered mental status) 01/10/2024   Loss of weight 08/25/2023   Mild cognitive impairment 12/28/2022   Hearing decreased, bilateral 09/22/2022   Mild cognitive disorder 09/10/2022   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/2022   Insomnia 08/19/2022   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   Sleep apnea 12/24/2021   Heart murmur 12/24/2021   Hyperlipidemia associated with type 2 diabetes mellitus (HCC) 10/30/2019   Osteoarthritis of multiple joints 04/02/2019   Right rotator cuff tear 01/19/2018   Osteopenia after menopause 12/26/2013   Type 2 diabetes mellitus (HCC) 10/31/2013   Hypertension associated with diabetes (HCC) 05/14/2013   Allergic rhinitis 05/14/2013   Vitamin D deficiency 05/14/2013   Hemorrhage of rectum and anus 09/15/2011   Dysphagia, pharyngoesophageal phase 09/15/2011   Chronic RUQ pain 09/15/2011   Diverticulosis of colon 09/15/2011   BRBPR (bright red blood per rectum) 08/27/2011   Internal hemorrhoids 08/27/2011   Esophageal reflux 08/27/2011   Abdominal pain 08/27/2011   Esophageal dysphagia 08/27/2011   Obesity 08/27/2011   Central sleep apnea due to medical condition 08/27/2011   Hematochezia  08/27/2011    Orientation RESPIRATION BLADDER Height & Weight     Self, Situation, Place  Normal Incontinent Weight: 132 lb 4.4 oz (60 kg) Height:  5' (152.4 cm)  BEHAVIORAL SYMPTOMS/MOOD NEUROLOGICAL BOWEL NUTRITION STATUS      Continent Diet (See dc summary)  AMBULATORY STATUS COMMUNICATION OF NEEDS Skin   Limited Assist Verbally Other (Comment) (wound on sacrum)                       Personal Care Assistance Level of Assistance  Bathing, Feeding, Dressing Bathing Assistance: Limited assistance Feeding assistance: Limited assistance Dressing Assistance: Limited assistance     Functional Limitations Info  Sight, Hearing Sight Info: Impaired (glasses) Hearing Info: Impaired      SPECIAL CARE FACTORS FREQUENCY  PT (By licensed PT), OT (By licensed OT)     PT Frequency: 5x/week OT Frequency: 5x/week            Contractures Contractures Info: Not present    Additional Factors Info  Code Status, Allergies Code Status Info: Full Allergies Info: Statins, E-mycin (Erythromycin), Estrogens, Sulfa Antibiotics, Vioxx (Rofecoxib)           Current Medications (01/12/2024):  This is the current hospital active medication list Current Facility-Administered Medications  Medication Dose Route Frequency Provider Last Rate Last Admin   acetaminophen (TYLENOL) tablet 650 mg  650 mg Oral Q6H PRN Dow Adolph N, DO       ascorbic acid (VITAMIN C) tablet 1,000 mg  1,000 mg Oral q AM Ghimire, Werner Lean, MD       Calcium 1,000 mg  1,000 mg Oral q AM Ghimire, Werner Lean, MD       Carboxymethylcellulose Sodium 0.25 % SOLN 1-2 drop  1-2 drop Both Eyes PRN Ghimire, Werner Lean, MD       enoxaparin (LOVENOX) injection 40 mg  40 mg Subcutaneous Q24H Hall, Carole N, DO   40 mg at 01/11/24 2056   fenofibrate tablet 160 mg  160 mg Oral Daily Ghimire, Werner Lean, MD       irbesartan-hydrochlorothiazide (AVALIDE) 150-12.5 MG per tablet 1 tablet  1 tablet Oral Daily Ghimire, Werner Lean, MD        loratadine (CLARITIN) tablet 10 mg  10 mg Oral Daily Ghimire, Werner Lean, MD       melatonin tablet 10 mg  10 mg Oral QHS Ghimire, Werner Lean, MD       memantine [facility]) tablet 5 mg  5 mg Oral BID Ghimire, Werner Lean, MD       mirtazapine (REMERON) tablet 45 mg  45 mg Oral QHS Ghimire, Shanker M, MD       montelukast (SINGULAIR) tablet 10 mg  10 mg Oral Daily Dow Adolph N, DO   10 mg at 01/12/24 0901   pantoprazole (PROTONIX) EC tablet 40 mg  40 mg Oral BID AC Hall, Carole N, DO   40 mg at 01/12/24 0902   polyethylene glycol (MIRALAX / GLYCOLAX) packet 17 g  17 g Oral Daily PRN Dow Adolph N, DO       prochlorperazine (COMPAZINE) injection 5 mg  5 mg Intravenous Q6H PRN Hall, Carole N, DO       sertraline (ZOLOFT) tablet 50 mg  50 mg Oral Daily Hall, Carole N, DO   50 mg at 01/12/24 0901   Vitamin D-3 TABS 5,000 Units  5,000 Units Oral q AM Ghimire, Werner Lean, MD         Discharge Medications: Please see discharge summary for a list of discharge medications.  Relevant Imaging Results:  Relevant Lab Results:   Additional Information SSN: [id number] [place], LCSW

## 2024-01-12 NOTE — TOC Progression Note (Addendum)
Transition of Care [facility]) - Progression Note    Patient Details  Name: Melissa Bowman MRN: [id number] Date of Birth: 13-Dec-1951  Transition of Care [facility]) CM/SW Contact  Mearl Latin, LCSW Phone Number: 01/12/2024, 11:15 AM  Clinical Narrative:    11:15am-Family at bedside now requesting SNF placement. Daughter with preference for 1-[facility], 2-[facility]. CSW discussed insurance authorization process and will provide Medicare SNF ratings list. CSW will send out referrals for review and provide bed offers as available. [facility] reviewing referral. Family stated they will transport via car.   12:15 PM-[facility] denied. CSW left voicemail for [facility] has a bed Saturday. CSW requested [facility] review as well.   1:52 PM-[facility] able to accept patient once authorization is approved. CSW submitted to portal, Certification #[id number]. Daughter updated.   4:46 PM-Insurance authorization still pending.   Skilled Nursing Rehab Facilities-   [email or web address] Ratings out of 5 stars (the highest)   Name Address  Phone # Quality Care Staffing Health Inspection Overall  [facility] [address] [phone] 2 2 5 5   [facility] [address] [place] [phone] 4 2 4 4   [facility] [address], [facility] [phone] 2 1 2 1   [facility] [address] [place] [phone] 3 1 4 3   [facility]  [address], [place] [phone] 4 4 5 5   [facility] [address] [facility] [phone] 3 2 2 2   [facility] [address] [place] [phone] 5 1 2 2   [facility] [phone] [address] [place] [phone] 1 1 3 1   [address] ([place]) [address] [place] [phone] 2 2 2 2   [facility] [address] [place], [place] [phone] 2 2 1 1   [facility] ([place]) [address], [place]  [phone] 3 1 1 1   [facility] [address] Liliane Shi [phone] 3 3 4 4   [facility] [address] [place] [phone] 3 4 3 3           [facility] [address] [place] [phone] 4 2 1 1   [facility] [address], [place] [phone] 2 2 2 2   [facility] [address] [place] Ditto [phone] 2 1 4 3   [address], [place] [address], [place] [phone] 1 1 2 1   [facility] [address], [employer] [phone] 2 1 4 3           [place] (no [facility]) [address], [place] [phone] 4 4 5 5   [facility] (No [employer]) [address] [place] [phone] 1 2 4 3   [facility] (No [employer]) [address], [place] [phone] 4 1 5 4   [facility] [address], [employer] [phone] 3 4 2 2   [facility] [address], [place] [phone] 2 1 2 1   [facility] [address] [place] [phone] 2 1 1 1   [place] [address] Liliane Shi [phone] 4 2 5 5   [facility]  [address] [place] [phone] 2 2 3 3   [facility] [address], [place] [phone] 4 1 1 1   [facility] [address] [place], [place] [phone] 2 2 2 2           [facility] [address] [place] [phone] 2 1 1 1   [facility] [address] Evlyn Clines  [phone] 3 3 3  3  [facility] [address], [place]an [phone] 4 3 5 5   [facility] [place] [address] [place] [phone] 2 1 1 1   [facility] (No Humana) [address] [place] [phone] 2 2 4 4   [facility] [address], [place] [phone] 2 1 2 1           [facility] [address] [place], [place] [phone] 5 4 5 5   [facility])  [address] [place] [phone] 1 1 2 1   [facility] [address] [place] [phone]  2 4 4   [facility] [address], [place] [phone] 3 5 5 5   [address] [place], [place] [phone] 4 2 2 2   [facility]) [address] [place] [phone] 2 1 3 2       Expected Discharge Plan: Home w Home Health Services Barriers to Discharge: Continued Medical Work up  Expected Discharge Plan and Services In-house Referral: Clinical Social Work Discharge Planning Services: CM Consult Post Acute Care Choice: Home Health Living arrangements for the past 2 months: Single Family Home                                       Social Determinants of Health (SDOH) Interventions SDOH Screenings   Food Insecurity: Patient Declined (01/11/2024)  Housing: Patient Declined (01/11/2024)  Transportation Needs: Patient Declined (01/11/2024)  Utilities: Patient Declined (01/11/2024)  Alcohol Screen: Low Risk  (04/25/2023)  Depression (PHQ2-9): Low Risk  (09/12/2023)  Financial Resource Strain: Low Risk  (04/25/2023)  Physical Activity: Insufficiently Active (04/25/2023)  Social Connections: Unknown (01/11/2024)  Stress: No Stress Concern Present (04/25/2023)  Tobacco Use: Low Risk  (01/10/2024)    Readmission Risk Interventions     No data to display

## 2024-01-12 NOTE — Evaluation (Signed)
Occupational Therapy Evaluation Patient Details Name: Melissa Bowman MRN: [id number] DOB: 12-Dec-1951 Today's Date: 01/12/2024   History of Present Illness Pt is 73 yo female admitted on 01/10/24 with AMS and difficulty speaking.  Brain MRI negative for CVA.  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  She had recent ORIF L humerus on 12/26/23, per op note sling for comfort, can use arm for ADL, no lifting over 2 pounds, did not mention weighbearing status. She had hx including HTN, HLD, DM, osteopenia, mild cognitive impairment.   Clinical Impression   Pt admitted for above, needs mod cues to maintain LUE NWB precautions with activity. Pt demonstrating improved cognition from PT session. Pt able to ambulate with light min A to CGA HHA for stability, needs some light assist to stand. OT to continue to follow pt acutely to address listed deficits and help transition to next level of care. Pt already discussed disposition to SNF with an MD.        If plan is discharge home, recommend the following: A little help with walking and/or transfers;Assistance with cooking/housework;Assist for transportation    Functional Status Assessment  Patient has had a recent decline in their functional status and demonstrates the ability to make significant improvements in function in a reasonable and predictable amount of time.  Equipment Recommendations  Other (comment) (defer)    Recommendations for Other Services       Precautions / Restrictions Precautions Precautions: Fall;Other (comment) Precaution Comments: Pt with recent L humerus ORIF 12/26/23, per op note:sling for comfort, can use arm for ADL, no lifting over 2 pounds, did not mention weighbearing status will treat NWB Restrictions Weight Bearing Restrictions Per Provider Order: Yes LUE Weight Bearing Per Provider Order: Non weight bearing      Mobility Bed Mobility Overal bed mobility: Needs Assistance Bed  Mobility: Supine to Sit, Sit to Supine     Supine to sit: Min assist Sit to supine: Min assist   General bed mobility comments: min A to raise trunk, min A for legs back to bed. Mod cues needed to maintain LUE NWB precautions    Transfers Overall transfer level: Needs assistance Equipment used: None Transfers: Sit to/from Stand Sit to Stand: +2 safety/equipment, Contact guard assist           General transfer comment: HHA      Balance Overall balance assessment: Needs assistance Sitting-balance support: No upper extremity supported Sitting balance-Leahy Scale: Fair     Standing balance support: Single extremity supported, During functional activity Standing balance-Leahy Scale: Fair Standing balance comment: Min to CGA                           ADL either performed or assessed with clinical judgement   ADL Overall ADL's : Needs assistance/impaired Eating/Feeding: Independent;Sitting   Grooming: Standing;Oral care;Contact guard assist Grooming Details (indicate cue type and reason): CGA for balance Upper Body Bathing: Set up;Sitting   Lower Body Bathing: Sitting/lateral leans;Set up   Upper Body Dressing : Sitting;Set up   Lower Body Dressing: Sit to/from stand;Minimal assistance Lower Body Dressing Details (indicate cue type and reason): briefs Toilet Transfer: Minimal assistance;Regular Teacher, adult education Details (indicate cue type and reason): min A STS from toilet Toileting- Clothing Manipulation and Hygiene: Minimal assistance Toileting - Clothing Manipulation Details (indicate cue type and reason): briefs     Functional mobility during ADLs: Minimal assistance;Contact guard assist General ADL Comments: Min  A to CGA HHA for balance,     Vision         Perception         Praxis         Pertinent Vitals/Pain Pain Assessment Pain Assessment: No/denies pain     Extremity/Trunk Assessment Upper Extremity Assessment Upper  Extremity Assessment: RUE deficits/detail;LUE deficits/detail RUE Deficits / Details: Shoulder AROM of 100* flexion, strength WFL LUE Deficits / Details: Recent L humerus ORIF. Shoulder AROM of 80* flexion, deferred MMT due to recent ORIF           Communication Communication Communication: No apparent difficulties   Cognition Arousal: Alert Behavior During Therapy: WFL for tasks assessed/performed Overall Cognitive Status: Difficult to assess                                 General Comments: Pt HOH, hearing aides died in session. A&Ox3, reoriented to reason for admission     General Comments       Exercises     Shoulder Instructions      Home Living Family/patient expects to be discharged to:: Private residence Living Arrangements: Spouse/significant other Available Help at Discharge: Family;Available 24 hours/day Type of Home: House Home Access: Stairs to enter     Home Layout: Multi-level Alternate Level Stairs-Number of Steps: Pt lives in split level home reports 2 sets of 6-7 steps if enter from basement or 4-5 if uses back door.  Does have rail, not sure what side.  States once she gets upstairs to [employer] - has been staying there in recliner with bathroom on that level since her surgery.   Bathroom Shower/Tub: Dietitian: Cane - single point;Shower seat;Grab bars - tub/shower          Prior Functioning/Environment Prior Level of Function : Needs assist;Patient poor historian/Family not available             Mobility Comments: Pt reports prior to surgery independent with cane.  States now her husband assist in the morning but by afternoon ambulates on her own with cane.  She is questionable historian - initially reporting levels of assist stated , but then stating "Have I been home since my surgery?" ADLs Comments: Pt reports was independent with cooking, cleaning, and adls prior to L UE ORIF.  Since that time spouse  assisting with all adls.        OT Problem List: Impaired balance (sitting and/or standing);Decreased cognition;Decreased knowledge of precautions;Impaired UE functional use      OT Treatment/Interventions: Self-care/ADL training;DME and/or AE instruction;Therapeutic activities;Balance training;Patient/family education;Therapeutic exercise    OT Goals(Current goals can be found in the care plan section) Acute Rehab OT Goals Patient Stated Goal: Going to rehab OT Goal Formulation: With patient/family Time For Goal Achievement: 01/26/24 Potential to Achieve Goals: Good ADL Goals Pt Will Perform Grooming: standing;with supervision Pt Will Perform Upper Body Bathing: with modified independence;sitting Pt Will Perform Lower Body Dressing: sit to/from stand;with set-up Pt Will Transfer to Toilet: with supervision;ambulating Additional ADL Goal #3: Pt will maintain LUE NWB precautions 100% of the time with functional activity and transfers without cueing  OT Frequency: Min 1X/week    Co-evaluation              AM-PAC OT "6 Clicks" Daily Activity     Outcome Measure Help from another person eating meals?: None Help  from another person taking care of personal grooming?: A Little Help from another person toileting, which includes using toliet, bedpan, or urinal?: A Little Help from another person bathing (including washing, rinsing, drying)?: A Little Help from another person to put on and taking off regular upper body clothing?: A Little Help from another person to put on and taking off regular lower body clothing?: A Little 6 Click Score: 19   End of Session Equipment Utilized During Treatment: Gait belt Nurse Communication: Mobility status  Activity Tolerance: Patient tolerated treatment well Patient left: in bed;with call bell/phone within reach;with bed alarm set;with family/visitor present  OT Visit Diagnosis: Other symptoms and signs involving cognitive  function;Unsteadiness on feet (R26.81)                Time: 1610-9604 OT Time Calculation (min): 20 min Charges:  OT General Charges $OT Visit: 1 Visit OT Evaluation $OT Eval Low Complexity: 1 Low  01/12/2024  AB, OTR/L  Acute Rehabilitation Services  Office: [phone]   Tristan Schroeder 01/12/2024, 1:05 PM

## 2024-01-12 NOTE — Progress Notes (Signed)
PROGRESS NOTE        PATIENT DETAILS Name: Melissa Bowman Age: 73 y.o. Sex: female Date of Birth: [birth year] Admit Date: 01/10/2024 Admitting Physician Darlin Drop, DO QMV:[id number], Rozell Searing, DO  Brief Summary: Patient is a 73 y.o.  female with history of HTN, HLD, diet-controlled DM-2-who presented with dysarthria-initially there was concern for CVA-however MRI brain was negative-in she is now thought to have dysarthria related to polypharmacy (narcotics/muscle relaxants started recently after ORIF for left humerus fracture)  Significant events: 1/13>> ORIF for left humerus fracture 1/28>> admit to [facility]  Significant studies: 1/28>> MRI brain: No acute CVA  Significant microbiology data: None  Procedures: None  Consults: Neurology  Subjective: Lying comfortably in bed-denies any chest pain or shortness of breath.  Speech clear-very hard of hearing.  Objective: Vitals: Blood pressure (!) 152/71, pulse (!) 57, temperature 98.6 F (37 C), temperature source Oral, resp. rate 16, height 5' (1.524 m), weight 60 kg, SpO2 100%.   Exam: Gen Exam:Alert awake-not in any distress HEENT:atraumatic, normocephalic Chest: B/L clear to auscultation anteriorly CVS:S1S2 regular Abdomen:soft non tender, non distended Extremities:no edema Neurology: Non focal Skin: no rash  Pertinent Labs/Radiology:    Latest Ref Rng & Units 01/12/2024    4:50 AM 01/11/2024   11:14 AM 01/11/2024    5:48 AM  CBC  WBC 4.0 - 10.5 K/uL 4.7   5.1   Hemoglobin 12.0 - 15.0 g/dL 9.7  9.9  9.4   Hematocrit 36.0 - 46.0 % 28.3  29.0  27.7   Platelets 150 - 400 K/uL 370   405     Lab Results  Component Value Date   NA 136 01/12/2024   K 4.1 01/12/2024   CL 109 01/12/2024   CO2 20 (L) 01/12/2024      Assessment/Plan: Dysarthria Acute toxic encephalopathy Secondary to narcotics/muscle relaxants use (recent ORIF of left humerus) MRI brain negative for CVA-no further  workup recommended by neurology Thankfully dysarthria is completely resolved-her mentation is back to baseline  Recent humerus fracture-s/p ORIF 1/13 Stable Minimize narcotics/muscle relaxant use  HTN BP on the higher side Resume irbesartan/HCTZ and follow  HLD Fenofibrate  GERD PPI  Normocytic anemia At baseline Follow CBC periodically Further workup deferred to outpatient setting  Dementia/mood disorder Stable Resume Zoloft/Remeron/Namenda  Recent fall resulting in left numerous fracture-s/p ORIF 1/13 Debility/deconditioning PT/OT eval-SNF recommended.  BMI: Estimated body mass index is 25.83 kg/m as calculated from the following:   Height as of this encounter: 5' (1.524 m).   Weight as of this encounter: 60 kg.   Code status:   Code Status: Full Code   DVT Prophylaxis: enoxaparin (LOVENOX) injection 40 mg Start: 01/10/24 2130   Family Communication: Spouse/daughter at bedside   Disposition Plan: Status is: Observation The patient remains OBS appropriate and will d/c before 2 midnights.   Planned Discharge Destination:Skilled nursing facility   Diet: Diet Order             Diet regular Room service appropriate? Yes; Fluid consistency: Thin  Diet effective now                     Antimicrobial agents: Anti-infectives (From admission, onward)    None        MEDICATIONS: Scheduled Meds:  enoxaparin (LOVENOX) injection  40 mg Subcutaneous Q24H  montelukast  10 mg Oral Daily   pantoprazole  40 mg Oral BID AC   sertraline  50 mg Oral Daily   Continuous Infusions: PRN Meds:.acetaminophen, melatonin, polyethylene glycol, prochlorperazine   I have personally reviewed following labs and imaging studies  LABORATORY DATA: CBC: Recent Labs  Lab 01/10/24 1738 01/10/24 1741 01/10/24 2322 01/11/24 0548 01/11/24 1114 01/12/24 0450  WBC 6.0  --  5.7 5.1  --  4.7  NEUTROABS 2.6  --   --   --   --   --   HGB 10.2* 10.2* 9.3* 9.4* 9.9*  9.7*  HCT 30.5* 30.0* 27.6* 27.7* 29.0* 28.3*  MCV 89.2  --  88.7 88.5  --  88.7  PLT 437*  --  394 405*  --  370    Basic Metabolic Panel: Recent Labs  Lab 01/10/24 1738 01/10/24 1741 01/10/24 2322 01/11/24 0548 01/11/24 1114 01/12/24 0450  NA 140 141  --  136 139 136  K 3.3* 3.3*  --  3.1* 4.2 4.1  CL 105 106  --  104  --  109  CO2 22  --   --  19*  --  20*  GLUCOSE 100* 94  --  126*  --  105*  BUN 15 15  --  14  --  16  CREATININE 0.97 1.00 0.98 1.25*  --  0.85  CALCIUM 9.9  --   --  9.1  --  9.1  MG  --   --   --  1.9  --  2.0  PHOS  --   --   --  3.3  --   --     GFR: Estimated Creatinine Clearance: 47.7 mL/min (by C-G formula based on SCr of 0.85 mg/dL).  Liver Function Tests: Recent Labs  Lab 01/10/24 1738  AST 26  ALT 17  ALKPHOS 87  BILITOT 0.6  PROT 6.4*  ALBUMIN 3.7   No results for input(s): "LIPASE", "AMYLASE" in the last 168 hours. Recent Labs  Lab 01/11/24 1101  AMMONIA 37*    Coagulation Profile: Recent Labs  Lab 01/10/24 1738  INR 1.0    Cardiac Enzymes: No results for input(s): "CKTOTAL", "CKMB", "CKMBINDEX", "TROPONINI" in the last 168 hours.  BNP (last 3 results) No results for input(s): "PROBNP" in the last 8760 hours.  Lipid Profile: No results for input(s): "CHOL", "HDL", "LDLCALC", "TRIG", "CHOLHDL", "LDLDIRECT" in the last 72 hours.  Thyroid Function Tests: Recent Labs    01/11/24 1101  TSH 2.618    Anemia Panel: No results for input(s): "VITAMINB12", "FOLATE", "FERRITIN", "TIBC", "IRON", "RETICCTPCT" in the last 72 hours.  Urine analysis:    Component Value Date/Time   COLORURINE STRAW (A) 01/10/2024 1942   APPEARANCEUR CLEAR 01/10/2024 1942   APPEARANCEUR Hazy (A) 03/07/2019 1500   LABSPEC 1.008 01/10/2024 1942   PHURINE 8.0 01/10/2024 1942   GLUCOSEU NEGATIVE 01/10/2024 1942   HGBUR NEGATIVE 01/10/2024 1942   BILIRUBINUR NEGATIVE 01/10/2024 1942   BILIRUBINUR Negative 03/07/2019 1500   KETONESUR NEGATIVE  01/10/2024 1942   PROTEINUR NEGATIVE 01/10/2024 1942   UROBILINOGEN negative 09/16/2015 1016   NITRITE NEGATIVE 01/10/2024 1942   LEUKOCYTESUR NEGATIVE 01/10/2024 1942    Sepsis Labs: Lactic Acid, Venous    Component Value Date/Time   LATICACIDVEN 1.9 01/10/2024 1741    MICROBIOLOGY: No results found for this or any previous visit (from the past 240 hours).  RADIOLOGY STUDIES/RESULTS: MR BRAIN WO CONTRAST Result Date: 01/10/2024 CLINICAL DATA:  Stroke,  follow-up EXAM: MRI HEAD WITHOUT CONTRAST TECHNIQUE: Multiplanar, multiecho pulse sequences of the brain and surrounding structures were obtained without intravenous contrast. COMPARISON:  11/30/2020 MRI head, 01/10/2024 CT head FINDINGS: Evaluation is limited by motion artifact. Brain: No restricted diffusion to suggest acute or subacute infarct. No acute hemorrhage, mass, mass effect, or midline shift. No hydrocephalus or extra-axial collection. Pituitary and craniocervical junction within normal limits. No hemosiderin deposition to suggest remote hemorrhage. 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 Vascular: Poor visualization of the right vertebral artery flow void, which may be related to motion. Otherwise normal arterial flow voids. Skull and upper cervical spine: Normal marrow signal. Sinuses/Orbits: No acute finding. IMPRESSION: Evaluation is limited by motion artifact. Within this limitation, no acute intracranial process. No evidence of acute or subacute infarct. Electronically Signed   By: Wiliam Ke M.D.   On: 01/10/2024 20:21   DG Chest Portable 1 View Result Date: 01/10/2024 CLINICAL DATA:  AMS EXAM: PORTABLE CHEST - 1 VIEW COMPARISON:  CT 12/20/2023 FINDINGS: Lungs are clear. Heart size and mediastinal contours are within normal limits. Aortic Atherosclerosis (ICD10-170.0). No effusion. Left humeral fixation hardware partially visualized. IMPRESSION: No acute cardiopulmonary disease. Electronically Signed   By: Corlis Leak  M.D.   On: 01/10/2024 17:29   CT HEAD CODE STROKE WO CONTRAST Result Date: 01/10/2024 CLINICAL DATA:  Code stroke. EXAM: CT HEAD WITHOUT CONTRAST TECHNIQUE: Contiguous axial images were obtained from the base of the skull through the vertex without intravenous contrast. RADIATION DOSE REDUCTION: This exam was performed according to the departmental dose-optimization program which includes automated exposure control, adjustment of the mA and/or kV according to patient size and/or use of iterative reconstruction technique. COMPARISON:  No prior CT head available, correlation is made with MRI head 11/30/2020 FINDINGS: Brain: No evidence of acute infarction, hemorrhage, mass, mass effect, or midline shift. No hydrocephalus or extra-axial collection. 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 Vascular: No hyperdense vessel. Atherosclerotic calcifications in the intracranial carotid and vertebral arteries. Skull: Negative for fracture or focal lesion. Sinuses/Orbits: No acute finding. Other: The mastoid air cells are well aerated. ASPECTS [facility] Stroke Program Early CT Score) - Ganglionic level infarction (caudate, lentiform nuclei, internal capsule, insula, M1-M3 cortex): 7 - Supraganglionic infarction (M4-M6 cortex): 3 Total score (0-10 with 10 being normal): 10 IMPRESSION: No acute intracranial process.  ASPECTS is 10. Imaging results were communicated on 01/10/2024 at 5:10 pm to provider STACK via secure text paging. Electronically Signed   By: Wiliam Ke M.D.   On: 01/10/2024 17:10     LOS: 0 days   Jeoffrey Massed, MD  [facility]    To contact the attending provider between 7A-7P or the covering provider during after hours 7P-7A, please log into the web site [email or web address] and access using universal [place] password for that web site. If you do not have the password, please call the hospital operator.  01/12/2024, 11:06 AM

## 2024-01-12 NOTE — Progress Notes (Signed)
Mobility Specialist Progress Note:    01/12/24 1604  Mobility  Activity Ambulated with assistance in hallway  Level of Assistance Minimal assist, patient does 75% or more  Assistive Device Front wheel walker  Distance Ambulated (ft) 150 ft  LUE Weight Bearing Per Provider Order NWB  Activity Response Tolerated well  Mobility Referral Yes  Mobility visit 1 Mobility  Mobility Specialist Start Time (ACUTE ONLY) 1510  Mobility Specialist Stop Time (ACUTE ONLY) 1523  Mobility Specialist Time Calculation (min) (ACUTE ONLY) 13 min   Received pt in bed having no complaints and agreeable to mobility. Pt was asymptomatic throughout ambulation. Was able to practice NWB on LUE w/o fault. Returned to room w/o fault. Left in bed w/ call bell in reach and all needs met.   Thompson Grayer Mobility Specialist  Please contact vis Secure Chat or  Rehab Office [phone]

## 2024-01-13 DIAGNOSIS — R471 Dysarthria and anarthria: Secondary | ICD-10-CM | POA: Diagnosis not present

## 2024-01-13 DIAGNOSIS — I10 Essential (primary) hypertension: Secondary | ICD-10-CM | POA: Diagnosis not present

## 2024-01-13 DIAGNOSIS — F0390 Unspecified dementia without behavioral disturbance: Secondary | ICD-10-CM | POA: Diagnosis not present

## 2024-01-13 DIAGNOSIS — R410 Disorientation, unspecified: Secondary | ICD-10-CM | POA: Diagnosis not present

## 2024-01-13 MED ORDER — MELATONIN 10 MG PO TABS: 10.0000 mg | ORAL_TABLET | Freq: Every day | ORAL | Status: AC

## 2024-01-13 NOTE — Progress Notes (Signed)
Patient is alert, awake, pleasant. PT stated she has OSA , but refused to get CPAP when the nurse mentioned the respiratory with help with CPAP.

## 2024-01-13 NOTE — Progress Notes (Signed)
Physical Therapy Treatment Patient Details Name: Melissa Bowman MRN: [id number] DOB: Dec 08, 1951 Today's Date: 01/13/2024   History of Present Illness Pt is 73 yo female admitted on 01/10/24 with AMS and difficulty speaking.  Brain MRI negative for CVA.  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  She had recent ORIF L humerus on 12/26/23, per op note sling for comfort, can use arm for ADL, no lifting over 2 pounds, did not mention weighbearing status. She had hx including HTN, HLD, DM, osteopenia, mild cognitive impairment.    PT Comments  Pt received in supine and agreeable to session. Pt requires orientation to situation and redirection to tasks intermittently during session. Pt able to tolerate increased gait distance with HHA and up to min A for stability. Pt continues to benefit from PT services to progress toward functional mobility goals.    If plan is discharge home, recommend the following: A lot of help with walking and/or transfers;A lot of help with bathing/dressing/bathroom   Can travel by private vehicle     No  Equipment Recommendations  Wheelchair cushion (measurements PT);Wheelchair (measurements PT)    Recommendations for Other Services       Precautions / Restrictions Precautions Precautions: Fall;Other (comment) Precaution Comments: Pt with recent L humerus ORIF 12/26/23, per op note:sling for comfort, can use arm for ADL, no lifting over 2 pounds, did not mention weighbearing status will treat NWB Restrictions Weight Bearing Restrictions Per Provider Order: Yes LUE Weight Bearing Per Provider Order: Non weight bearing     Mobility  Bed Mobility Overal bed mobility: Needs Assistance Bed Mobility: Supine to Sit     Supine to sit: Contact guard, HOB elevated, Used rails     General bed mobility comments: increased time and cues for LUE NWB    Transfers Overall transfer level: Needs assistance Equipment used: None Transfers:  Sit to/from Stand Sit to Stand: Contact guard assist           General transfer comment: from EOB with HHA once standing for stability    Ambulation/Gait Ambulation/Gait assistance: Min assist Gait Distance (Feet): 150 Feet Assistive device: 1 person hand held assist Gait Pattern/deviations: Step-through pattern, Drifts right/left, Decreased stride length       General Gait Details: Pt demonstrates unsteady gait requiring UE support, but no LOB. Tendency for trunk rotation towards the R due to R HHA.       Balance Overall balance assessment: Needs assistance Sitting-balance support: No upper extremity supported Sitting balance-Leahy Scale: Fair Sitting balance - Comments: sitting EOB   Standing balance support: Single extremity supported, During functional activity, Reliant on assistive device for balance Standing balance-Leahy Scale: Poor Standing balance comment: requires UE support for stability                            Cognition Arousal: Alert Behavior During Therapy: WFL for tasks assessed/performed Overall Cognitive Status: Difficult to assess                                 General Comments: Pt asking for date of ORIF, length and reason for admission        Exercises      General Comments        Pertinent Vitals/Pain Pain Assessment Pain Assessment: No/denies pain     PT Goals (current goals can now be found in the  care plan section) Acute Rehab PT Goals Patient Stated Goal: return home PT Goal Formulation: With patient Time For Goal Achievement: 01/25/24 Progress towards PT goals: Progressing toward goals    Frequency    Min 1X/week       AM-PAC PT "6 Clicks" Mobility   Outcome Measure  Help needed turning from your back to your side while in a flat bed without using bedrails?: A Little Help needed moving from lying on your back to sitting on the side of a flat bed without using bedrails?: A Little Help  needed moving to and from a bed to a chair (including a wheelchair)?: A Little Help needed standing up from a chair using your arms (e.g., wheelchair or bedside chair)?: A Little Help needed to walk in hospital room?: A Little Help needed climbing 3-5 steps with a railing? : A Lot 6 Click Score: 17    End of Session Equipment Utilized During Treatment: Gait belt Activity Tolerance: Patient tolerated treatment well Patient left: in chair;with chair alarm set;with call bell/phone within reach Nurse Communication: Mobility status PT Visit Diagnosis: Other abnormalities of gait and mobility (R26.89);Muscle weakness (generalized) (M62.81);Dizziness and giddiness (R42)     Time: 8295-6213 PT Time Calculation (min) (ACUTE ONLY): 24 min  Charges:    $Gait Training: 8-22 mins $Therapeutic Activity: 8-22 mins PT General Charges $$ ACUTE PT VISIT: 1 Visit                     Johny Shock, PTA Acute Rehabilitation Services Secure Chat Preferred  Office:([phone]    Johny Shock 01/13/2024, 11:28 AM

## 2024-01-13 NOTE — TOC Progression Note (Addendum)
Transition of Care [facility]) - Progression Note    Patient Details  Name: Melissa Bowman MRN: [id number] Date of Birth: November 02, 1951  Transition of Care [facility] CM/SW Contact  Dellie Burns [place], [place] Phone Number: 01/13/2024, 9:22 AM  Clinical Narrative:   Drucie Opitz remains pending at this time for [facility]. Confirmed with Destiny at [facility] they have a bed pending auth. SW will provide updates as available.   UPDATE 1350: Drucie Opitz remains pending at this time. Per Destiny with [facility], they are not able to accommodate weekend admission even if auth received over weekend. Will provide updates as available.   Dellie Burns, MSW, LCSW [phone] (coverage)      Expected Discharge Plan: Home w Home Health Services Barriers to Discharge: Continued Medical Work up  Expected Discharge Plan and Services In-house Referral: Clinical Social Work Discharge Planning Services: CM Consult Post Acute Care Choice: Home Health Living arrangements for the past 2 months: Single Family Home Expected Discharge Date: 01/13/24                                     Social Determinants of Health (SDOH) Interventions SDOH Screenings   Food Insecurity: Patient Declined (01/11/2024)  Housing: Patient Declined (01/11/2024)  Transportation Needs: Patient Declined (01/11/2024)  Utilities: Patient Declined (01/11/2024)  Alcohol Screen: Low Risk  (04/25/2023)  Depression (PHQ2-9): Low Risk  (09/12/2023)  Financial Resource Strain: Low Risk  (04/25/2023)  Physical Activity: Insufficiently Active (04/25/2023)  Social Connections: Unknown (01/11/2024)  Stress: No Stress Concern Present (04/25/2023)  Tobacco Use: Low Risk  (01/10/2024)    Readmission Risk Interventions     No data to display

## 2024-01-13 NOTE — Discharge Summary (Signed)
PATIENT DETAILS Name: Melissa Bowman Age: 73 y.o. Sex: female Date of Birth: August 04, 1951 MRN: [id number]. Admitting Physician: Darlin Drop, DO WUJ:[id number], Rozell Searing, DO  Admit Date: 01/10/2024 Discharge date: 01/13/2024  Recommendations for Outpatient Follow-up:  Follow up with PCP in 1-2 weeks Please obtain CMP/CBC in one week  Admitted From:  Home  Disposition: Home   Discharge Condition: good  CODE STATUS:   Code Status: Full Code   Diet recommendation:  Diet Order             Diet - low sodium heart healthy           Diet regular Room service appropriate? Yes; Fluid consistency: Thin  Diet effective now                    Brief Summary: Patient is a 73 y.o.  female with history of HTN, HLD, diet-controlled DM-2-who presented with dysarthria-initially there was concern for CVA-however MRI brain was negative-in she is now thought to have dysarthria related to polypharmacy (narcotics/muscle relaxants started recently after ORIF for left humerus fracture)   Significant events: 1/13>> ORIF for left humerus fracture 1/28>> admit to [facility]   Significant studies: 1/28>> MRI brain: No acute CVA   Significant microbiology data: None   Procedures: None   Consults: Neurology  Brief Hospital Course: Dysarthria Acute toxic encephalopathy Secondary to narcotics/muscle relaxants use (recent ORIF of left humerus) MRI brain negative for CVA-no further workup recommended by neurology Thankfully dysarthria is completely resolved-her mentation is back to baseline   Recent humerus fracture-s/p ORIF 1/13 Stable Minimize narcotics/muscle relaxant use   HTN BP stable on irbesartan/hydrochlorothiazide Follow/optimize.   HLD Fenofibrate   GERD PPI   Normocytic anemia At baseline history of significant chronic anemia Further workup deferred to outpatient setting   Dementia/mood disorder Stable Continue Zoloft/Remeron/Namenda   Recent fall resulting  in left numerous fracture-s/p ORIF 1/13 Debility/deconditioning PT/OT eval-SNF recommended.   BMI: Estimated body mass index is 25.83 kg/m as calculated from the following:   Height as of this encounter: 5' (1.524 m).   Weight as of this encounter: 60 kg.   Discharge Diagnoses:  Principal Problem:   AMS (altered mental status)   Discharge Instructions:  Activity:  As tolerated with Full fall precautions use walker/cane & assistance as needed   Discharge Instructions     Diet - low sodium heart healthy   Complete by: As directed    Discharge instructions   Complete by: As directed    Follow with Primary MD  [name], DO in 1-2 weeks  Please get a complete blood count and chemistry panel checked by your Primary MD at your next visit, and again as instructed by your Primary MD.  Get Medicines reviewed and adjusted: Please take all your medications with you for your next visit with your Primary MD  Laboratory/radiological data: Please request your Primary MD to go over all hospital tests and procedure/radiological results at the follow up, please ask your Primary MD to get all Hospital records sent to his/her office.  In some cases, they will be blood work, cultures and biopsy results pending at the time of your discharge. Please request that your primary care M.D. follows up on these results.  Also Note the following: If you experience worsening of your admission symptoms, develop shortness of breath, life threatening emergency, suicidal or homicidal thoughts you must seek medical attention immediately by calling 911 or calling your MD immediately  if symptoms less severe.  You must read complete instructions/literature along with all the possible adverse reactions/side effects for all the Medicines you take and that have been prescribed to you. Take any new Medicines after you have completely understood and accpet all the possible adverse reactions/side effects.    Do not drive when taking Pain medications or sleeping medications (Benzodaizepines)  Do not take more than prescribed Pain, Sleep and Anxiety Medications. It is not advisable to combine anxiety,sleep and pain medications without talking with your primary care practitioner  Special Instructions: If you have smoked or chewed Tobacco  in the last 2 yrs please stop smoking, stop any regular Alcohol  and or any Recreational drug use.  Wear Seat belts while driving.  Please note: You were cared for by a hospitalist during your hospital stay. Once you are discharged, your primary care physician will handle any further medical issues. Please note that NO REFILLS for any discharge medications will be authorized once you are discharged, as it is imperative that you return to your primary care physician (or establish a relationship with a primary care physician if you do not have one) for your post hospital discharge needs so that they can reassess your need for medications and monitor your lab values.   Increase activity slowly   Complete by: As directed    No dressing needed   Complete by: As directed       Allergies as of 01/13/2024       Reactions   Statins Other (See Comments)   myalgias   E-mycin [erythromycin] Other (See Comments)   Medication intolerance stomach cramps   Estrogens Other (See Comments)   Estrogen replacement Therapy intolerance mood changes   Sulfa Antibiotics Other (See Comments)   Causes severe stomach cramps    Vioxx [rofecoxib] Other (See Comments)   Unknown reaction/interaction. Possible stomach cramps        Medication List     STOP taking these medications    omeprazole 40 MG capsule Commonly known as: PRILOSEC   oxyCODONE-acetaminophen 5-325 MG tablet Commonly known as: PERCOCET/ROXICET       TAKE these medications    CALCIUM PO Take 1,200 mg by mouth in the morning.   Carboxymethylcellulose Sodium 0.25 % Soln Place 1-2 drops into both  eyes as needed (dry eyes).   desloratadine 5 MG tablet Commonly known as: CLARINEX Take 5 mg by mouth in the morning.   famotidine 20 MG tablet Commonly known as: PEPCID Take 1 tablet (20 mg total) by mouth at bedtime. What changed: when to take this   fenofibrate 160 MG tablet TAKE 1 TABLET BY MOUTH EVERY DAY   glycerin adult 2 g suppository Place 1 suppository rectally as needed for constipation.   ibuprofen 200 MG tablet Commonly known as: ADVIL Take 200-400 mg by mouth every 8 (eight) hours as needed (pain.).   irbesartan-hydrochlorothiazide 150-12.5 MG tablet Commonly known as: AVALIDE TAKE 1 TABLET BY MOUTH EVERY DAY   Melatonin 10 MG Tabs Take 10 mg by mouth at bedtime.   memantine 5 MG tablet Commonly known as: NAMENDA Take 1 tablet (5 mg total) by mouth 2 (two) times daily.   mirtazapine 45 MG tablet Commonly known as: REMERON Take 1 tablet (45 mg total) by mouth at bedtime.   montelukast 10 MG tablet Commonly known as: SINGULAIR Take 1 tablet (10 mg total) by mouth daily. What changed: when to take this   ondansetron 4 MG disintegrating tablet Commonly known  as: ZOFRAN-ODT Take 1 tablet (4 mg total) by mouth every 8 (eight) hours as needed for nausea or vomiting.   pantoprazole 40 MG tablet Commonly known as: PROTONIX Take 1 tablet (40 mg total) by mouth 2 (two) times daily before a meal.   ProAir HFA 108 (90 Base) MCG/ACT inhaler Generic drug: albuterol Use 2 puffs into the lungs  every 6 hours as needed for wheezing or shortness of  breath What changed: See the new instructions.   sertraline 50 MG tablet Commonly known as: Zoloft Take 1 tablet (50 mg total) by mouth daily.   vitamin C 1000 MG tablet Take 1,000 mg by mouth in the morning.   Vitamin D-3 125 MCG (5000 UT) Tabs Take 5,000 Units by mouth in the morning.               Discharge Care Instructions  (From admission, onward)           Start     Ordered   01/13/24 0000   No dressing needed        01/13/24 1610            Contact information for follow-up providers     Delynn Flavin M, DO. Schedule an appointment as soon as possible for a visit in 1 week(s).   Specialty: Family Medicine Contact information: [address] [place] [address] [phone]              Contact information for after-discharge care     Destination     [facility] Preferred SNF .   Service: Skilled Nursing Contact information: [address] [place] [address] [phone]                    Allergies  Allergen Reactions   Statins Other (See Comments)    myalgias   E-Mycin [Erythromycin] Other (See Comments)    Medication intolerance stomach cramps   Estrogens Other (See Comments)    Estrogen replacement Therapy intolerance mood changes   Sulfa Antibiotics Other (See Comments)    Causes severe stomach cramps    Vioxx [Rofecoxib] Other (See Comments)    Unknown reaction/interaction. Possible stomach cramps     Other Procedures/Studies: MR BRAIN WO CONTRAST Result Date: 01/10/2024 CLINICAL DATA:  Stroke, follow-up EXAM: MRI HEAD WITHOUT CONTRAST TECHNIQUE: Multiplanar, multiecho pulse sequences of the brain and surrounding structures were obtained without intravenous contrast. COMPARISON:  11/30/2020 MRI head, 01/10/2024 CT head FINDINGS: Evaluation is limited by motion artifact. Brain: No restricted diffusion to suggest acute or subacute infarct. No acute hemorrhage, mass, mass effect, or midline shift. No hydrocephalus or extra-axial collection. Pituitary and craniocervical junction within normal limits. No hemosiderin deposition to suggest remote hemorrhage. 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 Vascular: Poor visualization of the right vertebral artery flow void, which may be related to motion. Otherwise normal arterial flow voids. Skull and upper cervical spine: Normal marrow signal.  Sinuses/Orbits: No acute finding. IMPRESSION: Evaluation is limited by motion artifact. Within this limitation, no acute intracranial process. No evidence of acute or subacute infarct. Electronically Signed   By: Wiliam Ke M.D.   On: 01/10/2024 20:21   DG Chest Portable 1 View Result Date: 01/10/2024 CLINICAL DATA:  AMS EXAM: PORTABLE CHEST - 1 VIEW COMPARISON:  CT 12/20/2023 FINDINGS: Lungs are clear. Heart size and mediastinal contours are within normal limits. Aortic Atherosclerosis (ICD10-170.0). No effusion. Left humeral fixation hardware partially visualized. IMPRESSION: No  acute cardiopulmonary disease. Electronically Signed   By: Corlis Leak M.D.   On: 01/10/2024 17:29   CT HEAD CODE STROKE WO CONTRAST Result Date: 01/10/2024 CLINICAL DATA:  Code stroke. EXAM: CT HEAD WITHOUT CONTRAST TECHNIQUE: Contiguous axial images were obtained from the base of the skull through the vertex without intravenous contrast. RADIATION DOSE REDUCTION: This exam was performed according to the departmental dose-optimization program which includes automated exposure control, adjustment of the mA and/or kV according to patient size and/or use of iterative reconstruction technique. COMPARISON:  No prior CT head available, correlation is made with MRI head 11/30/2020 FINDINGS: Brain: No evidence of acute infarction, hemorrhage, mass, mass effect, or midline shift. No hydrocephalus or extra-axial collection. 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 Vascular: No hyperdense vessel. Atherosclerotic calcifications in the intracranial carotid and vertebral arteries. Skull: Negative for fracture or focal lesion. Sinuses/Orbits: No acute finding. Other: The mastoid air cells are well aerated. ASPECTS [facility] Stroke Program Early CT Score) - Ganglionic level infarction (caudate, lentiform nuclei, internal capsule, insula, M1-M3 cortex): 7 - Supraganglionic infarction  (M4-M6 cortex): 3 Total score (0-10 with 10 being normal): 10 IMPRESSION: No acute intracranial process.  ASPECTS is 10. Imaging results were communicated on 01/10/2024 at 5:10 pm to provider STACK via secure text paging. Electronically Signed   By: Wiliam Ke M.D.   On: 01/10/2024 17:10   DG Humerus Left Result Date: 12/26/2023 CLINICAL DATA:  Elective surgery. EXAM: LEFT HUMERUS - 2+ VIEW COMPARISON:  None Available. FINDINGS: Six fluoroscopic spot views of the left humerus obtained in the operating room. Plate and screw fixation of mid humerus. Fluoroscopy time 22.6 seconds. Dose 1 mGy. IMPRESSION: Intraoperative fluoroscopy during left humeral fixation. Electronically Signed   By: Narda Rutherford M.D.   On: 12/26/2023 17:05   DG C-Arm 1-60 Min-No Report Result Date: 12/26/2023 Fluoroscopy was utilized by the requesting physician.  No radiographic interpretation.   DG C-Arm 1-60 Min-No Report Result Date: 12/26/2023 Fluoroscopy was utilized by the requesting physician.  No radiographic interpretation.   CT CHEST ABDOMEN PELVIS W CONTRAST Result Date: 12/20/2023 CLINICAL DATA:  Unexplained weight loss.  * Tracking Code: BO *. EXAM: CT CHEST, ABDOMEN, AND PELVIS WITH CONTRAST TECHNIQUE: Multidetector CT imaging of the chest, abdomen and pelvis was performed following the standard protocol during bolus administration of intravenous contrast. RADIATION DOSE REDUCTION: This exam was performed according to the departmental dose-optimization program which includes automated exposure control, adjustment of the mA and/or kV according to patient size and/or use of iterative reconstruction technique. CONTRAST:  OMNIPAQUE IOHEXOL 300 MG/ML  SOLN COMPARISON:  Multiple priors including CT June 30, 2023 FINDINGS: CT CHEST FINDINGS Cardiovascular: Scattered aortic atherosclerosis. No central pulmonary embolus on this nondedicated study. Normal size heart. No significant pericardial effusion/thickening.  Mediastinum/Nodes: No suspicious thyroid nodule. No pathologically enlarged mediastinal, hilar or axillary lymph nodes. Gas fluid levels in a patulous esophagus. Lungs/Pleura: No suspicious pulmonary nodules or masses. No pleural effusion. No pneumothorax. Musculoskeletal: No aggressive lytic or blastic lesion of bone. Diffuse demineralization of bone. Multilevel degenerative change of the spine. CT ABDOMEN PELVIS FINDINGS Hepatobiliary: No suspicious hepatic lesion. Cholelithiasis without findings of acute cholecystitis. No biliary ductal dilation. Pancreas: No pancreatic ductal dilation or evidence of acute inflammation. Spleen: No splenomegaly or focal splenic lesion. Adrenals/Urinary Tract: Left adrenal nodule measuring 2.6 cm is stable over multiple prior imaging studies measuring Hounsfield units of -8 on noncontrast enhanced examination from November 21, 2015 compatible with a benign adenoma requiring no independent imaging follow-up. Right adrenal gland appears normal. No hydronephrosis. Kidneys demonstrate symmetric enhancement. Urinary bladder is unremarkable for degree of distension. Stomach/Bowel: No radiopaque enteric contrast material was administered. Stomach is unremarkable for degree of distension. No pathologic dilation of small or large bowel. No evidence of acute bowel inflammation. Large volume of formed stool in the colon compatible with constipation. Vascular/Lymphatic: Normal caliber abdominal aorta. Aortic atherosclerosis. Smooth IVC contours. The portal, splenic and superior mesenteric veins are patent. No pathologically enlarged abdominal or pelvic lymph nodes. Reproductive: Status post hysterectomy. No adnexal masses. Other: No significant abdominopelvic free fluid. Musculoskeletal: No aggressive lytic or blastic lesion of bone. Multilevel degenerative change of the spine. Chronic osseous changes of the pubic symphysis. IMPRESSION: 1. No suspicious mass or lymphadenopathy in the chest,  abdomen or pelvis. 2. Large volume of formed stool in the colon compatible with constipation. 3. Cholelithiasis without findings of acute cholecystitis. 4. Gas fluid levels in a patulous esophagus, which can be seen in the setting of gastroesophageal reflux. 5.  Aortic Atherosclerosis (ICD10-I70.0). Electronically Signed   By: Maudry Mayhew M.D.   On: 12/20/2023 16:02     TODAY-DAY OF DISCHARGE:  Subjective:   Nakyiah Kuck today has no headache,no chest abdominal pain,no new weakness tingling or numbness, feels much better wants to go home today.   Objective:   Blood pressure 124/76, pulse (!) 58, temperature 98 F (36.7 C), temperature source Oral, resp. rate 15, height 5' (1.524 m), weight 60 kg, SpO2 100%.  Intake/Output Summary (Last 24 hours) at 01/13/2024 0835 Last data filed at 01/12/2024 2024 Gross per 24 hour  Intake 720 ml  Output 600 ml  Net 120 ml   Filed Weights   01/10/24 1600 01/10/24 1722  Weight: 59.6 kg 60 kg    Exam: Awake Alert, Oriented *3, No new F.N deficits, Normal affect [place].AT,PERRAL Supple Neck,No JVD, No cervical lymphadenopathy appriciated.  Symmetrical Chest wall movement, Good air movement bilaterally, CTAB RRR,No Gallops,Rubs or new Murmurs, No Parasternal Heave +ve B.Sounds, Abd Soft, Non tender, No organomegaly appriciated, No rebound -guarding or rigidity. No Cyanosis, Clubbing or edema, No new Rash or bruise   PERTINENT RADIOLOGIC STUDIES: No results found.   PERTINENT LAB RESULTS: CBC: Recent Labs    01/11/24 0548 01/11/24 1114 01/12/24 0450  WBC 5.1  --  4.7  HGB 9.4* 9.9* 9.7*  HCT 27.7* 29.0* 28.3*  PLT 405*  --  370   CMET CMP     Component Value Date/Time   NA 136 01/12/2024 0450   NA 133 (L) 10/21/2023 1017   K 4.1 01/12/2024 0450   CL 109 01/12/2024 0450   CO2 20 (L) 01/12/2024 0450   GLUCOSE 105 (H) 01/12/2024 0450   BUN 16 01/12/2024 0450   BUN 24 10/21/2023 1017   CREATININE 0.85 01/12/2024 0450    CREATININE 0.54 05/09/2013 1052   CALCIUM 9.1 01/12/2024 0450   PROT 6.4 (L) 01/10/2024 1738   PROT 6.2 09/12/2023 0957   ALBUMIN 3.7 01/10/2024 1738   ALBUMIN 4.4 09/12/2023 0957   AST 26 01/10/2024 1738   ALT 17 01/10/2024 1738   ALKPHOS 87 01/10/2024 1738   BILITOT 0.6 01/10/2024 1738   BILITOT 0.2 09/12/2023 0957   GFR 76.78 06/19/2021 1603   EGFR 42 (L) 10/21/2023 1017   GFRNONAA >60 01/12/2024 0450   GFRNONAA >89 05/09/2013 1052    GFR Estimated Creatinine Clearance: 47.7 mL/min (by C-G formula  based on SCr of 0.85 mg/dL). No results for input(s): "LIPASE", "AMYLASE" in the last 72 hours. No results for input(s): "CKTOTAL", "CKMB", "CKMBINDEX", "TROPONINI" in the last 72 hours. Invalid input(s): "POCBNP" No results for input(s): "DDIMER" in the last 72 hours. No results for input(s): "HGBA1C" in the last 72 hours. No results for input(s): "CHOL", "HDL", "LDLCALC", "TRIG", "CHOLHDL", "LDLDIRECT" in the last 72 hours. Recent Labs    01/11/24 1101  TSH 2.618   No results for input(s): "VITAMINB12", "FOLATE", "FERRITIN", "TIBC", "IRON", "RETICCTPCT" in the last 72 hours. Coags: Recent Labs    01/10/24 1738  INR 1.0   Microbiology: No results found for this or any previous visit (from the past 240 hours).  FURTHER DISCHARGE INSTRUCTIONS:  Get Medicines reviewed and adjusted: Please take all your medications with you for your next visit with your Primary MD  Laboratory/radiological data: Please request your Primary MD to go over all hospital tests and procedure/radiological results at the follow up, please ask your Primary MD to get all Hospital records sent to his/her office.  In some cases, they will be blood work, cultures and biopsy results pending at the time of your discharge. Please request that your primary care M.D. goes through all the records of your hospital data and follows up on these results.  Also Note the following: If you experience worsening of  your admission symptoms, develop shortness of breath, life threatening emergency, suicidal or homicidal thoughts you must seek medical attention immediately by calling 911 or calling your MD immediately  if symptoms less severe.  You must read complete instructions/literature along with all the possible adverse reactions/side effects for all the Medicines you take and that have been prescribed to you. Take any new Medicines after you have completely understood and accpet all the possible adverse reactions/side effects.   Do not drive when taking Pain medications or sleeping medications (Benzodaizepines)  Do not take more than prescribed Pain, Sleep and Anxiety Medications. It is not advisable to combine anxiety,sleep and pain medications without talking with your primary care practitioner  Special Instructions: If you have smoked or chewed Tobacco  in the last 2 yrs please stop smoking, stop any regular Alcohol  and or any Recreational drug use.  Wear Seat belts while driving.  Please note: You were cared for by a hospitalist during your hospital stay. Once you are discharged, your primary care physician will handle any further medical issues. Please note that NO REFILLS for any discharge medications will be authorized once you are discharged, as it is imperative that you return to your primary care physician (or establish a relationship with a primary care physician if you do not have one) for your post hospital discharge needs so that they can reassess your need for medications and monitor your lab values.  Total Time spent coordinating discharge including counseling, education and face to face time equals greater than 30 minutes.  SignedJeoffrey Massed 01/13/2024 8:35 AM

## 2024-01-13 NOTE — Plan of Care (Signed)
   Problem: Education: Goal: Knowledge of General Education information will improve Description: Including pain rating scale, medication(s)/side effects and non-pharmacologic comfort measures Outcome: Progressing   Problem: Health Behavior/Discharge Planning: Goal: Ability to manage health-related needs will improve Outcome: Progressing   Problem: Clinical Measurements: Goal: Cardiovascular complication will be avoided Outcome: Progressing

## 2024-01-13 NOTE — Progress Notes (Signed)
AVS completed and printed.

## 2024-01-14 DIAGNOSIS — R471 Dysarthria and anarthria: Secondary | ICD-10-CM | POA: Diagnosis not present

## 2024-01-14 DIAGNOSIS — F0390 Unspecified dementia without behavioral disturbance: Secondary | ICD-10-CM | POA: Diagnosis not present

## 2024-01-14 DIAGNOSIS — R410 Disorientation, unspecified: Secondary | ICD-10-CM | POA: Diagnosis not present

## 2024-01-14 DIAGNOSIS — I10 Essential (primary) hypertension: Secondary | ICD-10-CM | POA: Diagnosis not present

## 2024-01-14 NOTE — Progress Notes (Signed)
Patient is alert , awake , resting quietly,  HOH, ambulatory with assistance. PT has OSA but did not request for CPAP . The nurse gave PT some snacks ,ice water , call light within reach.Stable vital sighs.

## 2024-01-14 NOTE — Progress Notes (Signed)
   01/14/24 1129  Mobility  Activity Ambulated with assistance in hallway  Level of Assistance Contact guard assist, steadying assist  Assistive Device Front wheel walker  Distance Ambulated (ft) 200 ft  LUE Weight Bearing Per Provider Order NWB  Activity Response Tolerated fair  Mobility Referral Yes  Mobility visit 1 Mobility  Mobility Specialist Start Time (ACUTE ONLY) 1105  Mobility Specialist Stop Time (ACUTE ONLY) 1129  Mobility Specialist Time Calculation (min) (ACUTE ONLY) 24 min   Mobility Specialist: Progress Note  Pt requested mobility session - eagar to mobilize - received in bed. C/o back itching. Returned to chair with all needs met - call bell within reach. Chair alarm on.   [name], BS Mobility Specialist Please contact via SecureChat or  Rehab office at [phone].

## 2024-01-14 NOTE — Progress Notes (Signed)
PROGRESS NOTE        PATIENT DETAILS Name: Melissa Bowman Age: 73 y.o. Sex: female Date of Birth: 21-Oct-1951 Admit Date: 01/10/2024 Admitting Physician Darlin Drop, DO [id number], Rozell Searing, DO  Brief Summary: Patient is a 73 y.o.  female with history of HTN, HLD, diet-controlled DM-2-who presented with dysarthria-initially there was concern for CVA-however MRI brain was negative-in she is now thought to have dysarthria related to polypharmacy (narcotics/muscle relaxants started recently after ORIF for left humerus fracture)  Significant events: 1/13>> ORIF for left humerus fracture 1/28>> admit to [facility]  Significant studies: 1/28>> MRI brain: No acute CVA  Significant microbiology data: None  Procedures: None  Consults: Neurology  Subjective: No complaints-lying comfortably in bed.  Objective: Vitals: Blood pressure (!) 95/52, pulse (!) 51, temperature 98 F (36.7 C), temperature source Oral, resp. rate 18, height 5' (1.524 m), weight 60 kg, SpO2 100%.   Exam: Gen Exam:Alert awake-not in any distress HEENT:atraumatic, normocephalic Chest: B/L clear to auscultation anteriorly CVS:S1S2 regular Abdomen:soft non tender, non distended Extremities:no edema Neurology: Non focal Skin: no rash  Pertinent Labs/Radiology:    Latest Ref Rng & Units 01/12/2024    4:50 AM 01/11/2024   11:14 AM 01/11/2024    5:48 AM  CBC  WBC 4.0 - 10.5 K/uL 4.7   5.1   Hemoglobin 12.0 - 15.0 g/dL 9.7  9.9  9.4   Hematocrit 36.0 - 46.0 % 28.3  29.0  27.7   Platelets 150 - 400 K/uL 370   405     Lab Results  Component Value Date   NA 136 01/12/2024   K 4.1 01/12/2024   CL 109 01/12/2024   CO2 20 (L) 01/12/2024      Assessment/Plan: Dysarthria Acute toxic encephalopathy Secondary to narcotics/muscle relaxants use (recent ORIF of left humerus) MRI brain negative for CVA-no further workup recommended by neurology Thankfully dysarthria is completely  resolved-her mentation is back to baseline-at this point awaiting SNF bed.  Recent humerus fracture-s/p ORIF 1/13 Stable Minimize narcotics/muscle relaxant use  HTN BP stable on irbesartan/HCTZ.    HLD Fenofibrate  GERD PPI  Normocytic anemia At baseline Follow CBC periodically Further workup deferred to outpatient setting  Dementia/mood disorder Stable Continue Zoloft/Remeron/Namenda  Recent fall resulting in left numerous fracture-s/p ORIF 1/13 Debility/deconditioning PT/OT eval-SNF recommended.  BMI: Estimated body mass index is 25.83 kg/m as calculated from the following:   Height as of this encounter: 5' (1.524 m).   Weight as of this encounter: 60 kg.   Code status:   Code Status: Full Code   DVT Prophylaxis: enoxaparin (LOVENOX) injection 40 mg Start: 01/10/24 2130   Family Communication: Spouse/daughter at bedside   Disposition Plan: Status is: Observation The patient remains OBS appropriate and will d/c before 2 midnights.   Planned Discharge Destination:Skilled nursing facility   Diet: Diet Order             Diet - low sodium heart healthy           Diet regular Room service appropriate? Yes; Fluid consistency: Thin  Diet effective now                     Antimicrobial agents: Anti-infectives (From admission, onward)    None        MEDICATIONS: Scheduled Meds:  vitamin C  1,000  mg Oral q AM   calcium carbonate  1,250 mg Oral Q breakfast   cholecalciferol  5,000 Units Oral q AM   enoxaparin (LOVENOX) injection  40 mg Subcutaneous Q24H   fenofibrate  160 mg Oral Daily   irbesartan  150 mg Oral Daily   And   hydrochlorothiazide  12.5 mg Oral Daily   loratadine  10 mg Oral Daily   melatonin  10 mg Oral QHS   memantine  5 mg Oral BID   mirtazapine  45 mg Oral QHS   montelukast  10 mg Oral Daily   pantoprazole  40 mg Oral BID AC   sertraline  50 mg Oral Daily   Continuous Infusions: PRN Meds:.acetaminophen, polyethylene  glycol, polyvinyl alcohol, prochlorperazine   I have personally reviewed following labs and imaging studies  LABORATORY DATA: CBC: Recent Labs  Lab 01/10/24 1738 01/10/24 1741 01/10/24 2322 01/11/24 0548 01/11/24 1114 01/12/24 0450  WBC 6.0  --  5.7 5.1  --  4.7  NEUTROABS 2.6  --   --   --   --   --   HGB 10.2* 10.2* 9.3* 9.4* 9.9* 9.7*  HCT 30.5* 30.0* 27.6* 27.7* 29.0* 28.3*  MCV 89.2  --  88.7 88.5  --  88.7  PLT 437*  --  394 405*  --  370    Basic Metabolic Panel: Recent Labs  Lab 01/10/24 1738 01/10/24 1741 01/10/24 2322 01/11/24 0548 01/11/24 1114 01/12/24 0450  NA 140 141  --  136 139 136  K 3.3* 3.3*  --  3.1* 4.2 4.1  CL 105 106  --  104  --  109  CO2 22  --   --  19*  --  20*  GLUCOSE 100* 94  --  126*  --  105*  BUN 15 15  --  14  --  16  CREATININE 0.97 1.00 0.98 1.25*  --  0.85  CALCIUM 9.9  --   --  9.1  --  9.1  MG  --   --   --  1.9  --  2.0  PHOS  --   --   --  3.3  --   --     GFR: Estimated Creatinine Clearance: 47.7 mL/min (by C-G formula based on SCr of 0.85 mg/dL).  Liver Function Tests: Recent Labs  Lab 01/10/24 1738  AST 26  ALT 17  ALKPHOS 87  BILITOT 0.6  PROT 6.4*  ALBUMIN 3.7   No results for input(s): "LIPASE", "AMYLASE" in the last 168 hours. Recent Labs  Lab 01/11/24 1101  AMMONIA 37*    Coagulation Profile: Recent Labs  Lab 01/10/24 1738  INR 1.0    Cardiac Enzymes: No results for input(s): "CKTOTAL", "CKMB", "CKMBINDEX", "TROPONINI" in the last 168 hours.  BNP (last 3 results) No results for input(s): "PROBNP" in the last 8760 hours.  Lipid Profile: No results for input(s): "CHOL", "HDL", "LDLCALC", "TRIG", "CHOLHDL", "LDLDIRECT" in the last 72 hours.  Thyroid Function Tests: Recent Labs    01/11/24 1101  TSH 2.618    Anemia Panel: No results for input(s): "VITAMINB12", "FOLATE", "FERRITIN", "TIBC", "IRON", "RETICCTPCT" in the last 72 hours.  Urine analysis:    Component Value Date/Time    COLORURINE STRAW (A) 01/10/2024 1942   APPEARANCEUR CLEAR 01/10/2024 1942   APPEARANCEUR Hazy (A) 03/07/2019 1500   LABSPEC 1.008 01/10/2024 1942   PHURINE 8.0 01/10/2024 1942   GLUCOSEU NEGATIVE 01/10/2024 1942   HGBUR NEGATIVE 01/10/2024  1942   BILIRUBINUR NEGATIVE 01/10/2024 1942   BILIRUBINUR Negative 03/07/2019 1500   KETONESUR NEGATIVE 01/10/2024 1942   PROTEINUR NEGATIVE 01/10/2024 1942   UROBILINOGEN negative 09/16/2015 1016   NITRITE NEGATIVE 01/10/2024 1942   LEUKOCYTESUR NEGATIVE 01/10/2024 1942    Sepsis Labs: Lactic Acid, Venous    Component Value Date/Time   LATICACIDVEN 1.9 01/10/2024 1741    MICROBIOLOGY: No results found for this or any previous visit (from the past 240 hours).  RADIOLOGY STUDIES/RESULTS: No results found.    LOS: 0 days   Jeoffrey Massed, MD  [facility]    To contact the attending provider between 7A-7P or the covering provider during after hours 7P-7A, please log into the web site [email or web address] and access using universal [place] password for that web site. If you do not have the password, please call the hospital operator.  01/14/2024, 10:36 AM

## 2024-01-14 NOTE — Plan of Care (Signed)
  Problem: Clinical Measurements: Goal: Diagnostic test results will improve Outcome: Progressing   Problem: Clinical Measurements: Goal: Cardiovascular complication will be avoided Outcome: Progressing   Problem: Nutrition: Goal: Adequate nutrition will be maintained Outcome: Progressing   

## 2024-01-15 DIAGNOSIS — R471 Dysarthria and anarthria: Secondary | ICD-10-CM | POA: Diagnosis not present

## 2024-01-15 DIAGNOSIS — F0390 Unspecified dementia without behavioral disturbance: Secondary | ICD-10-CM | POA: Diagnosis not present

## 2024-01-15 DIAGNOSIS — R410 Disorientation, unspecified: Secondary | ICD-10-CM | POA: Diagnosis not present

## 2024-01-15 DIAGNOSIS — I10 Essential (primary) hypertension: Secondary | ICD-10-CM | POA: Diagnosis not present

## 2024-01-15 NOTE — Progress Notes (Addendum)
Patient is alert, awake  resting quietly. Patient asked the nurse about CPAP machine . Respiratory therapist notified. PT has NS and sinus bradycardia 50s-62 . MD notified.

## 2024-01-15 NOTE — Progress Notes (Signed)
PROGRESS NOTE        PATIENT DETAILS Name: Melissa Bowman Age: 73 y.o. Sex: female Date of Birth: August 20, 1951 Admit Date: 01/10/2024 Admitting Physician Darlin Drop, DO [id number], Rozell Searing, DO  Brief Summary: Patient is a 73 y.o.  female with history of HTN, HLD, diet-controlled DM-2-who presented with dysarthria-initially there was concern for CVA-however MRI brain was negative-in she is now thought to have dysarthria related to polypharmacy (narcotics/muscle relaxants started recently after ORIF for left humerus fracture)  Significant events: 1/13>> ORIF for left humerus fracture 1/28>> admit to [facility]  Significant studies: 1/28>> MRI brain: No acute CVA  Significant microbiology data: None  Procedures: None  Consults: Neurology  Subjective: No complaints-no chest pain/nausea/vomiting or diarrhea-awaiting SNF bed.  Objective: Vitals: Blood pressure (!) 113/59, pulse 68, temperature 98.2 F (36.8 C), temperature source Oral, resp. rate 20, height 5' (1.524 m), weight 60 kg, SpO2 100%.   Exam: Awake/alert Nonfocal exam Abdomen is soft nontender  Pertinent Labs/Radiology:    Latest Ref Rng & Units 01/12/2024    4:50 AM 01/11/2024   11:14 AM 01/11/2024    5:48 AM  CBC  WBC 4.0 - 10.5 K/uL 4.7   5.1   Hemoglobin 12.0 - 15.0 g/dL 9.7  9.9  9.4   Hematocrit 36.0 - 46.0 % 28.3  29.0  27.7   Platelets 150 - 400 K/uL 370   405     Lab Results  Component Value Date   NA 136 01/12/2024   K 4.1 01/12/2024   CL 109 01/12/2024   CO2 20 (L) 01/12/2024      Assessment/Plan: Dysarthria Acute toxic encephalopathy Secondary to narcotics/muscle relaxants use (recent ORIF of left humerus) MRI brain negative for CVA-no further workup recommended by neurology Thankfully dysarthria is completely resolved-her mentation is back to baseline-at this point awaiting SNF bed.  Recent humerus fracture-s/p ORIF 1/13 Stable Minimize narcotics/muscle  relaxant use  HTN BP stable on irbesartan/HCTZ.    HLD Fenofibrate  GERD PPI  Normocytic anemia At baseline Follow CBC periodically Further workup deferred to outpatient setting  Dementia/mood disorder Stable Continue Zoloft/Remeron/Namenda  Recent fall resulting in left numerous fracture-s/p ORIF 1/13 Debility/deconditioning PT/OT eval-SNF recommended.  BMI: Estimated body mass index is 25.83 kg/m as calculated from the following:   Height as of this encounter: 5' (1.524 m).   Weight as of this encounter: 60 kg.   Code status:   Code Status: Full Code   DVT Prophylaxis: enoxaparin (LOVENOX) injection 40 mg Start: 01/10/24 2130   Family Communication: None at bedside   Disposition Plan: Status is: Observation The patient remains OBS appropriate and will d/c before 2 midnights.   Planned Discharge Destination:Skilled nursing facility   Diet: Diet Order             Diet - low sodium heart healthy           Diet regular Room service appropriate? Yes; Fluid consistency: Thin  Diet effective now                     Antimicrobial agents: Anti-infectives (From admission, onward)    None        MEDICATIONS: Scheduled Meds:  vitamin C  1,000 mg Oral q AM   calcium carbonate  1,250 mg Oral Q breakfast   cholecalciferol  5,000 Units  Oral q AM   enoxaparin (LOVENOX) injection  40 mg Subcutaneous Q24H   fenofibrate  160 mg Oral Daily   irbesartan  150 mg Oral Daily   And   hydrochlorothiazide  12.5 mg Oral Daily   loratadine  10 mg Oral Daily   melatonin  10 mg Oral QHS   memantine  5 mg Oral BID   mirtazapine  45 mg Oral QHS   montelukast  10 mg Oral Daily   pantoprazole  40 mg Oral BID AC   sertraline  50 mg Oral Daily   Continuous Infusions: PRN Meds:.acetaminophen, polyethylene glycol, polyvinyl alcohol, prochlorperazine   I have personally reviewed following labs and imaging studies  LABORATORY DATA: CBC: Recent Labs  Lab  01/10/24 1738 01/10/24 1741 01/10/24 2322 01/11/24 0548 01/11/24 1114 01/12/24 0450  WBC 6.0  --  5.7 5.1  --  4.7  NEUTROABS 2.6  --   --   --   --   --   HGB 10.2* 10.2* 9.3* 9.4* 9.9* 9.7*  HCT 30.5* 30.0* 27.6* 27.7* 29.0* 28.3*  MCV 89.2  --  88.7 88.5  --  88.7  PLT 437*  --  394 405*  --  370    Basic Metabolic Panel: Recent Labs  Lab 01/10/24 1738 01/10/24 1741 01/10/24 2322 01/11/24 0548 01/11/24 1114 01/12/24 0450  NA 140 141  --  136 139 136  K 3.3* 3.3*  --  3.1* 4.2 4.1  CL 105 106  --  104  --  109  CO2 22  --   --  19*  --  20*  GLUCOSE 100* 94  --  126*  --  105*  BUN 15 15  --  14  --  16  CREATININE 0.97 1.00 0.98 1.25*  --  0.85  CALCIUM 9.9  --   --  9.1  --  9.1  MG  --   --   --  1.9  --  2.0  PHOS  --   --   --  3.3  --   --     GFR: Estimated Creatinine Clearance: 47.7 mL/min (by C-G formula based on SCr of 0.85 mg/dL).  Liver Function Tests: Recent Labs  Lab 01/10/24 1738  AST 26  ALT 17  ALKPHOS 87  BILITOT 0.6  PROT 6.4*  ALBUMIN 3.7   No results for input(s): "LIPASE", "AMYLASE" in the last 168 hours. Recent Labs  Lab 01/11/24 1101  AMMONIA 37*    Coagulation Profile: Recent Labs  Lab 01/10/24 1738  INR 1.0    Cardiac Enzymes: No results for input(s): "CKTOTAL", "CKMB", "CKMBINDEX", "TROPONINI" in the last 168 hours.  BNP (last 3 results) No results for input(s): "PROBNP" in the last 8760 hours.  Lipid Profile: No results for input(s): "CHOL", "HDL", "LDLCALC", "TRIG", "CHOLHDL", "LDLDIRECT" in the last 72 hours.  Thyroid Function Tests: No results for input(s): "TSH", "T4TOTAL", "FREET4", "T3FREE", "THYROIDAB" in the last 72 hours.   Anemia Panel: No results for input(s): "VITAMINB12", "FOLATE", "FERRITIN", "TIBC", "IRON", "RETICCTPCT" in the last 72 hours.  Urine analysis:    Component Value Date/Time   COLORURINE STRAW (A) 01/10/2024 1942   APPEARANCEUR CLEAR 01/10/2024 1942   APPEARANCEUR Hazy (A)  03/07/2019 1500   LABSPEC 1.008 01/10/2024 1942   PHURINE 8.0 01/10/2024 1942   GLUCOSEU NEGATIVE 01/10/2024 1942   HGBUR NEGATIVE 01/10/2024 1942   BILIRUBINUR NEGATIVE 01/10/2024 1942   BILIRUBINUR Negative 03/07/2019 1500   KETONESUR NEGATIVE  01/10/2024 1942   PROTEINUR NEGATIVE 01/10/2024 1942   UROBILINOGEN negative 09/16/2015 1016   NITRITE NEGATIVE 01/10/2024 1942   LEUKOCYTESUR NEGATIVE 01/10/2024 1942    Sepsis Labs: Lactic Acid, Venous    Component Value Date/Time   LATICACIDVEN 1.9 01/10/2024 1741    MICROBIOLOGY: No results found for this or any previous visit (from the past 240 hours).  RADIOLOGY STUDIES/RESULTS: No results found.    LOS: 0 days   Jeoffrey Massed, MD  [facility]    To contact the attending provider between 7A-7P or the covering provider during after hours 7P-7A, please log into the web site [email or web address] and access using universal [place] password for that web site. If you do not have the password, please call the hospital operator.  01/15/2024, 10:42 AM

## 2024-01-15 NOTE — Plan of Care (Signed)
  Problem: Clinical Measurements: Goal: Cardiovascular complication will be avoided Outcome: Progressing   Problem: Clinical Measurements: Goal: Cardiovascular complication will be avoided Outcome: Progressing   

## 2024-01-16 DIAGNOSIS — R531 Weakness: Secondary | ICD-10-CM | POA: Diagnosis not present

## 2024-01-16 DIAGNOSIS — G9349 Other encephalopathy: Secondary | ICD-10-CM | POA: Diagnosis not present

## 2024-01-16 DIAGNOSIS — E119 Type 2 diabetes mellitus without complications: Secondary | ICD-10-CM | POA: Diagnosis not present

## 2024-01-16 DIAGNOSIS — R2689 Other abnormalities of gait and mobility: Secondary | ICD-10-CM | POA: Diagnosis not present

## 2024-01-16 DIAGNOSIS — R410 Disorientation, unspecified: Secondary | ICD-10-CM | POA: Diagnosis not present

## 2024-01-16 DIAGNOSIS — R4182 Altered mental status, unspecified: Secondary | ICD-10-CM | POA: Diagnosis not present

## 2024-01-16 DIAGNOSIS — M8580 Other specified disorders of bone density and structure, unspecified site: Secondary | ICD-10-CM | POA: Diagnosis not present

## 2024-01-16 DIAGNOSIS — K219 Gastro-esophageal reflux disease without esophagitis: Secondary | ICD-10-CM | POA: Diagnosis not present

## 2024-01-16 DIAGNOSIS — J45909 Unspecified asthma, uncomplicated: Secondary | ICD-10-CM | POA: Diagnosis not present

## 2024-01-16 DIAGNOSIS — D649 Anemia, unspecified: Secondary | ICD-10-CM | POA: Diagnosis not present

## 2024-01-16 DIAGNOSIS — S42202D Unspecified fracture of upper end of left humerus, subsequent encounter for fracture with routine healing: Secondary | ICD-10-CM | POA: Diagnosis not present

## 2024-01-16 DIAGNOSIS — R471 Dysarthria and anarthria: Secondary | ICD-10-CM | POA: Diagnosis not present

## 2024-01-16 DIAGNOSIS — E785 Hyperlipidemia, unspecified: Secondary | ICD-10-CM | POA: Diagnosis not present

## 2024-01-16 DIAGNOSIS — F39 Unspecified mood [affective] disorder: Secondary | ICD-10-CM | POA: Diagnosis not present

## 2024-01-16 DIAGNOSIS — I10 Essential (primary) hypertension: Secondary | ICD-10-CM | POA: Diagnosis not present

## 2024-01-16 DIAGNOSIS — F419 Anxiety disorder, unspecified: Secondary | ICD-10-CM | POA: Diagnosis not present

## 2024-01-16 DIAGNOSIS — R41841 Cognitive communication deficit: Secondary | ICD-10-CM | POA: Diagnosis not present

## 2024-01-16 DIAGNOSIS — E7849 Other hyperlipidemia: Secondary | ICD-10-CM | POA: Diagnosis not present

## 2024-01-16 DIAGNOSIS — F0390 Unspecified dementia without behavioral disturbance: Secondary | ICD-10-CM | POA: Diagnosis not present

## 2024-01-16 DIAGNOSIS — F32A Depression, unspecified: Secondary | ICD-10-CM | POA: Diagnosis not present

## 2024-01-16 DIAGNOSIS — H9193 Unspecified hearing loss, bilateral: Secondary | ICD-10-CM | POA: Diagnosis not present

## 2024-01-16 DIAGNOSIS — Z9181 History of falling: Secondary | ICD-10-CM | POA: Diagnosis not present

## 2024-01-16 NOTE — Progress Notes (Signed)
Patient is alert, awake.  Cpap is not available on RT department per respiratory therapist.

## 2024-01-16 NOTE — Plan of Care (Signed)
   Problem: Health Behavior/Discharge Planning: Goal: Ability to manage health-related needs will improve Outcome: Progressing   Problem: Clinical Measurements: Goal: Ability to maintain clinical measurements within normal limits will improve Outcome: Progressing

## 2024-01-16 NOTE — Progress Notes (Signed)
Report called to [facility] and given to [facility] LPN

## 2024-01-16 NOTE — Progress Notes (Signed)
   01/16/24 0942  Mobility  Activity Transferred from bed to chair  Level of Assistance Contact guard assist, steadying assist  Assistive Device Front wheel walker  Distance Ambulated (ft) 10 ft  LUE Weight Bearing Per Provider Order NWB  Activity Response Tolerated fair  Mobility Referral Yes  Mobility visit 1 Mobility  Mobility Specialist Start Time (ACUTE ONLY) 0929  Mobility Specialist Stop Time (ACUTE ONLY) 0942  Mobility Specialist Time Calculation (min) (ACUTE ONLY) 13 min   Mobility Specialist: Progress Note  Pre-Mobility:      HR 66, SpO2 100% RA Post-Mobility:     HR 65 , SpO2 100% RA  Pt agreeable to mobility session - received in bed. C/o upper back itching - MS applied barrier cream to pt's back. Returned to chair with all needs met - call bell within reach. Chair alarm on.   [name], BS Mobility Specialist Please contact via SecureChat or  Rehab office at [phone].

## 2024-01-16 NOTE — TOC Transition Note (Signed)
Transition of Care [facility]) - Discharge Note   Patient Details  Name: Melissa Bowman MRN: [id number] Date of Birth: 06/30/1951  [facility]) CM/SW Contact:  Mearl Latin, LCSW Phone Number: 01/16/2024, 9:32 AM   Clinical Narrative:    Patient will DC to: [facility] SNF Anticipated DC date: 01/16/24 Family notified: Spouse Transport by: Spouse via car   Per MD patient ready for DC to [facility]. RN to call report prior to discharge [phone] room 132-2). RN, patient, patient's family, and facility notified of DC. Discharge Summary and FL2 sent to facility.   CSW will sign off for now as social work intervention is no longer needed. Please consult [place] again if new needs arise.     Final next level of care: Skilled Nursing Facility Barriers to Discharge: Barriers Resolved   Patient Goals and CMS Choice Patient states their goals for this hospitalization and ongoing recovery are:: Return home CMS Medicare.gov Compare Post Acute Care list provided to:: Patient Represenative (must comment) Choice offered to / list presented to : Spouse [place] ownership interest in [facility].provided to:: Spouse    Discharge Placement   Existing PASRR number confirmed : 01/16/24          Patient chooses bed at:  [facility]) Patient to be transferred to facility by: car Name of family member notified: Spouse Patient and family notified of of transfer: 01/16/24  Discharge Plan and Services Additional resources added to the After Visit Summary for   In-house Referral: Clinical Social Work Discharge Planning Services: CM Consult Post Acute Care Choice: Home Health                               Social Drivers of Health (SDOH) Interventions SDOH Screenings   Food Insecurity: Patient Declined (01/11/2024)  Housing: Patient Declined (01/11/2024)  Transportation Needs: Patient Declined (01/11/2024)  Utilities: Patient Declined (01/11/2024)   Alcohol Screen: Low Risk  (04/25/2023)  Depression (PHQ2-9): Low Risk  (09/12/2023)  Financial Resource Strain: Low Risk  (04/25/2023)  Physical Activity: Insufficiently Active (04/25/2023)  Social Connections: Unknown (01/11/2024)  Stress: No Stress Concern Present (04/25/2023)  Tobacco Use: Low Risk  (01/10/2024)     Readmission Risk Interventions     No data to display

## 2024-01-16 NOTE — Progress Notes (Signed)
Discharge instructions reviewed with pt and her husband.  Copy of instructions given to pt. No new scripts to give pt. Pt discharging to rehab facility, pt's RN has already called report, pt's husband is transporting pt to the facility via car. Pt getting dressed at this time.  Pt to be d/c'd via wheelchair with belongings, with husband, and will be escorted by hospital volunteer.  Porcia Morganti,RN SWOT

## 2024-01-16 NOTE — TOC Progression Note (Signed)
Transition of Care [facility]) - Progression Note    Patient Details  Name: Melissa Bowman MRN: [id number] Date of Birth: 07/06/1951  [facility]) CM/SW Contact  Mearl Latin, LCSW Phone Number: 01/16/2024, 9:00 AM  Clinical Narrative:    Insurance approval received for [employer], Certification # [id number], effective until 01/20/24. Facility and spouse updated. Spouse to transport patient via car.    Expected Discharge Plan: Skilled Nursing Facility Barriers to Discharge: Barriers Resolved  Expected Discharge Plan and Services In-house Referral: Clinical Social Work Discharge Planning Services: CM Consult Post Acute Care Choice: Home Health Living arrangements for the past 2 months: Single Family Home Expected Discharge Date: 01/13/24                                     Social Determinants of Health (SDOH) Interventions SDOH Screenings   Food Insecurity: Patient Declined (01/11/2024)  Housing: Patient Declined (01/11/2024)  Transportation Needs: Patient Declined (01/11/2024)  Utilities: Patient Declined (01/11/2024)  Alcohol Screen: Low Risk  (04/25/2023)  Depression (PHQ2-9): Low Risk  (09/12/2023)  Financial Resource Strain: Low Risk  (04/25/2023)  Physical Activity: Insufficiently Active (04/25/2023)  Social Connections: Unknown (01/11/2024)  Stress: No Stress Concern Present (04/25/2023)  Tobacco Use: Low Risk  (01/10/2024)    Readmission Risk Interventions     No data to display

## 2024-01-16 NOTE — Progress Notes (Signed)
Physical Therapy Treatment Patient Details Name: Melissa Bowman MRN: [id number] DOB: [birth year] Today's Date: 01/16/2024   History of Present Illness Pt is 73 yo female admitted on 01/10/24 with AMS and difficulty speaking.  Brain MRI negative for CVA.  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  She had recent ORIF L humerus on 12/26/23, per op note sling for comfort, can use arm for ADL, no lifting over 2 pounds, did not mention weighbearing status. She had hx including HTN, HLD, DM, osteopenia, mild cognitive impairment.    PT Comments  Pt tolerated treatment well today. Pt able to progress ambulation in hallway with HHA. No change in DC/DME recs at this time. Pt anticipate DC to SNF today.    If plan is discharge home, recommend the following: A lot of help with walking and/or transfers;A lot of help with bathing/dressing/bathroom   Can travel by private vehicle     Yes  Equipment Recommendations  Wheelchair cushion (measurements PT);Wheelchair (measurements PT)    Recommendations for Other Services       Precautions / Restrictions Precautions Precautions: Fall;Other (comment) Precaution Comments: Pt with recent L humerus ORIF 12/26/23, per op note:sling for comfort, can use arm for ADL, no lifting over 2 pounds, did not mention weighbearing status will treat NWB Restrictions Weight Bearing Restrictions Per Provider Order: Yes LUE Weight Bearing Per Provider Order: Non weight bearing     Mobility  Bed Mobility               General bed mobility comments: Pt up in recliner    Transfers Overall transfer level: Needs assistance Equipment used: None Transfers: Sit to/from Stand Sit to Stand: Contact guard assist           General transfer comment: HHA for stability    Ambulation/Gait Ambulation/Gait assistance: Min assist Gait Distance (Feet): 300 Feet Assistive device: 1 person hand held assist Gait Pattern/deviations:  Step-through pattern, Drifts right/left, Decreased stride length Gait velocity: decreased     General Gait Details: Pt demonstrates unsteady gait requiring UE support, but no LOB. Tendency for trunk rotation towards the R due to R HHA.   Stairs             Wheelchair Mobility     Tilt Bed    Modified Rankin (Stroke Patients Only)       Balance Overall balance assessment: Needs assistance Sitting-balance support: No upper extremity supported Sitting balance-Leahy Scale: Fair     Standing balance support: Single extremity supported, During functional activity, Reliant on assistive device for balance Standing balance-Leahy Scale: Poor Standing balance comment: requires UE support for stability                            Cognition Arousal: Alert Behavior During Therapy: WFL for tasks assessed/performed Overall Cognitive Status: Within Functional Limits for tasks assessed                                          Exercises      General Comments General comments (skin integrity, edema, etc.): VSS      Pertinent Vitals/Pain Pain Assessment Pain Assessment: No/denies pain    Home Living                          Prior  Function            PT Goals (current goals can now be found in the care plan section) Progress towards PT goals: Progressing toward goals    Frequency    Min 1X/week      PT Plan      Co-evaluation              AM-PAC PT "6 Clicks" Mobility   Outcome Measure  Help needed turning from your back to your side while in a flat bed without using bedrails?: A Little Help needed moving from lying on your back to sitting on the side of a flat bed without using bedrails?: A Little Help needed moving to and from a bed to a chair (including a wheelchair)?: A Little Help needed standing up from a chair using your arms (e.g., wheelchair or bedside chair)?: A Little Help needed to walk in hospital  room?: A Little Help needed climbing 3-5 steps with a railing? : A Lot 6 Click Score: 17    End of Session Equipment Utilized During Treatment: Gait belt Activity Tolerance: Patient tolerated treatment well Patient left: in chair;with chair alarm set;with call bell/phone within reach Nurse Communication: Mobility status PT Visit Diagnosis: Other abnormalities of gait and mobility (R26.89);Muscle weakness (generalized) (M62.81);Dizziness and giddiness (R42)     Time: 7829-5621 PT Time Calculation (min) (ACUTE ONLY): 9 min  Charges:    $Gait Training: 8-22 mins PT General Charges $$ ACUTE PT VISIT: 1 Visit                     Shela Nevin, PT, DPT Acute Rehab Services [phone]    Gladys Damme 01/16/2024, 1:27 PM

## 2024-01-16 NOTE — Discharge Summary (Addendum)
PATIENT DETAILS Name: Melissa Bowman Age: 73 y.o. Sex: female Date of Birth: Nov 10, 1951 MRN: [id number]. Admitting Physician: Darlin Drop, DO WUJ:[id number], Rozell Searing, DO  Admit Date: 01/10/2024 Discharge date: 01/16/2024  Recommendations for Outpatient Follow-up:  Follow up with PCP in 1-2 weeks Please obtain CMP/CBC in one week  Admitted From:  Home  Disposition: SNF   Discharge Condition: good  CODE STATUS:   Code Status: Full Code   Diet recommendation:  Diet Order             Diet - low sodium heart healthy           Diet regular Room service appropriate? Yes; Fluid consistency: Thin  Diet effective now                    Brief Summary: Patient is a 73 y.o.  female with history of HTN, HLD, diet-controlled DM-2-who presented with dysarthria-initially there was concern for CVA-however MRI brain was negative-in she is now thought to have dysarthria related to polypharmacy (narcotics/muscle relaxants started recently after ORIF for left humerus fracture)   Significant events: 1/13>> ORIF for left humerus fracture 1/28>> admit to [facility]   Significant studies: 1/28>> MRI brain: No acute CVA   Significant microbiology data: None   Procedures: None   Consults: Neurology  Brief Hospital Course: Dysarthria Acute toxic encephalopathy Secondary to narcotics/muscle relaxants use (recent ORIF of left humerus) MRI brain negative for CVA-no further workup recommended by neurology Thankfully dysarthria is completely resolved-her mentation is back to baseline   Recent humerus fracture-s/p ORIF 1/13 Stable Minimize narcotics/muscle relaxant use   HTN BP stable on irbesartan/hydrochlorothiazide Follow/optimize.   HLD Fenofibrate   GERD PPI   Normocytic anemia At baseline history of significant chronic anemia Further workup deferred to outpatient setting   Dementia/mood disorder Stable Continue Zoloft/Remeron/Namenda   Recent fall resulting  in left numerous fracture-s/p ORIF 1/13 Debility/deconditioning PT/OT eval-SNF recommended.   BMI: Estimated body mass index is 25.83 kg/m as calculated from the following:   Height as of this encounter: 5' (1.524 m).   Weight as of this encounter: 60 kg.   Discharge Diagnoses:  Principal Problem:   AMS (altered mental status)   Discharge Instructions:  Activity:  As tolerated with Full fall precautions use walker/cane & assistance as needed   Discharge Instructions     Diet - low sodium heart healthy   Complete by: As directed    Discharge instructions   Complete by: As directed    Follow with Primary MD  [name], DO in 1-2 weeks  Please get a complete blood count and chemistry panel checked by your Primary MD at your next visit, and again as instructed by your Primary MD.  Get Medicines reviewed and adjusted: Please take all your medications with you for your next visit with your Primary MD  Laboratory/radiological data: Please request your Primary MD to go over all hospital tests and procedure/radiological results at the follow up, please ask your Primary MD to get all Hospital records sent to his/her office.  In some cases, they will be blood work, cultures and biopsy results pending at the time of your discharge. Please request that your primary care M.D. follows up on these results.  Also Note the following: If you experience worsening of your admission symptoms, develop shortness of breath, life threatening emergency, suicidal or homicidal thoughts you must seek medical attention immediately by calling 911 or calling your MD immediately  if symptoms less severe.  You must read complete instructions/literature along with all the possible adverse reactions/side effects for all the Medicines you take and that have been prescribed to you. Take any new Medicines after you have completely understood and accpet all the possible adverse reactions/side effects.    Do not drive when taking Pain medications or sleeping medications (Benzodaizepines)  Do not take more than prescribed Pain, Sleep and Anxiety Medications. It is not advisable to combine anxiety,sleep and pain medications without talking with your primary care practitioner  Special Instructions: If you have smoked or chewed Tobacco  in the last 2 yrs please stop smoking, stop any regular Alcohol  and or any Recreational drug use.  Wear Seat belts while driving.  Please note: You were cared for by a hospitalist during your hospital stay. Once you are discharged, your primary care physician will handle any further medical issues. Please note that NO REFILLS for any discharge medications will be authorized once you are discharged, as it is imperative that you return to your primary care physician (or establish a relationship with a primary care physician if you do not have one) for your post hospital discharge needs so that they can reassess your need for medications and monitor your lab values.   Increase activity slowly   Complete by: As directed    No dressing needed   Complete by: As directed       Allergies as of 01/16/2024       Reactions   Statins Other (See Comments)   myalgias   E-mycin [erythromycin] Other (See Comments)   Medication intolerance stomach cramps   Estrogens Other (See Comments)   Estrogen replacement Therapy intolerance mood changes   Sulfa Antibiotics Other (See Comments)   Causes severe stomach cramps    Vioxx [rofecoxib] Other (See Comments)   Unknown reaction/interaction. Possible stomach cramps        Medication List     STOP taking these medications    omeprazole 40 MG capsule Commonly known as: PRILOSEC   oxyCODONE-acetaminophen 5-325 MG tablet Commonly known as: PERCOCET/ROXICET       TAKE these medications    CALCIUM PO Take 1,200 mg by mouth in the morning.   Carboxymethylcellulose Sodium 0.25 % Soln Place 1-2 drops into both eyes  as needed (dry eyes).   desloratadine 5 MG tablet Commonly known as: CLARINEX Take 5 mg by mouth in the morning.   famotidine 20 MG tablet Commonly known as: PEPCID Take 1 tablet (20 mg total) by mouth at bedtime. What changed: when to take this   fenofibrate 160 MG tablet TAKE 1 TABLET BY MOUTH EVERY DAY   glycerin adult 2 g suppository Place 1 suppository rectally as needed for constipation.   ibuprofen 200 MG tablet Commonly known as: ADVIL Take 200-400 mg by mouth every 8 (eight) hours as needed (pain.).   irbesartan-hydrochlorothiazide 150-12.5 MG tablet Commonly known as: AVALIDE TAKE 1 TABLET BY MOUTH EVERY DAY   Melatonin 10 MG Tabs Take 10 mg by mouth at bedtime.   memantine 5 MG tablet Commonly known as: NAMENDA Take 1 tablet (5 mg total) by mouth 2 (two) times daily.   mirtazapine 45 MG tablet Commonly known as: REMERON Take 1 tablet (45 mg total) by mouth at bedtime.   montelukast 10 MG tablet Commonly known as: SINGULAIR Take 1 tablet (10 mg total) by mouth daily. What changed: when to take this   ondansetron 4 MG disintegrating tablet Commonly known  as: ZOFRAN-ODT Take 1 tablet (4 mg total) by mouth every 8 (eight) hours as needed for nausea or vomiting.   pantoprazole 40 MG tablet Commonly known as: PROTONIX Take 1 tablet (40 mg total) by mouth 2 (two) times daily before a meal.   ProAir HFA 108 (90 Base) MCG/ACT inhaler Generic drug: albuterol Use 2 puffs into the lungs  every 6 hours as needed for wheezing or shortness of  breath What changed: See the new instructions.   sertraline 50 MG tablet Commonly known as: Zoloft Take 1 tablet (50 mg total) by mouth daily.   vitamin C 1000 MG tablet Take 1,000 mg by mouth in the morning.   Vitamin D-3 125 MCG (5000 UT) Tabs Take 5,000 Units by mouth in the morning.               Discharge Care Instructions  (From admission, onward)           Start     Ordered   01/13/24 0000  No  dressing needed        01/13/24 2956            Contact information for follow-up providers     Delynn Flavin M, DO. Schedule an appointment as soon as possible for a visit in 1 week(s).   Specialty: Family Medicine Contact information: [address] [place] [address] [phone]              Contact information for after-discharge care     Destination     [facility] Preferred SNF .   Service: Skilled Nursing Contact information: [address] [place] [address] [phone]                    Allergies  Allergen Reactions   Statins Other (See Comments)    myalgias   E-Mycin [Erythromycin] Other (See Comments)    Medication intolerance stomach cramps   Estrogens Other (See Comments)    Estrogen replacement Therapy intolerance mood changes   Sulfa Antibiotics Other (See Comments)    Causes severe stomach cramps    Vioxx [Rofecoxib] Other (See Comments)    Unknown reaction/interaction. Possible stomach cramps     Other Procedures/Studies: MR BRAIN WO CONTRAST Result Date: 01/10/2024 CLINICAL DATA:  Stroke, follow-up EXAM: MRI HEAD WITHOUT CONTRAST TECHNIQUE: Multiplanar, multiecho pulse sequences of the brain and surrounding structures were obtained without intravenous contrast. COMPARISON:  11/30/2020 MRI head, 01/10/2024 CT head FINDINGS: Evaluation is limited by motion artifact. Brain: No restricted diffusion to suggest acute or subacute infarct. No acute hemorrhage, mass, mass effect, or midline shift. No hydrocephalus or extra-axial collection. Pituitary and craniocervical junction within normal limits. No hemosiderin deposition to suggest remote hemorrhage. 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 Vascular: Poor visualization of the right vertebral artery flow void, which may be related to motion. Otherwise normal arterial flow voids. Skull and upper cervical spine: Normal marrow signal. Sinuses/Orbits:  No acute finding. IMPRESSION: Evaluation is limited by motion artifact. Within this limitation, no acute intracranial process. No evidence of acute or subacute infarct. Electronically Signed   By: Wiliam Ke M.D.   On: 01/10/2024 20:21   DG Chest Portable 1 View Result Date: 01/10/2024 CLINICAL DATA:  AMS EXAM: PORTABLE CHEST - 1 VIEW COMPARISON:  CT 12/20/2023 FINDINGS: Lungs are clear. Heart size and mediastinal contours are within normal limits. Aortic Atherosclerosis (ICD10-170.0). No effusion. Left humeral fixation hardware partially visualized. IMPRESSION: No  acute cardiopulmonary disease. Electronically Signed   By: Corlis Leak M.D.   On: 01/10/2024 17:29   CT HEAD CODE STROKE WO CONTRAST Result Date: 01/10/2024 CLINICAL DATA:  Code stroke. EXAM: CT HEAD WITHOUT CONTRAST TECHNIQUE: Contiguous axial images were obtained from the base of the skull through the vertex without intravenous contrast. RADIATION DOSE REDUCTION: This exam was performed according to the departmental dose-optimization program which includes automated exposure control, adjustment of the mA and/or kV according to patient size and/or use of iterative reconstruction technique. COMPARISON:  No prior CT head available, correlation is made with MRI head 11/30/2020 FINDINGS: Brain: No evidence of acute infarction, hemorrhage, mass, mass effect, or midline shift. No hydrocephalus or extra-axial collection. 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 Vascular: No hyperdense vessel. Atherosclerotic calcifications in the intracranial carotid and vertebral arteries. Skull: Negative for fracture or focal lesion. Sinuses/Orbits: No acute finding. Other: The mastoid air cells are well aerated. ASPECTS [facility] Stroke Program Early CT Score) - Ganglionic level infarction (caudate, lentiform nuclei, internal capsule, insula, M1-M3 cortex): 7 - Supraganglionic infarction (M4-M6 cortex): 3  Total score (0-10 with 10 being normal): 10 IMPRESSION: No acute intracranial process.  ASPECTS is 10. Imaging results were communicated on 01/10/2024 at 5:10 pm to provider STACK via secure text paging. Electronically Signed   By: Wiliam Ke M.D.   On: 01/10/2024 17:10   DG Humerus Left Result Date: 12/26/2023 CLINICAL DATA:  Elective surgery. EXAM: LEFT HUMERUS - 2+ VIEW COMPARISON:  None Available. FINDINGS: Six fluoroscopic spot views of the left humerus obtained in the operating room. Plate and screw fixation of mid humerus. Fluoroscopy time 22.6 seconds. Dose 1 mGy. IMPRESSION: Intraoperative fluoroscopy during left humeral fixation. Electronically Signed   By: Narda Rutherford M.D.   On: 12/26/2023 17:05   DG C-Arm 1-60 Min-No Report Result Date: 12/26/2023 Fluoroscopy was utilized by the requesting physician.  No radiographic interpretation.   DG C-Arm 1-60 Min-No Report Result Date: 12/26/2023 Fluoroscopy was utilized by the requesting physician.  No radiographic interpretation.   CT CHEST ABDOMEN PELVIS W CONTRAST Result Date: 12/20/2023 CLINICAL DATA:  Unexplained weight loss.  * Tracking Code: BO *. EXAM: CT CHEST, ABDOMEN, AND PELVIS WITH CONTRAST TECHNIQUE: Multidetector CT imaging of the chest, abdomen and pelvis was performed following the standard protocol during bolus administration of intravenous contrast. RADIATION DOSE REDUCTION: This exam was performed according to the departmental dose-optimization program which includes automated exposure control, adjustment of the mA and/or kV according to patient size and/or use of iterative reconstruction technique. CONTRAST:  OMNIPAQUE IOHEXOL 300 MG/ML  SOLN COMPARISON:  Multiple priors including CT June 30, 2023 FINDINGS: CT CHEST FINDINGS Cardiovascular: Scattered aortic atherosclerosis. No central pulmonary embolus on this nondedicated study. Normal size heart. No significant pericardial effusion/thickening. Mediastinum/Nodes: No  suspicious thyroid nodule. No pathologically enlarged mediastinal, hilar or axillary lymph nodes. Gas fluid levels in a patulous esophagus. Lungs/Pleura: No suspicious pulmonary nodules or masses. No pleural effusion. No pneumothorax. Musculoskeletal: No aggressive lytic or blastic lesion of bone. Diffuse demineralization of bone. Multilevel degenerative change of the spine. CT ABDOMEN PELVIS FINDINGS Hepatobiliary: No suspicious hepatic lesion. Cholelithiasis without findings of acute cholecystitis. No biliary ductal dilation. Pancreas: No pancreatic ductal dilation or evidence of acute inflammation. Spleen: No splenomegaly or focal splenic lesion. Adrenals/Urinary Tract: Left adrenal nodule measuring 2.6 cm is stable over multiple prior imaging studies measuring Hounsfield units of -8 on noncontrast enhanced examination from November 21, 2015 compatible with a benign adenoma requiring no independent imaging follow-up. Right adrenal gland appears normal. No hydronephrosis. Kidneys demonstrate symmetric enhancement. Urinary bladder is unremarkable for degree of distension. Stomach/Bowel: No radiopaque enteric contrast material was administered. Stomach is unremarkable for degree of distension. No pathologic dilation of small or large bowel. No evidence of acute bowel inflammation. Large volume of formed stool in the colon compatible with constipation. Vascular/Lymphatic: Normal caliber abdominal aorta. Aortic atherosclerosis. Smooth IVC contours. The portal, splenic and superior mesenteric veins are patent. No pathologically enlarged abdominal or pelvic lymph nodes. Reproductive: Status post hysterectomy. No adnexal masses. Other: No significant abdominopelvic free fluid. Musculoskeletal: No aggressive lytic or blastic lesion of bone. Multilevel degenerative change of the spine. Chronic osseous changes of the pubic symphysis. IMPRESSION: 1. No suspicious mass or lymphadenopathy in the chest, abdomen or pelvis. 2.  Large volume of formed stool in the colon compatible with constipation. 3. Cholelithiasis without findings of acute cholecystitis. 4. Gas fluid levels in a patulous esophagus, which can be seen in the setting of gastroesophageal reflux. 5.  Aortic Atherosclerosis (ICD10-I70.0). Electronically Signed   By: Maudry Mayhew M.D.   On: 12/20/2023 16:02     TODAY-DAY OF DISCHARGE:  Subjective:   Melissa Bowman today has no headache,no chest abdominal pain,no new weakness tingling or numbness, feels much better wants to go home today.   Objective:   Blood pressure 107/80, pulse 71, temperature 97.8 F (36.6 C), temperature source Oral, resp. rate 17, height 5' (1.524 m), weight 60 kg, SpO2 100%.  Intake/Output Summary (Last 24 hours) at 01/16/2024 0908 Last data filed at 01/16/2024 0400 Gross per 24 hour  Intake --  Output 450 ml  Net -450 ml   Filed Weights   01/10/24 1600 01/10/24 1722  Weight: 59.6 kg 60 kg    Exam: Awake Alert, Oriented *3, No new F.N deficits, Normal affect Rose.AT,PERRAL Supple Neck,No JVD, No cervical lymphadenopathy appriciated.  Symmetrical Chest wall movement, Good air movement bilaterally, CTAB RRR,No Gallops,Rubs or new Murmurs, No Parasternal Heave +ve B.Sounds, Abd Soft, Non tender, No organomegaly appriciated, No rebound -guarding or rigidity. No Cyanosis, Clubbing or edema, No new Rash or bruise   PERTINENT RADIOLOGIC STUDIES: No results found.   PERTINENT LAB RESULTS: CBC: No results for input(s): "WBC", "HGB", "HCT", "PLT" in the last 72 hours.  CMET CMP     Component Value Date/Time   NA 136 01/12/2024 0450   NA 133 (L) 10/21/2023 1017   K 4.1 01/12/2024 0450   CL 109 01/12/2024 0450   CO2 20 (L) 01/12/2024 0450   GLUCOSE 105 (H) 01/12/2024 0450   BUN 16 01/12/2024 0450   BUN 24 10/21/2023 1017   CREATININE 0.85 01/12/2024 0450   CREATININE 0.54 05/09/2013 1052   CALCIUM 9.1 01/12/2024 0450   PROT 6.4 (L) 01/10/2024 1738   PROT 6.2  09/12/2023 0957   ALBUMIN 3.7 01/10/2024 1738   ALBUMIN 4.4 09/12/2023 0957   AST 26 01/10/2024 1738   ALT 17 01/10/2024 1738   ALKPHOS 87 01/10/2024 1738   BILITOT 0.6 01/10/2024 1738   BILITOT 0.2 09/12/2023 0957   GFR 76.78 06/19/2021 1603   EGFR 42 (L) 10/21/2023 1017   GFRNONAA >60 01/12/2024 0450   GFRNONAA >89 05/09/2013 1052    GFR Estimated Creatinine Clearance: 47.7 mL/min (by C-G formula based on SCr of 0.85 mg/dL). No results for input(s): "LIPASE", "AMYLASE" in the last 72 hours. No results for input(s): "CKTOTAL", "CKMB", "CKMBINDEX", "TROPONINI"  in the last 72 hours. Invalid input(s): "POCBNP" No results for input(s): "DDIMER" in the last 72 hours. No results for input(s): "HGBA1C" in the last 72 hours. No results for input(s): "CHOL", "HDL", "LDLCALC", "TRIG", "CHOLHDL", "LDLDIRECT" in the last 72 hours. No results for input(s): "TSH", "T4TOTAL", "T3FREE", "THYROIDAB" in the last 72 hours.  Invalid input(s): "FREET3"  No results for input(s): "VITAMINB12", "FOLATE", "FERRITIN", "TIBC", "IRON", "RETICCTPCT" in the last 72 hours. Coags: No results for input(s): "INR" in the last 72 hours.  Invalid input(s): "PT"  Microbiology: No results found for this or any previous visit (from the past 240 hours).  FURTHER DISCHARGE INSTRUCTIONS:  Get Medicines reviewed and adjusted: Please take all your medications with you for your next visit with your Primary MD  Laboratory/radiological data: Please request your Primary MD to go over all hospital tests and procedure/radiological results at the follow up, please ask your Primary MD to get all Hospital records sent to his/her office.  In some cases, they will be blood work, cultures and biopsy results pending at the time of your discharge. Please request that your primary care M.D. goes through all the records of your hospital data and follows up on these results.  Also Note the following: If you experience worsening of  your admission symptoms, develop shortness of breath, life threatening emergency, suicidal or homicidal thoughts you must seek medical attention immediately by calling 911 or calling your MD immediately  if symptoms less severe.  You must read complete instructions/literature along with all the possible adverse reactions/side effects for all the Medicines you take and that have been prescribed to you. Take any new Medicines after you have completely understood and accpet all the possible adverse reactions/side effects.   Do not drive when taking Pain medications or sleeping medications (Benzodaizepines)  Do not take more than prescribed Pain, Sleep and Anxiety Medications. It is not advisable to combine anxiety,sleep and pain medications without talking with your primary care practitioner  Special Instructions: If you have smoked or chewed Tobacco  in the last 2 yrs please stop smoking, stop any regular Alcohol  and or any Recreational drug use.  Wear Seat belts while driving.  Please note: You were cared for by a hospitalist during your hospital stay. Once you are discharged, your primary care physician will handle any further medical issues. Please note that NO REFILLS for any discharge medications will be authorized once you are discharged, as it is imperative that you return to your primary care physician (or establish a relationship with a primary care physician if you do not have one) for your post hospital discharge needs so that they can reassess your need for medications and monitor your lab values.  Total Time spent coordinating discharge including counseling, education and face to face time equals greater than 30 minutes.  Signed: Kostas Marrow 01/16/2024 9:08 AM

## 2024-01-17 DIAGNOSIS — G9340 Encephalopathy, unspecified: Secondary | ICD-10-CM | POA: Insufficient documentation

## 2024-01-21 DIAGNOSIS — R531 Weakness: Secondary | ICD-10-CM | POA: Diagnosis not present

## 2024-01-21 DIAGNOSIS — K219 Gastro-esophageal reflux disease without esophagitis: Secondary | ICD-10-CM | POA: Diagnosis not present

## 2024-01-21 DIAGNOSIS — I10 Essential (primary) hypertension: Secondary | ICD-10-CM | POA: Diagnosis not present

## 2024-01-21 DIAGNOSIS — D649 Anemia, unspecified: Secondary | ICD-10-CM | POA: Diagnosis not present

## 2024-01-21 DIAGNOSIS — G9349 Other encephalopathy: Secondary | ICD-10-CM | POA: Diagnosis not present

## 2024-01-21 DIAGNOSIS — E7849 Other hyperlipidemia: Secondary | ICD-10-CM | POA: Diagnosis not present

## 2024-01-23 DIAGNOSIS — I10 Essential (primary) hypertension: Secondary | ICD-10-CM | POA: Diagnosis not present

## 2024-01-23 DIAGNOSIS — F01A3 Vascular dementia, mild, with mood disturbance: Secondary | ICD-10-CM | POA: Diagnosis not present

## 2024-01-23 DIAGNOSIS — T40605S Adverse effect of unspecified narcotics, sequela: Secondary | ICD-10-CM | POA: Diagnosis not present

## 2024-01-23 DIAGNOSIS — E119 Type 2 diabetes mellitus without complications: Secondary | ICD-10-CM | POA: Diagnosis not present

## 2024-01-23 DIAGNOSIS — Z4789 Encounter for other orthopedic aftercare: Secondary | ICD-10-CM | POA: Diagnosis not present

## 2024-01-23 DIAGNOSIS — D649 Anemia, unspecified: Secondary | ICD-10-CM | POA: Diagnosis not present

## 2024-01-23 DIAGNOSIS — Z9181 History of falling: Secondary | ICD-10-CM | POA: Diagnosis not present

## 2024-01-23 DIAGNOSIS — S42232D 3-part fracture of surgical neck of left humerus, subsequent encounter for fracture with routine healing: Secondary | ICD-10-CM | POA: Diagnosis not present

## 2024-01-23 DIAGNOSIS — G9340 Encephalopathy, unspecified: Secondary | ICD-10-CM | POA: Diagnosis not present

## 2024-01-23 DIAGNOSIS — Z79899 Other long term (current) drug therapy: Secondary | ICD-10-CM | POA: Diagnosis not present

## 2024-01-23 DIAGNOSIS — K219 Gastro-esophageal reflux disease without esophagitis: Secondary | ICD-10-CM | POA: Diagnosis not present

## 2024-01-23 DIAGNOSIS — T48205S Adverse effect of unspecified drugs acting on muscles, sequela: Secondary | ICD-10-CM | POA: Diagnosis not present

## 2024-01-23 DIAGNOSIS — J4520 Mild intermittent asthma, uncomplicated: Secondary | ICD-10-CM | POA: Diagnosis not present

## 2024-01-23 DIAGNOSIS — S42302D Unspecified fracture of shaft of humerus, left arm, subsequent encounter for fracture with routine healing: Secondary | ICD-10-CM | POA: Diagnosis not present

## 2024-01-23 DIAGNOSIS — G928 Other toxic encephalopathy: Secondary | ICD-10-CM | POA: Diagnosis not present

## 2024-01-23 DIAGNOSIS — M6281 Muscle weakness (generalized): Secondary | ICD-10-CM | POA: Diagnosis not present

## 2024-01-25 ENCOUNTER — Telehealth: Payer: Self-pay | Admitting: Family Medicine

## 2024-01-25 NOTE — Telephone Encounter (Signed)
PT changed request to 1 x week x 5 weeks, verbal approval given

## 2024-01-25 NOTE — Telephone Encounter (Unsigned)
Copied from CRM [id number]. Topic: Clinical - Home Health Verbal Orders >> Jan 24, 2024  4:42 PM Higinio Roger wrote: Caller/Agency: [facility]  Callback Number: [phone]  Service Requested: Physical Therapy  Frequency: request for 2x for 2 weeks and 1x for four weeks after to work on gait, balance, and strengthing.  Any new concerns about the patient? No

## 2024-01-26 DIAGNOSIS — T40605S Adverse effect of unspecified narcotics, sequela: Secondary | ICD-10-CM | POA: Diagnosis not present

## 2024-01-26 DIAGNOSIS — I10 Essential (primary) hypertension: Secondary | ICD-10-CM | POA: Diagnosis not present

## 2024-01-26 DIAGNOSIS — J4520 Mild intermittent asthma, uncomplicated: Secondary | ICD-10-CM | POA: Diagnosis not present

## 2024-01-26 DIAGNOSIS — S42232D 3-part fracture of surgical neck of left humerus, subsequent encounter for fracture with routine healing: Secondary | ICD-10-CM | POA: Diagnosis not present

## 2024-01-26 DIAGNOSIS — Z4789 Encounter for other orthopedic aftercare: Secondary | ICD-10-CM | POA: Diagnosis not present

## 2024-01-26 DIAGNOSIS — K219 Gastro-esophageal reflux disease without esophagitis: Secondary | ICD-10-CM | POA: Diagnosis not present

## 2024-01-26 DIAGNOSIS — F01A3 Vascular dementia, mild, with mood disturbance: Secondary | ICD-10-CM | POA: Diagnosis not present

## 2024-01-26 DIAGNOSIS — G9340 Encephalopathy, unspecified: Secondary | ICD-10-CM | POA: Diagnosis not present

## 2024-01-26 DIAGNOSIS — T48205S Adverse effect of unspecified drugs acting on muscles, sequela: Secondary | ICD-10-CM | POA: Diagnosis not present

## 2024-01-26 DIAGNOSIS — E119 Type 2 diabetes mellitus without complications: Secondary | ICD-10-CM | POA: Diagnosis not present

## 2024-01-26 DIAGNOSIS — S42302D Unspecified fracture of shaft of humerus, left arm, subsequent encounter for fracture with routine healing: Secondary | ICD-10-CM | POA: Diagnosis not present

## 2024-01-26 DIAGNOSIS — M6281 Muscle weakness (generalized): Secondary | ICD-10-CM | POA: Diagnosis not present

## 2024-01-26 DIAGNOSIS — G928 Other toxic encephalopathy: Secondary | ICD-10-CM | POA: Diagnosis not present

## 2024-01-26 DIAGNOSIS — D649 Anemia, unspecified: Secondary | ICD-10-CM | POA: Diagnosis not present

## 2024-01-26 DIAGNOSIS — Z79899 Other long term (current) drug therapy: Secondary | ICD-10-CM | POA: Diagnosis not present

## 2024-01-26 DIAGNOSIS — Z9181 History of falling: Secondary | ICD-10-CM | POA: Diagnosis not present

## 2024-01-27 ENCOUNTER — Ambulatory Visit (INDEPENDENT_AMBULATORY_CARE_PROVIDER_SITE_OTHER): Payer: Medicare HMO | Admitting: Family Medicine

## 2024-01-27 ENCOUNTER — Encounter: Payer: Self-pay | Admitting: Family Medicine

## 2024-01-27 VITALS — BP 150/68 | HR 61 | Temp 98.4°F | Ht 60.0 in | Wt 131.8 lb

## 2024-01-27 DIAGNOSIS — G9340 Encephalopathy, unspecified: Secondary | ICD-10-CM | POA: Diagnosis not present

## 2024-01-27 DIAGNOSIS — T48205S Adverse effect of unspecified drugs acting on muscles, sequela: Secondary | ICD-10-CM | POA: Diagnosis not present

## 2024-01-27 DIAGNOSIS — R410 Disorientation, unspecified: Secondary | ICD-10-CM | POA: Diagnosis not present

## 2024-01-27 DIAGNOSIS — G928 Other toxic encephalopathy: Secondary | ICD-10-CM | POA: Diagnosis not present

## 2024-01-27 DIAGNOSIS — Z9181 History of falling: Secondary | ICD-10-CM | POA: Diagnosis not present

## 2024-01-27 DIAGNOSIS — Z4789 Encounter for other orthopedic aftercare: Secondary | ICD-10-CM | POA: Diagnosis not present

## 2024-01-27 DIAGNOSIS — J4520 Mild intermittent asthma, uncomplicated: Secondary | ICD-10-CM | POA: Diagnosis not present

## 2024-01-27 DIAGNOSIS — Z79899 Other long term (current) drug therapy: Secondary | ICD-10-CM | POA: Diagnosis not present

## 2024-01-27 DIAGNOSIS — S42302D Unspecified fracture of shaft of humerus, left arm, subsequent encounter for fracture with routine healing: Secondary | ICD-10-CM | POA: Diagnosis not present

## 2024-01-27 DIAGNOSIS — Z09 Encounter for follow-up examination after completed treatment for conditions other than malignant neoplasm: Secondary | ICD-10-CM | POA: Diagnosis not present

## 2024-01-27 DIAGNOSIS — F01A3 Vascular dementia, mild, with mood disturbance: Secondary | ICD-10-CM | POA: Diagnosis not present

## 2024-01-27 DIAGNOSIS — D649 Anemia, unspecified: Secondary | ICD-10-CM | POA: Diagnosis not present

## 2024-01-27 DIAGNOSIS — K219 Gastro-esophageal reflux disease without esophagitis: Secondary | ICD-10-CM | POA: Diagnosis not present

## 2024-01-27 DIAGNOSIS — M6281 Muscle weakness (generalized): Secondary | ICD-10-CM | POA: Diagnosis not present

## 2024-01-27 DIAGNOSIS — S42232D 3-part fracture of surgical neck of left humerus, subsequent encounter for fracture with routine healing: Secondary | ICD-10-CM | POA: Diagnosis not present

## 2024-01-27 DIAGNOSIS — T40605S Adverse effect of unspecified narcotics, sequela: Secondary | ICD-10-CM | POA: Diagnosis not present

## 2024-01-27 DIAGNOSIS — I10 Essential (primary) hypertension: Secondary | ICD-10-CM | POA: Diagnosis not present

## 2024-01-27 DIAGNOSIS — E119 Type 2 diabetes mellitus without complications: Secondary | ICD-10-CM | POA: Diagnosis not present

## 2024-01-27 NOTE — Progress Notes (Signed)
Subjective: [name]/ nursing facility discharge PCP: Melissa Bowman, Melissa Bowman is a 73 y.o. female presenting to clinic today for:  1. ORIF/ AMS Admitted for ORIF in January. Shortly thereafter she had AMS that was thought to be secondary to use of muscle relaxant.  This resolved and she was discharged to rehab facility at [facility].  She was discharged with OT/PT outpatient.  At discharge she was doing well and responding to physical therapy appropriately. Her husband comments he does not believe the Baclofen to have been the etiology of her symptoms.  She is doing better now at home. Cooking and driving again.  Has been seen by SLP and released. PT/OT still haven't come yet.  She reports some ongoing weakness of LEs but this is getting better too.  ROS: Per HPI  Allergies  Allergen Reactions   Statins Other (See Comments)    myalgias   E-Mycin [Erythromycin] Other (See Comments)    Medication intolerance stomach cramps   Estrogens Other (See Comments)    Estrogen replacement Therapy intolerance mood changes   Sulfa Antibiotics Other (See Comments)    Causes severe stomach cramps    Vioxx [Rofecoxib] Other (See Comments)    Unknown reaction/interaction. 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 pylori infection 11/16/1993   Hiatal hernia    History of benign adrenal tumor 2007   [facility]   History of pneumonia 2007, 2008   Hyperlipidemia    Hypertension    IBS (irritable bowel syndrome)    Internal hemorrhoids    Kyphosis    Long term current use of antipsychotic medication 08/19/2022   Memory loss    Menopause    Metabolic syndrome    OSA on CPAP    Prolonged Q-T interval on ECG  08/19/2022   Type 2 diabetes mellitus (HCC)    Vitamin D deficiency     Current Outpatient Medications:    Ascorbic Acid (VITAMIN C) 1000 MG tablet, Take 1,000 mg by mouth in the morning., Disp: , Rfl:    CALCIUM PO, Take 1,200 mg by mouth in the morning., Disp: , Rfl:    Carboxymethylcellulose Sodium 0.25 % SOLN, Place 1-2 drops into both eyes as needed (dry eyes)., Disp: , Rfl:    Cholecalciferol (VITAMIN D-3) 5000 UNITS TABS, Take 5,000 Units by mouth in the morning., Disp: , Rfl:    desloratadine (CLARINEX) 5 MG tablet, Take 5 mg by mouth in the morning., Disp: , Rfl:    famotidine (PEPCID) 20 MG tablet, Take 1 tablet (20 mg total) by mouth at bedtime. (Patient taking differently: Take 20 mg by mouth 2 (two) times daily.), Disp: 90 tablet, Rfl: 1   fenofibrate 160 MG tablet, TAKE 1 TABLET BY MOUTH EVERY DAY, Disp: 90 tablet, Rfl: 1   glycerin adult 2 g suppository, Place 1 suppository rectally as needed for constipation., Disp: , Rfl:    ibuprofen (ADVIL) 200 MG tablet, Take 200-400 mg by mouth every 8 (eight) hours as needed (pain.)., Disp: , Rfl:    irbesartan-hydrochlorothiazide (AVALIDE) 150-12.5 MG tablet, TAKE 1 TABLET BY MOUTH EVERY DAY, Disp: 100 tablet, Rfl: 0  melatonin 10 MG TABS, Take 10 mg by mouth at bedtime., Disp: , Rfl:    memantine (NAMENDA) 5 MG tablet, Take 1 tablet (5 mg total) by mouth 2 (two) times daily., Disp: 200 tablet, Rfl: 3   mirtazapine (REMERON) 45 MG tablet, Take 1 tablet (45 mg total) by mouth at bedtime., Disp: 90 tablet, Rfl: 0   montelukast (SINGULAIR) 10 MG tablet, Take 1 tablet (10 mg total) by mouth daily. (Patient taking differently: Take 10 mg by mouth at bedtime.), Disp: 100 tablet, Rfl: 3   ondansetron (ZOFRAN-ODT) 4 MG disintegrating tablet, Take 1 tablet (4 mg total) by mouth every 8 (eight) hours as needed for nausea or vomiting., Disp: 20 tablet, Rfl: 0   pantoprazole (PROTONIX) 40 MG tablet, Take 1 tablet (40 mg total) by mouth 2 (two) times  daily before a meal., Disp: 60 tablet, Rfl: 1   PROAIR HFA 108 (90 BASE) MCG/ACT inhaler, Use 2 puffs into the lungs  every 6 hours as needed for wheezing or shortness of  breath (Patient taking differently: Inhale 2 puffs into the lungs every 6 (six) hours as needed for wheezing or shortness of breath.), Disp: 25.5 g, Rfl: 2   sertraline (ZOLOFT) 50 MG tablet, Take 1 tablet (50 mg total) by mouth daily., Disp: 90 tablet, Rfl: 2 Social History   Socioeconomic History   Marital status: Married    Spouse name: Melissa Bowman   Number of children: 2   Years of education: some college   Highest education level: Some college, no degree  Occupational History   Occupation: retired    Comment: daycare  Tobacco Use   Smoking status: Never   Smokeless tobacco: Never  Vaping Use   Vaping status: Never Used  Substance and Sexual Activity   Alcohol use: No   Drug use: No   Sexual activity: Not on file  Other Topics Concern   Not on file  Social History Narrative   Melissa Bowman is retired. She lives with her husband Melissa Bowman. They have two grown daughters that live locally. Amneet enjoys reading and puzzles.       Social Drivers of Corporate investment banker Strain: Low Risk  (04/25/2023)   Overall Financial Resource Strain (CARDIA)    Difficulty of Paying Living Expenses: Not hard at all  Food Insecurity: Patient Declined (01/11/2024)   Hunger Vital Sign    Worried About Running Out of Food in the Last Year: Patient declined    Ran Out of Food in the Last Year: Patient declined  Transportation Needs: No Transportation Needs (01/20/2024)   Received from [facility] - Transportation    Lack of Transportation (Medical): No    Lack of Transportation (Non-Medical): No  Physical Activity: Insufficiently Active (04/25/2023)   Exercise Vital Sign    Days of Exercise per Week: 3 days    Minutes of Exercise per Session: 30 min  Stress: No Stress Concern Present (04/25/2023)   [employer] of  Occupational Health - Occupational Stress Questionnaire    Feeling of Stress : Not at all  Social Connections: Unknown (01/11/2024)   Social Connection and Isolation Panel [NHANES]    Frequency of Communication with Friends and Family: Patient declined    Frequency of Social Gatherings with Friends and Family: Patient declined    Attends Religious Services: Never    Database administrator or Organizations: Patient declined    Attends Banker Meetings: Patient declined    Marital  Status: Patient declined  Intimate Partner Violence: Unknown (01/11/2024)   Humiliation, Afraid, Rape, and Kick questionnaire    Fear of Current or Ex-Partner: Patient declined    Emotionally Abused: Patient declined    Physically Abused: Patient declined    Sexually Abused: Not on file   Family History  Problem Relation Age of Onset   Heart disease Mother    Parkinsonism Father    Emphysema Father    Hip fracture Father    Heart failure Sister    Ovarian cancer Sister    COPD Sister    Lymphoma Sister    COPD Sister    COPD Brother    Hypertension Brother    Heart disease Brother    COPD Brother    Colon cancer Neg Hx    Esophageal cancer Neg Hx    Pancreatic cancer Neg Hx    Stomach cancer Neg Hx    Breast cancer Neg Hx     Objective: Office vital signs reviewed. BP (!) 150/68   Pulse 61   Temp 98.4 F (36.9 C)   Ht 5' (1.524 m)   Wt 131 lb 12.8 oz (59.8 kg)   SpO2 99%   BMI 25.74 kg/m   Physical Examination:  General: Awake, alert, nontoxic female. Jaw tremor present. No acute distress HEENT: sclera white, MMM Cardio: regular rate and rhythm, S1S2 heard, + murmurs appreciated Pulm: clear to auscultation bilaterally, no wheezes, rhonchi or rales; normal work of breathing on room air Neuro: follows commands  Assessment/ Plan: 73 y.o. female   Delirium  Hospital discharge follow-up  Resolved. Keep PT/OT eval/ follow up.  See me back in the next 3 months for recheck  of blood pressure as this was elevated today and borderline.  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

## 2024-01-30 ENCOUNTER — Telehealth: Payer: Self-pay

## 2024-01-30 NOTE — Telephone Encounter (Signed)
 Copied from CRM [id number]. Topic: General - Other >> Jan 30, 2024  3:10 PM Zayanah H wrote: Reason for CRM: no further home health is needed, home health nurse does advised once physical therapy is over that she is seen for out patient care for her arm

## 2024-02-02 DIAGNOSIS — M6281 Muscle weakness (generalized): Secondary | ICD-10-CM | POA: Diagnosis not present

## 2024-02-02 DIAGNOSIS — G9340 Encephalopathy, unspecified: Secondary | ICD-10-CM | POA: Diagnosis not present

## 2024-02-02 DIAGNOSIS — D649 Anemia, unspecified: Secondary | ICD-10-CM | POA: Diagnosis not present

## 2024-02-02 DIAGNOSIS — Z9181 History of falling: Secondary | ICD-10-CM | POA: Diagnosis not present

## 2024-02-02 DIAGNOSIS — Z4789 Encounter for other orthopedic aftercare: Secondary | ICD-10-CM | POA: Diagnosis not present

## 2024-02-02 DIAGNOSIS — T40605S Adverse effect of unspecified narcotics, sequela: Secondary | ICD-10-CM | POA: Diagnosis not present

## 2024-02-02 DIAGNOSIS — J4520 Mild intermittent asthma, uncomplicated: Secondary | ICD-10-CM | POA: Diagnosis not present

## 2024-02-02 DIAGNOSIS — F01A3 Vascular dementia, mild, with mood disturbance: Secondary | ICD-10-CM | POA: Diagnosis not present

## 2024-02-02 DIAGNOSIS — T48205S Adverse effect of unspecified drugs acting on muscles, sequela: Secondary | ICD-10-CM | POA: Diagnosis not present

## 2024-02-02 DIAGNOSIS — E119 Type 2 diabetes mellitus without complications: Secondary | ICD-10-CM | POA: Diagnosis not present

## 2024-02-02 DIAGNOSIS — I10 Essential (primary) hypertension: Secondary | ICD-10-CM | POA: Diagnosis not present

## 2024-02-02 DIAGNOSIS — K219 Gastro-esophageal reflux disease without esophagitis: Secondary | ICD-10-CM | POA: Diagnosis not present

## 2024-02-02 DIAGNOSIS — G928 Other toxic encephalopathy: Secondary | ICD-10-CM | POA: Diagnosis not present

## 2024-02-02 DIAGNOSIS — S42302D Unspecified fracture of shaft of humerus, left arm, subsequent encounter for fracture with routine healing: Secondary | ICD-10-CM | POA: Diagnosis not present

## 2024-02-02 DIAGNOSIS — Z79899 Other long term (current) drug therapy: Secondary | ICD-10-CM | POA: Diagnosis not present

## 2024-02-02 DIAGNOSIS — S42232D 3-part fracture of surgical neck of left humerus, subsequent encounter for fracture with routine healing: Secondary | ICD-10-CM | POA: Diagnosis not present

## 2024-02-03 ENCOUNTER — Ambulatory Visit: Payer: Medicare HMO

## 2024-02-03 DIAGNOSIS — D649 Anemia, unspecified: Secondary | ICD-10-CM

## 2024-02-03 DIAGNOSIS — S42232D 3-part fracture of surgical neck of left humerus, subsequent encounter for fracture with routine healing: Secondary | ICD-10-CM | POA: Diagnosis not present

## 2024-02-03 DIAGNOSIS — I10 Essential (primary) hypertension: Secondary | ICD-10-CM | POA: Diagnosis not present

## 2024-02-03 DIAGNOSIS — S42302D Unspecified fracture of shaft of humerus, left arm, subsequent encounter for fracture with routine healing: Secondary | ICD-10-CM | POA: Diagnosis not present

## 2024-02-03 DIAGNOSIS — E119 Type 2 diabetes mellitus without complications: Secondary | ICD-10-CM

## 2024-02-03 DIAGNOSIS — F01A3 Vascular dementia, mild, with mood disturbance: Secondary | ICD-10-CM | POA: Diagnosis not present

## 2024-02-03 DIAGNOSIS — G928 Other toxic encephalopathy: Secondary | ICD-10-CM | POA: Diagnosis not present

## 2024-02-03 DIAGNOSIS — K219 Gastro-esophageal reflux disease without esophagitis: Secondary | ICD-10-CM | POA: Diagnosis not present

## 2024-02-03 DIAGNOSIS — T48205S Adverse effect of unspecified drugs acting on muscles, sequela: Secondary | ICD-10-CM | POA: Diagnosis not present

## 2024-02-03 DIAGNOSIS — T40605S Adverse effect of unspecified narcotics, sequela: Secondary | ICD-10-CM | POA: Diagnosis not present

## 2024-02-03 DIAGNOSIS — J4520 Mild intermittent asthma, uncomplicated: Secondary | ICD-10-CM

## 2024-02-03 DIAGNOSIS — Z79899 Other long term (current) drug therapy: Secondary | ICD-10-CM

## 2024-02-07 ENCOUNTER — Other Ambulatory Visit (HOSPITAL_COMMUNITY): Payer: Self-pay | Admitting: Psychiatry

## 2024-02-07 DIAGNOSIS — F330 Major depressive disorder, recurrent, mild: Secondary | ICD-10-CM

## 2024-02-08 DIAGNOSIS — Z79899 Other long term (current) drug therapy: Secondary | ICD-10-CM | POA: Diagnosis not present

## 2024-02-08 DIAGNOSIS — E119 Type 2 diabetes mellitus without complications: Secondary | ICD-10-CM | POA: Diagnosis not present

## 2024-02-08 DIAGNOSIS — D649 Anemia, unspecified: Secondary | ICD-10-CM | POA: Diagnosis not present

## 2024-02-08 DIAGNOSIS — G928 Other toxic encephalopathy: Secondary | ICD-10-CM | POA: Diagnosis not present

## 2024-02-08 DIAGNOSIS — I10 Essential (primary) hypertension: Secondary | ICD-10-CM | POA: Diagnosis not present

## 2024-02-08 DIAGNOSIS — J4520 Mild intermittent asthma, uncomplicated: Secondary | ICD-10-CM | POA: Diagnosis not present

## 2024-02-08 DIAGNOSIS — T48205S Adverse effect of unspecified drugs acting on muscles, sequela: Secondary | ICD-10-CM | POA: Diagnosis not present

## 2024-02-08 DIAGNOSIS — S42302D Unspecified fracture of shaft of humerus, left arm, subsequent encounter for fracture with routine healing: Secondary | ICD-10-CM | POA: Diagnosis not present

## 2024-02-08 DIAGNOSIS — Z4789 Encounter for other orthopedic aftercare: Secondary | ICD-10-CM | POA: Diagnosis not present

## 2024-02-08 DIAGNOSIS — T40605S Adverse effect of unspecified narcotics, sequela: Secondary | ICD-10-CM | POA: Diagnosis not present

## 2024-02-08 DIAGNOSIS — S42232D 3-part fracture of surgical neck of left humerus, subsequent encounter for fracture with routine healing: Secondary | ICD-10-CM | POA: Diagnosis not present

## 2024-02-08 DIAGNOSIS — Z9181 History of falling: Secondary | ICD-10-CM | POA: Diagnosis not present

## 2024-02-08 DIAGNOSIS — F01A3 Vascular dementia, mild, with mood disturbance: Secondary | ICD-10-CM | POA: Diagnosis not present

## 2024-02-08 DIAGNOSIS — K219 Gastro-esophageal reflux disease without esophagitis: Secondary | ICD-10-CM | POA: Diagnosis not present

## 2024-02-08 DIAGNOSIS — M6281 Muscle weakness (generalized): Secondary | ICD-10-CM | POA: Diagnosis not present

## 2024-02-08 DIAGNOSIS — G9340 Encephalopathy, unspecified: Secondary | ICD-10-CM | POA: Diagnosis not present

## 2024-02-15 DIAGNOSIS — S42232D 3-part fracture of surgical neck of left humerus, subsequent encounter for fracture with routine healing: Secondary | ICD-10-CM | POA: Diagnosis not present

## 2024-02-15 DIAGNOSIS — Z79899 Other long term (current) drug therapy: Secondary | ICD-10-CM | POA: Diagnosis not present

## 2024-02-15 DIAGNOSIS — F01A3 Vascular dementia, mild, with mood disturbance: Secondary | ICD-10-CM | POA: Diagnosis not present

## 2024-02-15 DIAGNOSIS — G928 Other toxic encephalopathy: Secondary | ICD-10-CM | POA: Diagnosis not present

## 2024-02-15 DIAGNOSIS — J4520 Mild intermittent asthma, uncomplicated: Secondary | ICD-10-CM | POA: Diagnosis not present

## 2024-02-15 DIAGNOSIS — S42302D Unspecified fracture of shaft of humerus, left arm, subsequent encounter for fracture with routine healing: Secondary | ICD-10-CM | POA: Diagnosis not present

## 2024-02-15 DIAGNOSIS — T48205S Adverse effect of unspecified drugs acting on muscles, sequela: Secondary | ICD-10-CM | POA: Diagnosis not present

## 2024-02-15 DIAGNOSIS — T40605S Adverse effect of unspecified narcotics, sequela: Secondary | ICD-10-CM | POA: Diagnosis not present

## 2024-02-15 DIAGNOSIS — K219 Gastro-esophageal reflux disease without esophagitis: Secondary | ICD-10-CM | POA: Diagnosis not present

## 2024-02-15 DIAGNOSIS — G9340 Encephalopathy, unspecified: Secondary | ICD-10-CM | POA: Diagnosis not present

## 2024-02-15 DIAGNOSIS — M6281 Muscle weakness (generalized): Secondary | ICD-10-CM | POA: Diagnosis not present

## 2024-02-15 DIAGNOSIS — Z9181 History of falling: Secondary | ICD-10-CM | POA: Diagnosis not present

## 2024-02-15 DIAGNOSIS — Z4789 Encounter for other orthopedic aftercare: Secondary | ICD-10-CM | POA: Diagnosis not present

## 2024-02-15 DIAGNOSIS — I10 Essential (primary) hypertension: Secondary | ICD-10-CM | POA: Diagnosis not present

## 2024-02-15 DIAGNOSIS — E119 Type 2 diabetes mellitus without complications: Secondary | ICD-10-CM | POA: Diagnosis not present

## 2024-02-15 DIAGNOSIS — D649 Anemia, unspecified: Secondary | ICD-10-CM | POA: Diagnosis not present

## 2024-02-17 ENCOUNTER — Telehealth (HOSPITAL_COMMUNITY): Payer: Medicare HMO | Admitting: Psychiatry

## 2024-02-17 ENCOUNTER — Encounter (HOSPITAL_COMMUNITY): Payer: Self-pay | Admitting: Psychiatry

## 2024-02-17 DIAGNOSIS — G4700 Insomnia, unspecified: Secondary | ICD-10-CM | POA: Diagnosis not present

## 2024-02-17 DIAGNOSIS — F3342 Major depressive disorder, recurrent, in full remission: Secondary | ICD-10-CM

## 2024-02-17 DIAGNOSIS — G3184 Mild cognitive impairment, so stated: Secondary | ICD-10-CM | POA: Diagnosis not present

## 2024-02-17 DIAGNOSIS — T43505A Adverse effect of unspecified antipsychotics and neuroleptics, initial encounter: Secondary | ICD-10-CM

## 2024-02-17 DIAGNOSIS — G2401 Drug induced subacute dyskinesia: Secondary | ICD-10-CM | POA: Diagnosis not present

## 2024-02-17 MED ORDER — MIRTAZAPINE 45 MG PO TABS: 45.0000 mg | ORAL_TABLET | Freq: Every day | ORAL | 2 refills | Status: DC

## 2024-02-17 NOTE — Patient Instructions (Addendum)
 We did not make any medication changes today.  When you see your neurologist next ask about VMAT2 inhibitors like valbenazine (Ingrezza) which can be very helpful in the treatment of tardive dyskinesia.  As we discussed however, if this is not bothersome to Melissa Bowman then you may not need to start it.

## 2024-02-17 NOTE — Progress Notes (Signed)
 BH MD Outpatient Progress Note  02/17/2024 9:53 AM Melissa Bowman  MRN:  [id number]  Assessment:  Melissa Bowman presents for follow-up evaluation. Today, 02/17/24, patient reports significantly improved mood with combination of Zoloft at low dose and Remeron and would consider depression in full remission at this point.  Remeron has remained effective for insomnia but after losing her sense of taste in March of last year from COVID she has lost a significant amount of weight and is down to 128 pounds.  Barium swallow was ultimately within normal limits and she reports being able to eat an adequate amount of food during the day so not entirely sure what could be causing the weight loss at this point. Of note with hearing aids she was the most participatory in interview today and was able to remember all of her medications as well as events surrounding her fall since last appointment.  The hearing aids will likely help preserve her cognition along with addition of memantine and she has been consistently taking.  Unfortunately, previous neurology referral confirmed tardive dyskinesia diagnosis but did not want to start VMAT 2 inhibitor; noted today with worsening lip and tongue movements and encouraged them to follow up with neurology about this. Follow up in 2 months.  For safety, her acute risk factors for suicide are: cognitive impairment.  Her chronic risk factors for suicide are: Retired, chronic mental illness.  Her protective factors are: Supportive family and friends, beloved pets, grandchildren that are frequently in the home, actively seeking and engaging with mental health care, no suicidal ideation.  Future events cannot be fully predicted she does not currently meet IVC criteria and can be continued as an outpatient.  Identifying Information: Melissa Bowman is a 73 y.o. female with a history of mild neurocognitive disorder, major depressive disorder, anxiety, insomnia, neuroleptic tardive  dyskinesia, bradycardia with prolonged Qtc, and central sleep apnea  who is an established patient with [facility] participating in follow-up via video conferencing. Initial evaluation on 08/19/22, see that note for full case formulation. Amitriptyline discontinued due to possible drug interaction with her heart. Her CPAP was adjusted in March by her provider and her husband fine tunes at home. Trials of belsombra and ramelteon proved ineffective. Mirtazapine was next medication trial. Discontinued patient's abilify as her depression appears to be in remission outside of the symptoms of insomnia and some decreased appetite. She was noted to have neuroleptic induced tardive dyskinesia. Also had Qtc prolongation along with sinus bradycardia on independent review of ecg from 06/2022 which was another reason to discontinue the abilify as outlined in plan. Had difficulty hearing vs some delay from abilify use vs early stages of a neurocognitive disorder. Increased concern for the latter due to patient's age, prolonged use of amitriptyline which has a higher anticholinergic load, and frequent checking with husband for answers to some questions during interview. MOCA done with PCPs office after that visit showing MoCA score of 24 on recent examination on 09/10/2022 and patient was prescribed memantine but never picked it up from the pharmacy as of 12/28/2022.    Plan:   # Mild neurocognitive disorder  diminished hearing Past medication trials: memantine, hearing aids Status of problem: Well-controlled Interventions: -- Continue memantine 5mg  bid (s1/16/24, i2/23/24) -- Continue with hearing aids  # Insomnia  Central sleep apnea Past medication trials: amitriptyline (effective), belsombra, ramelteon Status of problem: Well-controlled Interventions: -- continue mirtazapine 45mg  once nightly (i10/11/23, i11/10/23, i1/16/24) -- CMP is up to  date with normal sodium --Continue CPAP   #  Major depressive disorder, recurrent, in full remission Past medication trials: abilify, lexapro, amitriptyline. 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   Subjective:  Chief Complaint:  Chief Complaint  Patient presents with   Memory Loss   Follow-up    Interval History: Melissa Bowman hasn't been too good. Had a fall and broke her arm requiring surgery. Had to stay in the hospital for 7 days and was delirious. Doing a lot better now and feels great. Was her left arm close to her shoulder. Thinks the CAT scan she got prior to the fall that led to it. Had 5 days of therapy after that for rehab. She is doing 500 word searches in about 2-3 days. Feels the best she is has been in a long time. After losing her taste last March has lost 70lbs and is down to 128lbs; is eating a good breakfast and snacks until dinner. Main issue is struggling with some textures of meat. Feels like she has more energy with the weight loss. Sleeps better with the remeron, getting 8-9hrs. Hearing is a lot better know with the hearing aids. The extra movements around her mouth and feet don't bother her but do bother [name] who adds they happen constantly. They will see neurology later this month. She is in agreement that her memory is working great now. Not noting any side effects at present.  Visit Diagnosis:    ICD-10-CM   1. Mild cognitive impairment  G31.84     2. Recurrent major depressive disorder, in full remission (HCC)  F33.42 mirtazapine (REMERON) 45 MG tablet    3. Insomnia, unspecified type   G47.00 mirtazapine (REMERON) 45 MG tablet    4. Neuroleptic-induced tardive dyskinesia  G24.01    T43.505A         Past Psychiatric History:  Diagnoses: mild neurocognitive disorder, major depressive disorder, anxiety, insomnia, neuroleptic tardive dyskinesia Medication trials: abilify, lexapro, amitriptyline, belsombra, melatonin, remeron (currently effective), Zoloft (effective). Others she cannot remember Previous psychiatrist/therapist: biofeedback many years ago Hospitalizations: none Suicide attempts: none SIB: none Hx of violence towards others: none Current access to guns: will assess at future visit Hx of abuse: none Substance use: none  Past Medical History:  Past Medical History:  Diagnosis Date   Allergic rhinitis    Allergy to ertapenem    Anxiety 08/27/2011   Anxiety and depression    Asthmatic bronchitis    Bilateral carpal tunnel syndrome    Condyloma acuminata    Diverticulosis    Eczema    GERD (gastroesophageal reflux disease)    H. pylori infection 11/16/1993   Hiatal hernia    History of benign adrenal tumor 2007   [facility]   History of pneumonia 2007, 2008   Hyperlipidemia    Hypertension    IBS (irritable bowel syndrome)    Internal hemorrhoids    Kyphosis    Long term current use of antipsychotic medication 08/19/2022   Memory loss    Menopause    Metabolic syndrome    OSA on CPAP    Prolonged Q-T interval on ECG 08/19/2022  Type 2 diabetes mellitus (HCC)    Vitamin D deficiency     Past Surgical History:  Procedure Laterality Date   abdominal wall lematoma of fabdd hys.     APPENDECTOMY     CARPAL TUNNEL RELEASE     CESAREAN SECTION     X2   COLONOSCOPY     INCISIONAL HERNIA REPAIR     ORIF HUMERUS FRACTURE Left 12/26/2023   Procedure: OPEN REDUCTION INTERNAL FIXATION (ORIF) HUMERUS FRACTURE;  Surgeon: Yolonda Kida, MD;  Location: [facility] OR;  Service: Orthopedics;  Laterality: Left;  90   right breast biopsy  09/1995   ROTATOR  CUFF REPAIR  2002   right   ROTATOR CUFF REPAIR/ bone spur  08/30/2009   SHOULDER ARTHROSCOPY WITH ROTATOR CUFF REPAIR AND SUBACROMIAL DECOMPRESSION Right 01/19/2018   Procedure: REVISION RIGHT MINI OPEN ROTATOR CUFF REPAIR AND SUBACROMIAL DECOMPRESSION;  Surgeon: Jene Every, MD;  Location: [facility] ORS;  Service: Orthopedics;  Laterality: Right;  90 MINS   sleep apnea surgery  04/1995   torn cartiledge rt knee x2  05/13/2006   Dr. Sherlean Foot Smoc    TOTAL ABDOMINAL HYSTERECTOMY W/ BILATERAL SALPINGOOPHORECTOMY  07/1993   fibroids     Family Psychiatric History: both daughters with depression  Family History:  Family History  Problem Relation Age of Onset   Heart disease Mother    Parkinsonism Father    Emphysema Father    Hip fracture Father    Heart failure Sister    Ovarian cancer Sister    COPD Sister    Lymphoma Sister    COPD Sister    COPD Brother    Hypertension Brother    Heart disease Brother    COPD Brother    Colon cancer Neg Hx    Esophageal cancer Neg Hx    Pancreatic cancer Neg Hx    Stomach cancer Neg Hx    Breast cancer Neg Hx     Social History:  Social History   Socioeconomic History   Marital status: Married    Spouse name: [name]   Number of children: 2   Years of education: some college   Highest education level: Some college, no degree  Occupational History   Occupation: retired    Comment: daycare  Tobacco Use   Smoking status: Never   Smokeless tobacco: Never  Vaping Use   Vaping status: Never Used  Substance and Sexual Activity   Alcohol use: No   Drug use: No   Sexual activity: Not on file  Other Topics Concern   Not on file  Social History Narrative   Shelley is retired. She lives with her husband [name]. They have two grown daughters that live locally. Kendel enjoys reading and puzzles.       Social Drivers of Corporate investment banker Strain: Low Risk  (04/25/2023)   Overall Financial Resource Strain (CARDIA)    Difficulty of  Paying Living Expenses: Not hard at all  Food Insecurity: Patient Declined (01/11/2024)   Hunger Vital Sign    Worried About Running Out of Food in the Last Year: Patient declined    Ran Out of Food in the Last Year: Patient declined  Transportation Needs: No Transportation Needs (01/20/2024)   Received from [facility] - Transportation    Lack of Transportation (Medical): No    Lack of Transportation (Non-Medical): No  Physical Activity: Insufficiently Active (04/25/2023)   Exercise Vital Sign    Days  of Exercise per Week: 3 days    Minutes of Exercise per Session: 30 min  Stress: No Stress Concern Present (04/25/2023)   [employer] of Occupational Health - Occupational Stress Questionnaire    Feeling of Stress : Not at all  Social Connections: Unknown (01/11/2024)   Social Connection and Isolation Panel [NHANES]    Frequency of Communication with Friends and Family: Patient declined    Frequency of Social Gatherings with Friends and Family: Patient declined    Attends Religious Services: Never    Database administrator or Organizations: Patient declined    Attends Banker Meetings: Patient declined    Marital Status: Patient declined    Allergies:  Allergies  Allergen Reactions   Statins Other (See Comments)    myalgias   E-Mycin [Erythromycin] Other (See Comments)    Medication intolerance stomach cramps   Estrogens Other (See Comments)    Estrogen replacement Therapy intolerance mood changes   Sulfa Antibiotics Other (See Comments)    Causes severe stomach cramps    Vioxx [Rofecoxib] Other (See Comments)    Unknown reaction/interaction. Possible stomach cramps    Current Medications: Current Outpatient Medications  Medication Sig Dispense Refill   Ascorbic Acid (VITAMIN C) 1000 MG tablet Take 1,000 mg by mouth in the morning.     CALCIUM PO Take 1,200 mg by mouth in the morning.     Carboxymethylcellulose Sodium 0.25 % SOLN Place 1-2 drops  into both eyes as needed (dry eyes).     Cholecalciferol (VITAMIN D-3) 5000 UNITS TABS Take 5,000 Units by mouth in the morning.     desloratadine (CLARINEX) 5 MG tablet Take 5 mg by mouth in the morning.     famotidine (PEPCID) 20 MG tablet Take 1 tablet (20 mg total) by mouth at bedtime. (Patient taking differently: Take 20 mg by mouth 2 (two) times daily.) 90 tablet 1   fenofibrate 160 MG tablet TAKE 1 TABLET BY MOUTH EVERY DAY 90 tablet 1   glycerin adult 2 g suppository Place 1 suppository rectally as needed for constipation.     ibuprofen (ADVIL) 200 MG tablet Take 200-400 mg by mouth every 8 (eight) hours as needed (pain.).     irbesartan-hydrochlorothiazide (AVALIDE) 150-12.5 MG tablet TAKE 1 TABLET BY MOUTH EVERY DAY 100 tablet 0   melatonin 10 MG TABS Take 10 mg by mouth at bedtime.     memantine (NAMENDA) 5 MG tablet Take 1 tablet (5 mg total) by mouth 2 (two) times daily. 200 tablet 3   mirtazapine (REMERON) 45 MG tablet Take 1 tablet (45 mg total) by mouth at bedtime. 90 tablet 2   montelukast (SINGULAIR) 10 MG tablet Take 1 tablet (10 mg total) by mouth daily. (Patient taking differently: Take 10 mg by mouth at bedtime.) 100 tablet 3   ondansetron (ZOFRAN-ODT) 4 MG disintegrating tablet Take 1 tablet (4 mg total) by mouth every 8 (eight) hours as needed for nausea or vomiting. 20 tablet 0   pantoprazole (PROTONIX) 40 MG tablet Take 1 tablet (40 mg total) by mouth 2 (two) times daily before a meal. 60 tablet 1   PROAIR HFA 108 (90 BASE) MCG/ACT inhaler Use 2 puffs into the lungs  every 6 hours as needed for wheezing or shortness of  breath (Patient taking differently: Inhale 2 puffs into the lungs every 6 (six) hours as needed for wheezing or shortness of breath.) 25.5 g 2   sertraline (ZOLOFT) 50 MG tablet TAKE ONE  TABLET BY MOUTH DAILY 90 tablet 2   No current facility-administered medications for this visit.    ROS: Review of Systems  Constitutional:  Positive for appetite  change and unexpected weight change.  HENT:  Positive for congestion, postnasal drip and trouble swallowing.        Hard of hearing  Gastrointestinal:  Negative for constipation and diarrhea.  Endocrine: Negative for polyphagia.  Neurological:  Positive for tremors.       Memory loss  Psychiatric/Behavioral:  Negative for dysphoric mood, self-injury, sleep disturbance and suicidal ideas.     Objective:  Psychiatric Specialty Exam: There were no vitals taken for this visit.There is no height or weight on file to calculate BMI.  General Appearance: Casual, Neat, Well Groomed, and wearing glasses. Appears stated age  Eye Contact:  Fair  Speech:   slight speech impediment.  Now speaking in full sentences with no delay in response.  Volume:  Normal  Mood:   "Doing great!"  Affect:  Appropriate, Congruent, Full Range, and bright and smiling.  Euthymic  Thought Content: Logical and Hallucinations: None   Suicidal Thoughts:  No  Homicidal Thoughts:  No  Thought Process:  Coherent, Goal Directed, and Linear  Orientation:  Full (Time, Place, and Person)    Memory:  Immediate;   Good able to discuss many events in recent history accurately  Judgment:  Fair  Insight:  Fair  Concentration:  Concentration: Fair and Attention Span: Fair  Recall:  [employer] of Knowledge: Fair  Language: Fair  Psychomotor Activity:  TD of jaw, lips, tongue, feet  Akathisia:  No  AIMS (if indicated): Done, worsening of lips and tongue involuntary movement  Assets:  Desire for Improvement Financial Resources/Insurance Housing Intimacy Leisure Time Physical Health Resilience Social Support Talents/Skills Transportation  ADL's:  Intact  Cognition: Impaired,  Mild  Sleep:  Good   PE: General: sits comfortably in view of camera; no acute distress  Pulm: no increased work of breathing on room air  MSK: all extremity movements appear intact  Neuro: resting tremor of jaw, lip smacking, tongue tremor on  extension and at rest, in feet as well Gait & Station: unable to assess by video    Metabolic Disorder Labs: Lab Results  Component Value Date   HGBA1C 5.0 09/12/2023   MPG 134.11 01/12/2018   No results found for: "PROLACTIN" Lab Results  Component Value Date   CHOL 218 (H) 09/12/2023   TRIG 212 (H) 09/12/2023   HDL 36 (L) 09/12/2023   CHOLHDL 6.1 (H) 09/12/2023   LDLCALC 143 (H) 09/12/2023   LDLCALC 116 (H) 03/09/2023   Lab Results  Component Value Date   TSH 2.618 01/11/2024   TSH 1.910 09/12/2023    Therapeutic Level Labs: No results found for: "LITHIUM" No results found for: "VALPROATE" No results found for: "CBMZ"  Screenings:  GAD-7    Flowsheet Row Office Visit from 01/27/2024 in [facility] Office Visit from 09/12/2023 in [facility] Office Visit from 04/18/2023 in [facility] Office Visit from 03/09/2023 in [facility] Office Visit from 02/01/2023 in [facility]  Total GAD-7 Score 0 0 0 0 0      Mini-Mental    Flowsheet Row Clinical Support from 02/07/2019 in [facility]  Total Score (max 30 points ) 30      PHQ2-9  Flowsheet Row Office Visit from 01/27/2024 in [facility] Office Visit from 09/12/2023 in [facility] Clinical Support from 04/25/2023 in [facility] Office Visit from 04/18/2023 in [facility] Office Visit from 03/09/2023 in [facility]  PHQ-2 Total Score 0 0 0 0 0  PHQ-9 Total Score 0 0 0 0 0      Flowsheet Row ED to Hosp-Admission (Discharged) from 01/10/2024 in [facility] Most recent reading at 01/10/2024  5:25 PM ED from 01/10/2024 in [facility]) Most recent reading at 01/10/2024  3:32 PM Admission (Discharged) from 12/26/2023 in [place] Most recent reading at 12/26/2023  2:31 PM  C-SSRS RISK CATEGORY No Risk No Risk No Risk       Collaboration of Care: Collaboration of Care: Primary Care Provider AEB coordination of care  Patient/Guardian was advised Release of Information must be obtained prior to any record release in order to collaborate their care with an outside provider. Patient/Guardian was advised if they have not already done so to contact the registration department to sign all necessary forms in order for [place] to release information regarding their care.   Consent: Patient/Guardian gives verbal consent for treatment and assignment of benefits for services provided during this visit. Patient/Guardian expressed understanding and agreed to proceed.   Televisit via video: I connected with Melissa Bowman on 02/17/24 at  9:30 AM EST by a video enabled telemedicine application and verified that I am speaking with the correct person using two identifiers.  Location: Patient: home Provider: Home office   I discussed the limitations of evaluation and management by telemedicine and the availability of in person appointments. The patient expressed understanding and agreed to proceed.  I discussed the assessment and treatment plan with the patient. The patient was provided an opportunity to ask questions and all were answered. The patient agreed with the plan and demonstrated an understanding of the instructions.   The patient was advised to call back or seek an in-person evaluation if the symptoms worsen or if the condition fails to improve as anticipated.  I provided 20 minutes of virtual face-to-face time during this encounter including collateral from her husband.  Elsie Lincoln, MD 02/17/2024, 9:53 AM

## 2024-02-22 DIAGNOSIS — Z4789 Encounter for other orthopedic aftercare: Secondary | ICD-10-CM | POA: Diagnosis not present

## 2024-02-22 DIAGNOSIS — I10 Essential (primary) hypertension: Secondary | ICD-10-CM | POA: Diagnosis not present

## 2024-02-22 DIAGNOSIS — K219 Gastro-esophageal reflux disease without esophagitis: Secondary | ICD-10-CM | POA: Diagnosis not present

## 2024-02-22 DIAGNOSIS — Z9181 History of falling: Secondary | ICD-10-CM | POA: Diagnosis not present

## 2024-02-22 DIAGNOSIS — D649 Anemia, unspecified: Secondary | ICD-10-CM | POA: Diagnosis not present

## 2024-02-22 DIAGNOSIS — Z79899 Other long term (current) drug therapy: Secondary | ICD-10-CM | POA: Diagnosis not present

## 2024-02-22 DIAGNOSIS — T40605S Adverse effect of unspecified narcotics, sequela: Secondary | ICD-10-CM | POA: Diagnosis not present

## 2024-02-22 DIAGNOSIS — G928 Other toxic encephalopathy: Secondary | ICD-10-CM | POA: Diagnosis not present

## 2024-02-22 DIAGNOSIS — M6281 Muscle weakness (generalized): Secondary | ICD-10-CM | POA: Diagnosis not present

## 2024-02-22 DIAGNOSIS — G9340 Encephalopathy, unspecified: Secondary | ICD-10-CM | POA: Diagnosis not present

## 2024-02-22 DIAGNOSIS — S42302D Unspecified fracture of shaft of humerus, left arm, subsequent encounter for fracture with routine healing: Secondary | ICD-10-CM | POA: Diagnosis not present

## 2024-02-22 DIAGNOSIS — J4520 Mild intermittent asthma, uncomplicated: Secondary | ICD-10-CM | POA: Diagnosis not present

## 2024-02-22 DIAGNOSIS — T48205S Adverse effect of unspecified drugs acting on muscles, sequela: Secondary | ICD-10-CM | POA: Diagnosis not present

## 2024-02-22 DIAGNOSIS — F01A3 Vascular dementia, mild, with mood disturbance: Secondary | ICD-10-CM | POA: Diagnosis not present

## 2024-02-22 DIAGNOSIS — S42232D 3-part fracture of surgical neck of left humerus, subsequent encounter for fracture with routine healing: Secondary | ICD-10-CM | POA: Diagnosis not present

## 2024-02-22 DIAGNOSIS — E119 Type 2 diabetes mellitus without complications: Secondary | ICD-10-CM | POA: Diagnosis not present

## 2024-02-23 ENCOUNTER — Other Ambulatory Visit: Payer: Self-pay | Admitting: Family Medicine

## 2024-02-23 ENCOUNTER — Other Ambulatory Visit: Payer: Self-pay | Admitting: Gastroenterology

## 2024-02-27 ENCOUNTER — Ambulatory Visit: Payer: Medicare HMO | Admitting: Neurology

## 2024-02-27 VITALS — BP 118/66 | HR 60 | Ht 63.0 in | Wt 129.0 lb

## 2024-02-27 DIAGNOSIS — R634 Abnormal weight loss: Secondary | ICD-10-CM | POA: Diagnosis not present

## 2024-02-27 DIAGNOSIS — Z9181 History of falling: Secondary | ICD-10-CM | POA: Diagnosis not present

## 2024-02-27 DIAGNOSIS — G4733 Obstructive sleep apnea (adult) (pediatric): Secondary | ICD-10-CM | POA: Diagnosis not present

## 2024-02-27 NOTE — Patient Instructions (Signed)
Please continue using your autoPAP regularly. While your insurance requires that you use PAP at least 4 hours each night on 70% of the nights, I recommend, that you not skip any nights and use it throughout the night if you can. Getting used to PAP and staying with the treatment long term does take time and patience and discipline. Untreated obstructive sleep apnea when it is moderate to severe can have an adverse impact on cardiovascular health and raise her risk for heart disease, arrhythmias, hypertension, congestive heart failure, stroke and diabetes. Untreated obstructive sleep apnea causes sleep disruption, nonrestorative sleep, and sleep deprivation. This can have an impact on your day to day functioning and cause daytime sleepiness and impairment of cognitive function, memory loss, mood disturbance, and problems focussing. Using PAP regularly can improve these symptoms.   We can see you in 1 year, you can see one of our nurse practitioners as you are stable. 

## 2024-02-27 NOTE — Progress Notes (Signed)
 Subjective:    Patient ID: Melissa Bowman is a 73 y.o. female.  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  I last saw her on 02/22/2023, at which time she was compliant with her AutoPap machine.  She was no longer on Abilify or Lexapro and had started mirtazapine.  Today, 02/27/2024: I reviewed her AutoPap compliance data from 01/27/2024 through 02/25/2024, which is a total of 30 days, during which time she used her machine 29 days with percent use days greater than 4 hours at 70%, indicating adequate compliance, average usage for days on treatment of 5 hours and 28 minutes, residual AHI at goal at 0.5/h, 95th percentile of pressure at 9.8 cm with a range of 8 to 13 cm, leak acceptable but variable with the 95th percentile at 18.4 L/min.  She has a G3 AutoPap machine from [employer] [facility] Doy Mince).she reports losing weight, compared to about a year ago she lost a significant amount of weight but is now more stable.  She is willing to continue with her AutoPap machine.  She is not keen on getting another sleep study at this time. She feels stable, much better since her hospitalization a little over a month ago.  She reports that she is doing word find puzzles regularly.  She is trying to hydrate well with water, estimates that she drinks about 8 to 10 cups of water per day on average.  She does not drink any sodas and drinks coffee, 1 or 2 cups in the mornings.  She is healing from her humerus fracture, she has a patch on her wound for better wound healing.  Of note,  she was hospitalized from 01/10/2024 through 01/16/2024 with altered mental status and dysarthria, which were deemed secondary to polypharmacy.  She had a brain MRI without contrast on 01/10/2024 and I reviewed the results:  IMPRESSION: Evaluation is limited by motion artifact. Within this limitation, no acute intracranial process. No evidence of acute or subacute infarct.  She had left humerus fracture ORIF on 12/26/2023.  She has been seeing gastroenterology for reflux disease and dysphagia.  She is followed closely by behavioral health.  She also sees family medicine, gastroenterology, orthopedics, and ENT.     The patient's allergies, current medications, family history, past medical history, past social history, past surgical history and problem list were reviewed and updated as appropriate.    Previously:    I saw her on 02/05/2021 for sleep apnea evaluation.  She had a home sleep test in April 2022 which showed evidence of severe sleep apnea.  I wrote for a new AutoPap machine.  She started treatment with her machine in the spring 2022.   She had a follow-up appointment with Ihor Austin, NP on 11/03/2021, at which time she was compliant with her AutoPap machine.  She was advised to follow-up in 1 year.   02/05/21: (She) presents for a new problem visit for evaluation of her sleep apnea.  The patient is accompanied by her husband today, and is referred by her primary care physician, Dr. Nadine Counts.     She reports being consistent with her  CPAP.  She did not bring her machine, per husband, it is automatic PAP machine.  He purchased this machine from a local pharmacy after her first 1 broke.  He gets supplies on [employer].  He reports that she was diagnosed with severe sleep apnea.  Testing was over 10 years ago and this machine is at least 73 years old.  She uses a nasal mask.  She has benefited from treatment.  Her original sleep apnea diagnosis may date back much further since she had UPPP and  tonsillectomy under Dr. Lazarus Salines in 1997 per patient (per chart review: in May 1996).  She goes to bed around 930 and sleeps typically till 730.  If her husband is not there in the morning because he is picking up grandchildren or running an errand, she may sleep till 930.  She does feel sleepy during the day.  Her Epworth sleepiness score is 12 out of 24, fatigue severity score is 57 out of 63.  She drinks caffeine in the form of coffee, 2 cups in the morning.  She is a non-smoker and does not drink alcohol.   She reports that she cannot sleep without CPAP.I have evaluated her for her gait disorder and parkinsonism and last saw her on 11/20/2020, at which time we talked about her benign DaTscan results.  She was advised to proceed with a brain MRI without contrast.   She had an interim brain MRI without contrast on 11/30/2020 and I reviewed the results:    IMPRESSION: This MRI of the brain without contrast shows the following: 1.   Scattered T2/FLAIR hyperintense foci in the hemispheres, right internal capsule and right basal ganglia consistent with chronic microvascular ischemic change.  None of the foci appear to be acute.  Compared to the CT scan from 2018, there do do not appear to be any new lesions. 2.   Left chronic sphenoid sinusitis.   3.   No acute findings.   We called her with her test results.     I first met her at the request of her primary care physician on 08/06/2020, at which time she had a several month history of difficulty with her balance and mobility, changes in her posture and her gait.  She had evidence of parkinsonism without telltale lateralization.  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  She was on Abilify at the time. I suggested we proceed with a DaTscan.  We delayed the DaTscan until she was off of Abilify as she was weaned off of it.     She had a DaT scan on 11/13/20 and I reviewed the results: IMPRESSION: Normal DATscan. Findings are NOT suggestive of  Parkinson's syndrome Pathology   They were notified of the test results by phone call.   11/20/20: She reports feeling the same, not much different, certainly not worse. Per daughter, her trembling and mobility may be a little bit better. She was never actually completely off of the Abilify and reduce it to 2.5 mg daily but felt better on 5 mg daily. Daughter reports that patient's husband has also noted decline in the mood since the Abilify was reduced. The patient has hearing aids but never liked using them and does not typically use them now. She is compliant with her CPAP but has not had reevaluation in years, she purchased a CPAP machine or AutoPap machine out-of-pocket several years ago, she had a gap in her insurance at the time and needed a new machine. Sleep testing was  probably 15 or 20 years ago. Memory is more or less stable, she is forgetful. Her daughter is concerned that she may have had a lacunar stroke before. Back in April, around Easter, in fact when they had a family gathering and were taking pictures for Easter, patient had an episode of not being fully attentive, not acting her typical self, no obvious one-sided weakness or numbness or slurring of speech or droopy face at the time. She has not had a recent brain MRI.     08/06/20: (She) reports very little of her own history, primarily due to hearing loss. Her history is primarily provided by her husband, who reports that since approximately April of this year the patient has had more difficulty with her balance and mobility.  She has had changes in her mood.  He reports that she has a longstanding history of depression and anxiety and has tried different medications.  She was on Effexor for years and recently she was placed on Abilify and Lexapro.  This was around March 2021.  She has had changes in her posture and her gait, she has been noted to be slower in her movements.  This has also been noted by her daughter who is a Engineer, civil (consulting).  She  endorses stress, particularly with regards to her other daughter.  Both daughters live close by.  She has a total of 5 grandchildren and has been taking care of 2 grandchildren ages 48 and 70 but the 62-year-old recently started school which has improved her stress level she indicates.  She has not fallen recently but has fallen in the past.  She is the youngest of a total of 16 children and most of her siblings passed away, she denies any history of tremor in her siblings or memory loss but reports that her father had Parkinson's disease and died at age 6 and her brother had Parkinson's disease and Alzheimer's dementia and died in his 24s, probably around 6.  She has now 3 sisters alive and one more brother alive.  She is a non-smoker and drinks no alcohol, she likes to drink coffee and drinks about 2 to 3 cups/day.  She has had unintended weight loss since approximately April of this year and had some work-up for this.  She denies any loss of appetite.  She has hearing aids but does not wear them.  She had surgery for sleep apnea in 1997 and also uses a CPAP machine.  She reports trouble with her right shoulder and had 3 rotator cuff surgeries.  She has had some memory loss including forgetfulness.  She has been sleeping fairly well but does have a history of dream enactment behavior for the past 1+ year.  Within the past year she fell out of bed twice, she reports that she rolled out of bed and did not injure herself and went back to bed.  She takes melatonin for sleep.   She had a head CT and cervical spine CT without contrast after a fall on 11/17/2017 reviewed the results: IMPRESSION: Left frontoparietal scalp contusion.   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 No acute intracranial abnormality.   No acute cervical spine fracture or posttraumatic listhesis.      Her Past Medical History Is Significant For: Past Medical History:  Diagnosis Date   Allergic rhinitis     Allergy to ertapenem    Anxiety 08/27/2011   Anxiety and depression    Asthmatic bronchitis    Bilateral carpal  tunnel syndrome    Condyloma acuminata    Diverticulosis    Eczema    GERD (gastroesophageal reflux disease)    H. pylori infection 11/16/1993   Hiatal hernia    History of benign adrenal tumor 2007   [facility]   History of pneumonia 2007, 2008   Hyperlipidemia    Hypertension    IBS (irritable bowel syndrome)    Internal hemorrhoids    Kyphosis    Long term current use of antipsychotic medication 08/19/2022   Memory loss    Menopause    Metabolic syndrome    OSA on CPAP    Prolonged Q-T interval on ECG 08/19/2022   Type 2 diabetes mellitus (HCC)    Vitamin D deficiency     Her Past Surgical History Is Significant For: Past Surgical History:  Procedure Laterality Date   abdominal wall lematoma of fabdd hys.     APPENDECTOMY     CARPAL TUNNEL RELEASE     CESAREAN SECTION     X2   COLONOSCOPY     INCISIONAL HERNIA REPAIR     ORIF HUMERUS FRACTURE Left 12/26/2023   Procedure: OPEN REDUCTION INTERNAL FIXATION (ORIF) HUMERUS FRACTURE;  Surgeon: Yolonda Kida, MD;  Location: [facility] OR;  Service: Orthopedics;  Laterality: Left;  90   right breast biopsy  09/1995   ROTATOR CUFF REPAIR  2002   right   ROTATOR CUFF REPAIR/ bone spur  08/30/2009   SHOULDER ARTHROSCOPY WITH ROTATOR CUFF REPAIR AND SUBACROMIAL DECOMPRESSION Right 01/19/2018   Procedure: REVISION RIGHT MINI OPEN ROTATOR CUFF REPAIR AND SUBACROMIAL DECOMPRESSION;  Surgeon: Jene Every, MD;  Location: [facility] ORS;  Service: Orthopedics;  Laterality: Right;  90 MINS   sleep apnea surgery  04/1995   torn cartiledge rt knee x2  05/13/2006   Dr. Sherlean Foot Smoc    TOTAL ABDOMINAL HYSTERECTOMY W/ BILATERAL SALPINGOOPHORECTOMY  07/1993   fibroids     Her Family History Is Significant For: Family History  Problem Relation Age of Onset   Heart disease Mother    Parkinsonism Father    Emphysema Father    Hip  fracture Father    Heart failure Sister    Ovarian cancer Sister    COPD Sister    Lymphoma Sister    COPD Sister    COPD Brother    Hypertension Brother    Heart disease Brother    COPD Brother    Colon cancer Neg Hx    Esophageal cancer Neg Hx    Pancreatic cancer Neg Hx    Stomach cancer Neg Hx    Breast cancer Neg Hx     Her Social History Is Significant For: Social History   Socioeconomic History   Marital status: Married    Spouse name: Melissa Bowman   Number of children: 2   Years of education: some college   Highest education level: Some college, no degree  Occupational History   Occupation: retired    Comment: daycare  Tobacco Use   Smoking status: Never   Smokeless tobacco: Never  Vaping Use   Vaping status: Never Used  Substance and Sexual Activity   Alcohol use: No   Drug use: No   Sexual activity: Not on file  Other Topics Concern   Not on file  Social History Narrative   Melissa Bowman is retired. She lives with her husband Melissa Bowman. They have two grown daughters that live locally. Melissa Bowman enjoys reading and puzzles.       Social  Drivers of Health   Financial Resource Strain: Low Risk  (04/25/2023)   Overall Financial Resource Strain (CARDIA)    Difficulty of Paying Living Expenses: Not hard at all  Food Insecurity: Patient Declined (01/11/2024)   Hunger Vital Sign    Worried About Running Out of Food in the Last Year: Patient declined    Ran Out of Food in the Last Year: Patient declined  Transportation Needs: No Transportation Needs (01/20/2024)   Received [facility] - Transportation    Lack of Transportation (Medical): No    Lack of Transportation (Non-Medical): No  Physical Activity: Insufficiently Active (04/25/2023)   Exercise Vital Sign    Days of Exercise per Week: 3 days    Minutes of Exercise per Session: 30 min  Stress: No Stress Concern Present (04/25/2023)   Melissa Bowman [employer] - Occupational Stress  Questionnaire    Feeling of Stress : Not at all  Social Connections: Unknown (01/11/2024)   Social Connection and Isolation Panel [NHANES]    Frequency of Communication with Friends and Family: Patient declined    Frequency of Social Gatherings with Friends and Family: Patient declined    Attends Religious Services: Never    Diplomatic Services operational officer: Patient declined    Attends Engineer, structural: Patient declined    Marital Status: Patient declined    Her Allergies Are:  Allergies  Allergen Reactions   Statins Other (See Comments)    myalgias   E-Mycin [Erythromycin] Other (See Comments)    Medication intolerance stomach cramps   Estrogens Other (See Comments)    Estrogen replacement Therapy intolerance mood changes   Sulfa Antibiotics Other (See Comments)    Causes severe stomach cramps    Vioxx [Rofecoxib] Other (See Comments)    Unknown reaction/interaction. Possible stomach cramps  :   Her Current Medications Are:  Outpatient Encounter Medications as of 02/27/2024  Medication Sig   Ascorbic Acid (VITAMIN C) 1000 MG tablet Take 1,000 mg by mouth in the morning.   CALCIUM PO Take 1,200 mg by mouth in the morning.   Cholecalciferol (VITAMIN D-3) 5000 UNITS TABS Take 5,000 Units by mouth in the morning.   desloratadine (CLARINEX) 5 MG tablet Take 5 mg by mouth in the morning.   famotidine (PEPCID) 20 MG tablet TAKE ONE TABLET BY MOUTH AT BEDTIME   fenofibrate 160 MG tablet TAKE 1 TABLET BY MOUTH EVERY DAY   glycerin adult 2 g suppository Place 1 suppository rectally as needed for constipation.   ibuprofen (ADVIL) 200 MG tablet Take 200-400 mg by mouth every 8 (eight) hours as needed (pain.).   irbesartan-hydrochlorothiazide (AVALIDE) 150-12.5 MG tablet TAKE 1 TABLET BY MOUTH EVERY DAY   melatonin 10 MG TABS Take 10 mg by mouth at bedtime.   memantine (NAMENDA) 5 MG tablet Take 1 tablet (5 mg total) by mouth 2 (two) times daily.   mirtazapine (REMERON)  45 MG tablet Take 1 tablet (45 mg total) by mouth at bedtime.   montelukast (SINGULAIR) 10 MG tablet Take 1 tablet (10 mg total) by mouth daily. (Patient taking differently: Take 10 mg by mouth at bedtime.)   ondansetron (ZOFRAN-ODT) 4 MG disintegrating tablet Take 1 tablet (4 mg total) by mouth every 8 (eight) hours as needed for nausea or vomiting.   pantoprazole (PROTONIX) 40 MG tablet TAKE 1 TABLET 2 TIMES A DAY BEFORE A MEAL   PROAIR HFA 108 (90 BASE) MCG/ACT inhaler Use  2 puffs into the lungs  every 6 hours as needed for wheezing or shortness of  breath (Patient taking differently: Inhale 2 puffs into the lungs every 6 (six) hours as needed for wheezing or shortness of breath.)   sertraline (ZOLOFT) 50 MG tablet TAKE ONE TABLET BY MOUTH DAILY   valsartan-hydrochlorothiazide (DIOVAN-HCT) 80-12.5 MG tablet Take 1 tablet by mouth daily.   Carboxymethylcellulose Sodium 0.25 % SOLN Place 1-2 drops into both eyes as needed (dry eyes). (Patient not taking: Reported on 02/27/2024)   [DISCONTINUED] famotidine (PEPCID) 20 MG tablet Take 1 tablet (20 mg total) by mouth at bedtime. (Patient taking differently: Take 20 mg by mouth 2 (two) times daily.)   No facility-administered encounter medications on file as of 02/27/2024.  :  Review of Systems:  Out of a complete 14 point review of systems, all are reviewed and negative with the exception of these symptoms as listed below:  Review of Systems  Neurological:        Patient in room #5 with her husband. Patient states she is well and stable, with no new concerns.  ESS-0, FSS-9    Objective:  Neurological Exam  Physical Exam Physical Examination:   Vitals:   02/27/24 1304  BP: 118/66  Pulse: 60    General Examination: The patient is a very pleasant 73 y.o. female in no acute distress. She appears well-developed and well-nourished and well groomed.   HEENT: Normocephalic, atraumatic, pupils are equal, round and reactive to light. Tracking is  mildly impaired with saccadic breakdown of smooth pursuit.  She has mild facial masking.  She has mild nuchal rigidity.  She has a lower lip and jaw tremor, intermittent.  She is hard of hearing, no hearing aids in place.  Speech is otherwise scant, no dysarthria, airway examination reveals mild mouth dryness, status post UPPP, thicker tongue noted, tonsils absent.  No carotid bruits.  Mild orofacial dyskinesias noted, not new.   Chest: Clear to auscultation without wheezing, rhonchi or crackles noted.   Heart: S1+S2+0, regular and normal without murmurs, rubs or gallops noted.    Abdomen: Soft, non-tender and non-distended.   Extremities: There is no pitting edema in the distal lower extremities bilaterally.   Skin: Warm and dry without trophic changes noted.   Musculoskeletal: exam reveals decreased mobility in her right shoulder.  Limited exam of left upper extremity due to recent humerus fracture.   Neurologically:  Mental status: The patient is awake, alert and oriented, history is primarily given by patient and supplemented by her husband today. Mood is normal and affect is normal, good spirits today.   Cranial nerves II - XII are as described above under HEENT exam.  Motor exam: Normal bulk, tone is near normal, no obvious cogwheeling, very slight and intermittent resting tremor in both upper extremities, mild resting tremor in left foot.  On fine motor testing, she has mild impairment in both upper and lower extremities.  She has overall mild to moderate slowness in her movements, no dyskinesias.  Cerebellar testing: No dysmetria or intention tremor. There is no truncal or gait ataxia.  Sensory exam: intact to light touch in the upper and lower extremities. Gait, station and balance: She stands up with mild difficulty and pushes herself up.  Her posture is mildly stooped for age, she walks slowly and has decreased swing bilaterally.   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 She is compliant with her AutoPap machine and commended for it.  She has benefited from treatment.  She lost weight within the past year but is willing to continue with treatment.  She has no tolerance issues and is encouraged to continue with the current settings and follow-up routinely in 1 year in our sleep clinic.  We talked about the importance of maintaining a healthy lifestyle, including good hydration, good nutrition, and getting enough rest and fall prevention as well today.  She is not quite eligible for new machine, she will be eligible in 2027.  At that time we can consider doing a home sleep test for reassessment. I answered all their questions today and the patient and her husband were in agreement. I spent 40 minutes in total face-to-face time and in reviewing records during pre-charting, more than 50% of which was spent in counseling and coordination of care, reviewing test results, reviewing medications and treatment regimen and/or in discussing or reviewing the diagnosis of OSA, history of recent fall, weight loss, the prognosis and treatment options. Pertinent laboratory and imaging test results that were available during this visit with the patient were reviewed by me and considered in my medical decision making (see chart for details).

## 2024-02-28 ENCOUNTER — Other Ambulatory Visit: Payer: Self-pay | Admitting: Family Medicine

## 2024-03-07 ENCOUNTER — Ambulatory Visit: Payer: Medicare HMO | Admitting: Family Medicine

## 2024-03-07 ENCOUNTER — Encounter: Payer: Self-pay | Admitting: Family Medicine

## 2024-03-07 VITALS — BP 140/75 | HR 60 | Temp 98.4°F | Ht 63.0 in | Wt 132.0 lb

## 2024-03-07 DIAGNOSIS — I152 Hypertension secondary to endocrine disorders: Secondary | ICD-10-CM

## 2024-03-07 DIAGNOSIS — M609 Myositis, unspecified: Secondary | ICD-10-CM

## 2024-03-07 DIAGNOSIS — E1169 Type 2 diabetes mellitus with other specified complication: Secondary | ICD-10-CM | POA: Diagnosis not present

## 2024-03-07 DIAGNOSIS — T466X5D Adverse effect of antihyperlipidemic and antiarteriosclerotic drugs, subsequent encounter: Secondary | ICD-10-CM

## 2024-03-07 DIAGNOSIS — D649 Anemia, unspecified: Secondary | ICD-10-CM | POA: Diagnosis not present

## 2024-03-07 DIAGNOSIS — E785 Hyperlipidemia, unspecified: Secondary | ICD-10-CM

## 2024-03-07 DIAGNOSIS — B001 Herpesviral vesicular dermatitis: Secondary | ICD-10-CM | POA: Diagnosis not present

## 2024-03-07 DIAGNOSIS — E1159 Type 2 diabetes mellitus with other circulatory complications: Secondary | ICD-10-CM | POA: Diagnosis not present

## 2024-03-07 DIAGNOSIS — T466X5A Adverse effect of antihyperlipidemic and antiarteriosclerotic drugs, initial encounter: Secondary | ICD-10-CM | POA: Diagnosis not present

## 2024-03-07 LAB — BAYER DCA HB A1C WAIVED: HB A1C (BAYER DCA - WAIVED): 5 % (ref 4.8–5.6)

## 2024-03-07 MED ORDER — VALACYCLOVIR HCL 1 G PO TABS: 1000.0000 mg | ORAL_TABLET | Freq: Two times a day (BID) | ORAL | 0 refills | Status: AC

## 2024-03-07 NOTE — Progress Notes (Signed)
 Subjective: CC:DM PCP: Melissa Bowman, Melissa Bowman ZOX:[id number] [name] is a 73 y.o. female presenting to clinic today for:  1. Type 2 Diabetes with hypertension, hyperlipidemia:  Diabetes is diet controlled.  She is compliant with her blood pressure medication.  She has not been able to tolerate statin secondary to myalgias.  She does not eat sweets.  Diabetes Health Maintenance Due  Topic Date Due   FOOT EXAM  03/08/2024   HEMOGLOBIN A1C  03/11/2024   OPHTHALMOLOGY EXAM  07/18/2024    Last A1c:  Lab Results  Component Value Date   HGBA1C 5.0 09/12/2023    ROS: Denies pain, shortness of breath, falls.  She reports a fever blister and some allergies that are refractory to use of Clarinex, Astelin and Singulair.  Has been using Chapstick on the fever blister  ROS: Per HPI  Allergies  Allergen Reactions   Statins Other (See Comments)    myalgias   E-Mycin [Erythromycin] Other (See Comments)    Medication intolerance stomach cramps   Estrogens Other (See Comments)    Estrogen replacement Therapy intolerance mood changes   Sulfa Antibiotics Other (See Comments)    Causes severe stomach cramps    Vioxx [Rofecoxib] Other (See Comments)    Unknown reaction/interaction. 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 pylori infection 11/16/1993   Hiatal hernia    History of benign adrenal tumor 2007   [facility]   History of pneumonia 2007, 2008   Hyperlipidemia    Hypertension    IBS (irritable bowel syndrome)    Internal hemorrhoids    Kyphosis    Long term current use of antipsychotic medication 08/19/2022   Memory loss    Menopause    Metabolic syndrome    OSA on CPAP    Prolonged Q-T interval on ECG  08/19/2022   Type 2 diabetes mellitus (HCC)    Vitamin D deficiency     Current Outpatient Medications:    Ascorbic Acid (VITAMIN C) 1000 MG tablet, Take 1,000 mg by mouth in the morning., Disp: , Rfl:    CALCIUM PO, Take 1,200 mg by mouth in the morning., Disp: , Rfl:    Carboxymethylcellulose Sodium 0.25 % SOLN, Place 1-2 drops into both eyes as needed (dry eyes). (Patient not taking: Reported on 02/27/2024), Disp: , Rfl:    Cholecalciferol (VITAMIN D-3) 5000 UNITS TABS, Take 5,000 Units by mouth in the morning., Disp: , Rfl:    desloratadine (CLARINEX) 5 MG tablet, Take 5 mg by mouth in the morning., Disp: , Rfl:    famotidine (PEPCID) 20 MG tablet, TAKE ONE TABLET BY MOUTH AT BEDTIME, Disp: 90 tablet, Rfl: 1   fenofibrate 160 MG tablet, TAKE 1 TABLET BY MOUTH EVERY DAY, Disp: 90 tablet, Rfl: 0   glycerin adult 2 g suppository, Place 1 suppository rectally as needed for constipation., Disp: , Rfl:    ibuprofen (ADVIL) 200 MG tablet, Take 200-400 mg by mouth every 8 (eight) hours as needed (pain.)., Disp: , Rfl:    irbesartan-hydrochlorothiazide (AVALIDE) 150-12.5 MG tablet, TAKE 1 TABLET BY MOUTH EVERY DAY, Disp: 100 tablet, Rfl: 0   melatonin 10  MG TABS, Take 10 mg by mouth at bedtime., Disp: , Rfl:    memantine (NAMENDA) 5 MG tablet, Take 1 tablet (5 mg total) by mouth 2 (two) times daily., Disp: 200 tablet, Rfl: 3   mirtazapine (REMERON) 45 MG tablet, Take 1 tablet (45 mg total) by mouth at bedtime., Disp: 90 tablet, Rfl: 2   montelukast (SINGULAIR) 10 MG tablet, Take 1 tablet (10 mg total) by mouth daily. (Patient taking differently: Take 10 mg by mouth at bedtime.), Disp: 100 tablet, Rfl: 3   ondansetron (ZOFRAN-ODT) 4 MG disintegrating tablet, Take 1 tablet (4 mg total) by mouth every 8 (eight) hours as needed for nausea or vomiting., Disp: 20 tablet, Rfl: 0   pantoprazole (PROTONIX) 40 MG tablet, TAKE 1 TABLET 2 TIMES A DAY BEFORE A MEAL, Disp: 60 tablet, Rfl: 0   PROAIR HFA 108 (90  BASE) MCG/ACT inhaler, Use 2 puffs into the lungs  every 6 hours as needed for wheezing or shortness of  breath (Patient taking differently: Inhale 2 puffs into the lungs every 6 (six) hours as needed for wheezing or shortness of breath.), Disp: 25.5 g, Rfl: 2   sertraline (ZOLOFT) 50 MG tablet, TAKE ONE TABLET BY MOUTH DAILY, Disp: 90 tablet, Rfl: 2   valsartan-hydrochlorothiazide (DIOVAN-HCT) 80-12.5 MG tablet, Take 1 tablet by mouth daily., Disp: , Rfl:  Social History   Socioeconomic History   Marital status: Married    Spouse name: Melissa Bowman   Number of children: 2   Years of education: some college   Highest education level: Some college, no degree  Occupational History   Occupation: retired    Comment: daycare  Tobacco Use   Smoking status: Never   Smokeless tobacco: Never  Vaping Use   Vaping status: Never Used  Substance and Sexual Activity   Alcohol use: No   Drug use: No   Sexual activity: Not on file  Other Topics Concern   Not on file  Social History Narrative   Melissa Bowman is retired. She lives with her husband Melissa Bowman. They have two grown daughters that live locally. Melissa Bowman enjoys reading and puzzles.       Social Drivers of Corporate investment banker Strain: Low Risk  (04/25/2023)   Overall Financial Resource Strain (CARDIA)    Difficulty of Paying Living Expenses: Not hard at all  Food Insecurity: Patient Declined (01/11/2024)   Hunger Vital Sign    Worried About Running Out of Food in the Last Year: Patient declined    Ran Out of Food in the Last Year: Patient declined  Transportation Needs: No Transportation Needs (01/20/2024)   Received from [facility] - Transportation    Lack of Transportation (Medical): No    Lack of Transportation (Non-Medical): No  Physical Activity: Insufficiently Active (04/25/2023)   Exercise Vital Sign    Days of Exercise per Week: 3 days    Minutes of Exercise per Session: 30 min  Stress: No Stress Concern Present  (04/25/2023)   [employer] of Occupational Health - Occupational Stress Questionnaire    Feeling of Stress : Not at all  Social Connections: Unknown (01/11/2024)   Social Connection and Isolation Panel [NHANES]    Frequency of Communication with Friends and Family: Patient declined    Frequency of Social Gatherings with Friends and Family: Patient declined    Attends Religious Services: Never    Database administrator or Organizations: Patient declined    Attends Banker  Meetings: Patient declined    Marital Status: Patient declined  Intimate Partner Violence: Unknown (01/11/2024)   Humiliation, Afraid, Rape, and Kick questionnaire    Fear of Current or Ex-Partner: Patient declined    Emotionally Abused: Patient declined    Physically Abused: Patient declined    Sexually Abused: Not on file   Family History  Problem Relation Age of Onset   Heart disease Mother    Parkinsonism Father    Emphysema Father    Hip fracture Father    Heart failure Sister    Ovarian cancer Sister    COPD Sister    Lymphoma Sister    COPD Sister    COPD Brother    Hypertension Brother    Heart disease Brother    COPD Brother    Colon cancer Neg Hx    Esophageal cancer Neg Hx    Pancreatic cancer Neg Hx    Stomach cancer Neg Hx    Breast cancer Neg Hx     Objective: Office vital signs reviewed. BP (!) 140/75   Pulse 60   Temp 98.4 F (36.9 C)   Ht 5\' 3"  (1.6 m)   Wt 132 lb (59.9 kg)   SpO2 98%   BMI 23.38 kg/m   Physical Examination:  General: Awake, alert, nontoxic-appearing elderly female, No acute distress HEENT: Fever blister noted on the left lower lip.  Oropharynx without significant erythema or exudates.  TMs intact bilaterally with scant cerumen in the right external auditory canal.  Wears hearing aids Cardio: regular rate and rhythm, S1S2 heard, no murmurs appreciated Pulm: clear to auscultation bilaterally, no wheezes, rhonchi or rales; normal work of breathing  on room air MSK: Ambulating independently.   Assessment/ Plan: 73 y.o. female   Type 2 diabetes mellitus with other specified complication, without long-term current use of insulin (HCC) - Plan: Bayer DCA Hb A1c Waived  Hypertension associated with diabetes (HCC)  Hyperlipidemia associated with type 2 diabetes mellitus (HCC) - Plan: Lipid Panel  Statin-induced myositis - Plan: Lipid Panel  Anemia, unspecified type - Plan: Anemia Profile B  Fever blister - Plan: valACYclovir (VALTREX) 1000 MG tablet  Sugar under excellent control through diet alone with A1c at 5.0 today.  Blood pressure controlled.  No changes.  Fasting lipid panel collected today.  Has history of statin induced myositis but could consider something like Zetia or Repatha  Check anemia profile given anemia noted last lab draw  Valtrex given for fever blister  Will plan for diabetic foot exam at next visit, EKG and CC that to her psychiatrist if needed  Melissa Bowman, Melissa Bowman [facility] [phone]

## 2024-03-07 NOTE — Patient Instructions (Addendum)
 Local honey is a GREAT way to help with allergies. A teaspoonful per day should be enough. Ok to mix in coffee or tea if needed.  Cold Sore  A cold sore, also called a fever blister, is a small, fluid-filled sore that forms inside of the mouth or on the lips, gums, nose, chin, or cheeks. Cold sores can spread to other parts of the body, such as the eyes, fingers, or genitals. Cold sores can spread from person to person (are contagious) until the sores crust over completely. Most cold sores go away within 2 weeks. What are the causes? Cold sores are caused by a virus (herpes simplex virus type 1, HSV-1). The virus can spread from person to person through close contact, such as through: Kissing. Touching the affected area. Sharing personal items such as lip balm, razors, a drinking glass, or eating utensils. What increases the risk? Being tired, stressed, or sick. Having your period (menstruating). Being pregnant. Taking certain medicines. Being out in cold weather or getting too much sun. What are the signs or symptoms? Symptoms of a cold sore go through different stages: Tingling, itching, or burning is felt 1-2 days before the cold sore appears. Fluid-filled blisters appear on the lips, inside the mouth, on the nose, or on the cheeks. The blisters start to ooze clear fluid. The blisters dry up, and a yellow crust appears in their place. The crust falls off. In some cases, other symptoms can develop along with cold sores. These can include: Fever. Sore throat. Headache. Muscle aches. Swollen neck glands. How is this treated? There is no cure for cold sores or the virus that causes them. There is also no vaccine to prevent the virus. Most cold sores go away on their own without treatment within 2 weeks. Your doctor may prescribe medicines to: Help with pain. Keep the virus from growing. Help you heal faster. Medicines may be in the form of creams, gels, pills, or a shot. Follow  these instructions at home: Medicines Take or apply over-the-counter and prescription medicines only as told by your doctor. Use a cotton-tip swab to apply creams or gels to your sores. Ask your doctor if you can take lysine supplements. These may help with healing. Sore care  Do not touch the sores or pick the scabs. Wash your hands often with soap and water for at least 20 seconds. Do not touch your eyes without washing your hands first. Keep the sores clean and dry. If told, put ice on the sores. To do this: Put ice in a plastic bag. Place a towel between your skin and the bag. Leave the ice on for 20 minutes, 2-3 times a day. Take off the ice if your skin turns bright red. This is very important. If you cannot feel pain, heat, or cold, you have a greater risk of damage to the area. Eating and drinking Eat a soft, bland diet. Avoid eating hot, cold, or salty foods. These can hurt your mouth. Use a straw if it hurts to drink out of a glass. Eat foods that have a lot of lysine in them. These include meat, fish, and dairy products. Avoid sugary foods, chocolates, nuts, and grains. These foods have a high amount of a substance (arginine) that can cause the virus to grow. Lifestyle Do not kiss, have oral sex, or share personal items until your sores heal. Stress, poor sleep, and being out in the sun can trigger a cold sore. Make sure you: Do activities that  help you relax, such as deep breathing exercises or meditation. Get enough sleep. Put sunscreen on your lips before you go out in the sun. Contact a doctor if: You have symptoms for more than 2 weeks. You have pus coming from the sores. You have redness that is spreading. You have pain or irritation in your eye. You get sores on your genitals. Your sores do not heal within 2 weeks. You get cold sores often. Get help right away if: You have a fever and your symptoms suddenly get worse. You have a headache and confusion. You have  tiredness (fatigue). You do not want to eat as much as normal (loss of appetite). You have a stiff neck or are sensitive to light. Summary A cold sore is a small, fluid-filled sore that forms inside of the mouth or on the lips, gums, nose, chin, or cheeks. Cold sores can spread from person to person (are contagious) until the sores crust over completely. Most cold sores go away within 2 weeks. Wash your hands often. Do not touch your eyes without washing your hands first. Do not kiss, have oral sex, or share personal items until your sores heal. Contact a doctor if your sores do not heal within 2 weeks. This information is not intended to replace advice given to you by your health care provider. Make sure you discuss any questions you have with your health care provider. Document Revised: 09/09/2021 Document Reviewed: 09/09/2021 Elsevier Patient Education  2024 [employer].

## 2024-03-08 LAB — ANEMIA PROFILE B
Basophils Absolute: 0.1 10*3/uL (ref 0.0–0.2)
Basos: 1 %
EOS (ABSOLUTE): 0.1 10*3/uL (ref 0.0–0.4)
Eos: 2 %
Ferritin: 155 ng/mL — ABNORMAL HIGH (ref 15–150)
Folate: 9.8 ng/mL (ref >3.0)
Hematocrit: 32.1 % — ABNORMAL LOW (ref 34.0–46.6)
Hemoglobin: 10.6 g/dL — ABNORMAL LOW (ref 11.1–15.9)
Immature Grans (Abs): 0 10*3/uL (ref 0.0–0.1)
Immature Granulocytes: 0 %
Iron Saturation: 13 % — ABNORMAL LOW (ref 15–55)
Iron: 56 ug/dL (ref 27–139)
Lymphocytes Absolute: 2 10*3/uL (ref 0.7–3.1)
Lymphs: 37 %
MCH: 29.4 pg (ref 26.6–33.0)
MCHC: 33 g/dL (ref 31.5–35.7)
MCV: 89 fL (ref 79–97)
Monocytes Absolute: 0.3 10*3/uL (ref 0.1–0.9)
Monocytes: 5 %
Neutrophils Absolute: 2.9 10*3/uL (ref 1.4–7.0)
Neutrophils: 55 %
Platelets: 396 10*3/uL (ref 150–450)
RBC: 3.61 x10E6/uL — ABNORMAL LOW (ref 3.77–5.28)
RDW: 12.7 % (ref 11.7–15.4)
Retic Ct Pct: 1 % (ref 0.6–2.6)
Total Iron Binding Capacity: 427 ug/dL (ref 250–450)
UIBC: 371 ug/dL — ABNORMAL HIGH (ref 118–369)
Vitamin B-12: 476 pg/mL (ref 232–1245)
WBC: 5.3 10*3/uL (ref 3.4–10.8)

## 2024-03-08 LAB — LIPID PANEL
Chol/HDL Ratio: 4.2 ratio (ref 0.0–4.4)
Cholesterol, Total: 172 mg/dL (ref 100–199)
HDL: 41 mg/dL (ref >39)
LDL Chol Calc (NIH): 110 mg/dL — ABNORMAL HIGH (ref 0–99)
Triglycerides: 117 mg/dL (ref 0–149)
VLDL Cholesterol Cal: 21 mg/dL (ref 5–40)

## 2024-03-09 ENCOUNTER — Encounter: Payer: Self-pay | Admitting: Family Medicine

## 2024-03-18 ENCOUNTER — Other Ambulatory Visit: Payer: Self-pay | Admitting: Family Medicine

## 2024-03-28 ENCOUNTER — Encounter: Payer: Self-pay | Admitting: Family Medicine

## 2024-03-28 MED ORDER — VALACYCLOVIR HCL 1 G PO TABS: 1000.0000 mg | ORAL_TABLET | Freq: Two times a day (BID) | ORAL | 0 refills | Status: AC

## 2024-03-28 NOTE — Telephone Encounter (Signed)
Ok to send Valtrex? 

## 2024-04-23 ENCOUNTER — Other Ambulatory Visit: Payer: Self-pay | Admitting: Family Medicine

## 2024-04-25 ENCOUNTER — Ambulatory Visit: Payer: Medicare HMO

## 2024-04-25 VITALS — BP 140/75 | HR 60 | Ht 63.0 in | Wt 132.0 lb

## 2024-04-25 DIAGNOSIS — Z0000 Encounter for general adult medical examination without abnormal findings: Secondary | ICD-10-CM | POA: Diagnosis not present

## 2024-04-25 NOTE — Patient Instructions (Signed)
 Melissa Bowman , Thank you for taking time out of your busy schedule to complete your Annual Wellness Visit with me. I enjoyed our conversation and look forward to speaking with you again next year. I, as well as your care team,  appreciate your ongoing commitment to your health goals. Please review the following plan we discussed and let me know if I can assist you in the future. Your Game plan/ To Do List   Follow up Visits: Next Medicare AWV with our clinical staff: Friday,04/26/25 at 1:50p.m.   Have you seen your provider in the last 6 months (3 months if uncontrolled diabetes)? Yes Next Office Visit with your provider: 09/07/24 At 9:30a.m.  Clinician Recommendations:  Aim for 30 minutes of exercise or brisk walking, 6-8 glasses of water, and 5 servings of fruits and vegetables each day. Please remember to discuss the following with your provider: Foot exam and Hepatitis C screening is due at your next visit. If you have any questions please contact our office at [phone].      This is a list of the screening recommended for you and due dates:  Health Maintenance  Topic Date Due   Hepatitis C Screening  Never done   COVID-19 Vaccine (4 - 2024-25 season) 08/14/2023   Complete foot exam   03/08/2024   Mammogram  06/07/2024   DEXA scan (bone density measurement)  06/25/2024   Flu Shot  07/13/2024   Eye exam for diabetics  07/18/2024   Hemoglobin A1C  09/07/2024   Yearly kidney health urinalysis for diabetes  09/11/2024   DTaP/Tdap/Td vaccine (2 - Td or Tdap) 09/27/2024   Yearly kidney function blood test for diabetes  01/11/2025   Medicare Annual Wellness Visit  04/25/2025   Colon Cancer Screening  07/09/2028   Pneumonia Vaccine  Completed   Zoster (Shingles) Vaccine  Completed   HPV Vaccine  Aged Out   Meningitis B Vaccine  Aged Out    Advanced directives: (Declined) Advance directive discussed with you today. Even though you declined this today, please call our office should you  change your mind, and we can give you the proper paperwork for you to fill out. Advance Care Planning is important because it:  [x]  Makes sure you receive the medical care that is consistent with your values, goals, and preferences  [x]  It provides guidance to your family and loved ones and reduces their decisional burden about whether or not they are making the right decisions based on your wishes.  Follow the link provided in your after visit summary or read over the paperwork we have mailed to you to help you started getting your Advance Directives in place. If you need assistance in completing these, please reach out to us  so that we can help you!  See attachments for Preventive Care and Fall Prevention Tips.

## 2024-04-25 NOTE — Progress Notes (Signed)
 Subjective:   Melissa Bowman is a 73 y.o. who presents for a Medicare Wellness preventive visit.  As a reminder, Annual Wellness Visits don't include a physical exam, and some assessments may be limited, especially if this visit is performed virtually. We may recommend an in-person visit if needed.  Visit Complete: Virtual I connected with  Melissa Bowman on 04/25/24 by a audio enabled telemedicine application and verified that I am speaking with the correct person using two identifiers.  Patient Location: Home  Provider Location: Home Office  I discussed the limitations of evaluation and management by telemedicine. The patient expressed understanding and agreed to proceed.  Vital Signs: Because this visit was a virtual/telehealth visit, some criteria may be missing or patient reported. Any vitals not documented were not able to be obtained and vitals that have been documented are patient reported.  VideoDeclined- This patient declined Librarian, academic. Therefore the visit was completed with audio only.  Persons Participating in Visit: Patient.  AWV Questionnaire: No: Patient Medicare AWV questionnaire was not completed prior to this visit.  Cardiac Risk Factors include: advanced age (>52men, >43 women);dyslipidemia;hypertension;diabetes mellitus     Objective:     Today's Vitals   04/25/24 1434  BP: (!) 140/75  Pulse: 60  Weight: 132 lb (59.9 kg)  Height: 5\' 3"  (1.6 m)   Body mass index is 23.38 kg/m.     04/25/2024    2:47 PM 01/10/2024    5:24 PM 12/26/2023    2:26 PM 04/25/2023    3:45 PM 04/23/2022    4:22 PM 04/22/2021    1:30 PM 03/04/2020    8:32 AM  Advanced Directives  Does Patient Have a Medical Advance Directive? No No No No No No No  Would patient like information on creating a medical advance directive?  No - Patient declined No - Patient declined No - Patient declined No - Patient declined No - Patient declined No - Patient  declined    Current Medications (verified) Outpatient Encounter Medications as of 04/25/2024  Medication Sig   Ascorbic Acid  (VITAMIN C ) 1000 MG tablet Take 1,000 mg by mouth in the morning.   CALCIUM  PO Take 1,200 mg by mouth in the morning.   Carboxymethylcellulose Sodium 0.25 % SOLN Place 1-2 drops into both eyes as needed (dry eyes).   Cholecalciferol  (VITAMIN D -3) 5000 UNITS TABS Take 5,000 Units by mouth in the morning.   desloratadine  (CLARINEX ) 5 MG tablet Take 5 mg by mouth in the morning.   famotidine  (PEPCID ) 20 MG tablet TAKE ONE TABLET BY MOUTH AT BEDTIME   fenofibrate  160 MG tablet TAKE 1 TABLET BY MOUTH EVERY DAY   glycerin adult 2 g suppository Place 1 suppository rectally as needed for constipation.   ibuprofen (ADVIL) 200 MG tablet Take 200-400 mg by mouth every 8 (eight) hours as needed (pain.).   irbesartan -hydrochlorothiazide  (AVALIDE) 150-12.5 MG tablet TAKE 1 TABLET BY MOUTH EVERY DAY   melatonin 10 MG TABS Take 10 mg by mouth at bedtime.   memantine  (NAMENDA ) 5 MG tablet Take 1 tablet (5 mg total) by mouth 2 (two) times daily.   mirtazapine  (REMERON ) 45 MG tablet Take 1 tablet (45 mg total) by mouth at bedtime.   montelukast  (SINGULAIR ) 10 MG tablet Take 1 tablet (10 mg total) by mouth daily. (Patient taking differently: Take 10 mg by mouth at bedtime.)   ondansetron  (ZOFRAN -ODT) 4 MG disintegrating tablet Take 1 tablet (4 mg total) by mouth  every 8 (eight) hours as needed for nausea or vomiting.   pantoprazole  (PROTONIX ) 40 MG tablet TAKE 1 TABLET 2 TIMES A DAY BEFORE A MEAL   PROAIR  HFA 108 (90 BASE) MCG/ACT inhaler Use 2 puffs into the lungs  every 6 hours as needed for wheezing or shortness of  breath (Patient taking differently: Inhale 2 puffs into the lungs every 6 (six) hours as needed for wheezing or shortness of breath.)   sertraline  (ZOLOFT ) 50 MG tablet TAKE ONE TABLET BY MOUTH DAILY   No facility-administered encounter medications on file as of 04/25/2024.     Allergies (verified) Statins, E-mycin [erythromycin], Estrogens, Sulfa antibiotics, and Vioxx [rofecoxib]   History: Past Medical History:  Diagnosis Date   Allergic rhinitis    Allergy to ertapenem    Anxiety 08/27/2011   Anxiety and depression    Asthmatic bronchitis    Bilateral carpal tunnel syndrome    Condyloma acuminata    Diverticulosis    Eczema    GERD (gastroesophageal reflux disease)    H. pylori infection 11/16/1993   Hiatal hernia    History of benign adrenal tumor 2007   [facility]   History of pneumonia 2007, 2008   Hyperlipidemia    Hypertension    IBS (irritable bowel syndrome)    Internal hemorrhoids    Kyphosis    Long term current use of antipsychotic medication 08/19/2022   Memory loss    Menopause    Metabolic syndrome    OSA on CPAP    Prolonged Q-T interval on ECG 08/19/2022   Type 2 diabetes mellitus (HCC)    Vitamin D  deficiency    Past Surgical History:  Procedure Laterality Date   abdominal wall lematoma of fabdd hys.     APPENDECTOMY     CARPAL TUNNEL RELEASE     CESAREAN SECTION     X2   COLONOSCOPY     INCISIONAL HERNIA REPAIR     ORIF HUMERUS FRACTURE Left 12/26/2023   Procedure: OPEN REDUCTION INTERNAL FIXATION (ORIF) HUMERUS FRACTURE;  Surgeon: Janeth Medicus, MD;  Location: [facility] OR;  Service: Orthopedics;  Laterality: Left;  90   right breast biopsy  09/1995   ROTATOR CUFF REPAIR  2002   right   ROTATOR CUFF REPAIR/ bone spur  08/30/2009   SHOULDER ARTHROSCOPY WITH ROTATOR CUFF REPAIR AND SUBACROMIAL DECOMPRESSION Right 01/19/2018   Procedure: REVISION RIGHT MINI OPEN ROTATOR CUFF REPAIR AND SUBACROMIAL DECOMPRESSION;  Surgeon: Orvan Blanch, MD;  Location: [facility] ORS;  Service: Orthopedics;  Laterality: Right;  90 MINS   sleep apnea surgery  04/1995   torn cartiledge rt knee x2  05/13/2006   Dr. Genevive Ket Smoc    TOTAL ABDOMINAL HYSTERECTOMY W/ BILATERAL SALPINGOOPHORECTOMY  07/1993   fibroids    Family History  Problem  Relation Age of Onset   Heart disease Mother    Parkinsonism Father    Emphysema Father    Hip fracture Father    Heart failure Sister    Ovarian cancer Sister    COPD Sister    Lymphoma Sister    COPD Sister    COPD Brother    Hypertension Brother    Heart disease Brother    COPD Brother    Colon cancer Neg Hx    Esophageal cancer Neg Hx    Pancreatic cancer Neg Hx    Stomach cancer Neg Hx    Breast cancer Neg Hx    Social History   Socioeconomic History  Marital status: Married    Spouse name: [name]   Number of children: 2   Years of education: some college   Highest education level: Some college, no degree  Occupational History   Occupation: retired    Comment: daycare  Tobacco Use   Smoking status: Never   Smokeless tobacco: Never  Vaping Use   Vaping status: Never Used  Substance and Sexual Activity   Alcohol  use: No   Drug use: No   Sexual activity: Not on file  Other Topics Concern   Not on file  Social History Narrative   Michela is retired. She lives with her husband [name]. They have two grown daughters that live locally. Burlie enjoys reading and puzzles.       Social Drivers of Corporate investment banker Strain: Low Risk  (04/25/2024)   Overall Financial Resource Strain (CARDIA)    Difficulty of Paying Living Expenses: Not hard at all  Food Insecurity: No Food Insecurity (04/25/2024)   Hunger Vital Sign    Worried About Running Out of Food in the Last Year: Never true    Ran Out of Food in the Last Year: Never true  Transportation Needs: No Transportation Needs (01/20/2024)   [facility] from [facility]    Lack of Transportation (Medical): No    Lack of Transportation (Non-Medical): No  Physical Activity: Insufficiently Active (04/25/2024)   Exercise Vital Sign    Days of Exercise per Week: 7 days    Minutes of Exercise per Session: 20 min  Stress: No Stress Concern Present (04/25/2024)   [employer] of  Occupational Health - Occupational Stress Questionnaire    Feeling of Stress : Not at all  Social Connections: Moderately Isolated (04/25/2024)   Social Connection and Isolation Panel [NHANES]    Frequency of Communication with Friends and Family: More than three times a week    Frequency of Social Gatherings with Friends and Family: More than three times a week    Attends Religious Services: Never    Database administrator or Organizations: No    Attends Engineer, structural: Never    Marital Status: Married    Tobacco Counseling Counseling given: Yes    Clinical Intake:  Pre-visit preparation completed: Yes  Pain : No/denies pain     BMI - recorded: 23.38 Nutritional Status: BMI of 19-24  Normal Nutritional Risks: None Diabetes: Yes  Lab Results  Component Value Date   HGBA1C 5.0 03/07/2024   HGBA1C 5.0 09/12/2023   HGBA1C 5.3 03/09/2023     How often do you need to have someone help you when you read instructions, pamphlets, or other written materials from your doctor or pharmacy?: 1 - Never  Interpreter Needed?: No  Comments: During our awv due to pt's hearing even w/hearing aids, pt had a really hard time hearing ie had to repeat quesitons a couple times. I would recommend next awv to be face to face. it will be better for pt. alia t/cma Information entered by :: Alia T/cma   Activities of Daily Living     04/25/2024    2:43 PM 12/26/2023    2:31 PM  In your present state of health, do you have any difficulty performing the following activities:  Hearing? 1 1  Vision? 0 0  Difficulty concentrating or making decisions? 0 0  Walking or climbing stairs? 0   Dressing or bathing? 0   Doing errands, shopping? 0  Preparing Food and eating ? N   Using the Toilet? N   In the past six months, have you accidently leaked urine? N   Do you have problems with loss of bowel control? N   Managing your Medications? N   Managing your Finances? N   Housekeeping  or managing your Housekeeping? N     Patient Care Team: Eliodoro Guerin, DO as PCP - General (Family Medicine) Gerard Knight, MD as PCP - Cardiology (Cardiology) Orvan Blanch, MD as Consulting Physician (Orthopedic Surgery) Madie Schilling, MD as Consulting Physician (Psychiatry)  Indicate any recent Medical Services you may have received from other than [facility] providers in the past year (date may be approximate).     Assessment:    This is a routine wellness examination for Melissa Bowman.  Hearing/Vision screen Hearing Screening - Comments:: Pt uses hearing aids/ still has a hard time hearing Vision Screening - Comments:: Pt wear glasses; Pt goes to [facility] [place], [place]   Goals Addressed               This Visit's Progress     patiet stated (pt-stated)        Stayig in touch w/families/be safe       Depression Screen     04/25/2024    2:55 PM 03/07/2024    9:26 AM 01/27/2024   11:13 AM 09/12/2023    9:16 AM 04/25/2023    3:44 PM 04/18/2023    9:54 AM 03/09/2023    9:25 AM  PHQ 2/9 Scores  PHQ - 2 Score 0 0 0 0 0 0 0  PHQ- 9 Score 0 0 0 0 0 0 0    Fall Risk     04/25/2024    2:42 PM 03/07/2024    9:26 AM 01/27/2024   11:13 AM 09/12/2023    9:16 AM 04/25/2023    3:43 PM  Fall Risk   Falls in the past year? 1 1 1  0 0  Number falls in past yr: 0 0 1 0 0  Injury with Fall? 1 1 1  0 0  Risk for fall due to : Impaired balance/gait;Impaired mobility Impaired mobility;Impaired vision Impaired vision;Impaired mobility No Fall Risks No Fall Risks  Follow up Falls evaluation completed;Education provided;Falls prevention discussed Falls evaluation completed Falls evaluation completed Education provided Falls prevention discussed    MEDICARE RISK AT HOME:  Medicare Risk at Home Any stairs in or around the home?: Yes If so, are there any without handrails?: Yes Home free of loose throw rugs in walkways, pet beds, electrical cords, etc?: Yes Adequate lighting in your  home to reduce risk of falls?: Yes Life alert?: No Use of a cane, walker or w/c?: No Grab bars in the bathroom?: No Shower chair or bench in shower?: Yes Elevated toilet seat or a handicapped toilet?: No  TIMED UP AND GO:  Was the test performed?  no  Cognitive Function: 6CIT completed    02/07/2019    2:03 PM  MMSE - Mini Mental State Exam  Orientation to time 5  Orientation to Place 5  Registration 3  Attention/ Calculation 5  Recall 3  Language- name 2 objects 2  Language- repeat 1  Language- follow 3 step command 3  Language- read & follow direction 1  Write a sentence 1  Copy design 1  Total score 30        04/25/2024    2:53 PM 04/25/2023  3:45 PM 04/23/2022    4:15 PM 03/04/2020    8:36 AM  6CIT Screen  What Year? 0 points 0 points 0 points 0 points  What month? 0 points 0 points 0 points 0 points  What time? 0 points 0 points 0 points 0 points  Count back from 20 0 points 0 points 0 points 0 points  Months in reverse 0 points 0 points 0 points 0 points  Repeat phrase 0 points 0 points 2 points 0 points  Total Score 0 points 0 points 2 points 0 points    Immunizations Immunization History  Administered Date(s) Administered   Fluad Quad(high Dose 65+) 10/30/2019, 09/02/2020, 12/01/2021   Fluad Trivalent(High Dose 65+) 09/12/2023   Influenza, High Dose Seasonal PF 10/06/2018   Influenza,inj,Quad PF,6+ Mos 09/28/2013, 09/27/2014, 10/15/2015, 11/03/2016   Moderna Sars-Covid-2 Vaccination 02/26/2020, 03/25/2020, 11/04/2020   PPD Test 01/17/2024   Pneumococcal Conjugate-13 05/25/2018   Pneumococcal Polysaccharide-23 10/30/2019   Tdap 09/27/2014   Zoster Recombinant(Shingrix ) 02/07/2019, 08/03/2021   Zoster, Live 01/31/2013    Screening Tests Health Maintenance  Topic Date Due   Hepatitis C Screening  Never done   COVID-19 Vaccine (4 - 2024-25 season) 08/14/2023   FOOT EXAM  03/08/2024   MAMMOGRAM  06/07/2024   DEXA SCAN  06/25/2024   INFLUENZA  VACCINE  07/13/2024   OPHTHALMOLOGY EXAM  07/18/2024   HEMOGLOBIN A1C  09/07/2024   Diabetic kidney evaluation - Urine ACR  09/11/2024   DTaP/Tdap/Td (2 - Td or Tdap) 09/27/2024   Diabetic kidney evaluation - eGFR measurement  01/11/2025   Medicare Annual Wellness (AWV)  04/25/2025   Colonoscopy  07/09/2028   Pneumonia Vaccine 47+ Years old  Completed   Zoster Vaccines- Shingrix   Completed   HPV VACCINES  Aged Out   Meningococcal B Vaccine  Aged Out    Health Maintenance  Health Maintenance Due  Topic Date Due   Hepatitis C Screening  Never done   COVID-19 Vaccine (4 - 2024-25 season) 08/14/2023   FOOT EXAM  03/08/2024   Health Maintenance Items Addressed: See Nurse Notes  Additional Screening:  Vision Screening: Recommended annual ophthalmology exams for early detection of glaucoma and other disorders of the eye.  Dental Screening: Recommended annual dental exams for proper oral hygiene  Community Resource Referral / Chronic Care Management: CRR required this visit?  No   CCM required this visit?  No   Plan:    I have personally reviewed and noted the following in the patient's chart:   Medical and social history Use of alcohol , tobacco or illicit drugs  Current medications and supplements including opioid prescriptions. Patient is not currently taking opioid prescriptions. Functional ability and status Nutritional status Physical activity Advanced directives List of other physicians Hospitalizations, surgeries, and ER visits in previous 12 months Vitals Screenings to include cognitive, depression, and falls Referrals and appointments  In addition, I have reviewed and discussed with patient certain preventive protocols, quality metrics, and best practice recommendations. A written personalized care plan for preventive services as well as general preventive health recommendations were provided to patient.   Michaelle Adolphus, CMA   04/25/2024   After Visit  Summary: (MyChart) Due to this being a telephonic visit, the after visit summary with patients personalized plan was offered to patient via MyChart   Notes: FYI TO PCP: Pt is due Foot exam and hepatitis screening at next visit.

## 2024-04-27 MED ORDER — PANTOPRAZOLE SODIUM 40 MG PO TBEC: 40.0000 mg | DELAYED_RELEASE_TABLET | Freq: Two times a day (BID) | ORAL | 4 refills | Status: DC

## 2024-05-04 ENCOUNTER — Telehealth (INDEPENDENT_AMBULATORY_CARE_PROVIDER_SITE_OTHER): Payer: Self-pay | Admitting: Psychiatry

## 2024-05-04 ENCOUNTER — Encounter (HOSPITAL_COMMUNITY): Payer: Self-pay

## 2024-05-04 DIAGNOSIS — Z91199 Patient's noncompliance with other medical treatment and regimen due to unspecified reason: Secondary | ICD-10-CM

## 2024-05-04 MED ORDER — MIRTAZAPINE 45 MG PO TABS: 45.0000 mg | ORAL_TABLET | Freq: Every day | ORAL | 2 refills | Status: AC

## 2024-05-04 MED ORDER — SERTRALINE HCL 50 MG PO TABS: 50.0000 mg | ORAL_TABLET | Freq: Every day | ORAL | 2 refills | Status: AC

## 2024-05-04 NOTE — Progress Notes (Signed)
 Patient not present for video appointment today. Refills sent to pharmacy.

## 2024-05-23 ENCOUNTER — Other Ambulatory Visit: Payer: Self-pay | Admitting: Family Medicine

## 2024-05-27 ENCOUNTER — Other Ambulatory Visit: Payer: Self-pay | Admitting: Family Medicine

## 2024-05-27 DIAGNOSIS — J309 Allergic rhinitis, unspecified: Secondary | ICD-10-CM

## 2024-05-27 DIAGNOSIS — G3184 Mild cognitive impairment, so stated: Secondary | ICD-10-CM

## 2024-06-07 DIAGNOSIS — G4733 Obstructive sleep apnea (adult) (pediatric): Secondary | ICD-10-CM | POA: Diagnosis not present

## 2024-07-16 ENCOUNTER — Encounter: Payer: Self-pay | Admitting: Family Medicine

## 2024-07-16 ENCOUNTER — Other Ambulatory Visit: Payer: Self-pay | Admitting: Family Medicine

## 2024-07-16 MED ORDER — DESLORATADINE 5 MG PO TABS: 5.0000 mg | ORAL_TABLET | Freq: Every morning | ORAL | 4 refills | Status: DC

## 2024-07-24 ENCOUNTER — Encounter: Payer: Self-pay | Admitting: Family Medicine

## 2024-07-25 ENCOUNTER — Other Ambulatory Visit: Payer: Self-pay | Admitting: Family Medicine

## 2024-07-25 DIAGNOSIS — F331 Major depressive disorder, recurrent, moderate: Secondary | ICD-10-CM

## 2024-07-25 NOTE — Progress Notes (Signed)
 Orders Placed This Encounter  Procedures  . Ambulatory referral to Psychiatry    Referral Priority:   Urgent    Referral Type:   Psychiatric    Referral Reason:   Specialty Services Required    Requested Specialty:   Psychiatry    Number of Visits Requested:   1

## 2024-07-30 ENCOUNTER — Encounter: Payer: Self-pay | Admitting: Family Medicine

## 2024-07-30 NOTE — Telephone Encounter (Signed)
 Please get her set up for a nurse visit for manual BP check.

## 2024-07-31 ENCOUNTER — Ambulatory Visit (INDEPENDENT_AMBULATORY_CARE_PROVIDER_SITE_OTHER)

## 2024-07-31 VITALS — BP 148/88

## 2024-07-31 DIAGNOSIS — E1159 Type 2 diabetes mellitus with other circulatory complications: Secondary | ICD-10-CM

## 2024-07-31 NOTE — Progress Notes (Signed)
 BP at goal for age. Recommend if having persistent headaches, she see neurology for discussion of options.

## 2024-08-01 ENCOUNTER — Ambulatory Visit (INDEPENDENT_AMBULATORY_CARE_PROVIDER_SITE_OTHER): Admitting: Family Medicine

## 2024-08-01 ENCOUNTER — Ambulatory Visit (INDEPENDENT_AMBULATORY_CARE_PROVIDER_SITE_OTHER)

## 2024-08-01 ENCOUNTER — Encounter: Payer: Self-pay | Admitting: Family Medicine

## 2024-08-01 VITALS — BP 196/92 | HR 52 | Temp 97.6°F | Ht 60.0 in | Wt 140.2 lb

## 2024-08-01 DIAGNOSIS — E1159 Type 2 diabetes mellitus with other circulatory complications: Secondary | ICD-10-CM

## 2024-08-01 DIAGNOSIS — I152 Hypertension secondary to endocrine disorders: Secondary | ICD-10-CM

## 2024-08-01 DIAGNOSIS — I10 Essential (primary) hypertension: Secondary | ICD-10-CM

## 2024-08-01 MED ORDER — IRBESARTAN-HYDROCHLOROTHIAZIDE 300-12.5 MG PO TABS: 1.0000 | ORAL_TABLET | Freq: Every day | ORAL | 3 refills | Status: AC

## 2024-08-01 NOTE — Progress Notes (Signed)
 Patient is in office today for a nurse visit for Blood Pressure Check. Patient blood pressure was 188/78, Patient No chest pain, No shortness of breath, No dyspnea on exertion, No orthopnea, No paroxysmal nocturnal dyspnea, No edema, No palpitations, No syncope, Patient states that she has been having headaches.

## 2024-08-01 NOTE — Progress Notes (Signed)
 Subjective: CC:HTN PCP: Melissa Bowman HERO, DO YEP:Melissa Bowman is a 73 y.o. female who is accompanied today by her spouse.  [name]:  Discussed the use of AI scribe software for clinical note transcription with the patient, who gave verbal consent to proceed.  History of Present Illness   Melissa Bowman is a 73 year old female with hypertension who presents with elevated blood pressure and headaches.  For the past two weeks, she has experienced elevated blood pressure readings at home, consistent with those observed during the visit. She reports feeling 'rotten' and has had a persistent headache for the same duration.  She is currently taking Avalide, which contains irbesartan  and a diuretic, and adheres to her medication regimen without any missed doses. There have been no changes in her medication, including the manufacturer or appearance of the pills.  She denies any changes in her diet, specifically salt intake, and reports a lack of taste for the past seventeen months, which has led to significant weight loss of seventy pounds. Despite this, she notes a recent weight gain of four pounds according to her home scale.  No symptoms of fluid retention. She is not taking aspirin . She has noticed blood spots on her skin, which she attributes to thinning skin and weight loss.      ROS: Per HPI  Allergies  Allergen Reactions   Statins Other (See Comments)    myalgias   E-Mycin [Erythromycin] Other (See Comments)    Medication intolerance stomach cramps   Estrogens Other (See Comments)    Estrogen replacement Therapy intolerance mood changes   Sulfa Antibiotics Other (See Comments)    Causes severe stomach cramps    Vioxx [Rofecoxib] Other (See Comments)    Unknown reaction/interaction. Possible stomach cramps   Past Medical History:  Diagnosis Date   Allergic rhinitis    Allergy to ertapenem    Anxiety 08/27/2011   Anxiety and  depression    Asthmatic bronchitis    Bilateral carpal tunnel syndrome    Condyloma acuminata    Diverticulosis    Eczema    GERD (gastroesophageal reflux disease)    H. pylori infection 11/16/1993   Hiatal hernia    History of benign adrenal tumor 2007   [facility]   History of pneumonia 2007, 2008   Hyperlipidemia    Hypertension    IBS (irritable bowel syndrome)    Internal hemorrhoids    Kyphosis    Long term current use of antipsychotic medication 08/19/2022   Memory loss    Menopause    Metabolic syndrome    OSA on CPAP    Prolonged Q-T interval on ECG 08/19/2022   Type 2 diabetes mellitus (HCC)    Vitamin D  deficiency     Current Outpatient Medications:    Ascorbic Acid  (VITAMIN C ) 1000 MG tablet, Take 1,000 mg by mouth in the morning., Disp: , Rfl:    CALCIUM  PO, Take 1,200 mg by mouth in the morning., Disp: , Rfl:    Carboxymethylcellulose Sodium 0.25 % SOLN, Place 1-2 drops into both eyes as needed (dry eyes)., Disp: , Rfl:    Cholecalciferol  (VITAMIN D -3) 5000 UNITS TABS, Take 5,000 Units by mouth in the morning., Disp: , Rfl:    desloratadine  (CLARINEX ) 5 MG tablet, Take 1 tablet (5 mg total) by mouth in the morning., Disp: 90 tablet, Rfl: 4   famotidine  (PEPCID ) 20 MG tablet, TAKE ONE TABLET BY MOUTH AT BEDTIME,  Disp: 90 tablet, Rfl: 1   fenofibrate  160 MG tablet, TAKE 1 TABLET BY MOUTH EVERY DAY, Disp: 90 tablet, Rfl: 1   glycerin adult 2 g suppository, Place 1 suppository rectally as needed for constipation., Disp: , Rfl:    ibuprofen (ADVIL) 200 MG tablet, Take 200-400 mg by mouth every 8 (eight) hours as needed (pain.)., Disp: , Rfl:    irbesartan -hydrochlorothiazide  (AVALIDE) 300-12.5 MG tablet, Take 1 tablet by mouth daily. For BP. **dose change, Disp: 90 tablet, Rfl: 3   melatonin 10 MG TABS, Take 10 mg by mouth at bedtime., Disp: , Rfl:    memantine  (NAMENDA ) 5 MG tablet, TAKE 1 TABLET BY MOUTH TWICE A DAY, Disp: 180 tablet, Rfl: 1   mirtazapine  (REMERON ) 45 MG  tablet, Take 1 tablet (45 mg total) by mouth at bedtime., Disp: 90 tablet, Rfl: 2   montelukast  (SINGULAIR ) 10 MG tablet, Take 1 tablet (10 mg total) by mouth daily. (Patient taking differently: Take 10 mg by mouth at bedtime.), Disp: 100 tablet, Rfl: 3   ondansetron  (ZOFRAN -ODT) 4 MG disintegrating tablet, Take 1 tablet (4 mg total) by mouth every 8 (eight) hours as needed for nausea or vomiting., Disp: 20 tablet, Rfl: 0   pantoprazole  (PROTONIX ) 40 MG tablet, Take 1 tablet (40 mg total) by mouth 2 (two) times daily before a meal., Disp: 60 tablet, Rfl: 4   PROAIR  HFA 108 (90 BASE) MCG/ACT inhaler, Use 2 puffs into the lungs  every 6 hours as needed for wheezing or shortness of  breath (Patient taking differently: Inhale 2 puffs into the lungs every 6 (six) hours as needed for wheezing or shortness of breath.), Disp: 25.5 g, Rfl: 2   sertraline  (ZOLOFT ) 50 MG tablet, Take 1 tablet (50 mg total) by mouth daily., Disp: 90 tablet, Rfl: 2 Social History   Socioeconomic History   Marital status: Married    Spouse name: [name]   Number of children: 2   Years of education: some college   Highest education level: Some college, no degree  Occupational History   Occupation: retired    Comment: daycare  Tobacco Use   Smoking status: Never   Smokeless tobacco: Never  Vaping Use   Vaping status: Never Used  Substance and Sexual Activity   Alcohol  use: No   Drug use: No   Sexual activity: Not on file  Other Topics Concern   Not on file  Social History Narrative   Melissa Bowman is retired. She lives with her husband [name]. They have two grown daughters that live locally. Melissa Bowman enjoys reading and puzzles.       Social Drivers of Corporate investment banker Strain: Low Risk  (04/25/2024)   Overall Financial Resource Strain (CARDIA)    Difficulty of Paying Living Expenses: Not hard at all  Food Insecurity: No Food Insecurity (04/25/2024)   Hunger Vital Sign    Worried About Running Out of Food in the  Last Year: Never true    Ran Out of Food in the Last Year: Never true  Transportation Needs: No Transportation Needs (01/20/2024)   Received from [facility] - Transportation    Lack of Transportation (Medical): No    Lack of Transportation (Non-Medical): No  Physical Activity: Insufficiently Active (04/25/2024)   Exercise Vital Sign    Days of Exercise per Week: 7 days    Minutes of Exercise per Session: 20 min  Stress: No Stress Concern Present (04/25/2024)   [employer] of  Occupational Health - Occupational Stress Questionnaire    Feeling of Stress : Not at all  Social Connections: Moderately Isolated (04/25/2024)   Social Connection and Isolation Panel    Frequency of Communication with Friends and Family: More than three times a week    Frequency of Social Gatherings with Friends and Family: More than three times a week    Attends Religious Services: Never    Database administrator or Organizations: No    Attends Banker Meetings: Never    Marital Status: Married  Catering manager Violence: Unknown (01/11/2024)   Humiliation, Afraid, Rape, and Kick questionnaire    Fear of Current or Ex-Partner: Patient declined    Emotionally Abused: Patient declined    Physically Abused: Patient declined    Sexually Abused: Not on file   Family History  Problem Relation Age of Onset   Heart disease Mother    Parkinsonism Father    Emphysema Father    Hip fracture Father    Heart failure Sister    Ovarian cancer Sister    COPD Sister    Lymphoma Sister    COPD Sister    COPD Brother    Hypertension Brother    Heart disease Brother    COPD Brother    Colon cancer Neg Hx    Esophageal cancer Neg Hx    Pancreatic cancer Neg Hx    Stomach cancer Neg Hx    Breast cancer Neg Hx     Objective: Office vital signs reviewed. BP (!) 196/92   Pulse (!) 52   Temp 97.6 F (36.4 C)   Ht 5' (1.524 m)   Wt 140 lb 3.2 oz (63.6 kg)   SpO2 94%   BMI 27.38 kg/m    Physical Examination:  General: Awake, alert, well nourished, No acute distress HEENT: sclera white, MMM Cardio: regular rate and rhythm  Pulm: clear to auscultation bilaterally, no wheezes, rhonchi or rales; normal work of breathing on room air MSK: Ambulating independently Neuro: 5/5 UE and LE Strength and light touch sensation grossly intact.  With exception of hearing and vision cranial nerves II through XII are grossly intact.  She wears hearing aids and glasses  Assessment/ Plan: 73 y.o. female   Hypertension associated with diabetes (HCC) - Plan: irbesartan -hydrochlorothiazide  (AVALIDE) 300-12.5 MG tablet, Basic metabolic panel with GFR  Accelerated hypertension - Plan: Basic metabolic panel with GFR    Hypertension with associated headache Hypertension elevated for two weeks with persistent headache. Home blood pressure readings consistently high. No stroke signs observed. - Increase Avalide dosage to double current dose. - Instruct to monitor blood pressure at home with goal of <150/90 mmHg. - Schedule follow-up in two weeks to recheck blood pressure. - Advised to watch for stroke-like symptoms and seek immediate medical attention if she occurs.    - BMP in 2 weeks   Melissa Bowman CHRISTELLA Fielding, DO [facility] [phone]

## 2024-08-15 ENCOUNTER — Other Ambulatory Visit: Payer: Self-pay | Admitting: Family Medicine

## 2024-08-15 ENCOUNTER — Other Ambulatory Visit (INDEPENDENT_AMBULATORY_CARE_PROVIDER_SITE_OTHER)

## 2024-08-15 ENCOUNTER — Encounter: Payer: Self-pay | Admitting: Family Medicine

## 2024-08-15 ENCOUNTER — Ambulatory Visit: Admitting: Family Medicine

## 2024-08-15 VITALS — BP 153/67 | HR 53 | Temp 98.1°F | Ht 60.0 in | Wt 141.5 lb

## 2024-08-15 DIAGNOSIS — Z23 Encounter for immunization: Secondary | ICD-10-CM | POA: Diagnosis not present

## 2024-08-15 DIAGNOSIS — Z13820 Encounter for screening for osteoporosis: Secondary | ICD-10-CM

## 2024-08-15 DIAGNOSIS — Z780 Asymptomatic menopausal state: Secondary | ICD-10-CM

## 2024-08-15 DIAGNOSIS — I152 Hypertension secondary to endocrine disorders: Secondary | ICD-10-CM

## 2024-08-15 DIAGNOSIS — E1159 Type 2 diabetes mellitus with other circulatory complications: Secondary | ICD-10-CM

## 2024-08-15 NOTE — Patient Instructions (Signed)

## 2024-08-15 NOTE — Progress Notes (Signed)
 Subjective: [name] HTN PCP: [name], DO YEP:Djwimj F [name] is a 73 y.o. female presenting to clinic today for:  Discussed the use of AI scribe software for clinical note transcription with the patient, who gave verbal consent to proceed.  History of Present Illness   Melissa Bowman is a 73 year old female with hypertension who presents for a blood pressure recheck.  She was last seen on August 20th, 2025, with an elevated blood pressure of 196/92 mmHg. At that time, her Avalide dosage was increased to 300 mg/12.5 mg. She is here for a follow-up to recheck her blood pressure.  Her current diet includes scrambled eggs with a little salt and pepper, a small amount of sausage, and a piece of toast for breakfast. For lunch, she consumes stir-fried noodles, which has 1/2 her daily intake of salt in it. She drinks four 20-ounce bottles of water daily and occasionally tea with sucralose. She does not usually snack but sometimes eats a small amount of ice cream at night. Dinner varies, but she mentions difficulty eating certain meats like pork chop and tenderloin due to texture, although she can eat steak. She uses a lot of salt, especially when eating [place] food.  She lost her sense of taste 17 months ago but is gradually regaining it.         ROS: Per HPI  Allergies  Allergen Reactions   Statins Other (See Comments)    myalgias   E-Mycin [Erythromycin] Other (See Comments)    Medication intolerance stomach cramps   Estrogens Other (See Comments)    Estrogen replacement Therapy intolerance mood changes   Sulfa Antibiotics Other (See Comments)    Causes severe stomach cramps    Vioxx [Rofecoxib] Other (See Comments)    Unknown reaction/interaction. Possible stomach cramps   Past Medical History:  Diagnosis Date   Allergic rhinitis    Allergy to ertapenem    Anxiety 08/27/2011   Anxiety and depression    Asthmatic bronchitis    Bilateral carpal tunnel  syndrome    Condyloma acuminata    Diverticulosis    Eczema    GERD (gastroesophageal reflux disease)    H. pylori infection 11/16/1993   Hiatal hernia    History of benign adrenal tumor 2007   [facility]   History of pneumonia 2007, 2008   Hyperlipidemia    Hypertension    IBS (irritable bowel syndrome)    Internal hemorrhoids    Kyphosis    Long term current use of antipsychotic medication 08/19/2022   Memory loss    Menopause    Metabolic syndrome    OSA on CPAP    Prolonged Q-T interval on ECG 08/19/2022   Type 2 diabetes mellitus (HCC)    Vitamin D  deficiency     Current Outpatient Medications:    Ascorbic Acid  (VITAMIN C ) 1000 MG tablet, Take 1,000 mg by mouth in the morning., Disp: , Rfl:    CALCIUM  PO, Take 1,200 mg by mouth in the morning., Disp: , Rfl:    Carboxymethylcellulose Sodium 0.25 % SOLN, Place 1-2 drops into both eyes as needed (dry eyes)., Disp: , Rfl:    Cholecalciferol  (VITAMIN D -3) 5000 UNITS TABS, Take 5,000 Units by mouth in the morning., Disp: , Rfl:    desloratadine  (CLARINEX ) 5 MG tablet, Take 1 tablet (5 mg total) by mouth in the morning., Disp: 90 tablet, Rfl: 4   famotidine  (PEPCID ) 20 MG tablet, TAKE ONE TABLET BY MOUTH AT  BEDTIME, Disp: 90 tablet, Rfl: 1   fenofibrate  160 MG tablet, TAKE 1 TABLET BY MOUTH EVERY DAY, Disp: 90 tablet, Rfl: 1   glycerin adult 2 g suppository, Place 1 suppository rectally as needed for constipation., Disp: , Rfl:    ibuprofen (ADVIL) 200 MG tablet, Take 200-400 mg by mouth every 8 (eight) hours as needed (pain.)., Disp: , Rfl:    irbesartan -hydrochlorothiazide  (AVALIDE) 300-12.5 MG tablet, Take 1 tablet by mouth daily. For BP. **dose change, Disp: 90 tablet, Rfl: 3   melatonin 10 MG TABS, Take 10 mg by mouth at bedtime., Disp: , Rfl:    memantine  (NAMENDA ) 5 MG tablet, TAKE 1 TABLET BY MOUTH TWICE A DAY, Disp: 180 tablet, Rfl: 1   mirtazapine  (REMERON ) 45 MG tablet, Take 1 tablet (45 mg total) by mouth at bedtime., Disp:  90 tablet, Rfl: 2   montelukast  (SINGULAIR ) 10 MG tablet, Take 1 tablet (10 mg total) by mouth daily. (Patient taking differently: Take 10 mg by mouth at bedtime.), Disp: 100 tablet, Rfl: 3   ondansetron  (ZOFRAN -ODT) 4 MG disintegrating tablet, Take 1 tablet (4 mg total) by mouth every 8 (eight) hours as needed for nausea or vomiting., Disp: 20 tablet, Rfl: 0   pantoprazole  (PROTONIX ) 40 MG tablet, Take 1 tablet (40 mg total) by mouth 2 (two) times daily before a meal., Disp: 60 tablet, Rfl: 4   PROAIR  HFA 108 (90 BASE) MCG/ACT inhaler, Use 2 puffs into the lungs  every 6 hours as needed for wheezing or shortness of  breath (Patient taking differently: Inhale 2 puffs into the lungs every 6 (six) hours as needed for wheezing or shortness of breath.), Disp: 25.5 g, Rfl: 2   sertraline  (ZOLOFT ) 50 MG tablet, Take 1 tablet (50 mg total) by mouth daily., Disp: 90 tablet, Rfl: 2 Social History   Socioeconomic History   Marital status: Married    Spouse name: [name]   Number of children: 2   Years of education: some college   Highest education level: Some college, no degree  Occupational History   Occupation: retired    Comment: daycare  Tobacco Use   Smoking status: Never   Smokeless tobacco: Never  Vaping Use   Vaping status: Never Used  Substance and Sexual Activity   Alcohol  use: No   Drug use: No   Sexual activity: Not on file  Other Topics Concern   Not on file  Social History Narrative   Taiyana is retired. She lives with her husband [name]. They have two grown daughters that live locally. Antonae enjoys reading and puzzles.       Social Drivers of Corporate investment banker Strain: Low Risk  (04/25/2024)   Overall Financial Resource Strain (CARDIA)    Difficulty of Paying Living Expenses: Not hard at all  Food Insecurity: No Food Insecurity (04/25/2024)   Hunger Vital Sign    Worried About Running Out of Food in the Last Year: Never true    Ran Out of Food in the Last Year:  Never true  Transportation Needs: No Transportation Needs (01/20/2024)   Received from [facility] - Transportation    Lack of Transportation (Medical): No    Lack of Transportation (Non-Medical): No  Physical Activity: Insufficiently Active (04/25/2024)   Exercise Vital Sign    Days of Exercise per Week: 7 days    Minutes of Exercise per Session: 20 min  Stress: No Stress Concern Present (04/25/2024)   [employer]  of Occupational Health - Occupational Stress Questionnaire    Feeling of Stress : Not at all  Social Connections: Moderately Isolated (04/25/2024)   Social Connection and Isolation Panel    Frequency of Communication with Friends and Family: More than three times a week    Frequency of Social Gatherings with Friends and Family: More than three times a week    Attends Religious Services: Never    Database administrator or Organizations: No    Attends Banker Meetings: Never    Marital Status: Married  Catering manager Violence: Unknown (01/11/2024)   Humiliation, Afraid, Rape, and Kick questionnaire    Fear of Current or Ex-Partner: Patient declined    Emotionally Abused: Patient declined    Physically Abused: Patient declined    Sexually Abused: Not on file   Family History  Problem Relation Age of Onset   Heart disease Mother    Parkinsonism Father    Emphysema Father    Hip fracture Father    Heart failure Sister    Ovarian cancer Sister    COPD Sister    Lymphoma Sister    COPD Sister    COPD Brother    Hypertension Brother    Heart disease Brother    COPD Brother    Colon cancer Neg Hx    Esophageal cancer Neg Hx    Pancreatic cancer Neg Hx    Stomach cancer Neg Hx    Breast cancer Neg Hx     Objective: Office vital signs reviewed. BP (!) 153/67   Pulse (!) 53   Temp 98.1 F (36.7 C)   Ht 5' (1.524 m)   Wt 141 lb 8 oz (64.2 kg)   SpO2 95%   BMI 27.63 kg/m   Physical Examination:  General: Awake, alert, well  nourished, No acute distress HEENT: sclera white, MMM Cardio: regular rate and rhythm, S1S2 heard, soft systolic murmurs appreciated Pulm: clear to auscultation bilaterally, no wheezes, rhonchi or rales; normal work of breathing on room air Extremities: warm, well perfused, No edema, cyanosis or clubbing; +2 pulses bilaterally    Assessment/ Plan: 73 y.o. female   Hypertension associated with diabetes (HCC)  Encounter for immunization - Plan: Flu vaccine HIGH DOSE PF(Fluzone Trivalent)  Assessment and Plan    Hypertension associated with DM Hypertension uncontrolled on maximum Avalide dose. High dietary sodium intake identified as a contributing factor. She was unaware of dietary salt content. - Advise reducing salt intake, particularly from stir-fried noodles and sausage. - Educate on low-salt alternatives, such as using chili garlic oil instead of high-salt seasonings. - Reinforce the importance of dietary changes to control blood pressure. - Consider adding Norvasc  2.5mg  if blood pressure remains uncontrolled despite dietary changes. - 2 week f/u with nurse for BP recheck.      Flu shot administered today  Norene CHRISTELLA Fielding, DO [facility] [phone]

## 2024-08-16 DIAGNOSIS — M8589 Other specified disorders of bone density and structure, multiple sites: Secondary | ICD-10-CM | POA: Diagnosis not present

## 2024-08-16 DIAGNOSIS — Z780 Asymptomatic menopausal state: Secondary | ICD-10-CM | POA: Diagnosis not present

## 2024-08-20 ENCOUNTER — Ambulatory Visit: Payer: Self-pay | Admitting: Family Medicine

## 2024-08-20 DIAGNOSIS — M8580 Other specified disorders of bone density and structure, unspecified site: Secondary | ICD-10-CM

## 2024-08-21 ENCOUNTER — Other Ambulatory Visit: Payer: Self-pay | Admitting: Gastroenterology

## 2024-09-03 ENCOUNTER — Ambulatory Visit (INDEPENDENT_AMBULATORY_CARE_PROVIDER_SITE_OTHER): Admitting: *Deleted

## 2024-09-03 DIAGNOSIS — I152 Hypertension secondary to endocrine disorders: Secondary | ICD-10-CM

## 2024-09-03 NOTE — Progress Notes (Signed)
 Patient seen for blood pressure check. 108/62  HR 54  and   109/64    HR 57.

## 2024-09-07 ENCOUNTER — Encounter: Payer: Self-pay | Admitting: Family Medicine

## 2024-09-07 ENCOUNTER — Ambulatory Visit: Admitting: Family Medicine

## 2024-09-07 VITALS — BP 155/70 | HR 51 | Temp 97.6°F | Ht 60.0 in | Wt 144.4 lb

## 2024-09-07 DIAGNOSIS — Z5181 Encounter for therapeutic drug level monitoring: Secondary | ICD-10-CM

## 2024-09-07 DIAGNOSIS — E785 Hyperlipidemia, unspecified: Secondary | ICD-10-CM

## 2024-09-07 DIAGNOSIS — M8580 Other specified disorders of bone density and structure, unspecified site: Secondary | ICD-10-CM | POA: Diagnosis not present

## 2024-09-07 DIAGNOSIS — E1169 Type 2 diabetes mellitus with other specified complication: Secondary | ICD-10-CM

## 2024-09-07 DIAGNOSIS — D509 Iron deficiency anemia, unspecified: Secondary | ICD-10-CM | POA: Diagnosis not present

## 2024-09-07 DIAGNOSIS — E1159 Type 2 diabetes mellitus with other circulatory complications: Secondary | ICD-10-CM

## 2024-09-07 DIAGNOSIS — I152 Hypertension secondary to endocrine disorders: Secondary | ICD-10-CM

## 2024-09-07 LAB — BAYER DCA HB A1C WAIVED: HB A1C (BAYER DCA - WAIVED): 4.6 % — ABNORMAL LOW (ref 4.8–5.6)

## 2024-09-07 MED ORDER — AMLODIPINE BESYLATE 2.5 MG PO TABS: 2.5000 mg | ORAL_TABLET | Freq: Every day | ORAL | 0 refills | Status: DC

## 2024-09-07 NOTE — Progress Notes (Signed)
 Subjective: CC:DM PCP: [name], DO YEP:Djwimj F Luba is a 73 y.o. female presenting to clinic today for:  Type 2 Diabetes with hypertension, hyperlipidemia:  Diet controlled diabetes but not really restricting carbohydrates.  She had her Avalide increased at last visit and we reinforced need for salt reduction.  She notes that she has mostly eliminated the prepackaged noodle meals but still occasionally eats them.  She continues to have processed meats like ham and sausage regularly.  She is not exercising.   Diabetes Health Maintenance Due  Topic Date Due   FOOT EXAM  03/08/2024   OPHTHALMOLOGY EXAM  07/18/2024   HEMOGLOBIN A1C  09/07/2024    ROS: Per HPI  Allergies  Allergen Reactions   Statins Other (See Comments)    myalgias   E-Mycin [Erythromycin] Other (See Comments)    Medication intolerance stomach cramps   Estrogens Other (See Comments)    Estrogen replacement Therapy intolerance mood changes   Sulfa Antibiotics Other (See Comments)    Causes severe stomach cramps    Vioxx [Rofecoxib] Other (See Comments)    Unknown reaction/interaction. 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 pylori infection 11/16/1993   Hiatal hernia    History of benign adrenal tumor 2007   [facility]   History of pneumonia 2007, 2008   Hyperlipidemia    Hypertension    IBS (irritable bowel syndrome)    Internal hemorrhoids    Kyphosis    Long term current use of antipsychotic medication 08/19/2022   Memory loss    Menopause    Metabolic syndrome    OSA on CPAP    Prolonged Q-T interval on ECG 08/19/2022   Type 2 diabetes mellitus (HCC)    Vitamin D  deficiency     Current Outpatient  Medications:    Ascorbic Acid  (VITAMIN C ) 1000 MG tablet, Take 1,000 mg by mouth in the morning., Disp: , Rfl:    CALCIUM  PO, Take 1,200 mg by mouth in the morning., Disp: , Rfl:    Carboxymethylcellulose Sodium 0.25 % SOLN, Place 1-2 drops into both eyes as needed (dry eyes)., Disp: , Rfl:    Cholecalciferol  (VITAMIN D -3) 5000 UNITS TABS, Take 5,000 Units by mouth in the morning., Disp: , Rfl:    desloratadine  (CLARINEX ) 5 MG tablet, Take 1 tablet (5 mg total) by mouth in the morning., Disp: 90 tablet, Rfl: 4   famotidine  (PEPCID ) 20 MG tablet, TAKE ONE TABLET BY MOUTH AT BEDTIME, Disp: 90 tablet, Rfl: 1   fenofibrate  160 MG tablet, TAKE 1 TABLET BY MOUTH EVERY DAY, Disp: 90 tablet, Rfl: 1   glycerin adult 2 g suppository, Place 1 suppository rectally as needed for constipation., Disp: , Rfl:    ibuprofen (ADVIL) 200 MG tablet, Take 200-400 mg by mouth every 8 (eight) hours as needed (pain.)., Disp: , Rfl:    irbesartan -hydrochlorothiazide  (AVALIDE) 300-12.5 MG tablet, Take 1 tablet by mouth daily. For BP. **dose change, Disp: 90 tablet, Rfl: 3   melatonin 10 MG TABS, Take 10 mg by mouth at bedtime., Disp: , Rfl:    memantine  (NAMENDA ) 5 MG tablet, TAKE 1 TABLET BY MOUTH  TWICE A DAY, Disp: 180 tablet, Rfl: 1   mirtazapine  (REMERON ) 45 MG tablet, Take 1 tablet (45 mg total) by mouth at bedtime., Disp: 90 tablet, Rfl: 2   montelukast  (SINGULAIR ) 10 MG tablet, Take 1 tablet (10 mg total) by mouth daily. (Patient taking differently: Take 10 mg by mouth at bedtime.), Disp: 100 tablet, Rfl: 3   ondansetron  (ZOFRAN -ODT) 4 MG disintegrating tablet, Take 1 tablet (4 mg total) by mouth every 8 (eight) hours as needed for nausea or vomiting., Disp: 20 tablet, Rfl: 0   pantoprazole  (PROTONIX ) 40 MG tablet, Take 1 tablet (40 mg total) by mouth 2 (two) times daily before a meal., Disp: 60 tablet, Rfl: 4   PROAIR  HFA 108 (90 BASE) MCG/ACT inhaler, Use 2 puffs into the lungs  every 6 hours as needed for wheezing  or shortness of  breath (Patient taking differently: Inhale 2 puffs into the lungs every 6 (six) hours as needed for wheezing or shortness of breath.), Disp: 25.5 g, Rfl: 2   sertraline  (ZOLOFT ) 50 MG tablet, Take 1 tablet (50 mg total) by mouth daily., Disp: 90 tablet, Rfl: 2 Social History   Socioeconomic History   Marital status: Married    Spouse name: [name]   Number of children: 2   Years of education: some college   Highest education level: Some college, no degree  Occupational History   Occupation: retired    Comment: daycare  Tobacco Use   Smoking status: Never   Smokeless tobacco: Never  Vaping Use   Vaping status: Never Used  Substance and Sexual Activity   Alcohol  use: No   Drug use: No   Sexual activity: Not on file  Other Topics Concern   Not on file  Social History Narrative   Ashtin is retired. She lives with her husband [name]. They have two grown daughters that live locally. Phaedra enjoys reading and puzzles.       Social Drivers of Corporate investment banker Strain: Low Risk  (04/25/2024)   Overall Financial Resource Strain (CARDIA)    Difficulty of Paying Living Expenses: Not hard at all  Food Insecurity: No Food Insecurity (04/25/2024)   Hunger Vital Sign    Worried About Running Out of Food in the Last Year: Never true    Ran Out of Food in the Last Year: Never true  Transportation Needs: No Transportation Needs (01/20/2024)   [facility] from [facility] - Transportation    Lack of Transportation (Medical): No    Lack of Transportation (Non-Medical): No  Physical Activity: Insufficiently Active (04/25/2024)   Exercise Vital Sign    Days of Exercise per Week: 7 days    Minutes of Exercise per Session: 20 min  Stress: No Stress Concern Present (04/25/2024)   [employer] of Occupational Health - Occupational Stress Questionnaire    Feeling of Stress : Not at all  Social Connections: Moderately Isolated (04/25/2024)   Social Connection  and Isolation Panel    Frequency of Communication with Friends and Family: More than three times a week    Frequency of Social Gatherings with Friends and Family: More than three times a week    Attends Religious Services: Never    Database administrator or Organizations: No    Attends Banker Meetings: Never    Marital Status: Married  Catering manager Violence: Unknown (01/11/2024)   Humiliation, Afraid, Rape, and Kick questionnaire    Fear of Current or Ex-Partner:  Patient declined    Emotionally Abused: Patient declined    Physically Abused: Patient declined    Sexually Abused: Not on file   Family History  Problem Relation Age of Onset   Heart disease Mother    Parkinsonism Father    Emphysema Father    Hip fracture Father    Heart failure Sister    Ovarian cancer Sister    COPD Sister    Lymphoma Sister    COPD Sister    COPD Brother    Hypertension Brother    Heart disease Brother    COPD Brother    Colon cancer Neg Hx    Esophageal cancer Neg Hx    Pancreatic cancer Neg Hx    Stomach cancer Neg Hx    Breast cancer Neg Hx     Objective: Office vital signs reviewed. BP (!) 155/70   Pulse (!) 51   Temp 97.6 F (36.4 C)   Ht 5' (1.524 m)   Wt 144 lb 6 oz (65.5 kg)   SpO2 96%   BMI 28.20 kg/m   Physical Examination:  General: Awake, alert, nontoxic elderly female, cushingoid body habitus.  No acute distress HEENT: sclera white Cardio: regular rate and rhythm, S1S2 heard, no murmurs appreciated Pulm: clear to auscultation bilaterally, no wheezes, rhonchi or rales; normal work of breathing on room air Extremities: warm, well perfused, No edema, cyanosis or clubbing; +2 pulses bilaterally Neuro: Hard of hearing  Diabetic Foot Exam - Simple   Simple Foot Form Diabetic Foot exam was performed with the following findings: Yes 09/07/2024  9:59 AM  Visual Inspection No deformities, no ulcerations, no other skin breakdown bilaterally: Yes Sensation  Testing Intact to touch and monofilament testing bilaterally: Yes Pulse Check Posterior Tibialis and Dorsalis pulse intact bilaterally: Yes Comments     Lab Results  Component Value Date   HGBA1C 5.0 03/07/2024    Assessment/ Plan: 73 y.o. female   Type 2 diabetes mellitus with other specified complication, without long-term current use of insulin (HCC) - Plan: Bayer DCA Hb A1c Waived, Basic Metabolic Panel, Microalbumin / creatinine urine ratio, EKG 12-Lead, AMB Referral [facility] Care Management  Hyperlipidemia associated with type 2 diabetes mellitus (HCC) - Plan: EKG 12-Lead, AMB Referral [facility] Care Management  Hypertension associated with diabetes (HCC) - Plan: Basic Metabolic Panel, EKG 12-Lead, AMB Referral [facility] Care Management, amLODipine  (NORVASC ) 2.5 MG tablet  Iron deficiency anemia, unspecified iron deficiency anemia type - Plan: Iron, TIBC and Ferritin Panel, CBC with Differential  Osteopenia with high risk of fracture - Plan: VITAMIN D  25 Hydroxy (Vit-D Deficiency, Fractures)  Encounter for medication monitoring - Plan: EKG 12-Lead  Check A1c, urine microalbumin.  She will schedule diabetic eye exam.  We looked at her EKG given use of tetracycline antidepressant and SSRI and there was no evidence of QTc prolongation.  Not yet due for fasting lipid  Blood pressure is not controlled.  Norvasc  added.  We discussed again in detail today needs for diet modification, reduction in salt and increasing physical activity.  I am also can get a referral to [facility] for ongoing disease management and counseling.  Plan for close follow-up in the next month or so for repeat blood pressures  Did not discuss iron deficiency, osteopenia today but labs were collected with A1c   Keylen Uzelac CHRISTELLA Fielding, DO [facility] [phone]

## 2024-09-08 LAB — BASIC METABOLIC PANEL WITH GFR
BUN/Creatinine Ratio: 22 (ref 12–28)
BUN: 27 mg/dL (ref 8–27)
CO2: 24 mmol/L (ref 20–29)
Calcium: 9.9 mg/dL (ref 8.7–10.3)
Chloride: 103 mmol/L (ref 96–106)
Creatinine, Ser: 1.24 mg/dL — ABNORMAL HIGH (ref 0.57–1.00)
Glucose: 84 mg/dL (ref 70–99)
Potassium: 4.3 mmol/L (ref 3.5–5.2)
Sodium: 141 mmol/L (ref 134–144)
eGFR: 46 mL/min/1.73 — ABNORMAL LOW (ref >59)

## 2024-09-08 LAB — CBC WITH DIFFERENTIAL/PLATELET
Basophils Absolute: 0.1 x10E3/uL (ref 0.0–0.2)
Basos: 1 %
EOS (ABSOLUTE): 0.2 x10E3/uL (ref 0.0–0.4)
Eos: 3 %
Hematocrit: 32.1 % — ABNORMAL LOW (ref 34.0–46.6)
Hemoglobin: 10.7 g/dL — ABNORMAL LOW (ref 11.1–15.9)
Immature Grans (Abs): 0 x10E3/uL (ref 0.0–0.1)
Immature Granulocytes: 0 %
Lymphocytes Absolute: 2.2 x10E3/uL (ref 0.7–3.1)
Lymphs: 43 %
MCH: 31.7 pg (ref 26.6–33.0)
MCHC: 33.3 g/dL (ref 31.5–35.7)
MCV: 95 fL (ref 79–97)
Monocytes Absolute: 0.3 x10E3/uL (ref 0.1–0.9)
Monocytes: 7 %
Neutrophils Absolute: 2.3 x10E3/uL (ref 1.4–7.0)
Neutrophils: 46 %
Platelets: 298 x10E3/uL (ref 150–450)
RBC: 3.38 x10E6/uL — ABNORMAL LOW (ref 3.77–5.28)
RDW: 13.2 % (ref 11.7–15.4)
WBC: 5 x10E3/uL (ref 3.4–10.8)

## 2024-09-08 LAB — IRON,TIBC AND FERRITIN PANEL
Ferritin: 50 ng/mL (ref 15–150)
Iron Saturation: 16 % (ref 15–55)
Iron: 73 ug/dL (ref 27–139)
Total Iron Binding Capacity: 465 ug/dL — ABNORMAL HIGH (ref 250–450)
UIBC: 392 ug/dL — ABNORMAL HIGH (ref 118–369)

## 2024-09-08 LAB — MICROALBUMIN / CREATININE URINE RATIO
Creatinine, Urine: 39.9 mg/dL
Microalb/Creat Ratio: 72 mg/g{creat} — ABNORMAL HIGH (ref 0–29)
Microalbumin, Urine: 28.9 ug/mL

## 2024-09-08 LAB — VITAMIN D 25 HYDROXY (VIT D DEFICIENCY, FRACTURES): Vit D, 25-Hydroxy: 56.4 ng/mL (ref 30.0–100.0)

## 2024-09-10 ENCOUNTER — Ambulatory Visit: Payer: Self-pay | Admitting: Family Medicine

## 2024-09-10 ENCOUNTER — Telehealth: Payer: Self-pay | Admitting: *Deleted

## 2024-09-10 DIAGNOSIS — N179 Acute kidney failure, unspecified: Secondary | ICD-10-CM

## 2024-09-10 NOTE — Progress Notes (Signed)
 Complex Care Management Note  Care Guide Note 09/10/2024 Name: Melissa Bowman MRN: [id number] DOB: 10/24/1951  Melissa Bowman is a 73 y.o. year old female who sees [name], DO for primary care. I reached out to [name] by phone today to offer complex care management services.  [name] was given information about Complex Care Management services today including:   The Complex Care Management services include support from the care team which includes your Nurse Care Manager, Clinical Social Worker, or Pharmacist.  The Complex Care Management team is here to help remove barriers to the health concerns and goals most important to you. Complex Care Management services are voluntary, and the patient may decline or stop services at any time by request to their care team member.   Complex Care Management Consent Status: Patient agreed to services and verbal consent obtained.   Follow up plan:  Telephone appointment with complex care management team member scheduled for:  10/07/2024  Encounter Outcome:  Patient Scheduled  Thedford Franks, CMA [place]  [facility], [facility] Guide Direct Dial: [phone]  Fax: [phone] Website: [email or web address]

## 2024-09-12 ENCOUNTER — Other Ambulatory Visit: Payer: Self-pay | Admitting: Family Medicine

## 2024-09-12 DIAGNOSIS — E1122 Type 2 diabetes mellitus with diabetic chronic kidney disease: Secondary | ICD-10-CM

## 2024-09-12 MED ORDER — EMPAGLIFLOZIN 10 MG PO TABS: 10.0000 mg | ORAL_TABLET | Freq: Every day | ORAL | 3 refills | Status: DC

## 2024-09-14 ENCOUNTER — Telehealth: Payer: Self-pay | Admitting: Family Medicine

## 2024-09-14 ENCOUNTER — Telehealth: Payer: Self-pay | Admitting: Pharmacist

## 2024-09-14 DIAGNOSIS — E1169 Type 2 diabetes mellitus with other specified complication: Secondary | ICD-10-CM

## 2024-09-14 NOTE — Telephone Encounter (Signed)
 So I anticipated that her insurance would likely not cover this well and that is why I referred her to [facility] to get some patient assistance.  Has she gotten a call from them yet?

## 2024-09-14 NOTE — Telephone Encounter (Signed)
 PCP requesting pharmacy assistance for diabetes management.  [id number] placed to pharmacy.

## 2024-09-14 NOTE — Telephone Encounter (Signed)
 Melissa Bowman is costing over $700 and wants something else called in.

## 2024-09-14 NOTE — Telephone Encounter (Signed)
 Copied from CRM #[id number]. Topic: Clinical - Medication Question >> Sep 14, 2024 11:53 AM Antwanette L wrote: Reason for CRM: Chelsea from [facility] is calling to inquire whether Dr. Jolinda can prescribe an alternative medication to empagliflozin (JARDIANCE) 10 mg tablets, as the current prescription is too expensive for the patient. I spoke with Misty from [facility], who confirmed she would send a message regarding this request

## 2024-09-14 NOTE — Telephone Encounter (Signed)
 Duplicate

## 2024-09-18 ENCOUNTER — Other Ambulatory Visit

## 2024-09-18 DIAGNOSIS — M8580 Other specified disorders of bone density and structure, unspecified site: Secondary | ICD-10-CM

## 2024-09-18 DIAGNOSIS — N179 Acute kidney failure, unspecified: Secondary | ICD-10-CM

## 2024-09-19 ENCOUNTER — Ambulatory Visit: Payer: Self-pay | Admitting: Family Medicine

## 2024-09-19 LAB — BASIC METABOLIC PANEL WITH GFR
BUN/Creatinine Ratio: 19 (ref 12–28)
BUN: 20 mg/dL (ref 8–27)
CO2: 22 mmol/L (ref 20–29)
Calcium: 9.6 mg/dL (ref 8.7–10.3)
Chloride: 104 mmol/L (ref 96–106)
Creatinine, Ser: 1.03 mg/dL — ABNORMAL HIGH (ref 0.57–1.00)
Glucose: 83 mg/dL (ref 70–99)
Potassium: 4.1 mmol/L (ref 3.5–5.2)
Sodium: 143 mmol/L (ref 134–144)
eGFR: 57 mL/min/1.73 — ABNORMAL LOW (ref >59)

## 2024-09-19 LAB — VITAMIN D 25 HYDROXY (VIT D DEFICIENCY, FRACTURES): Vit D, 25-Hydroxy: 59.1 ng/mL (ref 30.0–100.0)

## 2024-09-22 ENCOUNTER — Other Ambulatory Visit: Payer: Self-pay | Admitting: Family Medicine

## 2024-09-25 ENCOUNTER — Other Ambulatory Visit: Payer: Self-pay | Admitting: Family Medicine

## 2024-09-26 NOTE — Progress Notes (Signed)
 09/27/2024 Name: Melissa Bowman MRN: [id number] DOB: 12-25-1950  Chief Complaint  Patient presents with   Diabetes   Hyperlipidemia   Hypertension    High blood pressure at home.    Melissa Bowman is a 73 y.o. year old female who was referred for medication management by their primary care provider, Jolinda Potter M, DO. They presented for a face to face visit today.   They were referred to the pharmacist by their PCP for assistance in managing diabetes    Subjective:  Care Team: Primary Care Provider: Jolinda Potter HERO, DO ; Next Scheduled Visit: 03/15/2025 Neurologist: Whitfield Raisin, NP; Next Scheduled Visit: 03/11/2025  Medication Access/Adherence  Current Pharmacy:  [facility] [place], [place] - [address] [place] [address] Phone: [phone] Fax: [phone]  [id number] [place] [place] - [address] [place] [address] Phone: [phone] Fax: [phone]   Patient reports affordability concerns with their medications: Yes  Patient reports access/transportation concerns to their pharmacy: No  Patient reports adherence concerns with their medications:  No  Takes medications at 7 am each morning.   Diabetes:  Current medications: Jardiance prescribed, unable to pick up due to cost. Medications tried in the past: N/A  Patient denies hypoglycemic s/sx including dizziness, shakiness, sweating. Patient denies hyperglycemic symptoms including polyuria, polydipsia, polyphagia, nocturia, neuropathy, blurred vision.  Current meal patterns: little appetite since lost sense of taste about 1.5 years ago - Breakfast: half an egg, half sausage, 2 pieces of bacon, honey wheat - Lunch: Not much - Supper: grilled chicken, prefers corn and green beans - Snacks: pistachios if snacks - Drinks: Splenda sweet tea  Current physical activity: had a fall in January and broke arm, stairs  in house that frequently uses  Current medication access support: PAP for Farxiga initiated, samples provided for Jardiance 10 mg  Macrovascular and Microvascular Risk Reduction:  Statin? no; patient previously intolerant to statin therapy; on Fenofibrate  ACEi/ARB? yes (Irbesartan /HCTZ) Last urinary albumin/creatinine ratio:  Lab Results  Component Value Date   MICRALBCREAT 72 (H) 09/07/2024   MICRALBCREAT <8 09/12/2023   MICRALBCREAT 8 11/15/2022   MICRALBCREAT 122.6 (H) 08/05/2017   Last eye exam:  Lab Results  Component Value Date   HMDIABEYEEXA No Retinopathy 07/19/2023   Last foot exam: 09/07/2024 Tobacco Use:  Tobacco Use: Low Risk  (09/07/2024)   Patient History    Smoking Tobacco Use: Never    Smokeless Tobacco Use: Never    Passive Exposure: Not on file   Hypertension:  Current medications: Amlodipine , Irbesartan /HCTZ Medications previously tried: checks at home, never below 148, no swelling   Patient has a validated, automated, upper arm home BP cuff Current blood pressure home readings: SBP never below 148 per patient and husband  Patient denies hypotensive s/sx including dizziness, lightheadedness.  Patient reports hypertensive symptoms including headache.  Hyperlipidemia/ASCVD Risk Reduction  Current lipid lowering medications: Fenofibrate  Medications tried in the past: Statins with muscle pain  Antiplatelet regimen: None  ASCVD History: None Family History: N/A Risk Factors: T2D, HTN, HLD  Current medication access support: Initiate Repatha assistance  PREVENT Risk Score:  [email or web address] - 10 year risk of CVD: 30.9% - 10 year risk of ASCVD: 17.2% - 10 year risk of HF: 16.2%  Objective:  Lab Results  Component Value Date   HGBA1C 4.6 (L) 09/07/2024    Lab Results  Component Value Date   CREATININE  1.03 (H) 09/18/2024   BUN 20 09/18/2024   NA 143  09/18/2024   K 4.1 09/18/2024   CL 104 09/18/2024   CO2 22 09/18/2024    Lab Results  Component Value Date   CHOL 172 03/07/2024   HDL 41 03/07/2024   LDLCALC 110 (H) 03/07/2024   TRIG 117 03/07/2024   CHOLHDL 4.2 03/07/2024    Medications Reviewed Today     Reviewed by Gaile Powell HERO, [facility] (Pharmacist) on 09/27/24 at 1047  Med List Status: <None>   Medication Order Taking? Sig Documenting Provider Last Dose Status Informant  amLODipine  (NORVASC ) 2.5 MG tablet [id number] Yes Take 1 tablet (2.5 mg total) by mouth daily. Take with your other blood pressure pill Jolinda Potter M, DO  Active   Ascorbic Acid  (VITAMIN C ) 1000 MG tablet [id number] Yes Take 1,000 mg by mouth in the morning. [provider]  Active Spouse/Significant Other, Pharmacy Records  CALCIUM  PO [id number] Yes Take 1,200 mg by mouth in the morning. [provider]  Active Spouse/Significant Other, Pharmacy Records  Carboxymethylcellulose Sodium 0.25 % SOLN [id number]  Place 1-2 drops into both eyes as needed (dry eyes). [provider]  Active Spouse/Significant Other, Pharmacy Records  Cholecalciferol  (VITAMIN D -3) 5000 UNITS TABS [id number] Yes Take 5,000 Units by mouth in the morning. [provider]  Active Spouse/Significant Other, Pharmacy Records  desloratadine  (CLARINEX ) 5 MG tablet [id number]  Take 1 tablet (5 mg total) by mouth in the morning.  Patient not taking: Reported on 09/27/2024   Jolinda Potter M, DO  Active   empagliflozin (JARDIANCE) 10 MG TABS tablet [id number]  Take 1 tablet (10 mg total) by mouth daily.  Patient not taking: Reported on 09/27/2024   Jolinda Potter HERO, DO  Consider Medication Status and Discontinue (Change in therapy)   famotidine  (PEPCID ) 20 MG tablet [id number] Yes TAKE ONE TABLET BY MOUTH AT BEDTIME Zehr, Jessica D, PA-C  Active   fenofibrate  160 MG tablet [id number] Yes TAKE 1 TABLET BY MOUTH EVERY DAY Jolinda Potter M, DO  Active    glycerin adult 2 g suppository [id number]  Place 1 suppository rectally as needed for constipation. [provider]  Active Spouse/Significant Other, Pharmacy Records  ibuprofen (ADVIL) 200 MG tablet [id number]  Take 200-400 mg by mouth every 8 (eight) hours as needed (pain.). [provider]  Active Spouse/Significant Other, Pharmacy Records  irbesartan -hydrochlorothiazide  (AVALIDE) 300-12.5 MG tablet [id number] Yes Take 1 tablet by mouth daily. For BP. **dose change Jolinda Potter HERO, DO  Active   melatonin 10 MG TABS [id number] Yes Take 10 mg by mouth at bedtime. Raenelle Donalda HERO, MD  Active   memantine  (NAMENDA ) 5 MG tablet [id number] Yes TAKE 1 TABLET BY MOUTH TWICE A DAY Jolinda Potter M, DO  Active   mirtazapine  (REMERON ) 45 MG tablet [id number] Yes Take 1 tablet (45 mg total) by mouth at bedtime. Barbra Jayson LABOR, MD  Active   montelukast  (SINGULAIR ) 10 MG tablet [id number] Yes TAKE ONE TABLET BY MOUTH DAILY Jolinda Potter M, DO  Active   ondansetron  (ZOFRAN -ODT) 4 MG disintegrating tablet [id number]  Take 1 tablet (4 mg total) by mouth every 8 (eight) hours as needed for nausea or vomiting. Sharl Selinda Dover, MD  Active Spouse/Significant Other, Pharmacy Records  pantoprazole  (PROTONIX ) 40 MG tablet [id number]  Take 1 tablet (40 mg total) by mouth 2 (two) times daily before a meal.  Patient not taking: Reported on 09/27/2024   Jolinda Potter HERO, DO  Active  PROAIR  HFA 108 (90 BASE) MCG/ACT inhaler [id number]  Use 2 puffs into the lungs  every 6 hours as needed for wheezing or shortness of  breath  Patient taking differently: Inhale 2 puffs into the lungs every 6 (six) hours as needed for wheezing or shortness of breath.   Zollie Lowers, MD  Active Spouse/Significant Other, Pharmacy Records           Med Note LEOBARDO, NICOLE   Tue Jan 10, 2024 10:59 PM) Last dose unknown  sertraline  (ZOLOFT ) 50 MG tablet [id number] Yes Take 1 tablet (50 mg total) by mouth daily.  Barbra Jayson LABOR, MD  Active               Assessment/Plan:   Diabetes: - Currently controlled; goal A1c <7%. Cardiorenal risk reduction is opportunities for improvement.. Blood pressure is not at goal <130/80. LDL is not at goal.  - Reviewed dietary modifications including salt reduction, plate method. - Recommend to start Farxiga 5 mg . Provided Jardiance 10 mg samples for first month. - Discussed potential side effects of dehydration, genitourinary infections., Advised on sick day rules (if a day with significantly reduced oral intake, serious vomiting, or diarrhea, hold SGLT2), Encouraged adequate hydration and genital hygiene.  - Reported frequent urination daytime and nighttime with urgency.  Hypertension: - Currently uncontrolled, BP 145/77 today. Reported increased blood pressure 6 months ago when woke up with headache and nose bleed.  - Reviewed long term cardiovascular and renal outcomes of uncontrolled blood pressure - Reviewed appropriate blood pressure monitoring technique and reviewed goal blood pressure. Recommended to check home blood pressure and heart rate daily and with symptoms of high blood pressure. - Recommend to limit sodium consumption, eat heart healthy diet, plate method - Recommended to continue amlodipine  2.5 mg and irbesartan /hydrochlorothiazide  300-12.5 mg - Recommended to avoid NSAIDs.  Hyperlipidemia/ASCVD Risk Reduction: - Currently uncontrolled.  - Reviewed long term complications of uncontrolled cholesterol - Reviewed dietary recommendations including heart healthy diet, plate method - Reviewed lifestyle recommendations including physical activity - Recommend to continue fenofibrate , start Repatha with patient assistance  - Meets financial criteria for Farxiga patient assistance program through [employer] PAP. Will collaborate with provider, CPhT, and patient to pursue assistance.   Follow Up Plan: 1 month to review patient assistance program and  assess efficacy and safety of therapy.  Follow Up with Primary Care 3 months from last appointment.  Powell Gallus, PharmD, MPH Pharmacy Resident  [facility] Tarry Griffin, PharmD, BCACP, CPP Clinical Pharmacist, [facility]

## 2024-09-27 ENCOUNTER — Encounter: Payer: Self-pay | Admitting: *Deleted

## 2024-09-27 ENCOUNTER — Telehealth: Payer: Self-pay | Admitting: Pharmacist

## 2024-09-27 ENCOUNTER — Telehealth: Payer: Self-pay | Admitting: *Deleted

## 2024-09-27 ENCOUNTER — Ambulatory Visit (INDEPENDENT_AMBULATORY_CARE_PROVIDER_SITE_OTHER): Admitting: Pharmacist

## 2024-09-27 DIAGNOSIS — E1169 Type 2 diabetes mellitus with other specified complication: Secondary | ICD-10-CM | POA: Diagnosis not present

## 2024-09-27 DIAGNOSIS — E785 Hyperlipidemia, unspecified: Secondary | ICD-10-CM

## 2024-09-27 DIAGNOSIS — I152 Hypertension secondary to endocrine disorders: Secondary | ICD-10-CM

## 2024-09-27 DIAGNOSIS — R809 Proteinuria, unspecified: Secondary | ICD-10-CM | POA: Diagnosis not present

## 2024-09-27 DIAGNOSIS — E1129 Type 2 diabetes mellitus with other diabetic kidney complication: Secondary | ICD-10-CM

## 2024-09-27 DIAGNOSIS — E1159 Type 2 diabetes mellitus with other circulatory complications: Secondary | ICD-10-CM | POA: Diagnosis not present

## 2024-09-27 NOTE — Patient Instructions (Signed)
 Melissa Bowman - I am sorry we were unable to complete our call today for our scheduled appointment. At your request, I have rescheduled our call for 10-24-2024 at 11:00 am. I work with [name], DO and am calling to support your healthcare needs. Please contact me at [phone] at your earliest convenience. I look forward to speaking with you soon.   Thank you,  Rosina Forte, BSN RN [facility], [facility] RN Care Manager Direct Dial: [phone]  Fax: [phone]

## 2024-10-05 ENCOUNTER — Encounter: Payer: Self-pay | Admitting: Family Medicine

## 2024-10-09 ENCOUNTER — Telehealth: Payer: Self-pay

## 2024-10-09 ENCOUNTER — Other Ambulatory Visit (HOSPITAL_COMMUNITY): Payer: Self-pay

## 2024-10-09 NOTE — Telephone Encounter (Signed)
 Rec'd completed application for NEW [place] assistance.  PAP: Application for Farxiga has been submitted to [employer] ([employer]), via fax

## 2024-10-13 ENCOUNTER — Other Ambulatory Visit: Payer: Self-pay | Admitting: *Deleted

## 2024-10-16 ENCOUNTER — Ambulatory Visit

## 2024-10-18 MED ORDER — DAPAGLIFLOZIN PROPANEDIOL 10 MG PO TABS: 10.0000 mg | ORAL_TABLET | Freq: Every day | ORAL | 3 refills | Status: AC

## 2024-10-18 MED ORDER — REPATHA SURECLICK 140 MG/ML ~~LOC~~ SOAJ: 140.0000 mg | SUBCUTANEOUS | 2 refills | Status: DC

## 2024-10-19 ENCOUNTER — Encounter: Payer: Self-pay | Admitting: *Deleted

## 2024-10-19 ENCOUNTER — Other Ambulatory Visit (HOSPITAL_COMMUNITY): Payer: Self-pay

## 2024-10-19 ENCOUNTER — Telehealth: Payer: Self-pay | Admitting: Pharmacy Technician

## 2024-10-19 NOTE — Telephone Encounter (Signed)
 Pharmacy Patient Advocate Encounter   Received notification from Onbase that prior authorization for Repatha SureClick 140MG /ML auto-injectors is required/requested.   Insurance verification completed.   The patient is insured through [facility].   Per test claim: PA required; PA started via CoverMyMeds. KEY [id number] . Waiting for clinical questions to populate.

## 2024-10-19 NOTE — Telephone Encounter (Signed)
 Pharmacy Patient Advocate Encounter  Received notification from [facility] that Prior Authorization for Repatha SureClick 140MG /ML auto-injectors has been APPROVED from 10/19/24 to 12/12/24. Ran test claim, Copay is $144.24. This test claim was processed through [facility]- copay amounts may vary at other pharmacies due to pharmacy/plan contracts, or as the patient moves through the different stages of their insurance plan.   PA #/Case ID/Reference #: [id number]

## 2024-10-24 ENCOUNTER — Other Ambulatory Visit: Payer: Self-pay | Admitting: *Deleted

## 2024-10-24 NOTE — Patient Instructions (Signed)
 Visit Information  Thank you for taking time to visit with me today. Please don't hesitate to contact me if I can be of assistance to you before our next scheduled appointment.  Our next appointment is no further scheduled appointments.   Please call the care guide team at [phone] if you need to cancel or reschedule your appointment.   Please call the Suicide and Crisis Lifeline: 988 call the [place]  National Suicide Prevention Lifeline: (506)820-5096 or TTY: 3301240226 TTY 804-744-4625) to talk to a trained counselor call 1-800-273-TALK (toll free, 24 hour hotline) if you are experiencing a Mental Health or Behavioral Health Crisis or need someone to talk to.  Patient verbalized understanding of Care plan and visit instructions communicated this visit  Rosina Forte, BSN RN [facility], [facility] RN Care Manager Direct Dial: [phone]  Fax: [phone]

## 2024-10-24 NOTE — Patient Outreach (Signed)
 Complex Care Management   Visit Note  10/24/2024  Name:  Melissa Bowman MRN: [id number] DOB: Mar 31, 1951  Situation: Referral received for Complex Care Management related to Diabetes with Complications I obtained verbal consent from Patient.  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 pylori infection 11/16/1993   Hiatal hernia    History of benign adrenal tumor 2007   [facility]   History of pneumonia 2007, 2008   Hyperlipidemia    Hypertension    IBS (irritable bowel syndrome)    Internal hemorrhoids    Kyphosis    Long term current use of antipsychotic medication 08/19/2022   Memory loss    Menopause    Metabolic syndrome    OSA on CPAP    Prolonged Q-T interval on ECG 08/19/2022   Type 2 diabetes mellitus (HCC)    Vitamin D  deficiency     Assessment: Patient Reported Symptoms:  Cognitive Cognitive Status: Struggling with memory recall Cognitive/Intellectual Conditions Management [RPT]: None reported or documented in medical history or problem list   Health Maintenance Behaviors: Annual physical exam Healing Pattern: Average Health Facilitated by: Rest  Neurological Neurological Review of Symptoms: No symptoms reported Neurological Management Strategies: Routine screening Neurological Self-Management Outcome: 4 (good)  HEENT HEENT Symptoms Reported: Change or loss of hearing HEENT Management Strategies: Routine screening HEENT Self-Management Outcome: 3 (uncertain) HEENT Comment: has hearing aides with continued hearing difficulty    Cardiovascular Cardiovascular Symptoms Reported: No symptoms reported Does patient have uncontrolled Hypertension?: Yes Is patient  checking Blood Pressure at home?: Yes Patient's Recent BP reading at home: 141/75 Cardiovascular Management Strategies: Medication therapy, Routine screening Cardiovascular Self-Management Outcome: 4 (good)  Respiratory Respiratory Symptoms Reported: No symptoms reported Respiratory Management Strategies: Routine screening Respiratory Self-Management Outcome: 4 (good)  Endocrine Endocrine Symptoms Reported: No symptoms reported Is patient diabetic?: Yes Is patient checking blood sugars at home?: Yes List most recent blood sugar readings, include date and time of day: 10/23/24 120 Endocrine Self-Management Outcome: 4 (good)  Gastrointestinal Gastrointestinal Symptoms Reported: Diarrhea Gastrointestinal Self-Management Outcome: 4 (good)    Genitourinary Genitourinary Symptoms Reported: Incontinence Genitourinary Management Strategies: Incontinence garment/pad, Coping strategies Genitourinary Self-Management Outcome: 4 (good)  Integumentary Integumentary Symptoms Reported: No symptoms reported Skin Management Strategies: Routine screening Skin Self-Management Outcome: 4 (good)  Musculoskeletal Musculoskelatal Symptoms Reviewed: Unsteady gait Additional Musculoskeletal Details: USES CANE AS NEEDED Musculoskeletal Management Strategies: Routine screening Musculoskeletal Self-Management Outcome: 4 (good) Falls in the past year?: Yes Number of falls in past year: 1 or less Was there an injury with Fall?: Yes Fall Risk Category Calculator: 2 Patient Fall Risk Level: Moderate Fall Risk Patient at Risk for Falls Due to: History of fall(s), Impaired balance/gait Fall risk Follow up: Falls evaluation completed, Education provided  Psychosocial Psychosocial Symptoms Reported: No symptoms reported Behavioral Management Strategies: Coping strategies, Support system Behavioral Health Self-Management Outcome: 4 (good) Major Change/Loss/Stressor/Fears (CP): Denies Techniques to Cope with  Loss/Stress/Change: Diversional activities Quality of Family Relationships: involved, helpful, supportive Do you feel physically threatened by others?: No    10/24/2024    PHQ2-9 Depression Screening   Little interest or pleasure  in doing things Not at all  Feeling down, depressed, or hopeless Not at all  PHQ-2 - Total Score 0  Trouble falling or staying asleep, or sleeping too much    Feeling tired or having little energy    Poor appetite or overeating     Feeling bad about yourself - or that you are a failure or have let yourself or your family down    Trouble concentrating on things, such as reading the newspaper or watching television    Moving or speaking so slowly that other people could have noticed.  Or the opposite - being so fidgety or restless that you have been moving around a lot more than usual    Thoughts that you would be better off dead, or hurting yourself in some way    PHQ2-9 Total Score    If you checked off any problems, how difficult have these problems made it for you to do your work, take care of things at home, or get along with other people    Depression Interventions/Treatment      Vitals:   10/24/24 1129  BP: (!) 141/75   Pain Scale: 0-10 Pain Score: 0-No pain  Medications Reviewed Today     Reviewed by Bertrum Rosina HERO, RN (Registered Nurse) on 10/24/24 at 1119  Med List Status: <None>   Medication Order Taking? Sig Documenting Provider Last Dose Status Informant  amLODipine  (NORVASC ) 2.5 MG tablet [id number] Yes Take 1 tablet (2.5 mg total) by mouth daily. Take with your other blood pressure pill Jolinda Potter M, DO  Active   Ascorbic Acid  (VITAMIN C ) 1000 MG tablet [id number] Yes Take 1,000 mg by mouth in the morning. [provider]  Active Spouse/Significant Other, Pharmacy Records  CALCIUM  PO [id number] Yes Take 1,200 mg by mouth in the morning. [provider]  Active Spouse/Significant Other, Pharmacy Records   Carboxymethylcellulose Sodium 0.25 % SOLN [id number] Yes Place 1-2 drops into both eyes as needed (dry eyes). [provider]  Active Spouse/Significant Other, Pharmacy Records  Cholecalciferol  (VITAMIN D -3) 5000 UNITS TABS [id number] Yes Take 5,000 Units by mouth in the morning. [provider]  Active Spouse/Significant Other, Pharmacy Records  dapagliflozin propanediol (FARXIGA) 10 MG TABS tablet [id number] Yes Take 1 tablet (10 mg total) by mouth daily. Jolinda Potter M, DO  Active   desloratadine  (CLARINEX ) 5 MG tablet [id number]  Take 1 tablet (5 mg total) by mouth in the morning.  Patient not taking: Reported on 10/24/2024   Jolinda Potter M, DO  Active   empagliflozin (JARDIANCE) 10 MG TABS tablet [id number]  Take 1 tablet (10 mg total) by mouth daily.  Patient not taking: Reported on 10/24/2024   Jolinda Potter M, DO  Active   Evolocumab [facility] SURECLICK) 140 MG/ML EMMANUEL [id number]  Inject 140 mg into the skin every 14 (fourteen) days. Jolinda Potter M, DO  Active   famotidine  (PEPCID ) 20 MG tablet [id number] Yes TAKE ONE TABLET BY MOUTH AT BEDTIME Zehr, Jessica D, PA-C  Active   fenofibrate  160 MG tablet [id number] Yes TAKE 1 TABLET BY MOUTH EVERY DAY Jolinda Potter M, DO  Active   glycerin adult 2 g suppository [id number] Yes Place 1 suppository rectally as needed for constipation. [provider]  Active Spouse/Significant Other, Pharmacy Records  ibuprofen (ADVIL) 200 MG tablet [id number]  Take 200-400 mg by mouth every 8 (eight) hours as needed (pain.).  Patient not taking: Reported on 10/24/2024   [provider]  Consider Medication  Status and Discontinue Spouse/Significant Other, Pharmacy Records  irbesartan -hydrochlorothiazide  (AVALIDE) 300-12.5 MG tablet [id number] Yes Take 1 tablet by mouth daily. For BP. **dose change [name], DO  Active   melatonin 10 MG TABS [id number] Yes Take 10 mg by mouth at bedtime. Raenelle Donalda HERO,  MD  Active   memantine  (NAMENDA ) 5 MG tablet [id number] Yes TAKE 1 TABLET BY MOUTH TWICE A DAY Gottschalk, Ashly M, DO  Active   mirtazapine  (REMERON ) 45 MG tablet [id number] Yes Take 1 tablet (45 mg total) by mouth at bedtime. Barbra Jayson LABOR, MD  Active   montelukast  (SINGULAIR ) 10 MG tablet [id number] Yes TAKE ONE TABLET BY MOUTH DAILY [name] M, DO  Active   ondansetron  (ZOFRAN -ODT) 4 MG disintegrating tablet [id number] Yes Take 1 tablet (4 mg total) by mouth every 8 (eight) hours as needed for nausea or vomiting. Sharl Selinda Dover, MD  Active Spouse/Significant Other, Pharmacy Records  pantoprazole  (PROTONIX ) 40 MG tablet [id number] Yes TAKE ONE TABLET TWICE DAILY BEFORE MEALS [name] M, DO  Active   PROAIR  HFA 108 (90 BASE) MCG/ACT inhaler [id number] Yes Use 2 puffs into the lungs  every 6 hours as needed for wheezing or shortness of  breath  Patient taking differently: Inhale 2 puffs into the lungs every 6 (six) hours as needed for wheezing or shortness of breath.   Zollie Lowers, MD  Active Spouse/Significant Other, Pharmacy Records           Med Note (LEE, NICOLE   Tue Jan 10, 2024 10:59 PM) Last dose unknown  sertraline  (ZOLOFT ) 50 MG tablet [id number] Yes Take 1 tablet (50 mg total) by mouth daily. Barbra Jayson LABOR, MD  Active             Recommendation:   Continue Current Plan of Care  Follow Up Plan:   Closing From:  Complex Care Management  Rosina Forte, BSN RN [facility], [facility] RN Care Manager Direct Dial: [phone]  Fax: [phone]

## 2024-10-25 NOTE — Telephone Encounter (Signed)
 Assisted with transition of Jardiance samples to Farxiga patient assistance program enrollment.  Patient will finish samples of Jardiance and then transition to Farxiga 10mg  once shipment is delivered to patient's home.  She will receive 90 day supply.  Enrollment to continue through 2026.  F/u with PHarmD on 11/01/24  Mliss Tarry Griffin, PharmD, BCACP, CPP Clinical Pharmacist, [facility]

## 2024-10-31 NOTE — Progress Notes (Deleted)
 10/31/2024 Name: Melissa Bowman MRN: [id number] DOB: 1951/02/19  No chief complaint on file.   Melissa Bowman is a 73 y.o. year old female who presented for a telephone visit.   They were referred to the pharmacist by their PCP for assistance in managing diabetes. Initial appointment 09/27/2024.    Subjective:  Care Team: Primary Care Provider: [name], DO ; Next Scheduled Visit: 12/28/2024 Neurologist: Harlene Bogaert NP; Next Scheduled Visit: 03/15/2025  Medication Access/Adherence  Current Pharmacy:  [facility] [place], [place] - [address] [place] [address] Phone: [phone] Fax: [phone]  [id number] - [place], [place] - [address] [place] [address] Phone: [phone] Fax: [phone]  [facility] - [place], [place] - [address] [place] [address] Phone: [phone] Fax: [phone]   Patient reports affordability concerns with their medications: Yes  Patient reports access/transportation concerns to their pharmacy: {YES/NO:21197} Patient reports adherence concerns with their medications:  {YES/NO:21197} ***   Diabetes:  Current medications:  - Farxiga 10 mg daily. Assistance submitted to [employer]; approved 10/10/2024 Medications tried in the past: ***  Current glucose readings: ***  Date of Download: *** % Time CGM is active: ***% Average Glucose: *** mg/dL Glucose Management Indicator: ***  Glucose Variability: *** (goal <36%) Time in Goal:  - Time in range 70-180: ***% - Time above range: ***% - Time below range: ***% Observed patterns:  Patient {Actions; denies-reports:120008} hypoglycemic s/sx including ***dizziness, shakiness, sweating. Patient {Actions; denies-reports:120008} hyperglycemic symptoms including ***polyuria, polydipsia, polyphagia, nocturia, neuropathy, blurred vision.  Current meal patterns:  -  Breakfast: *** - Lunch *** - Supper *** - Snacks *** - Drinks ***  Current physical activity: ***  Current medication access support: ***  Macrovascular and Microvascular Risk Reduction:  Statin? No; ACEi/ARB? yes (irbesartan benson) Last urinary albumin/creatinine ratio:  Lab Results  Component Value Date   MICRALBCREAT 72 (H) 09/07/2024   MICRALBCREAT <8 09/12/2023   MICRALBCREAT 8 11/15/2022   MICRALBCREAT 122.6 (H) 08/05/2017   Last eye exam:  Lab Results  Component Value Date   HMDIABEYEEXA No Retinopathy 07/19/2023   Last foot exam: 09/07/2024 Tobacco Use:  Tobacco Use: Low Risk  (09/07/2024)   Patient History    Smoking Tobacco Use: Never    Smokeless Tobacco Use: Never    Passive Exposure: Not on file   Hypertension:  Current medications:  - Amlodipine  2.5 mg daily - Irbesartan /hydrochlorothiazide  300-12.5 mg Medications previously tried:   Patient {HAS/DOES NOT YJCZ:65250} a validated, automated, upper arm home BP cuff Current blood pressure readings readings: ***  Patient {Actions; denies-reports:120008} hypotensive s/sx including ***dizziness, lightheadedness.  Patient {Actions; denies-reports:120008} hypertensive symptoms including ***headache, chest pain, shortness of breath  Current meal patterns: ***  Current physical activity: ***   Hyperlipidemia/ASCVD Risk Reduction  Current lipid lowering medications:  - Repatha PA APPROVED 10/19/24, copay $855.75. Unsure if picked up - Fenofibrate  160 mg daily  Antiplatelet regimen: None   ASCVD History: None Family History: N/A Risk Factors: T2D, HTN, HLD  Current medication access support: Repatha?  PREVENT Risk Score:  [email or web address] - 10 year risk of CVD: 30.9% - 10 year risk of ASCVD: 17.2% - 10 year risk of HF: 16.2%   Objective:  Lab Results  Component Value Date   HGBA1C 4.6 (L) 09/07/2024     Lab Results  Component Value Date   CREATININE  1.03 (H) 09/18/2024   BUN 20 09/18/2024   NA 143 09/18/2024   K 4.1 09/18/2024   CL 104 09/18/2024   CO2 22 09/18/2024    Lab Results  Component Value Date   CHOL 172 03/07/2024   HDL 41 03/07/2024   LDLCALC 110 (H) 03/07/2024   TRIG 117 03/07/2024   CHOLHDL 4.2 03/07/2024    Medications Reviewed Today   Medications were not reviewed in this encounter       Assessment/Plan:   Diabetes: - Currently {CHL Controlled/Uncontrolled:[phone]}; goal A1c <7%. Cardiorenal risk reduction is {DMcardiorenalrisk:33490}. Blood pressure {ACTION; IS/IS NOT:21021397} at goal <130/80. LDL {ACTION; IS/IS NOT:21021397} at goal.  - {DMinterventions:33484} - {start stop continue:33488} - {DM Med Counseling:33486:x}  Hypertension: - Currently {CHL Controlled/Uncontrolled:[phone]} - Reviewed long term cardiovascular and renal outcomes of uncontrolled blood pressure - Reviewed appropriate blood pressure monitoring technique and reviewed goal blood pressure. Recommended to check home blood pressure and heart rate *** - Recommend to ***      Hyperlipidemia/ASCVD Risk Reduction: - Currently {CHL Controlled/Uncontrolled:[phone]}.  - Reviewed long term complications of uncontrolled cholesterol - Reviewed dietary recommendations including *** - Reviewed lifestyle recommendations including *** - Recommend to ***  - Meets financial criteria for *** patient assistance program through ***. Will collaborate with provider, CPhT, and patient to pursue assistance.    Follow Up Plan: *** - Repatha assistance? - Home BP?  ***

## 2024-11-01 ENCOUNTER — Other Ambulatory Visit

## 2024-11-09 ENCOUNTER — Other Ambulatory Visit: Payer: Self-pay | Admitting: Family Medicine

## 2024-11-15 ENCOUNTER — Other Ambulatory Visit: Payer: Self-pay | Admitting: Family Medicine

## 2024-11-15 DIAGNOSIS — G3184 Mild cognitive impairment, so stated: Secondary | ICD-10-CM

## 2024-11-19 ENCOUNTER — Telehealth: Payer: Self-pay | Admitting: Pharmacist

## 2024-11-19 NOTE — Telephone Encounter (Addendum)
 Patient didn't get repatha  yet Got farxiga  and tolerating well Will schedule for patient to come in to discuss starting Repatha  injection

## 2024-11-20 ENCOUNTER — Ambulatory Visit

## 2024-12-10 ENCOUNTER — Other Ambulatory Visit: Payer: Self-pay | Admitting: Family Medicine

## 2024-12-10 DIAGNOSIS — I152 Hypertension secondary to endocrine disorders: Secondary | ICD-10-CM

## 2024-12-11 ENCOUNTER — Telehealth: Payer: Self-pay | Admitting: Pharmacist

## 2024-12-11 ENCOUNTER — Ambulatory Visit: Admitting: Pharmacist

## 2024-12-11 VITALS — BP 138/72 | HR 75

## 2024-12-11 DIAGNOSIS — E785 Hyperlipidemia, unspecified: Secondary | ICD-10-CM | POA: Diagnosis not present

## 2024-12-11 DIAGNOSIS — Z7984 Long term (current) use of oral hypoglycemic drugs: Secondary | ICD-10-CM | POA: Diagnosis not present

## 2024-12-11 DIAGNOSIS — E1169 Type 2 diabetes mellitus with other specified complication: Secondary | ICD-10-CM

## 2024-12-11 DIAGNOSIS — G720 Drug-induced myopathy: Secondary | ICD-10-CM | POA: Diagnosis not present

## 2024-12-11 DIAGNOSIS — E782 Mixed hyperlipidemia: Secondary | ICD-10-CM | POA: Diagnosis not present

## 2024-12-11 MED ORDER — REPATHA SURECLICK 140 MG/ML ~~LOC~~ SOAJ: 140.0000 mg | SUBCUTANEOUS | 2 refills | Status: DC

## 2024-12-11 MED ORDER — REPATHA SURECLICK 140 MG/ML ~~LOC~~ SOAJ: 140.0000 mg | SUBCUTANEOUS | 2 refills | Status: AC

## 2024-12-11 NOTE — Telephone Encounter (Signed)
" ° °  Patient enrolled by PharmD in the [employer] grant for hyperlipidemia.  Copay card was given to patient to take to [facility] pharmacy to get her repatha .  Demo pen used to teach Reaptha injection technique.  Lorrene is good for 12 months.    Adreona Brand Dattero Yessica Putnam, PharmD, BCACP, CPP Clinical Pharmacist, [facility]  "

## 2024-12-11 NOTE — Progress Notes (Signed)
 "  12/11/2024 Name: Melissa Bowman MRN: [id number] DOB: 12-06-1951  Chief Complaint  Patient presents with   Hyperlipidemia    Melissa Bowman is a 73 y.o. year old female who was referred for medication management by their primary care provider, Jolinda Potter M, DO. They presented for a face to face visit today.   They were referred to the pharmacist by their PCP for assistance in managing hyperlipidemia/cardiovascular risk reduction and medication access    Subjective:  Care Team: Primary Care Provider: Jolinda Potter HERO, DO ; Next Scheduled Visit: 03/15/2025 Neurologist: Whitfield Raisin, NP; Next Scheduled Visit: 03/11/2025  Medication Access/Adherence  Current Pharmacy:  [facility] [place], [place] - [address] [place] [address] Phone: [phone] Fax: [phone]  [id number] - [place], [place] - [address] [place] [address] Phone: [phone] Fax: [phone]  [facility] - [place], [place] - [address] [place] [address] Phone: [phone] Fax: [phone]   Patient reports affordability concerns with their medications: Yes  Patient reports access/transportation concerns to their pharmacy: No  Patient reports adherence concerns with their medications:  No  Takes medications at 7 am each morning.   Hypertension:  Current medications: Amlodipine , Irbesartan /HCTZ Medications previously tried: checks at home, never below 148, no swelling   Patient has a validated, automated, upper arm home BP cuff Current blood pressure home readings: SBP never below 148 per patient and husband  Patient denies hypotensive s/sx including dizziness, lightheadedness.  Patient denieshypertensive symptoms including headache.  Hyperlipidemia/ASCVD Risk Reduction  Current lipid lowering medications: Fenofibrate  Medications tried in the past: Statins with muscle  pain  Antiplatelet regimen: None  ASCVD History: None Family History: N/A Risk Factors: T2D, HTN, HLD  Current medication access support: Initiate Repatha  assistance  PREVENT Risk Score:  [email or web address] - 10 year risk of CVD: 30.9% - 10 year risk of ASCVD: 17.2% - 10 year risk of HF: 16.2%  Objective:  Lab Results  Component Value Date   HGBA1C 4.6 (L) 09/07/2024    Lab Results  Component Value Date   CREATININE 1.03 (H) 09/18/2024   BUN 20 09/18/2024   NA 143 09/18/2024   K 4.1 09/18/2024   CL 104 09/18/2024   CO2 22 09/18/2024    Lab Results  Component Value Date   CHOL 172 03/07/2024   HDL 41 03/07/2024   LDLCALC 110 (H) 03/07/2024   TRIG 117 03/07/2024   CHOLHDL 4.2 03/07/2024    Medications Reviewed Today     Reviewed by Billee Mliss BIRCH, RPH-CPP (Pharmacist) on 12/11/24 at 1156  Med List Status: <None>   Medication Order Taking? Sig Documenting Provider Last Dose Status Informant  amLODipine  (NORVASC ) 2.5 MG tablet [id number]  TAKE 1 TABLET DAILY WITH YOUR OTHER BLOOD PRESSURE MEDICINE Jolinda Potter M, DO  Active   Ascorbic Acid  (VITAMIN C ) 1000 MG tablet [id number]  Take 1,000 mg by mouth in the morning. [provider]  Active Spouse/Significant Other, Pharmacy Records  CALCIUM  PO [id number]  Take 1,200 mg by mouth in the morning. [provider]  Active Spouse/Significant Other, Pharmacy Records  Carboxymethylcellulose Sodium 0.25 % SOLN [id number]  Place 1-2 drops into both eyes as needed (dry eyes). [provider]  Active Spouse/Significant Other, Pharmacy Records  Cholecalciferol  (VITAMIN D -3) 5000 UNITS TABS [id number]  Take 5,000 Units by mouth in the morning. [provider]  Active Spouse/Significant Other, Pharmacy Records  dapagliflozin  propanediol (FARXIGA ) 10 MG TABS tablet [id number]  Take 1 tablet (10 mg total) by mouth  daily. Jolinda Potter M, DO  Active    Patient not taking:   Discontinued 12/11/24 1156 (Completed Course)   Evolocumab  (REPATHA  SURECLICK) 140 MG/ML SOAJ [id number]  Inject 140 mg into the skin every 14 (fourteen) days. Jolinda Potter M, DO  Active   famotidine  (PEPCID ) 20 MG tablet [id number]  TAKE ONE TABLET BY MOUTH AT BEDTIME Zehr, Jessica D, PA-C  Active   fenofibrate  160 MG tablet [id number]  TAKE 1 TABLET BY MOUTH EVERY DAY Jolinda Potter M, DO  Active   glycerin adult 2 g suppository [id number]  Place 1 suppository rectally as needed for constipation. [provider]  Active Spouse/Significant Other, Pharmacy Records   Patient not taking:   Discontinued 12/11/24 1156 (Completed Course) irbesartan -hydrochlorothiazide  (AVALIDE) 300-12.5 MG tablet [id number]  Take 1 tablet by mouth daily. For BP. **dose change Jolinda Potter M, DO  Active   melatonin 10 MG TABS [id number]  Take 10 mg by mouth at bedtime. Raenelle Donalda HERO, MD  Active   memantine  (NAMENDA ) 5 MG tablet [id number]  TAKE 1 TABLET BY MOUTH TWICE A DAY Gottschalk, Ashly M, DO  Active   mirtazapine  (REMERON ) 45 MG tablet [id number]  Take 1 tablet (45 mg total) by mouth at bedtime. Barbra Jayson LABOR, MD  Active   montelukast  (SINGULAIR ) 10 MG tablet [id number]  TAKE ONE TABLET BY MOUTH DAILY Jolinda Potter M, DO  Active   ondansetron  (ZOFRAN -ODT) 4 MG disintegrating tablet [id number]  Take 1 tablet (4 mg total) by mouth every 8 (eight) hours as needed for nausea or vomiting. Sharl Selinda Dover, MD  Active Spouse/Significant Other, Pharmacy Records  pantoprazole  (PROTONIX ) 40 MG tablet [id number]  TAKE ONE TABLET TWICE DAILY BEFORE MEALS Jolinda Potter M, DO  Active   PROAIR  HFA 108 (90 BASE) MCG/ACT inhaler [id number]  Use 2 puffs into the lungs  every 6 hours as needed for wheezing or shortness of  breath  Patient taking differently: Inhale 2 puffs into the lungs every 6 (six) hours as needed for wheezing or shortness  of breath.   Zollie Lowers, MD  Active Spouse/Significant Other, Pharmacy Records           Med Note (LEE, NICOLE   Tue Jan 10, 2024 10:59 PM) Last dose unknown  sertraline  (ZOLOFT ) 50 MG tablet [id number]  Take 1 tablet (50 mg total) by mouth daily. Barbra Jayson LABOR, MD  Active              Assessment/Plan:   Hypertension: - Currently uncontrolled, BP controlled today today. Reported increased blood pressure 6 months ago when woke up with headache and nose bleed.  - Reviewed long term cardiovascular and renal outcomes of uncontrolled blood pressure - Reviewed appropriate blood pressure monitoring technique and reviewed goal blood pressure. Recommended to check home blood pressure and heart rate daily and with symptoms of high blood pressure. - Recommend to limit sodium consumption, eat heart healthy diet, plate method - Recommended to continue amlodipine  2.5 mg and irbesartan /hydrochlorothiazide  300-12.5 mg - Recommended to avoid NSAIDs. - Having cramps at night; encouraged hydration and monitoring  Hyperlipidemia/ASCVD Risk Reduction: - Currently uncontrolled.  - Reviewed long term complications of uncontrolled cholesterol - Reviewed dietary recommendations including heart healthy diet, plate method - Reviewed lifestyle recommendations including physical activity - Recommend to continue fenofibrate , start Repatha  with [employer]well grant patient assistance  Follow  Up Plan PharmD: 1 month to review patient assistance program and assess efficacy and safety of therapy.  Follow Up with Primary Care in 12/2024   Mliss Tarry Griffin, PharmD, BCACP, CPP Clinical Pharmacist, [facility]   "

## 2024-12-28 ENCOUNTER — Encounter: Payer: Self-pay | Admitting: Family Medicine

## 2024-12-28 ENCOUNTER — Ambulatory Visit: Payer: Self-pay | Admitting: Family Medicine

## 2024-12-28 VITALS — BP 134/56 | HR 54 | Temp 98.3°F | Ht 60.0 in | Wt 154.1 lb

## 2024-12-28 DIAGNOSIS — T466X5A Adverse effect of antihyperlipidemic and antiarteriosclerotic drugs, initial encounter: Secondary | ICD-10-CM

## 2024-12-28 DIAGNOSIS — M609 Myositis, unspecified: Secondary | ICD-10-CM | POA: Diagnosis not present

## 2024-12-28 DIAGNOSIS — E785 Hyperlipidemia, unspecified: Secondary | ICD-10-CM | POA: Diagnosis not present

## 2024-12-28 DIAGNOSIS — E1169 Type 2 diabetes mellitus with other specified complication: Secondary | ICD-10-CM | POA: Diagnosis not present

## 2024-12-28 DIAGNOSIS — E1159 Type 2 diabetes mellitus with other circulatory complications: Secondary | ICD-10-CM | POA: Diagnosis not present

## 2024-12-28 DIAGNOSIS — Z7984 Long term (current) use of oral hypoglycemic drugs: Secondary | ICD-10-CM

## 2024-12-28 DIAGNOSIS — R809 Proteinuria, unspecified: Secondary | ICD-10-CM | POA: Diagnosis not present

## 2024-12-28 DIAGNOSIS — Z23 Encounter for immunization: Secondary | ICD-10-CM

## 2024-12-28 DIAGNOSIS — I152 Hypertension secondary to endocrine disorders: Secondary | ICD-10-CM | POA: Diagnosis not present

## 2024-12-28 DIAGNOSIS — N289 Disorder of kidney and ureter, unspecified: Secondary | ICD-10-CM

## 2024-12-28 DIAGNOSIS — E1129 Type 2 diabetes mellitus with other diabetic kidney complication: Secondary | ICD-10-CM

## 2024-12-28 LAB — BAYER DCA HB A1C WAIVED: HB A1C (BAYER DCA - WAIVED): 5.3 % (ref 4.8–5.6)

## 2024-12-28 NOTE — Progress Notes (Signed)
 "  Subjective: CC:DM PCP: Melissa Melissa HERO, Melissa Bowman YEP:Djwimj F Swalley is a 74 y.o. female presenting to clinic today for:  Type 2 Diabetes with hypertension, hyperlipidemia with microalbuminuria and impaired renal function:  Compliant with Farxiga , Avalide, Norvasc , fenofibrate  and Repatha .  No reports of hypoglycemia, vaginitis.  No chest pain, shortness of breath or falls.  Blood pressure has been under much better control.  Diabetes Health Maintenance Due  Topic Date Due   OPHTHALMOLOGY EXAM  07/18/2024   HEMOGLOBIN A1C  03/07/2025   FOOT EXAM  09/07/2025    ROS: Per HPI  Allergies[1] Past Medical History:  Diagnosis Date   Allergic rhinitis    Allergy to ertapenem    Anxiety 08/27/2011   Anxiety and depression    Asthmatic bronchitis    Bilateral carpal tunnel syndrome    Condyloma acuminata    Diverticulosis    Eczema    GERD (gastroesophageal reflux disease)    H. pylori infection 11/16/1993   Hiatal hernia    History of benign adrenal tumor 2007   [facility]   History of pneumonia 2007, 2008   Hyperlipidemia    Hypertension    IBS (irritable bowel syndrome)    Internal hemorrhoids    Kyphosis    Long term current use of antipsychotic medication 08/19/2022   Memory loss    Menopause    Metabolic syndrome    OSA on CPAP    Prolonged Q-T interval on ECG 08/19/2022   Type 2 diabetes mellitus (HCC)    Vitamin D  deficiency    Current Medications[2] Social History   Socioeconomic History   Marital status: Married    Spouse name: Melissa Bowman   Number of children: 2   Years of education: some college   Highest education level: Some college, no degree  Occupational History   Occupation: retired    Comment: daycare  Tobacco Use   Smoking status: Never   Smokeless tobacco: Never  Vaping Use   Vaping status: Never Used  Substance and Sexual Activity   Alcohol  use: No   Drug use: No   Sexual activity: Not on file  Other Topics Concern   Not on file  Social  History Narrative   Melissa Bowman is retired. She lives with her husband Melissa Bowman. They have two grown daughters that live locally. Melissa Bowman enjoys reading and puzzles.       Social Drivers of Health   Tobacco Use: Low Risk (09/07/2024)   Patient History    Smoking Tobacco Use: Never    Smokeless Tobacco Use: Never    Passive Exposure: Not on file  Financial Resource Strain: Low Risk (04/25/2024)   Overall Financial Resource Strain (CARDIA)    Difficulty of Paying Living Expenses: Not hard at all  Food Insecurity: No Food Insecurity (10/24/2024)   Epic    Worried About Programme Researcher, Broadcasting/film/video in the Last Year: Never true    Ran Out of Food in the Last Year: Never true  Transportation Needs: No Transportation Needs (10/24/2024)   Epic    Lack of Transportation (Medical): No    Lack of Transportation (Non-Medical): No  Physical Activity: Insufficiently Active (04/25/2024)   Exercise Vital Sign    Days of Exercise per Week: 7 days    Minutes of Exercise per Session: 20 min  Stress: No Stress Concern Present (04/25/2024)   [employer] of Occupational Health - Occupational Stress Questionnaire    Feeling of Stress : Not at all  Social Connections: Moderately  Isolated (04/25/2024)   Social Connection and Isolation Panel    Frequency of Communication with Friends and Family: More than three times a week    Frequency of Social Gatherings with Friends and Family: More than three times a week    Attends Religious Services: Never    Database Administrator or Organizations: No    Attends Banker Meetings: Never    Marital Status: Married  Catering Manager Violence: Not At Risk (10/24/2024)   Epic    Fear of Current or Ex-Partner: No    Emotionally Abused: No    Physically Abused: No    Sexually Abused: No  Depression (PHQ2-9): Low Risk (10/24/2024)   Depression (PHQ2-9)    PHQ-2 Score: 0  Alcohol  Screen: Low Risk (04/25/2024)   Alcohol  Screen    Last Alcohol  Screening Score (AUDIT):  0  Housing: Low Risk (10/24/2024)   Epic    Unable to Pay for Housing in the Last Year: No    Number of Times Moved in the Last Year: 0    Homeless in the Last Year: No  Utilities: Not At Risk (10/24/2024)   Epic    Threatened with loss of utilities: No  Health Literacy: Adequate Health Literacy (04/25/2024)   B1300 Health Literacy    Frequency of need for help with medical instructions: Never   Family History  Problem Relation Age of Onset   Heart disease Mother    Parkinsonism Father    Emphysema Father    Hip fracture Father    Heart failure Sister    Ovarian cancer Sister    COPD Sister    Lymphoma Sister    COPD Sister    COPD Brother    Hypertension Brother    Heart disease Brother    COPD Brother    Colon cancer Neg Hx    Esophageal cancer Neg Hx    Pancreatic cancer Neg Hx    Stomach cancer Neg Hx    Breast cancer Neg Hx     Objective: Office vital signs reviewed. BP (!) 134/56   Pulse (!) 54   Temp 98.3 F (36.8 C)   Ht 5' (1.524 m)   Wt 154 lb 2 oz (69.9 kg)   SpO2 98%   BMI 30.10 kg/m   Physical Examination:  General: Awake, alert, well nourished female, No acute distress HEENT sclera white.  Moist mucous membranes Cardio: Slightly bradycardic with regular rhythm, S1S2 heard, no murmurs appreciated Pulm: clear to auscultation bilaterally, no wheezes, rhonchi or rales; normal work of breathing on room air Extremities: No edema     Lab Results  Component Value Date   HGBA1C 4.6 (L) 09/07/2024    Assessment/ Plan: 74 y.o. female   Hypertension associated with diabetes (HCC)  Hyperlipidemia associated with type 2 diabetes mellitus (HCC)  Type 2 diabetes mellitus with diabetic microalbuminuria, without long-term current use of insulin (HCC) - Plan: Bayer DCA Hb A1c Waived  Impaired renal function - Plan: Renal Function Panel  Statin-induced myositis  Blood pressure controlled upon recheck.  No changes.  Not due for fasting labs but will  check renal function, A1c.  Has history of statin induced myositis but is doing well on Repatha  and fenofibrate .  No changes.  Follow-up in 6 months for annual physical with fasting labs  Tetanus shot administered.  Eye exam scheduled.  Mammogram to be scheduled   Melissa CHRISTELLA Fielding, Melissa Bowman [facility] [phone]     [  1]  Allergies Allergen Reactions   Statins Other (See Comments)    myalgias   E-Mycin [Erythromycin] Other (See Comments)    Medication intolerance stomach cramps   Estrogens Other (See Comments)    Estrogen replacement Therapy intolerance mood changes   Sulfa Antibiotics Other (See Comments)    Causes severe stomach cramps    Vioxx [Rofecoxib] Other (See Comments)    Unknown reaction/interaction. Possible stomach cramps  [2]  Current Outpatient Medications:    amLODipine  (NORVASC ) 2.5 MG tablet, TAKE 1 TABLET DAILY WITH YOUR OTHER BLOOD PRESSURE MEDICINE, Disp: 90 tablet, Rfl: 0   Ascorbic Acid  (VITAMIN C ) 1000 MG tablet, Take 1,000 mg by mouth in the morning., Disp: , Rfl:    CALCIUM  PO, Take 1,200 mg by mouth in the morning., Disp: , Rfl:    Carboxymethylcellulose Sodium 0.25 % SOLN, Place 1-2 drops into both eyes as needed (dry eyes)., Disp: , Rfl:    Cholecalciferol  (VITAMIN D -3) 5000 UNITS TABS, Take 5,000 Units by mouth in the morning., Disp: , Rfl:    dapagliflozin  propanediol (FARXIGA ) 10 MG TABS tablet, Take 1 tablet (10 mg total) by mouth daily., Disp: 90 tablet, Rfl: 3   Evolocumab  (REPATHA  SURECLICK) 140 MG/ML SOAJ, Inject 140 mg into the skin every 14 (fourteen) days., Disp: 2 mL, Rfl: 2   famotidine  (PEPCID ) 20 MG tablet, TAKE ONE TABLET BY MOUTH AT BEDTIME, Disp: 90 tablet, Rfl: 1   fenofibrate  160 MG tablet, TAKE 1 TABLET BY MOUTH EVERY DAY, Disp: 90 tablet, Rfl: 0   glycerin adult 2 g suppository, Place 1 suppository rectally as needed for constipation., Disp: , Rfl:    irbesartan -hydrochlorothiazide  (AVALIDE) 300-12.5 MG  tablet, Take 1 tablet by mouth daily. For BP. **dose change, Disp: 90 tablet, Rfl: 3   melatonin 10 MG TABS, Take 10 mg by mouth at bedtime., Disp: , Rfl:    memantine  (NAMENDA ) 5 MG tablet, TAKE 1 TABLET BY MOUTH TWICE A DAY, Disp: 180 tablet, Rfl: 1   mirtazapine  (REMERON ) 45 MG tablet, Take 1 tablet (45 mg total) by mouth at bedtime., Disp: 90 tablet, Rfl: 2   montelukast  (SINGULAIR ) 10 MG tablet, TAKE ONE TABLET BY MOUTH DAILY, Disp: 100 tablet, Rfl: 1   ondansetron  (ZOFRAN -ODT) 4 MG disintegrating tablet, Take 1 tablet (4 mg total) by mouth every 8 (eight) hours as needed for nausea or vomiting., Disp: 20 tablet, Rfl: 0   pantoprazole  (PROTONIX ) 40 MG tablet, TAKE ONE TABLET TWICE DAILY BEFORE MEALS, Disp: 60 tablet, Rfl: 2   PROAIR  HFA 108 (90 BASE) MCG/ACT inhaler, Use 2 puffs into the lungs  every 6 hours as needed for wheezing or shortness of  breath (Patient taking differently: Inhale 2 puffs into the lungs every 6 (six) hours as needed for wheezing or shortness of breath.), Disp: 25.5 g, Rfl: 2   sertraline  (ZOLOFT ) 50 MG tablet, Take 1 tablet (50 mg total) by mouth daily., Disp: 90 tablet, Rfl: 2  "

## 2024-12-28 NOTE — Addendum Note (Signed)
 Addended by: SHERRE SUZEN PARAS on: 12/28/2024 02:18 PM   Modules accepted: Orders

## 2024-12-29 LAB — RENAL FUNCTION PANEL
Albumin: 4.5 g/dL (ref 3.8–4.8)
BUN/Creatinine Ratio: 18 (ref 12–28)
BUN: 27 mg/dL (ref 8–27)
CO2: 23 mmol/L (ref 20–29)
Calcium: 9.4 mg/dL (ref 8.7–10.3)
Chloride: 101 mmol/L (ref 96–106)
Creatinine, Ser: 1.51 mg/dL — ABNORMAL HIGH (ref 0.57–1.00)
Glucose: 86 mg/dL (ref 70–99)
Phosphorus: 4 mg/dL (ref 3.0–4.3)
Potassium: 4.6 mmol/L (ref 3.5–5.2)
Sodium: 139 mmol/L (ref 134–144)
eGFR: 36 mL/min/1.73 — ABNORMAL LOW

## 2024-12-31 ENCOUNTER — Encounter: Payer: Self-pay | Admitting: Family Medicine

## 2024-12-31 ENCOUNTER — Ambulatory Visit: Payer: Self-pay | Admitting: Family Medicine

## 2024-12-31 DIAGNOSIS — G720 Drug-induced myopathy: Secondary | ICD-10-CM | POA: Insufficient documentation

## 2024-12-31 DIAGNOSIS — N289 Disorder of kidney and ureter, unspecified: Secondary | ICD-10-CM

## 2024-12-31 NOTE — Telephone Encounter (Signed)
 Duplicate encounter

## 2025-01-14 ENCOUNTER — Other Ambulatory Visit: Payer: Self-pay | Admitting: Family Medicine

## 2025-01-15 ENCOUNTER — Other Ambulatory Visit (HOSPITAL_COMMUNITY): Payer: Self-pay | Admitting: Nephrology

## 2025-01-15 DIAGNOSIS — N1832 Chronic kidney disease, stage 3b: Secondary | ICD-10-CM

## 2025-01-15 DIAGNOSIS — R809 Proteinuria, unspecified: Secondary | ICD-10-CM

## 2025-01-17 ENCOUNTER — Other Ambulatory Visit: Admitting: Pharmacist

## 2025-01-17 DIAGNOSIS — E119 Type 2 diabetes mellitus without complications: Secondary | ICD-10-CM

## 2025-01-17 NOTE — Progress Notes (Unsigned)
 Filling repatha  GFR worsening Patient to see [facility] for renal Holding farxiga  for now    12/11/2024 Name: Melissa Bowman MRN: [id number] DOB: 29-Nov-1951  No chief complaint on file.   Melissa Bowman is a 74 y.o. year old female who was referred for medication management by their primary care provider, Jolinda Potter M, DO. They presented for a face to face visit today.   They were referred to the pharmacist by their PCP for assistance in managing hyperlipidemia/cardiovascular risk reduction and medication access    Subjective:  Care Team: Primary Care Provider: Jolinda Potter HERO, DO ; Next Scheduled Visit: 03/15/2025 Neurologist: Whitfield Raisin, NP; Next Scheduled Visit: 03/11/2025  Medication Access/Adherence  Current Pharmacy:  [facility] [place], [place] - [address] [place] [address] Phone: [phone] Fax: [phone]  [id number] - [place], [place] - [address] [place] [address] Phone: [phone] Fax: [phone]  [facility] - [place], [place] - [address] [place] [address] Phone: [phone] Fax: [phone]   Patient reports affordability concerns with their medications: Yes  Patient reports access/transportation concerns to their pharmacy: No  Patient reports adherence concerns with their medications:  No  Takes medications at 7 am each morning.   Hypertension:  Current medications: Amlodipine , Irbesartan /HCTZ Medications previously tried: checks at home, never below 148, no swelling   Patient has a validated, automated, upper arm home BP cuff Current blood pressure home readings: SBP never below 148 per patient and husband  Patient denies hypotensive s/sx including dizziness, lightheadedness.  Patient denieshypertensive symptoms including headache.  Hyperlipidemia/ASCVD Risk Reduction  Current lipid lowering medications: Fenofibrate  Medications  tried in the past: Statins with muscle pain  Antiplatelet regimen: None  ASCVD History: None Family History: N/A Risk Factors: T2D, HTN, HLD  Current medication access support: Initiate Repatha  assistance  PREVENT Risk Score:  omesothelioma.fr - 10 year risk of CVD: 30.9% - 10 year risk of ASCVD: 17.2% - 10 year risk of HF: 16.2%  Objective:  Lab Results  Component Value Date   HGBA1C 5.3 12/28/2024    Lab Results  Component Value Date   CREATININE 1.51 (H) 12/28/2024   BUN 27 12/28/2024   NA 139 12/28/2024   K 4.6 12/28/2024   CL 101 12/28/2024   CO2 23 12/28/2024    Lab Results  Component Value Date   CHOL 172 03/07/2024   HDL 41 03/07/2024   LDLCALC 110 (H) 03/07/2024   TRIG 117 03/07/2024   CHOLHDL 4.2 03/07/2024    Medications Reviewed Today   Medications were not reviewed in this encounter      Assessment/Plan:   Hypertension: - Currently uncontrolled, BP controlled today today. Reported increased blood pressure 6 months ago when woke up with headache and nose bleed.  - Reviewed long term cardiovascular and renal outcomes of uncontrolled blood pressure - Reviewed appropriate blood pressure monitoring technique and reviewed goal blood pressure. Recommended to check home blood pressure and heart rate daily and with symptoms of high blood pressure. - Recommend to limit sodium consumption, eat heart healthy diet, plate method - Recommended to continue amlodipine  2.5 mg and irbesartan /hydrochlorothiazide  300-12.5 mg - Recommended to avoid NSAIDs. - Having cramps at night; encouraged hydration and monitoring  Hyperlipidemia/ASCVD Risk Reduction: - Currently uncontrolled.  - Reviewed long term complications of uncontrolled cholesterol - Reviewed dietary recommendations including heart healthy diet, plate method -  Reviewed lifestyle recommendations including physical  activity - Recommend to continue fenofibrate , CONTINUE Repatha  with healthwell grant patient assistance  Follow Up Plan:PharmD: 2 months to review patient assistance program and assess efficacy and safety of therapy.  Follow Up with Primary Care in 12/2024   Mliss Tarry Griffin, PharmD, BCACP, CPP Clinical Pharmacist, [facility]

## 2025-01-22 ENCOUNTER — Ambulatory Visit (HOSPITAL_COMMUNITY)

## 2025-01-28 ENCOUNTER — Ambulatory Visit (HOSPITAL_COMMUNITY)

## 2025-03-11 ENCOUNTER — Ambulatory Visit: Admitting: Adult Health

## 2025-03-15 ENCOUNTER — Encounter: Payer: Self-pay | Admitting: Family Medicine

## 2025-04-03 ENCOUNTER — Ambulatory Visit

## 2025-06-03 ENCOUNTER — Ambulatory Visit (HOSPITAL_COMMUNITY): Admitting: Registered Nurse

## 2025-07-01 ENCOUNTER — Ambulatory Visit: Admitting: Family Medicine
